# Patient Record
Sex: Male | Born: 1960
Health system: Southern US, Community
[De-identification: ages and names within clinical notes are randomized; demographics above are authoritative.]

## PROBLEM LIST (undated history)

## (undated) ENCOUNTER — Emergency Department (HOSPITAL_COMMUNITY): Discharge status: Absent without leave | Payer: Self-pay

## (undated) DIAGNOSIS — Z972 Presence of dental prosthetic device (complete) (partial): Secondary | ICD-10-CM

## (undated) DIAGNOSIS — Z9889 Other specified postprocedural states: Secondary | ICD-10-CM

## (undated) DIAGNOSIS — M51379 Other intervertebral disc degeneration, lumbosacral region without mention of lumbar back pain or lower extremity pain: Secondary | ICD-10-CM

## (undated) DIAGNOSIS — Z973 Presence of spectacles and contact lenses: Secondary | ICD-10-CM

## (undated) DIAGNOSIS — R918 Other nonspecific abnormal finding of lung field: Secondary | ICD-10-CM

## (undated) DIAGNOSIS — Z8619 Personal history of other infectious and parasitic diseases: Secondary | ICD-10-CM

## (undated) DIAGNOSIS — M5414 Radiculopathy, thoracic region: Secondary | ICD-10-CM

## (undated) DIAGNOSIS — Z8679 Personal history of other diseases of the circulatory system: Secondary | ICD-10-CM

## (undated) DIAGNOSIS — Z8719 Personal history of other diseases of the digestive system: Secondary | ICD-10-CM

## (undated) DIAGNOSIS — M5137 Other intervertebral disc degeneration, lumbosacral region: Secondary | ICD-10-CM

## (undated) DIAGNOSIS — D589 Hereditary hemolytic anemia, unspecified: Secondary | ICD-10-CM

## (undated) DIAGNOSIS — C61 Malignant neoplasm of prostate: Secondary | ICD-10-CM

## (undated) DIAGNOSIS — Z954 Presence of other heart-valve replacement: Secondary | ICD-10-CM

## (undated) HISTORY — DX: Other specified postprocedural states: Z98.890

## (undated) HISTORY — DX: Hereditary hemolytic anemia, unspecified: D58.9

## (undated) HISTORY — PX: COLONOSCOPY: SHX174

## (undated) HISTORY — PX: TRANSTHORACIC ECHOCARDIOGRAM: SHX275

## (undated) HISTORY — PX: CARDIAC CATHETERIZATION: SHX172

---

## 1999-10-23 ENCOUNTER — Encounter: Payer: Self-pay | Admitting: Internal Medicine

## 1999-10-23 ENCOUNTER — Inpatient Hospital Stay (HOSPITAL_COMMUNITY): Admission: EM | Admit: 1999-10-23 | Discharge: 1999-11-02 | Payer: Self-pay | Admitting: Internal Medicine

## 1999-10-29 ENCOUNTER — Encounter: Payer: Self-pay | Admitting: Internal Medicine

## 1999-10-30 ENCOUNTER — Encounter: Payer: Self-pay | Admitting: Urology

## 1999-11-02 ENCOUNTER — Encounter: Payer: Self-pay | Admitting: Internal Medicine

## 2002-09-19 ENCOUNTER — Encounter: Payer: Self-pay | Admitting: Emergency Medicine

## 2002-09-19 ENCOUNTER — Emergency Department (HOSPITAL_COMMUNITY): Admission: EM | Admit: 2002-09-19 | Discharge: 2002-09-19 | Payer: Self-pay | Admitting: Emergency Medicine

## 2004-05-25 ENCOUNTER — Ambulatory Visit: Payer: Self-pay | Admitting: Internal Medicine

## 2004-07-23 ENCOUNTER — Ambulatory Visit: Payer: Self-pay | Admitting: Internal Medicine

## 2004-07-30 ENCOUNTER — Ambulatory Visit: Payer: Self-pay | Admitting: Internal Medicine

## 2004-09-17 ENCOUNTER — Emergency Department (HOSPITAL_COMMUNITY): Admission: EM | Admit: 2004-09-17 | Discharge: 2004-09-17 | Payer: Self-pay | Admitting: Emergency Medicine

## 2005-01-24 ENCOUNTER — Ambulatory Visit: Payer: Self-pay | Admitting: Internal Medicine

## 2005-06-04 ENCOUNTER — Ambulatory Visit: Payer: Self-pay | Admitting: Internal Medicine

## 2005-08-23 ENCOUNTER — Ambulatory Visit: Payer: Self-pay | Admitting: Internal Medicine

## 2005-11-11 ENCOUNTER — Ambulatory Visit: Payer: Self-pay | Admitting: Internal Medicine

## 2005-11-27 ENCOUNTER — Ambulatory Visit: Payer: Self-pay | Admitting: Internal Medicine

## 2006-03-04 ENCOUNTER — Ambulatory Visit: Payer: Self-pay | Admitting: Internal Medicine

## 2007-03-02 ENCOUNTER — Ambulatory Visit: Payer: Self-pay | Admitting: Internal Medicine

## 2007-03-02 DIAGNOSIS — I38 Endocarditis, valve unspecified: Secondary | ICD-10-CM | POA: Insufficient documentation

## 2007-04-01 ENCOUNTER — Telehealth (INDEPENDENT_AMBULATORY_CARE_PROVIDER_SITE_OTHER): Payer: Self-pay | Admitting: *Deleted

## 2007-04-10 ENCOUNTER — Ambulatory Visit: Payer: Self-pay | Admitting: Internal Medicine

## 2007-05-29 ENCOUNTER — Emergency Department (HOSPITAL_COMMUNITY): Admission: EM | Admit: 2007-05-29 | Discharge: 2007-05-29 | Payer: Self-pay | Admitting: Emergency Medicine

## 2007-06-03 ENCOUNTER — Ambulatory Visit: Payer: Self-pay

## 2007-06-03 ENCOUNTER — Encounter: Payer: Self-pay | Admitting: Internal Medicine

## 2007-06-06 ENCOUNTER — Encounter: Payer: Self-pay | Admitting: Internal Medicine

## 2007-06-08 ENCOUNTER — Encounter (INDEPENDENT_AMBULATORY_CARE_PROVIDER_SITE_OTHER): Payer: Self-pay | Admitting: *Deleted

## 2007-06-11 ENCOUNTER — Ambulatory Visit: Payer: Self-pay | Admitting: Cardiology

## 2007-09-24 ENCOUNTER — Ambulatory Visit: Payer: Self-pay | Admitting: Internal Medicine

## 2008-02-08 ENCOUNTER — Ambulatory Visit: Payer: Self-pay | Admitting: Internal Medicine

## 2008-02-09 ENCOUNTER — Ambulatory Visit: Payer: Self-pay | Admitting: Internal Medicine

## 2008-02-10 ENCOUNTER — Telehealth (INDEPENDENT_AMBULATORY_CARE_PROVIDER_SITE_OTHER): Payer: Self-pay | Admitting: *Deleted

## 2008-02-10 LAB — CONVERTED CEMR LAB
BUN: 11 mg/dL (ref 6–23)
Basophils Absolute: 0 10*3/uL (ref 0.0–0.1)
Basophils Relative: 0.5 % (ref 0.0–3.0)
CO2: 27 meq/L (ref 19–32)
Calcium: 8.6 mg/dL (ref 8.4–10.5)
Chloride: 105 meq/L (ref 96–112)
Creatinine, Ser: 1.4 mg/dL (ref 0.4–1.5)
Eosinophils Absolute: 0 10*3/uL (ref 0.0–0.7)
Eosinophils Relative: 0.3 % (ref 0.0–5.0)
GFR calc Af Amer: 70 mL/min
GFR calc non Af Amer: 57 mL/min
Glucose, Bld: 89 mg/dL (ref 70–99)
HCT: 31.5 % — ABNORMAL LOW (ref 39.0–52.0)
Hemoglobin: 10.5 g/dL — ABNORMAL LOW (ref 13.0–17.0)
Lymphocytes Relative: 19.2 % (ref 12.0–46.0)
MCHC: 33.4 g/dL (ref 30.0–36.0)
MCV: 88.7 fL (ref 78.0–100.0)
Monocytes Absolute: 0.7 10*3/uL (ref 0.1–1.0)
Monocytes Relative: 11.2 % (ref 3.0–12.0)
Neutro Abs: 4.4 10*3/uL (ref 1.4–7.7)
Neutrophils Relative %: 68.8 % (ref 43.0–77.0)
Platelets: 333 10*3/uL (ref 150–400)
Potassium: 4 meq/L (ref 3.5–5.1)
RBC: 3.55 M/uL — ABNORMAL LOW (ref 4.22–5.81)
RDW: 13.3 % (ref 11.5–14.6)
Sodium: 137 meq/L (ref 135–145)
WBC: 6.3 10*3/uL (ref 4.5–10.5)

## 2008-02-11 ENCOUNTER — Encounter (INDEPENDENT_AMBULATORY_CARE_PROVIDER_SITE_OTHER): Payer: Self-pay | Admitting: *Deleted

## 2008-02-11 ENCOUNTER — Emergency Department (HOSPITAL_COMMUNITY): Admission: EM | Admit: 2008-02-11 | Discharge: 2008-02-11 | Payer: Self-pay | Admitting: *Deleted

## 2008-02-16 ENCOUNTER — Telehealth (INDEPENDENT_AMBULATORY_CARE_PROVIDER_SITE_OTHER): Payer: Self-pay | Admitting: *Deleted

## 2008-02-16 DIAGNOSIS — IMO0002 Reserved for concepts with insufficient information to code with codable children: Secondary | ICD-10-CM | POA: Insufficient documentation

## 2008-02-17 ENCOUNTER — Emergency Department (HOSPITAL_COMMUNITY): Admission: EM | Admit: 2008-02-17 | Discharge: 2008-02-17 | Payer: Self-pay | Admitting: Emergency Medicine

## 2008-02-18 ENCOUNTER — Telehealth (INDEPENDENT_AMBULATORY_CARE_PROVIDER_SITE_OTHER): Payer: Self-pay | Admitting: *Deleted

## 2008-02-22 ENCOUNTER — Encounter: Payer: Self-pay | Admitting: Internal Medicine

## 2008-03-08 ENCOUNTER — Ambulatory Visit: Payer: Self-pay | Admitting: Internal Medicine

## 2008-03-08 DIAGNOSIS — D649 Anemia, unspecified: Secondary | ICD-10-CM | POA: Insufficient documentation

## 2008-03-09 ENCOUNTER — Telehealth (INDEPENDENT_AMBULATORY_CARE_PROVIDER_SITE_OTHER): Payer: Self-pay | Admitting: *Deleted

## 2008-03-09 ENCOUNTER — Encounter (INDEPENDENT_AMBULATORY_CARE_PROVIDER_SITE_OTHER): Payer: Self-pay | Admitting: *Deleted

## 2008-03-09 ENCOUNTER — Ambulatory Visit: Payer: Self-pay | Admitting: Internal Medicine

## 2008-03-09 LAB — CONVERTED CEMR LAB
ALT: 16 units/L (ref 0–53)
AST: 13 units/L (ref 0–37)
Albumin: 2.7 g/dL — ABNORMAL LOW (ref 3.5–5.2)
Alkaline Phosphatase: 64 units/L (ref 39–117)
Basophils Absolute: 0 10*3/uL (ref 0.0–0.1)
Basophils Relative: 0.5 % (ref 0.0–3.0)
Bilirubin, Direct: 0.1 mg/dL (ref 0.0–0.3)
Eosinophils Absolute: 0 10*3/uL (ref 0.0–0.7)
Eosinophils Relative: 0.5 % (ref 0.0–5.0)
Folate: 6.6 ng/mL
HCT: 32.5 % — ABNORMAL LOW (ref 39.0–52.0)
Hemoglobin: 10.7 g/dL — ABNORMAL LOW (ref 13.0–17.0)
Iron: 32 ug/dL — ABNORMAL LOW (ref 42–165)
Lymphocytes Relative: 17.3 % (ref 12.0–46.0)
MCHC: 32.9 g/dL (ref 30.0–36.0)
MCV: 85.1 fL (ref 78.0–100.0)
Monocytes Absolute: 0.5 10*3/uL (ref 0.1–1.0)
Monocytes Relative: 6.8 % (ref 3.0–12.0)
Neutro Abs: 6 10*3/uL (ref 1.4–7.7)
Neutrophils Relative %: 74.9 % (ref 43.0–77.0)
Platelets: 261 10*3/uL (ref 150–400)
RBC: 3.82 M/uL — ABNORMAL LOW (ref 4.22–5.81)
RDW: 14.4 % (ref 11.5–14.6)
Saturation Ratios: 12.5 % — ABNORMAL LOW (ref 20.0–50.0)
Total Bilirubin: 0.6 mg/dL (ref 0.3–1.2)
Total Protein: 7 g/dL (ref 6.0–8.3)
Transferrin: 183.3 mg/dL — ABNORMAL LOW (ref >=212.0)
Vitamin B-12: 397 pg/mL (ref 211–911)
WBC: 7.8 10*3/uL (ref 4.5–10.5)

## 2008-03-11 ENCOUNTER — Ambulatory Visit: Payer: Self-pay | Admitting: Cardiology

## 2008-03-11 ENCOUNTER — Inpatient Hospital Stay (HOSPITAL_COMMUNITY): Admission: EM | Admit: 2008-03-11 | Discharge: 2008-03-26 | Payer: Self-pay | Admitting: Cardiology

## 2008-03-12 ENCOUNTER — Ambulatory Visit: Payer: Self-pay | Admitting: Infectious Diseases

## 2008-03-12 ENCOUNTER — Encounter: Payer: Self-pay | Admitting: Cardiology

## 2008-03-14 ENCOUNTER — Encounter: Payer: Self-pay | Admitting: Internal Medicine

## 2008-03-14 ENCOUNTER — Encounter: Payer: Self-pay | Admitting: Thoracic Surgery (Cardiothoracic Vascular Surgery)

## 2008-03-15 ENCOUNTER — Telehealth (INDEPENDENT_AMBULATORY_CARE_PROVIDER_SITE_OTHER): Payer: Self-pay | Admitting: *Deleted

## 2008-03-15 ENCOUNTER — Ambulatory Visit: Payer: Self-pay | Admitting: Dentistry

## 2008-03-15 ENCOUNTER — Encounter: Payer: Self-pay | Admitting: Thoracic Surgery (Cardiothoracic Vascular Surgery)

## 2008-03-15 ENCOUNTER — Ambulatory Visit: Payer: Self-pay | Admitting: Thoracic Surgery (Cardiothoracic Vascular Surgery)

## 2008-03-16 ENCOUNTER — Ambulatory Visit: Payer: Self-pay | Admitting: Dentistry

## 2008-03-16 HISTORY — PX: MULTIPLE TOOTH EXTRACTIONS: SHX2053

## 2008-03-22 ENCOUNTER — Encounter: Payer: Self-pay | Admitting: Thoracic Surgery (Cardiothoracic Vascular Surgery)

## 2008-03-22 HISTORY — PX: MITRAL VALVE REPAIR: SHX2039

## 2008-03-30 ENCOUNTER — Ambulatory Visit: Payer: Self-pay | Admitting: Internal Medicine

## 2008-03-31 ENCOUNTER — Inpatient Hospital Stay (HOSPITAL_COMMUNITY): Admission: EM | Admit: 2008-03-31 | Discharge: 2008-04-02 | Payer: Self-pay | Admitting: Emergency Medicine

## 2008-04-01 ENCOUNTER — Encounter (INDEPENDENT_AMBULATORY_CARE_PROVIDER_SITE_OTHER): Payer: Self-pay | Admitting: Emergency Medicine

## 2008-04-04 ENCOUNTER — Ambulatory Visit: Payer: Self-pay | Admitting: Thoracic Surgery (Cardiothoracic Vascular Surgery)

## 2008-04-04 ENCOUNTER — Encounter
Admission: RE | Admit: 2008-04-04 | Discharge: 2008-04-04 | Payer: Self-pay | Admitting: Thoracic Surgery (Cardiothoracic Vascular Surgery)

## 2008-04-07 ENCOUNTER — Ambulatory Visit: Payer: Self-pay | Admitting: Cardiology

## 2008-04-08 ENCOUNTER — Ambulatory Visit: Payer: Self-pay | Admitting: Internal Medicine

## 2008-04-18 ENCOUNTER — Ambulatory Visit: Payer: Self-pay | Admitting: Cardiology

## 2008-04-18 LAB — CONVERTED CEMR LAB
INR: 4.4 — ABNORMAL HIGH (ref 0.8–1.0)
Prothrombin Time: 44 s — ABNORMAL HIGH (ref 10.9–13.3)

## 2008-04-25 ENCOUNTER — Encounter: Payer: Self-pay | Admitting: Internal Medicine

## 2008-04-28 ENCOUNTER — Ambulatory Visit: Payer: Self-pay | Admitting: Internal Medicine

## 2008-05-02 ENCOUNTER — Encounter: Payer: Self-pay | Admitting: Internal Medicine

## 2008-05-09 ENCOUNTER — Ambulatory Visit: Payer: Self-pay | Admitting: Cardiology

## 2008-05-23 ENCOUNTER — Ambulatory Visit: Payer: Self-pay | Admitting: Thoracic Surgery (Cardiothoracic Vascular Surgery)

## 2008-06-29 DIAGNOSIS — I059 Rheumatic mitral valve disease, unspecified: Secondary | ICD-10-CM | POA: Insufficient documentation

## 2008-07-15 ENCOUNTER — Encounter: Payer: Self-pay | Admitting: Cardiology

## 2008-07-15 ENCOUNTER — Ambulatory Visit: Payer: Self-pay | Admitting: Cardiology

## 2008-09-27 ENCOUNTER — Encounter: Payer: Self-pay | Admitting: *Deleted

## 2008-10-17 ENCOUNTER — Telehealth (INDEPENDENT_AMBULATORY_CARE_PROVIDER_SITE_OTHER): Payer: Self-pay | Admitting: *Deleted

## 2008-10-18 ENCOUNTER — Ambulatory Visit: Payer: Self-pay | Admitting: Cardiology

## 2008-11-02 ENCOUNTER — Encounter: Payer: Self-pay | Admitting: *Deleted

## 2008-11-23 ENCOUNTER — Encounter: Payer: Self-pay | Admitting: Cardiology

## 2008-11-23 ENCOUNTER — Ambulatory Visit: Payer: Self-pay | Admitting: Thoracic Surgery (Cardiothoracic Vascular Surgery)

## 2009-02-08 ENCOUNTER — Ambulatory Visit: Payer: Self-pay | Admitting: Internal Medicine

## 2009-03-04 IMAGING — CR DG CHEST 2V
2 series · 2 of 2 positions shown · non-contrast
Comparison: 03/09/2008.

CLINICAL DATA: Fever.  History of endocarditis.

CHEST - 2 VIEW

[w chest pa]
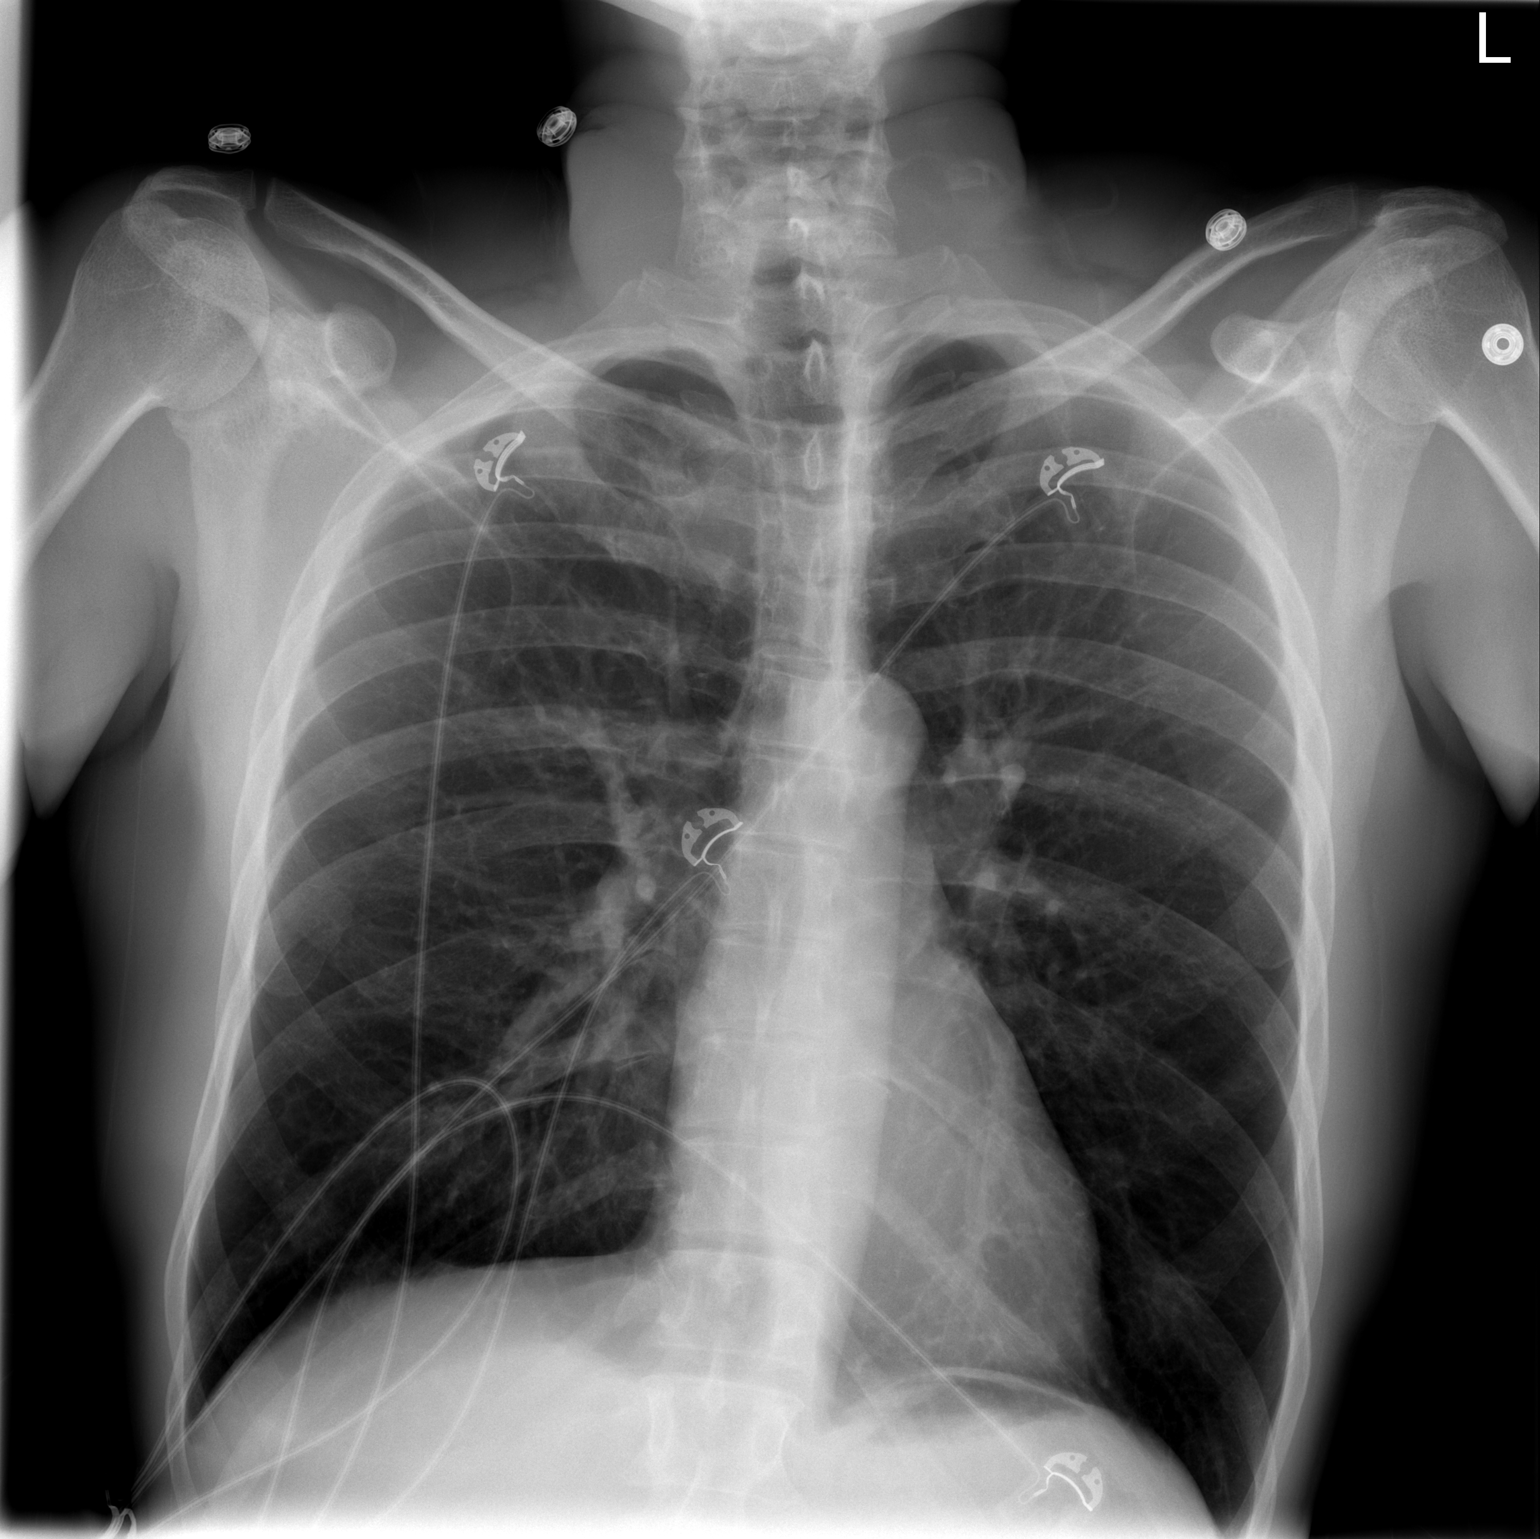

[w chest lat]
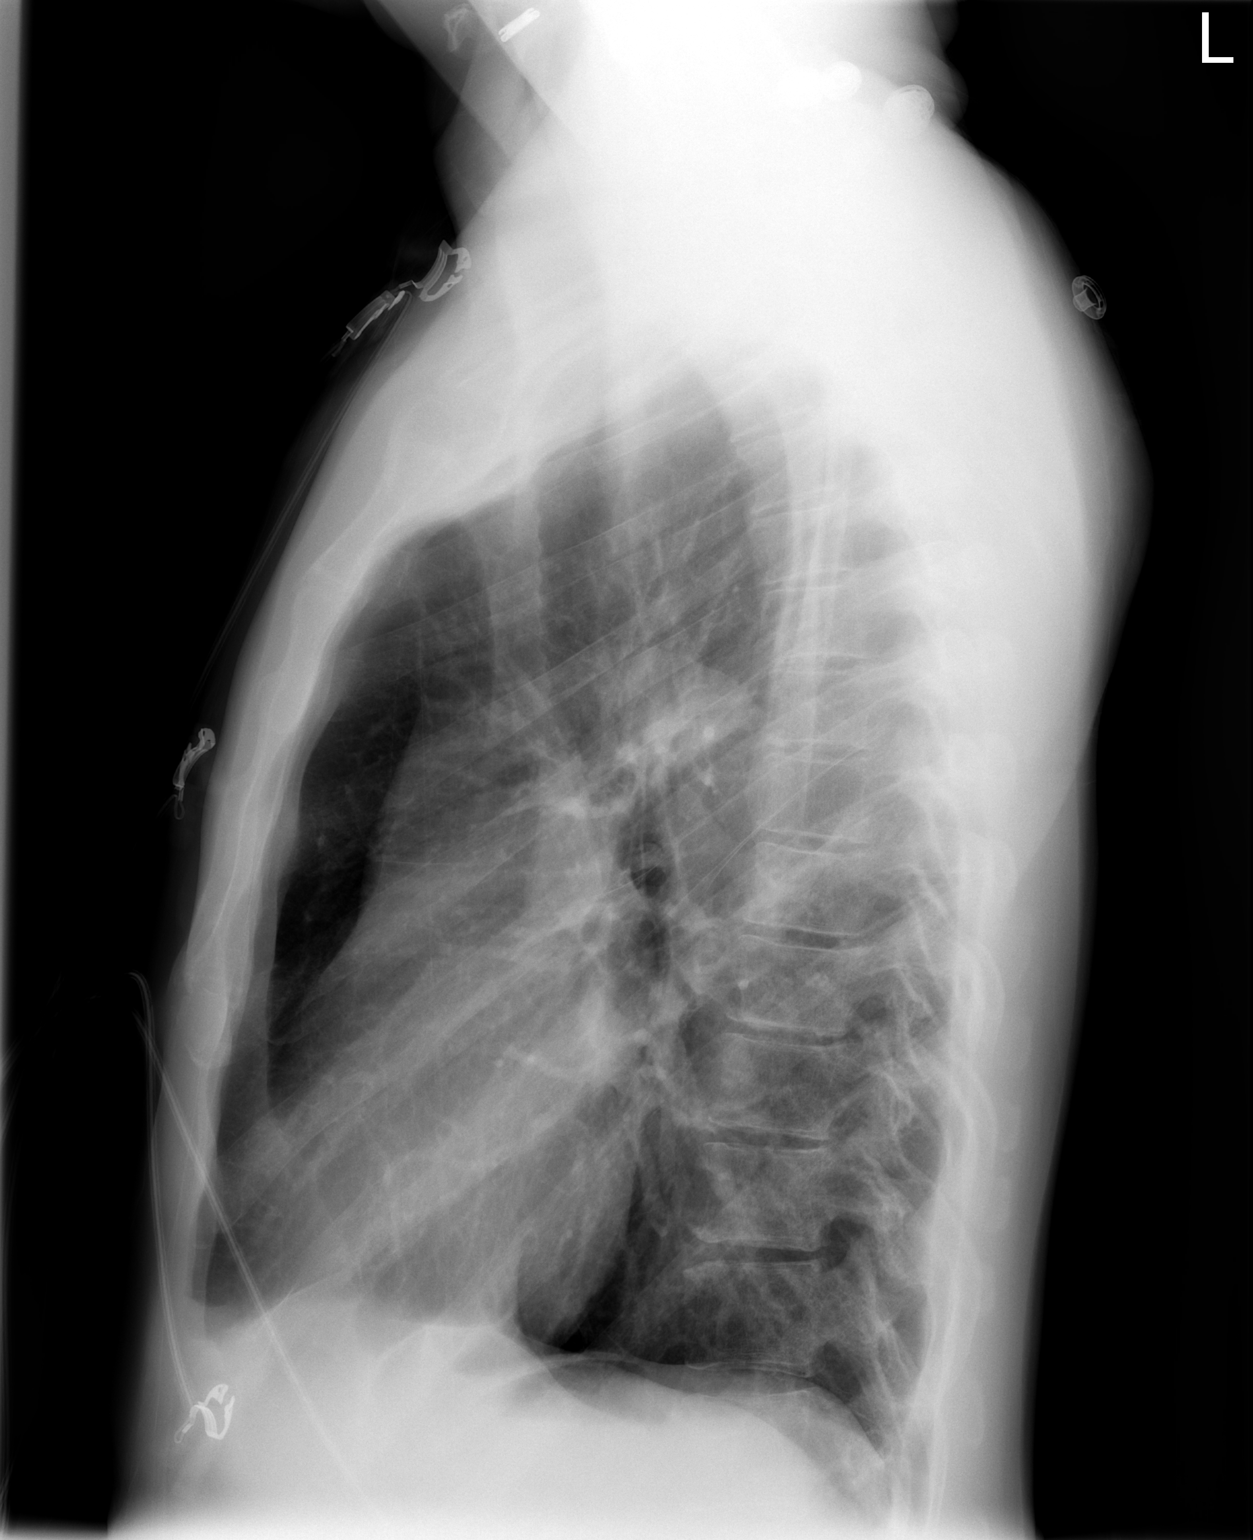

[2 of 2 positions shown; findings below may reference images not displayed]

FINDINGS: Stable normal sized heart and clear lungs.  The lungs
remain hyperexpanded.  Minimal scoliosis.
IMPRESSION: Stable mild changes of COPD.  No acute abnormality.

## 2009-03-16 ENCOUNTER — Ambulatory Visit: Payer: Self-pay | Admitting: Internal Medicine

## 2009-05-04 ENCOUNTER — Ambulatory Visit: Payer: Self-pay | Admitting: Internal Medicine

## 2009-05-12 ENCOUNTER — Ambulatory Visit: Payer: Self-pay | Admitting: Internal Medicine

## 2009-05-12 DIAGNOSIS — K449 Diaphragmatic hernia without obstruction or gangrene: Secondary | ICD-10-CM | POA: Insufficient documentation

## 2009-06-16 ENCOUNTER — Telehealth (INDEPENDENT_AMBULATORY_CARE_PROVIDER_SITE_OTHER): Payer: Self-pay | Admitting: *Deleted

## 2009-10-19 ENCOUNTER — Ambulatory Visit: Payer: Self-pay | Admitting: Cardiology

## 2009-11-03 ENCOUNTER — Ambulatory Visit (HOSPITAL_COMMUNITY): Admission: RE | Admit: 2009-11-03 | Discharge: 2009-11-03 | Payer: Self-pay | Admitting: Cardiology

## 2009-11-03 ENCOUNTER — Ambulatory Visit: Payer: Self-pay | Admitting: Cardiovascular Disease

## 2009-11-03 ENCOUNTER — Ambulatory Visit: Payer: Self-pay

## 2009-12-13 ENCOUNTER — Ambulatory Visit: Payer: Self-pay | Admitting: Internal Medicine

## 2010-01-12 ENCOUNTER — Ambulatory Visit: Payer: Self-pay | Admitting: Internal Medicine

## 2010-01-12 DIAGNOSIS — E785 Hyperlipidemia, unspecified: Secondary | ICD-10-CM | POA: Insufficient documentation

## 2010-01-12 DIAGNOSIS — K219 Gastro-esophageal reflux disease without esophagitis: Secondary | ICD-10-CM | POA: Insufficient documentation

## 2010-01-12 LAB — CONVERTED CEMR LAB
Cholesterol, target level: 200 mg/dL
HDL goal, serum: 40 mg/dL
LDL Goal: 160 mg/dL

## 2010-01-15 LAB — CONVERTED CEMR LAB
ALT: 28 units/L (ref 0–53)
AST: 35 units/L (ref 0–37)
Albumin: 4.7 g/dL (ref 3.5–5.2)
Alkaline Phosphatase: 49 units/L (ref 39–117)
BUN: 11 mg/dL (ref 6–23)
Basophils Absolute: 0 10*3/uL (ref 0.0–0.1)
Basophils Relative: 0.5 % (ref 0.0–3.0)
Bilirubin, Direct: 0.2 mg/dL (ref 0.0–0.3)
CO2: 30 meq/L (ref 19–32)
Calcium: 9.8 mg/dL (ref 8.4–10.5)
Chloride: 105 meq/L (ref 96–112)
Cholesterol: 225 mg/dL — ABNORMAL HIGH (ref 0–200)
Creatinine, Ser: 1 mg/dL (ref 0.4–1.5)
Direct LDL: 148.7 mg/dL
Eosinophils Absolute: 0 10*3/uL (ref 0.0–0.7)
Eosinophils Relative: 1.9 % (ref 0.0–5.0)
GFR calc non Af Amer: 106.57 mL/min (ref >60)
Glucose, Bld: 87 mg/dL (ref 70–99)
HCT: 38.7 % — ABNORMAL LOW (ref 39.0–52.0)
HDL: 68.7 mg/dL (ref >39.00)
Hemoglobin: 12.7 g/dL — ABNORMAL LOW (ref 13.0–17.0)
Lymphocytes Relative: 48.8 % — ABNORMAL HIGH (ref 12.0–46.0)
Lymphs Abs: 1.2 10*3/uL (ref 0.7–4.0)
MCHC: 33 g/dL (ref 30.0–36.0)
MCV: 92.6 fL (ref 78.0–100.0)
Monocytes Absolute: 0.2 10*3/uL (ref 0.1–1.0)
Monocytes Relative: 10.4 % (ref 3.0–12.0)
Neutro Abs: 0.9 10*3/uL — ABNORMAL LOW (ref 1.4–7.7)
Neutrophils Relative %: 38.4 % — ABNORMAL LOW (ref 43.0–77.0)
PSA: 1.4 ng/mL (ref 0.10–4.00)
Platelets: 198 10*3/uL (ref 150.0–400.0)
Potassium: 4.9 meq/L (ref 3.5–5.1)
RBC: 4.18 M/uL — ABNORMAL LOW (ref 4.22–5.81)
RDW: 14.9 % — ABNORMAL HIGH (ref 11.5–14.6)
Sodium: 142 meq/L (ref 135–145)
TSH: 1.5 microintl units/mL (ref 0.35–5.50)
Total Bilirubin: 0.7 mg/dL (ref 0.3–1.2)
Total CHOL/HDL Ratio: 3
Total Protein: 7.6 g/dL (ref 6.0–8.3)
Triglycerides: 47 mg/dL (ref 0.0–149.0)
VLDL: 9.4 mg/dL (ref 0.0–40.0)
WBC: 2.4 10*3/uL — ABNORMAL LOW (ref 4.5–10.5)

## 2010-01-18 ENCOUNTER — Encounter: Payer: Self-pay | Admitting: Internal Medicine

## 2010-02-05 ENCOUNTER — Encounter: Payer: Self-pay | Admitting: Internal Medicine

## 2010-02-27 ENCOUNTER — Encounter: Payer: Self-pay | Admitting: Internal Medicine

## 2010-04-16 ENCOUNTER — Ambulatory Visit: Payer: Self-pay | Admitting: Internal Medicine

## 2010-04-16 DIAGNOSIS — R51 Headache: Secondary | ICD-10-CM | POA: Insufficient documentation

## 2010-04-16 DIAGNOSIS — R519 Headache, unspecified: Secondary | ICD-10-CM | POA: Insufficient documentation

## 2010-05-14 ENCOUNTER — Telehealth (INDEPENDENT_AMBULATORY_CARE_PROVIDER_SITE_OTHER): Payer: Self-pay | Admitting: *Deleted

## 2010-05-20 ENCOUNTER — Encounter: Payer: Self-pay | Admitting: Internal Medicine

## 2010-05-29 NOTE — Assessment & Plan Note (Signed)
Summary: NIGHT SWEATS//VGJ   Vital Signs:  Patient Profile:   50 Years Old Male Weight:      143.8 pounds Temp:     98.1 degrees F oral Pulse rate:   88 / minute Resp:     18 per minute BP sitting:   128 / 74  (left arm) Cuff size:   regular  Vitals Entered By: Shonna Chock (March 08, 2008 12:15 PM)                 Chief Complaint:  1.) Ongoing concern-night sweats  2.) Patient can hear drums when he is in a quite setting .  History of Present Illness: Mainly night sweats since 02/07/08, minimal day symptoms. Occa  "bone chilling cold". Sweats preceded narcotic pain meds. PMH of endocarditis; he saw Dr [name] in 2/09.  DrDalton- Eduard Clos & Dr Ludwig Clarks notes reviewed. Low back radiculitis, esp S1. No abscess on MRI of spine . Moderate MR on 2D ECHO 2/09. He takes ibuprofen q 6 hrs for back .See ROS    Current Allergies (reviewed today): ! BIAXIN     Review of Systems  General      Complains of chills and sweats.      Denies fever.      Weight loss of 15 # post back injury  ENT      Complains of ringing in ears.      Denies earache, nasal congestion, and sinus pressure.      No facial pain,frontal ha, nasal purulence  CV      Denies bluish discoloration of lips or nails, difficulty breathing at night, difficulty breathing while lying down, and palpitations.  Resp      Denies chest pain with inspiration, cough, coughing up blood, pleuritic, and sputum productive.  GI      Denies diarrhea.  GU      Denies discharge, hematuria, incontinence, and urinary frequency.  MS      Complains of low back pain.      Denies joint redness and joint swelling.      L4-S1 "pinched nerve, bulging discs". In PT   Derm      Denies changes in color of skin, lesion(s), and rash.  Neuro      see NS diagnoses  Heme      Denies abnormal bruising and bleeding.   Physical Exam  General:     in no acute distress; alert,appropriate and cooperative throughout  examination. Actual shivering intermittently Eyes:     No corneal or conjunctival inflammation noted.[name] Perrla.Arcus bilat. No conjunctival hemorrhages Ears:     External ear exam shows no significant lesions or deformities.  Otoscopic examination reveals clear canals, tympanic membranes are intact bilaterally without bulging, retraction, inflammation or discharge. Hearing is grossly normal bilaterally. Nose:     External nasal examination shows no deformity or inflammation. Nasal mucosa are pink and moist without lesions or exudates.Scar ant nose Mouth:     Oral mucosa and oropharynx without lesions or exudates.  Teeth in good repair. Neck:     No deformities, masses, or tenderness noted. Lungs:     Normal respiratory effort, chest expands symmetrically. Lungs are clear to auscultation, no crackles or wheezes. Heart:     normal rate, regular rhythm, no gallop, no rub, no JVD, no HJR, and grade 3 /6 systolic murmur @ apex.   Abdomen:     Bowel sounds positive,abdomen soft and non-tender without masses, organomegaly or hernias noted.Scaphoid Msk:  Lumbar curve reversed Pulses:     R and L carotid,radial,dorsalis pedis and posterior tibial pulses are full and equal bilaterally Extremities:     No clubbing, cyanosis, edema, or deformity noted . Moves gingerly due to back pain.No nail bed hemorrhages Neurologic:     alert & oriented X3.   Skin:     Intact without suspicious lesions or rashes Cervical Nodes:     No lymphadenopathy noted Axillary Nodes:     No palpable lymphadenopathy    Impression & Recommendations:  Problem # 1:  NIGHT SWEATS (ICD-780.8)  Orders: Venipuncture (16109) TLB-Hepatic/Liver Function Pnl (80076-HEPATIC) TLB-Udip w/ Micro (81001-URINE) T-Urine Culture (Spectrum Order) ([id number]) T-2 View CXR (91478GN) Cardiology Referral (Cardiology)   Problem # 2:  OTHER GENERAL SYMPTOMS (ICD-780.99) rigor Orders: Venipuncture (56213) TLB-CBC Platelet -  w/Differential (85025-CBCD) TLB-Hepatic/Liver Function Pnl (80076-HEPATIC) TLB-Udip w/ Micro (81001-URINE) T-Urine Culture (Spectrum Order) ([id number]) Cardiology Referral (Cardiology)   Problem # 3:  OTHER ENDOCARDITIS VALVE UNSPECIFIED (ICD-424.99)  Orders: Venipuncture (29528) T-2 View CXR (71020TC) Cardiology Referral (Cardiology)   Problem # 4:  BACK PAIN, LUMBAR, WITH RADICULOPATHY (ICD-724.4) Low back radiculitis The following medications were removed from the medication list:    Endocet 5-325 Mg Tabs (Oxycodone-acetaminophen) .[name]... 1-2 by mouth every 6 as needed  His updated medication list for this problem includes:    Advil 200 Mg Tabs (Ibuprofen) .[name]... 1 by mouth once daily as needed   Problem # 5:  UNSPECIFIED ANEMIA (ICD-285.9)  Orders: TLB-CBC Platelet - w/Differential (85025-CBCD) TLB-B12 + Folate Pnl (41324_40102-V25/DGU) TLB-IBC Pnl (Iron/FE;Transferrin) (83550-IBC)   Complete Medication List: 1)  Advil 200 Mg Tabs (Ibuprofen) .[name].. 1 by mouth once daily as needed   Patient Instructions: 1)  Keep temp diary.   ]  Appended Document: NIGHT SWEATS//VGJ

## 2010-05-29 NOTE — Progress Notes (Signed)
Summary: lab results  Phone Note Outgoing Call   Details for Reason: LAB RESULTS: I review the old chart, his previous hemoglobins had always been normal around 14. Due to rectal bleeding,he  had a colonoscopy in 2005 by Dr. Jarold Motto and it was normal except for hemorrhoids Plan: Check a B12, folic acid, iron/ferritin, peripheral smear,  Dx anemia  consider  GI versus hematology referral Signed by Nolon Rod. Paz MD on 02/10/2008 at 8:29 AM  Summary of Call: DISCUSSED WITH PT ..........Marland KitchenShary Decamp  February 10, 2008 11:49 AM

## 2010-05-29 NOTE — Progress Notes (Signed)
Summary: Triage Call-Temp log  Phone Note Call from Patient Call back at Home Phone [phone]   Caller: Patient Summary of Call: MESSAGE LEFT ON VOICEMAIL: PATIENT CALLED TO LEAVE TEMP LOG-  3:30AM-102.2 4:00AM-101.2 5:00AM-BELOW 100 7:00AM: 97.0  PATIENT STILL WITH NIGHT SWEATS, DR.HOPPER PLEASE ADVISE./Chrae Malloy  March 09, 2008 1:39 PM   Follow-up for Phone Call        Although  the white blood count & chest Xray  are normal & the   fever has resolved, the pattern is worrisome. Continue temp monitor & see me 03/10/08 if fever recurs overnight. Mild anemia present with low iron levels; stool cards are indicated. Follow-up by: Marga Melnick MD,  March 09, 2008 2:27 PM  Additional Follow-up for Phone Call Additional follow up Details #1::        Spoke with pt informed him of Dr Caryl Never recommendations and informed him of CXR results pt wants labs mailed to him .Kandice Hams  March 09, 2008 5:04 PM  Additional Follow-up by: Kandice Hams,  March 09, 2008 5:04 PM

## 2010-05-29 NOTE — Assessment & Plan Note (Signed)
Summary: RASH ON BACK OF LEGS AND ARMS/KDC   Vital Signs:  Patient profile:   50 year old male Weight:      163.2 pounds BMI:     22.84 Temp:     97.7 degrees F oral Pulse rate:   76 / minute Resp:     16 per minute BP sitting:   126 / 78  (left arm) Cuff size:   regular  Vitals Entered By: Shonna Chock (February 08, 2009 1:54 PM) CC: Rash off/on on the back of legs and arms x 3 weeks Comments REVIEWED MED LIST, PATIENT AGREED DOSE AND INSTRUCTION CORRECT    Primary Care Provider:  dr Quintella Reichert  CC:  Rash off/on on the back of legs and arms x 3 weeks.  History of Present Illness: Intermittent pruritic rash as welts  on legs  & arms, especially in evening  responsive to Benadryl for 2 weeks.  ? trigger as trail mix eaten in evening.Allegra Rxed for this in 2007. No allergies except " some weed"; GI Biaxin intolerance. Not on ACE-I  Allergies: 1)  ! Biaxin  Review of Systems General:  Denies chills, fever, and sweats. ENT:  Denies nasal congestion, postnasal drainage, and sinus pressure. Resp:  Denies cough, sputum productive, and wheezing. Derm:  See HPI. Allergy:  Denies itching eyes and sneezing; No angioedema of lips/ tongue.  Physical Exam  General:  well-nourished,in no acute distress; alert,appropriate and cooperative throughout examination Nose:  External nasal examination shows no deformity or inflammation. Nasal mucosa are pink and moist without lesions or exudates. Mouth:  Oral mucosa and oropharynx without lesions or exudates.  Missing anterior teeth  Abdomen:  Bowel sounds positive,abdomen soft and non-tender without masses, organomegaly or hernias noted. Skin:  Intact without suspicious lesions or rashes. Very minimal dermatographia Cervical Nodes:  No lymphadenopathy noted Axillary Nodes:  No palpable lymphadenopathy   Impression & Recommendations:  Problem # 1:  URTICARIA (ICD-708.9)  Complete Medication List: 1)  Metoprolol Succinate 25 Mg Xr24h-tab  (Metoprolol succinate) .[name].. 1 by mouth once daily 2)  Aspirin Ec 325 Mg Tbec (Aspirin) .... Take one tablet by mouth daily 3)  Zantac (ranitidine Hcl)  .... As needed 4)  Fexofenadine Hcl 180 Mg Tabs (Fexofenadine hcl) .[name].. 1 once daily as needed  Patient Instructions: 1)  Keep Diary of possible triggers. report any lip/tongue swelling Prescriptions: FEXOFENADINE HCL 180 MG TABS (FEXOFENADINE HCL) 1 once daily as needed  #30 x 11   Entered and Authorized by:   Marga Melnick MD   Signed by:   Marga Melnick MD on 02/08/2009   Method used:   Print then Give to Patient   RxID:   [id number]

## 2010-05-29 NOTE — Letter (Signed)
Summary: [facility] Note  [facility] Note   Imported By: Roderic Ovens 12/01/2008 13:28:24  _____________________________________________________________________  External Attachment:    Type:   Image     Comment:   External Document

## 2010-05-29 NOTE — Progress Notes (Signed)
Summary: Urine Culture Results  Phone Note Call from Patient Call back at Home Phone [phone]   Caller: Patient Details for Reason: URINE CULTURE RESULTS Summary of Call: Left message on machine informing patient Urine Culture was Neg. Shonna Chock  March 15, 2008 2:42 PM

## 2010-05-29 NOTE — Progress Notes (Signed)
Summary: endocet rx  Phone Note From Pharmacy   Caller: [facility]  [address] [phone]* Summary of Call: ENDOCET IS A CLASS 2 DRUG AND CANNOT BE FAXED TO [facility] PT.  LEFT MSG FOR PT RX AT [facility] TO PICK UP.Kandice Hams  February 18, 2008 3:23 PM   Initial call taken by: Kandice Hams,  February 18, 2008 3:23 PM

## 2010-05-29 NOTE — Miscellaneous (Signed)
Summary: Orders Update  Clinical Lists Changes  Orders: Added new Referral order of Cardiology Referral (Cardiology) - Signed 

## 2010-05-29 NOTE — Assessment & Plan Note (Signed)
Summary: F/U [facility] ED BACK PAIN/CBS   Vital Signs:  Patient Profile:   50 Years Old Male Weight:      143.25 pounds Pulse rate:   84 / minute BP sitting:   100 / 60  Vitals Entered By: Kandice Hams (February 22, 2008 12:23 PM)                 Chief Complaint:  c/o severe back pain and pt took last Endocet this am.  History of Present Illness: He had to leave before being seen. No charge. No copay was made.    Current Allergies: ! BIAXIN        Complete Medication List: 1)  Endocet 5-325 Mg Tabs (Oxycodone-acetaminophen) .[name].. 1-2 by mouth every 6 as needed 2)  Excedrin Back and Pain    Patient Instructions: 1)  Please see Dr Eduard Clos as scheduled.   ]

## 2010-05-29 NOTE — Letter (Signed)
Summary: Cancer Screening/[facility] Personalized Risk Profile  Cancer Screening/[facility] Personalized Risk Profile   Imported By: Lanelle Bal 01/26/2010 13:54:49  _____________________________________________________________________  External Attachment:    Type:   Image     Comment:   External Document

## 2010-05-29 NOTE — Assessment & Plan Note (Signed)
Summary: serve stomach pain and nausea--PH   Vital Signs:  Patient Profile:   50 Years Old Male Weight:      159.38 pounds Temp:     98.4 degrees F oral Pulse rate:   78 / minute Resp:     16 per minute BP sitting:   100 / 72  (right arm)  Pt. in pain?   no  Vitals Entered By: Ardyth Man (Sep 24, 2007 4:14 PM)                  Chief Complaint:  lots of gas, bloating, and stomach tight last night and this morning.  History of Present Illness: Onset as bloating 5/23;  better with a laxative. Recurrent bloating until he took Zantac 150 mg OTC.No PMH or FH of GI disease. Zantac Rxed for dyspepsia 2 years ago.    Current Allergies: ! BIAXIN     Review of Systems  General      Denies chills, fever, sweats, and weight loss.  GI      See HPI      Complains of abdominal pain, gas, loss of appetite, and nausea.      Denies bloody stools, constipation, dark tarry stools, diarrhea, excessive appetite, indigestion, vomiting, and vomiting blood.   Physical Exam  General:     Well-developed,well-nourished,in no acute distress; alert,appropriate and cooperative throughout examination Eyes:     No corneal or conjunctival inflammation noted.  Perrla.No icterus. Arcus bilat Mouth:     Oral mucosa and oropharynx without lesions or exudates.  Teeth in good repair.No erythema Lungs:     Normal respiratory effort, chest expands symmetrically. Lungs are clear to auscultation, no crackles or wheezes. Heart:     normal rate, regular rhythm, no gallop, no rub, no JVD, no HJR, and grade  3/6 systolic murmur with abd radiation.   Abdomen:     Bowel sounds positive,abdomen soft and non-tender without masses, organomegaly or hernias noted. Skin:     Intact without suspicious lesions or rashes Cervical Nodes:     No lymphadenopathy noted Axillary Nodes:     No palpable lymphadenopathy    Impression & Recommendations:  Problem # 1:  ABDOMINAL PAIN, LEFT UPPER QUADRANT  (ICD-789.02)  Complete Medication List: 1)  Allegra 180 Mg Tabs (Fexofenadine hcl) .[name].. 1 by mouth once daily 2)  Zantac 150 Maximum Strength 150 Mg Tabs (Ranitidine hcl) .[name].. 1 two times a day   Patient Instructions: 1)  Avoid foods high in acid (tomatoes, citrus juices, spicy foods). Avoid eating within two hours of lying down or before exercising. Do not over eat; try smaller more frequent meals. Elevate head of bed twelve inches when sleeping.Complete stool cards   Prescriptions: ZANTAC 150 MAXIMUM STRENGTH 150 MG  TABS (RANITIDINE HCL) 1 two times a day  #60 x 2   Entered and Authorized by:   Marga Melnick MD   Signed by:   Marga Melnick MD on 09/24/2007   Method used:   Print then Give to Patient   RxID:   [id number]  ]

## 2010-05-29 NOTE — Assessment & Plan Note (Signed)
Summary: 6 MO F/Uper pt call arregular heart beat/lg   Primary Provider:  dr Quintella Reichert  CC:  pt complians of chest pains.  History of Present Illness: 50 year old male with past medical history of endocarditis. In November of 2009 he had mitral valve repair  via a mini thoracotomy. An echocardiogram was performed in December following his procedure that showed an ejection fraction of 45% with mild mitral stenosis but no significant mitral regurgitation. Also note he had a catheterization prior to his surgery that showed normal coronary arteries. I last saw him in March of this year. Since I last saw him he denies any dyspnea on exertion, orthopnea, PND, pedal edema, palpitations, syncope. He has had occasional chest pain and his primary care physician asked that we evaluate. The pain is in various locations in his chest. It is "sharp". It lasts for several minutes and resolve spontaneously. He typically notices after eating certain foods. It is not exertional or pleuritic. No associated nausea, vomiting, short of breath or diaphoresis. It resolved spontaneously.  Current Medications (verified): 1)  Metoprolol Succinate 25 Mg Xr24h-Tab (Metoprolol Succinate) .[name].. 1 By Mouth Once Daily 2)  Aspirin Ec 325 Mg Tbec (Aspirin) .... Take One Tablet By Mouth Daily 3)  Zantac  (Ranitidine Hcl) .... As Needed  Allergies: 1)  ! Biaxin  Past History:  Past Medical History: Reviewed history from 06/29/2008 and no changes required. 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   Past Surgical History: Reviewed history from 06/29/2008 and no changes required. 03/22/08  Right miniature thoracotomy for mitral valve repair (Core-   Matrix patch reconstruction of the anterior leaflet, artificial Gore-Tex   chord replacement with plication of flail posterior  leaflet, #26-mm   Edwards Physio II ring annuloplasty).   Social History: Reviewed history from 06/29/2008 and no changes required. Tobacco Use - No.  Alcohol Use - no Drug Use - no  Review of Systems       no fevers or chills, productive cough, hemoptysis, dysphasia, odynophagia, melena, hematochezia, dysuria, hematuria, rash, seizure activity, orthopnea, PND, pedal edema, claudication. Remaining systems are negative.   Vital Signs:  Patient profile:   50 year old male Height:      71 inches Weight:      167 pounds BMI:     23.38 Pulse rate:   85 / minute BP sitting:   134 / 80  (left arm)  Vitals Entered By: Kem Parkinson (October 18, 2008 9:52 AM)  Physical Exam  General:  well-developed well-nourished in no acute distress. Skin is warm and dry. Head:  HEENT is normal. Neck:  supple with no bruits and no thyromegaly noted. Lungs:  cleared off the patient Heart:  regular rate and rhythm, 1-2/6 systolic murmur at the apex. Abdomen:  soft and nontender. No masses palpated. Extremities:  no edema. Neurologic:  grossly intact.   EKG  Procedure date:  10/18/2008  Findings:      normal sinus rhythm at a rate of 85. First degree AV block. Cannot rule out prior septal infarct. No ST changes.  Impression & Recommendations:  Problem # 1:  CHEST PAIN (ICD-786.50) His symptoms are atypical and most likely GI in etiology. His EKG shows no ST changes. I do not think this is cardiac. Will not pursue this further. His updated medication list for this problem includes:  Metoprolol Succinate 25 Mg Xr24h-tab (Metoprolol succinate) .[name]... 1 by mouth once daily    Aspirin Ec 325 Mg Tbec (Aspirin) .[name]... Take one tablet by mouth daily  Problem # 2:  MITRAL STENOSIS/ INSUFFICIENCY, NON-RHEUMATIC (ICD-424.0) The patient is status post mitral valve repair due to endocarditis. He will continue with SBE prophylaxis. We'll plan a repeat echocardiogram in one year when I see him back. His  updated medication list for this problem includes:    Metoprolol Succinate 25 Mg Xr24h-tab (Metoprolol succinate) .[name]... 1 by mouth once daily  Patient Instructions: 1)  Followup in one year.

## 2010-05-29 NOTE — Assessment & Plan Note (Signed)
Summary: SUDDEN NASEAU, SWEATY, DIZZY--OK TO ADD PER CHRAE///SPH   Vital Signs:  Patient profile:   50 year old male Weight:      170 pounds BMI:     23.80 Temp:     97.7 degrees F oral Pulse rate:   74 / minute Resp:     16 per minute BP supine:   112 / 78  (left arm) BP sitting:   110 / 86  (left arm) Cuff size:   regular  Vitals Entered By: Shonna Chock (May 04, 2009 4:32 PM) CC: 1.)Nausea,dizzy, and sweats since 10am today  2.)Refill Metoprolol-med was increased to 1 1/2 at last OV Comments REVIEWED MED LIST, PATIENT AGREED DOSE AND INSTRUCTION CORRECT    Primary Care Provider:  dr Quintella Reichert  CC:  1.)Nausea, dizzy, and and sweats since 10am today  2.)Refill Metoprolol-med was increased to 1 1/2 at last OV.  History of Present Illness: Paroxysmal nausea , sweating & dizziness  for 1-3 min this am. Persistant nausea since. Rx: none. Family members have had  "stomach bug".  Headaches previously treated have not reurred. Flu shot taken in 01/2009.  Allergies: 1)  ! Biaxin  Review of Systems General:  Denies chills, fever, and sweats. Eyes:  Denies blurring, double vision, and vision loss-both eyes. CV:  Denies chest pain or discomfort, difficulty breathing at night, difficulty breathing while lying down, and palpitations. Resp:  Denies shortness of breath. GI:  Denies constipation and diarrhea. Derm:  Denies lesion(s) and rash. Neuro:  Denies brief paralysis, headaches, numbness, sensation of room spinning, tingling, visual disturbances, and weakness.  Physical Exam  General:  well-nourished,in no acute distress; alert,appropriate and cooperative throughout examination Head:  Normocephalic and atraumatic without obvious abnormalities. Eyes:  No corneal or conjunctival inflammation noted. EOMI. Perrla. Field of Vision grossly normal. Arcus senilis Ears:  Wax bilaterally . Tuning fork exam WNL Mouth:  Oral mucosa and oropharynx without lesions or exudates.  Remaining teeth  in excellent  repair. Lungs:  Normal respiratory effort, chest expands symmetrically. Lungs are clear to auscultation, no crackles or wheezes. Heart:  Normal rate and regular rhythm.  Increased S1 and S2 . No  gallop, murmur, click, rub or other extra sounds. Abdomen:  Bowel sounds positive,abdomen soft and non-tender without masses, organomegaly or hernias noted. Pulses:  R and L carotid  pulses are full and equal bilaterally Neurologic:  alert & oriented X3, cranial nerves II-XII intact, strength normal in all extremities, sensation intact to light touch, gait normal, DTRs symmetrical and normal, finger-to-nose normal, and Romberg negative.   Skin:  Intact without suspicious lesions or rashes Cervical Nodes:  No lymphadenopathy noted Axillary Nodes:  No palpable lymphadenopathy Psych:  memory intact for recent and remote, normally interactive, good eye contact, and not anxious appearing.     Impression & Recommendations:  Problem # 1:  NAUSEA (ICD-787.02)  Problem # 2:  DIZZINESS (ICD-780.4) resolved  His updated medication list for this problem includes:    Fexofenadine Hcl 180 Mg Tabs (Fexofenadine hcl) .[name]... 1 once daily as needed    Promethazine Hcl 25 Mg Tabs (Promethazine hcl) .[name]... 1 q 6 hrs as needed nausea  Problem # 3:  SWEATING (ICD-780.8) Resolved  Complete Medication List: 1)  Metoprolol Succinate 25 Mg Xr24h-tab (Metoprolol succinate) .[name].. 1 1/2 by mouth once daily 2)  Aspirin Ec 325 Mg Tbec (Aspirin) .... Take one tablet by mouth daily 3)  Zantac (ranitidine Hcl)  .... As needed 4)  Fexofenadine  Hcl 180 Mg Tabs (Fexofenadine hcl) .[name].. 1 once daily as needed 5)  Mvi, Vit E, Omega 3  6)  Promethazine Hcl 25 Mg Tabs (Promethazine hcl) .[name].. 1 q 6 hrs as needed nausea  Patient Instructions: 1)  Report Warning Signs as discussed. 2)  Drink clear liquids only for the next 24 hours, then slowly add other liquids and food as you  tolerate them. Prescriptions: PROMETHAZINE  HCL 25 MG TABS (PROMETHAZINE HCL) 1 q 6 hrs as needed nausea  #5 x 0   Entered and Authorized by:   Marga Melnick MD   Signed by:   Marga Melnick MD on 05/04/2009   Method used:   Print then Give to Patient   RxID:   [id number]

## 2010-05-29 NOTE — Progress Notes (Signed)
Summary: phone  Phone Note Call from Patient   Caller: Patient Summary of Call: patient is having a naging in chest been going on for a week no shortnes of breath transferred the call to chrae. Initial call taken by: Barb Merino,  October 17, 2008 11:06 AM  Follow-up for Phone Call        PATIENT WAS TOLD AT THE TIME OF CALL TO CONTACT HIS CARDIOLOGIST. PATIENT WAS SUPPOSE TO HAVE APPOINTMENT WITH THEM TODAY AND GOT A CALL FRIDAY TO RESCHEDULE. PATIENT OK'D THAT HE WILL CALL THEM AND CALL us BACK IF FUTHER ASSISTANCE NEEDED. THIS MESSAGE WAS DOCUMENTED LATER. PATIENT CALL THIS MORNING ABOUT 11AM Follow-up by: Shonna Chock,  October 17, 2008 2:15 PM

## 2010-05-29 NOTE — Assessment & Plan Note (Signed)
Summary: cpx /cbs   Vital Signs:  Patient profile:   50 year old male Height:      70 inches Weight:      167.6 pounds BMI:     24.14 Pulse rate:   81 / minute Resp:     14 per minute BP sitting:   120 / 85  (left arm) Cuff size:   large  Vitals Entered By: Shonna Chock CMA (January 12, 2010 9:40 AM)  CC: CPX with fasting labs , heart work-up completed through cardiologist, General Medical Evaluation, Lipid Management Comments Patient will get Flu vaccine through his job   Primary Care Provider:  Alwyn Ren  CC:  CPX with fasting labs , heart work-up completed through cardiologist, General Medical Evaluation, and Lipid Management.  History of Present Illness: David [name] is here for a physical; he is asymptomatic.  Lipid Management History:      Positive NCEP/ATP III risk factors include male age 71 years old or older.  Negative NCEP/ATP III risk factors include non-diabetic, no family history for ischemic heart disease, non-tobacco-user status, non-hypertensive, no ASHD (atherosclerotic heart disease), no prior stroke/TIA, no peripheral vascular disease, and no history of aortic aneurysm.     Preventive Screening-Counseling & Management  Caffeine-Diet-Exercise     Does Patient Exercise: yes  Current Medications (verified): 1)  Metoprolol Succinate 25 Mg Xr24h-Tab (Metoprolol Succinate) .... Hold 1 1/2 By Mouth Once Daily 2)  Aspirin Ec 325 Mg Tbec (Aspirin) .... Take One Tablet By Mouth Daily 3)  Zantac  (Ranitidine Hcl) .... As Needed 4)  Fexofenadine Hcl 180 Mg Tabs (Fexofenadine Hcl) .[name].. 1 Once Daily As Needed 5)  Multivitamins   Tabs (Multiple Vitamin) .[name].. 1 Tab By Mouth When Pt Rembers 6)  Levitra 20 Mg Tabs (Vardenafil Hcl) .... 1/2-1 Once Daily As Needed 7)  Amoxicillin 500 Mg Caps (Amoxicillin) .[name].. 1 Three Times A Day 8)  Fluticasone Propionate 50 Mcg/act Susp (Fluticasone Propionate) .[name].. 1 Spray Two Times A Day As Crossover  Technique  Allergies: 1)  !  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  GERD Hyperlipidemia: LDL 137.4 in 2006. Framingham Study LDL goal = < 160.  Past Surgical History: 03/22/2008  Right miniature thoracotomy for mitral valve repair (Core-   Matrix patch reconstruction of the anterior leaflet, artificial Gore-Tex   chord replacement with plication of flail posterior leaflet, #26-mm   Edwards Physio II ring annuloplasty). Oral surgery for dental infection; Colonoscopy 2005: ext hemorrhoids  Family History: Father: negative Mother: negative Siblings:sister : ovarian cancer;   Social History: Tobacco Use - No.  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    Physical Exam  General:  Well-developed,well-nourished; alert,appropriate and cooperative throughout examination Head:  Normocephalic and atraumatic without obvious abnormalities. No apparent alopecia  Eyes:  No corneal or conjunctival inflammation noted. EOMI. Perrla. Funduscopic exam benign, without hemorrhages, exudates or papilledema. Vision  grossly normal. Ears:  External ear exam shows no significant lesions or deformities.  Otoscopic examination reveals  some cerumen bilaterally. Hearing is grossly normal  bilaterally. Nose:  External nasal examination shows no deformity or inflammation. Nasal mucosa are pink and moist without lesions or exudates. Mouth:  Oral mucosa and oropharynx without lesions or exudates.  Teeth in  excellent  repair; upper partial. Neck:  No deformities, masses, or tenderness noted. Chest Wall:  Accessory areolar L chest Lungs:  Normal respiratory effort, chest expands symmetrically. Lungs are clear to auscultation, no crackles or wheezes. Heart:  Normal rate and regular rhythm. Brisk S1 ; S2 normal without gallop, click, rub or other extra sounds.Grade  1/6 systolic murmur @ apex.   Abdomen:  Bowel sounds positive,abdomen soft and non-tender without masses, organomegaly or hernias noted. Rectal:  No external abnormalities noted. Normal sphincter tone. No rectal masses or tenderness. Genitalia:  Testes bilaterally descended without nodularity, tenderness or masses. No scrotal masses or lesions. No penis lesions or urethral discharge. L varicocele.  Qeakness L inguinal area Prostate:  no nodules, no induration, and R asymmetrical minimal enlargement.   Msk:  No deformity or scoliosis noted of thoracic or lumbar spine.   Pulses:  R and L carotid,radial,dorsalis pedis and posterior tibial pulses are full and equal bilaterally Extremities:  No clubbing, cyanosis, edema, or deformity noted with normal full range of motion of all joints.   Neurologic:  alert & oriented X3 and DTRs symmetrical and normal.   Skin:  Intact without suspicious lesions or rashes Cervical Nodes:  No lymphadenopathy noted Axillary Nodes:  No palpable lymphadenopathy Inguinal Nodes:  No significant adenopathy Psych:  memory intact for recent and remote, normally interactive, and good eye contact.     Impression & Recommendations:  Problem # 1:  ROUTINE GENERAL MEDICAL EXAM@HEALTH  CARE FACL (ICD-V70.0)  Orders: Venipuncture (29562) TLB-Lipid Panel (80061-LIPID) TLB-BMP (Basic Metabolic Panel-BMET)  (80048-METABOL) TLB-CBC Platelet - w/Differential (85025-CBCD) TLB-Hepatic/Liver Function Pnl (80076-HEPATIC) TLB-TSH (Thyroid Stimulating Hormone) (84443-TSH) TLB-PSA (Prostate Specific Antigen) (84153-PSA)  Problem # 2:  OTHER ENDOCARDITIS VALVE UNSPECIFIED (ICD-424.99) S/P valve repair His updated medication list for this problem includes:    Metoprolol Succinate 25 Mg Xr24h-tab (Metoprolol succinate) ..... Hold 1 1/2 by mouth once daily    Aspirin Ec 325 Mg Tbec (Aspirin) .[name]... Take one tablet by mouth daily  Complete Medication List: 1)  Metoprolol Succinate 25 Mg Xr24h-tab (Metoprolol succinate) .... Hold 1 1/2 by mouth once daily 2)  Aspirin Ec 325 Mg Tbec (Aspirin) .... Take one tablet by mouth daily 3)  Zantac (ranitidine Hcl)  .... As needed 4)  Fexofenadine Hcl 180 Mg Tabs (Fexofenadine hcl) .[name].. 1 once daily as needed 5)  Multivitamins Tabs (Multiple vitamin) .[name].. 1 tab by mouth when pt rembers 6)  Levitra 20 Mg Tabs (Vardenafil hcl) .... 1/2-1 once daily as needed 7)  Amoxicillin 500 Mg Caps (Amoxicillin) .[name].. 1 three times a day 8)  Fluticasone Propionate 50 Mcg/act Susp (Fluticasone propionate) .[name].. 1 spray two times a day as crossover  technique  Other Orders: Tdap => 47yrs IM (13086) Admin 1st Vaccine (57846)  Lipid Assessment/Plan:      Based on NCEP/ATP III, the patient's risk factor category is "0-1 risk factors".  The patient's lipid goals are as follows: Total cholesterol goal is 200; LDL cholesterol goal is 160; HDL cholesterol goal is 40; Triglyceride goal is 150.    Patient Instructions: 1)  It is important that you exercise regularly at  least 20 minutes 5 times a week. If you develop chest pain, have severe difficulty breathing, or feel very tired , stop exercising immediately and seek medical attention. 2)  Take an  81 mg coated Aspirin every day. Prescriptions: LEVITRA 20 MG TABS (VARDENAFIL HCL) 1/2-1 once daily as needed  #6 x 5   Entered and Authorized  by:   Marga Melnick MD   Signed by:   Marga Melnick MD on 01/12/2010   Method used:   Print then Give to Patient   RxID:   [id number]    Immunization History:  Pneumovax Immunization History:    Pneumovax:  historical (07/30/2004)  Immunizations Administered:  Tetanus Vaccine:    Vaccine Type: Tdap    Site: right deltoid    Mfr: GlaxoSmithKline    Dose: 0.5 ml    Route: IM    Given by: Shonna Chock CMA    Exp. Date: 02/16/2012    Lot #: [id number]    VIS given: 03/16/08 version given January 12, 2010.    Appended Document: cpx /cbs

## 2010-05-29 NOTE — Progress Notes (Signed)
Summary: NEURO REFERRAL   Phone Note Call from Patient Call back at Home Phone [phone]   Caller: Patient Summary of Call: PT CALLED BACK REGARDING HIS MESSAGE EARLIER TODAY.  PER HOPPER COMMENTS PT IS BEING REFERRED TO A NEUROSURGEON...CHRAE TO GIVE THIS INFORMATION TO THE PT.Marland KitchenMarland KitchenDaine Gip  February 16, 2008 4:10 PM Initial call taken by: Daine Gip,  February 16, 2008 4:10 PM  Follow-up for Phone Call        SPOKE WITH PATIENT INFORMED HIM OF REFERRAL TO BE SET UP . PATIENT IS CURRENTLY OUT OF OXCYCODONE 5-325MG  1-2 TABS by mouth 6 HOURS as needed PAIN. THIS WAS PRESCRIBED IN THE HOSPITAL, PATIENT NEEDS REFILL UNTIL REFERRAL APPOINTMENT DR.HOPPER PLEASE ADVIES [facility] Follow-up by: Shonna Chock,  February 16, 2008 4:18 PM  Additional Follow-up for Phone Call Additional follow up Details #1::        OK # 30; 1-2 q 6 hrs prn Additional Follow-up by: Marga Melnick MD,  February 16, 2008 6:14 PM    Additional Follow-up for Phone Call Additional follow up Details #2::    PATIENT CALLED BACK-? IF REFILL SENT TO PHARMACY, I DO SEE WHERE DR.HOPPER APPROVED AND DIDNT SEND TO ANYONE. WILL SEND RX @ THIS TIME.  PATIENT SAID HE IS GOING TO THE EMERGENCY ROOM  BECAUSE HE IS WORRIED THAT HE IS UNABLE TO MOVE X ALMOST 7 DAYS. PATIENT THOUGHT HE WOULD BE @ 100% BY NOW AND AND HE IS UNABLE TO MOVE DUE TO PAIN. DR.HOPPER WAS INFORMED OF THIS AND OK'D Follow-up by: Shonna Chock,  February 17, 2008 9:12 AM  New/Updated Medications: ENDOCET 5-325 MG TABS (OXYCODONE-ACETAMINOPHEN) 1-2 by mouth EVERY 6 as needed   Prescriptions: ENDOCET 5-325 MG TABS (OXYCODONE-ACETAMINOPHEN) 1-2 by mouth EVERY 6 as needed  #30 x 0   Entered by:   Shonna Chock   Authorized by:   Marga Melnick MD   Signed by:   Shonna Chock on 02/17/2008   Method used:   Printed then faxed to ...       [id number]       [address]       [place], [place]   [address]       Ph: [phone] or [phone]       Fax: [phone]   RxID:   [id number]

## 2010-05-29 NOTE — Assessment & Plan Note (Signed)
Summary: night sweats--acute only--tl   Vital Signs:  Patient Profile:   50 Years Old Male Weight:      157.4 pounds Temp:     97.8 degrees F BP sitting:   110 / 60  Vitals Entered By: Shary Decamp (February 08, 2008 4:25 PM)                 Chief Complaint:  pt had flu sxs 2 weeks ago -- better now except still having night sweats.  History of Present Illness: pt had flu sxs 2 weeks ago --  better now except still having night sweats had fever at the time of the flu, now low grade T up to 99 on-off     Updated Prior Medication List: No Medications Current Allergies (reviewed today): ! BIAXIN  Past Medical History:    Reviewed history from 06/18/2006 and no changes required:       History of enterococcal endocarditis aprox 2000, with residual moderate mitral  regurgitation        ECHO  performed on June 03, 2007.Unremarkable       DOES NEED SBE PROPHYLAXIS     Review of Systems  General      Denies weight loss.  Resp      Denies cough and sputum productive.      no SOB but tired  GU      Denies dysuria and hematuria.   Physical Exam  General:     alert, well-developed, and well-nourished.   Eyes:     no conjuntival lesions Ears:     L ear normal.  R ear wax Mouth:     no erythema and no exudates.   Lungs:     normal respiratory effort, no intercostal retractions, no accessory muscle use, and normal breath sounds.   Heart:     normal rate, regular rhythm, no gallop, no rub,  and grade  3/6 systolic murmur  Abdomen:     soft and non-tender.   Extremities:     no pretibial edema bilaterally , no ungeal lesions    Impression & Recommendations:  Problem # 1:  NIGHT SWEATS (ICD-780.8) symptoms are persistent Rec:  CXR , labs to call if symptoms persist > 2 weeks  Orders: T-2 View CXR (71020TC)    Patient Instructions: 1)  came back tomorrow for labs 2)  CBC BMP Dx 780.8 3)  have a chest XR   ]

## 2010-05-29 NOTE — Progress Notes (Signed)
Summary: Refill Request  Phone Note Call from Patient Call back at Home Phone [phone]   Caller: Patient Summary of Call: Patient was given samples of Levitra-need RX:  [facility]    Patient aware rx filled./Chrae Malloy  June 16, 2009 11:13 AM     New/Updated Medications: LEVITRA 20 MG TABS (VARDENAFIL HCL) 1/2-1 once daily as needed Prescriptions: LEVITRA 20 MG TABS (VARDENAFIL HCL) 1/2-1 once daily as needed  #6 x 0   Entered by:   Shonna Chock   Authorized by:   Marga Melnick MD   Signed by:   Shonna Chock on 06/16/2009   Method used:   Electronically [facility]        [phone][facility] (retail)       [address]       [place], [place]  [id number]       Ph: [phone] or [phone]       Fax: [phone]   RxID:   [id number]

## 2010-05-29 NOTE — Assessment & Plan Note (Signed)
Summary: ACUTE FOR HEADACHE//PH   Vital Signs:  Patient profile:   50 year old male Weight:      169.8 pounds Temp:     98.2 degrees F Pulse rate:   84 / minute Resp:     14 per minute BP sitting:   120 / 74  Vitals Entered By: Shary Decamp (March 16, 2009 1:39 PM) CC: HEADACHE, Headaches Is Patient Diabetic? No Comments  - HA everyday - in am  - doesn't last very long  - behind left eye  - typically goes away on its own .Shary Decamp  March 16, 2009 1:40 PM    Primary Care Provider:  dr Quintella Reichert  CC:  HEADACHE and Headaches.  History of Present Illness:  Headaches      This is a 50 year old man who presents with Headaches over 3 weeks, usually in early am ,3:30-4.  The patient denies visual aura,nausea, vomiting, sweats, tearing of eyes, nasal congestion, sinus pain, sinus pressure, photophobia, and phonophobia.  The headache is described as intermittent, 5 on 10 scale  and sharp behind OS.  The location of the pain is unilateral , never on R.  High-risk features (red flags) include new type of headache.  The patient denies the following high-risk features: fever, neck pain/stiffness, vision loss or change, focal weakness, altered mental status, rash, trauma, pain worse with exertion, age >50 years, immunosuppression, concomitant infection, and anticoagulation use.  Prior treatment has included acetaminophen with total relief < 30 min. No triggers( except he had  changed Beta blocker from qhs to am until last night) ; no PMH of migraines.  He took metoprolol last night @ 10:30  pm ; no headache this am. No PMH of HTN; Beta blocker is for tachycardia post cardiac surgery for valvular disease.  Current Medications (verified): 1)  Metoprolol Succinate 25 Mg Xr24h-Tab (Metoprolol Succinate) .[name].. 1 By Mouth Once Daily 2)  Aspirin Ec 325 Mg Tbec (Aspirin) .... Take One Tablet By Mouth Daily 3)  Zantac  (Ranitidine Hcl) .... As Needed 4)  Fexofenadine Hcl 180 Mg Tabs (Fexofenadine  Hcl) .[name].. 1 Once Daily As Needed 5)  Mvi, Vit E, Omega 3  Allergies (verified): 1)  ! Biaxin  Comments:  Nurse/Medical Assistant: The patient's medications were reviewed with the patient and were updated in the Medication List. Shary Decamp (March 16, 2009 1:40 PM)  Review of Systems General:  Denies chills, fever, and sweats. ENT:  Denies decreased hearing, ear discharge, earache, and ringing in ears; No purulence.  Physical Exam  General:  well-nourished,in no acute distress; alert,appropriate and cooperative throughout examination Eyes:  No corneal or conjunctival inflammation noted. EOMI. Perrla. FOV &  Vision grossly normal. Arcus Ears:  Wax R >L.Hearing intact Nose:  External nasal examination shows external crease;no inflammation. Nasal mucosa are pink and moist without lesions or exudates. Mouth:  Oral mucosa and oropharynx without lesions or exudates.  Teeth in good repair; anterior missing teeth. Heart:  Normal rate and regular rhythm. S1 and S2 increased without gallop, murmur, click, rub or other extra sounds. Pulses:  R and L carotid  pulses are full and equal bilaterally Neurologic:  alert & oriented X3, cranial nerves II-XII intact, strength normal in all extremities, sensation intact to light touch, gait normal, DTRs symmetrical and normal, finger-to-nose normal, and Romberg negative.   Skin:  Intact without suspicious lesions or rashes Cervical Nodes:  No lymphadenopathy noted   Impression & Recommendations:  Problem # 1:  HEADACHE (ICD-784.0) ? Ocular Migraine  His updated medication list for this problem includes:    Metoprolol Succinate 25 Mg Xr24h-tab (Metoprolol succinate) .[name]... 1 by mouth once daily    Aspirin Ec 325 Mg Tbec (Aspirin) .[name]... Take one tablet by mouth daily  Complete Medication List: 1)  Metoprolol Succinate 25 Mg Xr24h-tab (Metoprolol succinate) .[name].. 1 by mouth once daily 2)  Aspirin Ec 325 Mg Tbec (Aspirin) .... Take one tablet by  mouth daily 3)  Zantac (ranitidine Hcl)  .... As needed 4)  Fexofenadine Hcl 180 Mg Tabs (Fexofenadine hcl) .[name].. 1 once daily as needed 5)  Mvi, Vit E, Omega 3   Patient Instructions: 1)  Take Metoprolol at bedtime ; increase to 1&1/2 if headaches persist. See your Ophthalmologist.

## 2010-05-29 NOTE — Letter (Signed)
Summary: Results Follow-up Letter  [facility]  [address] [place], [place] [address]   Phone: [phone]  Fax: [phone]    03/09/2008        Govani Radloff [address] [place], [place]  [address]  Dear Mr. SOFIA,   The following are the results of your recent test(s):  Test     Result     Pap Smear    Normal_______  Not Normal_____       Comments: _________________________________________________________ Cholesterol LDL(Bad cholesterol):          Your goal is less than:         HDL (Good cholesterol):        Your goal is more than: _________________________________________________________ Other Tests:   _________________________________________________________  Please call for an appointment Or Please see attahed labs._________________________________________________________ _________________________________________________________ _________________________________________________________  Sincerely,  Felecia Deloach CMA [place] [facility]

## 2010-05-29 NOTE — Progress Notes (Signed)
Summary: Pinched Nerve  Phone Note Call from Patient Call back at Home Phone [phone]   Caller: Patient Summary of Call: Message left on voicemail-Patient with pinched nerve since last Thursday, patient said he is unable to walk. Patient said he is not able to make an appointment . Patient was seen in the emergency room last Thursday, prescribed pain med-only helps a little. Patient said he was told that in a couple of days he should be ok and he is not.   Record fron E-chart printed along with this message and place on ledge for review. Dr.Hopper please advise./[facility]  February 16, 2008 10:27 AM   New Problems: BACK PAIN, LUMBAR, WITH RADICULOPATHY (ICD-724.4)   New Problems: BACK PAIN, LUMBAR, WITH RADICULOPATHY (ICD-724.4)

## 2010-05-29 NOTE — Assessment & Plan Note (Signed)
Summary: NIGHT SWEATS/RH.....   Vital Signs:  Patient profile:   50 year old male Weight:      174.4 pounds Temp:     98.0 degrees F oral Pulse rate:   76 / minute Resp:     16 per minute BP sitting:   122 / 84  (left arm) Cuff size:   large  Vitals Entered By: Shonna Chock (May 12, 2009 5:06 PM) CC: Night Sweats Comments REVIEWED MED LIST, PATIENT AGREED DOSE AND INSTRUCTION CORRECT    Primary Care Provider:  dr Quintella Reichert  CC:  Night Sweats.  History of Present Illness: Onset as nasal congestion several days followed by night sweats 05/10/2009. Last night slept poorly due to "gnagging ache " L chest in RLDP, resolved in LLDP or when supine.  Allergies: 1)  ! Biaxin  Review of Systems General:  Denies chills; ? fever. ENT:  No frontal headache, facial pain or purulence. Resp:  Denies cough, shortness of breath, sputum productive, and wheezing. GI:  Denies abdominal pain; ? dyspepsia; went to  bed 1 hour post eatingtomato based soup.  Physical Exam  General:  well-nourished,in no acute distress; alert,appropriate and cooperative throughout examination Nose:  External nasal examination shows no deformity or inflammation. Nasal mucosa are pink and moist without lesions or exudates. Mouth:  Oral mucosa and oropharynx without lesions or exudates.  Teeth in good repair. No pharyngeal erythema.   Lungs:  Normal respiratory effort, chest expands symmetrically. Lungs are clear to auscultation, no crackles or wheezes. Heart:  Normal rate and regular rhythm. S1 and S2 normal without gallop, murmur, click, rub or other extra sounds. Abdomen:  Bowel sounds positive,abdomen soft and non-tender without masses, organomegaly or hernias noted. Skin:  Intact without suspicious lesions or rashes Cervical Nodes:  No lymphadenopathy noted Axillary Nodes:  No palpable lymphadenopathy   Impression & Recommendations:  Problem # 1:  HIATAL HERNIA (ICD-553.3)  Complete Medication List: 1)   Metoprolol Succinate 25 Mg Xr24h-tab (Metoprolol succinate) .[name].. 1 1/2 by mouth once daily 2)  Aspirin Ec 325 Mg Tbec (Aspirin) .... Take one tablet by mouth daily 3)  Zantac (ranitidine Hcl)  .... As needed 4)  Fexofenadine Hcl 180 Mg Tabs (Fexofenadine hcl) .[name].. 1 once daily as needed 5)  Mvi, Vit E, Omega 3   Patient Instructions: 1)  Ranitidine 150 mg every 12 hrs as needed pre meals for similar symptoms. 2)  Avoid foods high in acid (tomatoes, citrus juices, spicy foods). Avoid eating within two hours of lying down or before exercising. Do not over eat; try smaller more frequent meals. Elevate head of bed twelve inches when sleeping.Report Warning Signs. Prescriptions: ZANTAC  (RANITIDINE HCL) as needed  #60 x 5   Entered and Authorized by:   Marga Melnick MD   Signed by:   Marga Melnick MD on 05/12/2009   Method used:   Print then Give to Patient   RxID:   [id number]

## 2010-05-29 NOTE — Consult Note (Signed)
Summary: [facility]  [facility] [facility]   Imported By: David [name] 02/17/2010 10:27:04  _____________________________________________________________________  External Attachment:    Type:   Image     Comment:   External Document

## 2010-05-29 NOTE — Progress Notes (Signed)
Summary: UPDATE ON CONDITION  Phone Note Call from Patient Call back at [phone]   Caller: Patient Summary of Call: MESSAGE LEFT ON VOICEMAIL-PATIENT CALLED TO SAY HE WENT TO EMERGENCY ROOM, HE IS NOW WALKING-STILL WITH PAIN BUT ABLE TO WALK. PATIENT SAID HE WAS TOLD TO STILL FOLLOW-UP WITH DR.HOPPER AND TO KEEP APPOINTMENT WITH NEURO(PENDING).   CALLED PATIENT AND LEFT MESSAGE ON VOICEMAIL FOR HIM TO CALL-[facility] TO SPEAK WITH SCHEDULER TO GET APPOINTMENT FOR MONDAY-30MIN (OK TO PUT 2 TOGETHER), DR.HOPPER IS OUT OF THE OFFICE TOMORROW. Chrae Malloy  February 18, 2008 10:06 AM

## 2010-05-29 NOTE — Letter (Signed)
Summary: [place] No Show Letter  [facility]  [address] [place], [place] [address]   Phone: [phone]  Fax: [phone]    02/11/2008 MRN: [id number]  [facility] [address] [place], [place]  [address]   Dear David [name],   Our records indicate that you missed your scheduled appointment with _____________________ on ____________.  Please contact this office to reschedule your appointment as soon as possible.  It is important that you keep your scheduled appointments with your physician, so we can provide you the best care possible.  Please be advised that there may be a charge for "no show" appointments.    Sincerely,   [place] [facility]

## 2010-05-29 NOTE — Assessment & Plan Note (Signed)
Visit Type:  Follow-up Primary Provider:  dr Quintella Reichert  CC:  pt pulse has been fast at times... need refill on toprol.  History of Present Illness: 50 year old male with past medical history of endocarditis. In November of 2009 he had mitral valve repair  via a mini thoracotomy. An echocardiogram was performed in December following his procedure that showed an ejection fraction of 45% with mild mitral stenosis but no significant mitral regurgitation. Also note he had a catheterization prior to his surgery that showed normal coronary arteries. Since I last saw him he is doing well symptomatically. There is no dyspnea, chest pain, palpitations or syncope and no pedal edema. He did have followup blood cultures after completion of his antibiotics. He showed no growth. He has not had fevers or chills.  Current Medications (verified): 1)  Metoprolol Succinate 25 Mg Xr24h-Tab (Metoprolol Succinate) .[name].. 1 By Mouth Once Daily 2)  Asa 325mg  .... 1 Tab Daily  Allergies: 1)  ! 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   Past Surgical History:    Reviewed history from 06/29/2008 and no changes required:    03/22/08  Right miniature thoracotomy for mitral valve repair (Core-      Matrix patch reconstruction of the anterior leaflet, artificial Gore-Tex      chord replacement with plication of flail posterior leaflet, #26-mm      Edwards Physio II ring annuloplasty).   Vital Signs:  Patient profile:   50 year old male Height:      71 inches Weight:      159 pounds Pulse rate:   95 / minute Pulse rhythm:   regular BP sitting:   125 / 82  (left arm)  Problems:  Medical Problems Added: 1)  Dx of Other Primary  Cardiomyopathies  (ICD-425.4)  Physical Exam  General:  HEENT normal Neck:  supple Lungs:  clear to auscultation Heart:  regular rate and rhythm with 2/6 systolic murmur at the apex. Abdomen:  soft Extremities:  no edema   EKG  Procedure date:  07/15/2008  Findings:      normal sinus rhythm. Prior septal infarct cannot be excluded. Nonspecific ST changes.  Impression & Recommendations:  Problem # 1:  MITRAL STENOSIS/ INSUFFICIENCY, NON-RHEUMATIC (ICD-424.0) The patient is now status post mitral valve repair for endocarditis. He is doing well from a symptomatic standpoint. He will continue with SBE prophylaxis. We will repeat his echocardiogram in one year. His updated medication list for this problem includes:    Metoprolol Succinate 25 Mg Xr24h-tab (Metoprolol succinate) .[name]... 1 by mouth once daily  Problem # 2:  BACK PAIN, LUMBAR, WITH RADICULOPATHY (ICD-724.4) His back pain has improved.  Problem # 3:  OTHER PRIMARY CARDIOMYOPATHIES (ICD-425.4) The patient's last echocardiogram showed mildly reduced LV function. He will continue on his beta blocker. The following medications were removed from the medication list:    Jantoven 5 Mg Tabs (Warfarin sodium) ..... Monitored by dr.crenshaw His updated medication list for this problem includes:    Metoprolol Succinate 25 Mg Xr24h-tab (Metoprolol succinate) .[name]... 1 by mouth once daily  Patient Instructions: 1)  The patient will followup in 6 months. If  he is doing well at that time we will see him back on a yearly basis.

## 2010-05-29 NOTE — Progress Notes (Signed)
Summary: hopper-cardiology appt.  Phone Note Call from Patient   Caller: Patient Summary of Call: pt calling about his appt for his cardiology and to call him at [phone] Initial call taken by: Freddy Jaksch,  April 01, 2007 10:49 AM  Follow-up for Phone Call        PT WAS CONTACTED ON 11.07.08 AND HE DIDN'T CALL BACK. AS RESULT HE NO SHOWED FOR HIS APPT WITH CARDIOLOGY Follow-up by: Gwen Pounds,  April 21, 2007 3:27 PM

## 2010-05-29 NOTE — Assessment & Plan Note (Signed)
Summary: flu shot//ca  Nurse Visit    Prior Medications: ALLEGRA 180 MG  TABS (FEXOFENADINE HCL) 1 by mouth once daily ADULT ASPIRIN LOW STRENGTH 81 MG  TBDP (ASPIRIN) 1 by mouth once daily MVI () 1 by mouth once daily Current Allergies: ! BIAXIN   Influenza Vaccine    Vaccine Type: Fluvax Non-MCR    Site: right deltoid    Dose: 0.5 ml    Route: IM    Given by: Chrae Malloy    Exp. Date: 10/27/2007    Lot #: [id number]    VIS given: 11/20/06 version given April 10, 2007.   Orders Added: 1)  Influenza Vaccine NON MCR [00028]    ]

## 2010-05-29 NOTE — Assessment & Plan Note (Signed)
Summary: congested/drainage/cbs   Vital Signs:  Patient profile:   50 year old male Height:      71 inches (180.34 cm) Weight:      165 pounds (75 kg) BMI:     23.10 Temp:     98.2 degrees F (36.78 degrees C) oral Resp:     15 per minute BP sitting:   130 / 90  (right arm) Cuff size:   regular  Vitals Entered By: Lucious Groves CMA (December 13, 2009 11:57 AM) CC: C/O congestion and green sinus drainage./kb, URI symptoms Is Patient Diabetic? No Pain Assessment Patient in pain? no      Comments Patient notes that he has had a mild HA, but denies fever and cough. Patient also notes that he would like refill of Levitra./kb   Primary Care Provider:  Alwyn Ren  CC:  C/O congestion and green sinus drainage./kb and URI symptoms.  History of Present Illness:  URI Symptoms      This is a 51 year old man who presents with URI symptoms X 5 days .  The patient reports nasal congestion, green purulent nasal discharge, and sore throat, but denies productive cough and earache.  The patient denies fever, dyspnea, and wheezing.  The patient also reports frontal  headache.  The patient denies the following risk factors for Strep sinusitis:  facial pain and tender adenopathy.   Rx: none  Current Medications (verified): 1)  Metoprolol Succinate 25 Mg Xr24h-Tab (Metoprolol Succinate) .... Hold 1 1/2 By Mouth Once Daily 2)  Aspirin Ec 325 Mg Tbec (Aspirin) .... Take One Tablet By Mouth Daily 3)  Zantac  (Ranitidine Hcl) .... As Needed 4)  Fexofenadine Hcl 180 Mg Tabs (Fexofenadine Hcl) .[name].. 1 Once Daily As Needed 5)  Multivitamins   Tabs (Multiple Vitamin) .[name].. 1 Tab By Mouth When Pt Rembers 6)  Levitra 20 Mg Tabs (Vardenafil Hcl) .... 1/2-1 Once Daily As Needed  Allergies (verified): 1)  ! Biaxin  Physical Exam  General:  in no acute distress; alert,appropriate and cooperative throughout examination Ears:  External ear exam shows no significant lesions or deformities.  Otoscopic examination  reveals clear canals, tympanic membranes are intact bilaterally without bulging, retraction, inflammation or discharge. Hearing is grossly normal bilaterally. Nose:  External nasal examination shows no deformity or inflammation. Nasal mucosa are  dry without lesions or exudates. Mouth:  Oral mucosa and oropharynx without lesions or exudates.  Teeth in good repair. Lungs:  Normal respiratory effort, chest expands symmetrically. Lungs are clear to auscultation, no crackles or wheezes. Heart:  Normal rate and regular rhythm. S1 increased ; S2 normal without gallop, murmur, click, rub or other extra sounds. Cervical Nodes:  Minor LA   Axillary Nodes:  No palpable lymphadenopathy   Impression & Recommendations:  Problem # 1:  SINUSITIS- ACUTE-NOS (ICD-461.9)  His updated medication list for this problem includes:    Amoxicillin 500 Mg Caps (Amoxicillin) .[name]... 1 three times a day    Fluticasone Propionate 50 Mcg/act Susp (Fluticasone propionate) .[name]... 1 spray two times a day as crossover  technique  Complete Medication List: 1)  Metoprolol Succinate 25 Mg Xr24h-tab (Metoprolol succinate) .... Hold 1 1/2 by mouth once daily 2)  Aspirin Ec 325 Mg Tbec (Aspirin) .... Take one tablet by mouth daily 3)  Zantac (ranitidine Hcl)  .... As needed 4)  Fexofenadine Hcl 180 Mg Tabs (Fexofenadine hcl) .[name].. 1 once daily as needed 5)  Multivitamins Tabs (Multiple vitamin) .[name].. 1 tab by mouth  when pt rembers 6)  Levitra 20 Mg Tabs (Vardenafil hcl) .... 1/2-1 once daily as needed 7)  Amoxicillin 500 Mg Caps (Amoxicillin) .[name].. 1 three times a day 8)  Fluticasone Propionate 50 Mcg/act Susp (Fluticasone propionate) .[name].. 1 spray two times a day as crossover  technique  Patient Instructions: 1)  Neti pot once daily until nasal congestion is gone. 2)  Drink as much fluid as you can tolerate for the next few days. Prescriptions: FLUTICASONE PROPIONATE 50 MCG/ACT SUSP (FLUTICASONE PROPIONATE) 1 spray two times a day  as crossover  technique  #1 x 5   Entered and Authorized by:   Marga Melnick MD   Signed by:   Marga Melnick MD on 12/13/2009   Method used:   Faxed to ...       [facility] [phone]* (retail)       [address]       [place], [place]  [id number]       Ph: [phone] or [phone]       Fax: [phone]   RxID:   [id number] AMOXICILLIN 500 MG CAPS (AMOXICILLIN) 1 three times a day  #30 x 0   Entered and Authorized by:   Marga Melnick MD   Signed by:   Marga Melnick MD on 12/13/2009   Method used:   Faxed to ...       [facility]  [address] [phone]* (retail)       [address]       [place], [place]  [id number]       Ph: [phone] or [phone]       Fax: [phone]   RxID:   [id number]

## 2010-05-29 NOTE — Consult Note (Signed)
Summary: [facility]  [facility]   Imported By: Lanelle Bal 03/04/2008 13:18:30  _____________________________________________________________________  External Attachment:    Type:   Image     Comment:   External Document

## 2010-05-29 NOTE — Assessment & Plan Note (Signed)
Summary: night sweats,tired//tl acute only   Vital Signs:  Patient Profile:   50 Years Old Male Weight:      150.2 pounds Temp:     98.4 degrees F oral Pulse rate:   84 / minute Resp:     20 per minute BP sitting:   110 / 78  (left arm) Cuff size:   regular  Pt. in pain?   no  Vitals Entered By: Shonna Chock (March 02, 2007 4:16 PM)                  Chief Complaint:  NIGHT SWEATS AND VERY FATIGUE X 1 WEEK.  Current Allergies: ! BIAXIN     Review of Systems  General      Complains of sweats.      Denies chills, fatigue, fever, malaise, weakness, and weight loss.      Sweats intermittently X 2 weeks; this am he was sweating frankly  Eyes      Denies discharge, eye pain, and red eye.  ENT      Denies earache, nasal congestion, and sinus pressure.      No facial pain or purulence  CV      Denies chest pain or discomfort, difficulty breathing at night, difficulty breathing while lying down, swelling of feet, and swelling of hands.  Resp      Denies cough, shortness of breath, and sputum productive.  GI      Denies diarrhea.  GU      Denies dysuria and nocturia.  Derm      Denies rash.   Physical Exam  General:     Well-developed,well-nourished,in no acute distress; alert,appropriate and cooperative throughout examination Head:     Normocephalic and atraumatic without obvious abnormalities. Alopecia totalis. Eyes:     No icterus Mouth:     Oral mucosa and oropharynx without lesions or exudates.  Teeth in good repair. Lungs:     Normal respiratory effort, chest expands symmetrically. Lungs are clear to auscultation, no crackles or wheezes. Heart:     normal rate, regular rhythm, no gallop, no rub, no JVD, no HJR, and grade 2& 1/2 -3   /6 systolic murmur @ apex.   Abdomen:     Bowel sounds positive,abdomen soft and non-tender without masses, organomegaly or hernias noted. Scaphoid abd , well muscled Skin:     Intact without suspicious lesions  or rashes. Scar over bridge of nose Cervical Nodes:     Very tiny LA on L Axillary Nodes:     No palpable lymphadenopathy    Impression & Recommendations:  Problem # 1:  NIGHT SWEATS (ICD-780.8)  Problem # 2:  OTHER ENDOCARDITIS VALVE UNSPECIFIED (ICD-424.99)  His updated medication list for this problem includes:    Adult Aspirin Low Strength 81 Mg Tbdp (Aspirin) .[name]... 1 by mouth once daily  Orders: Cardiology Referral (Cardiology)   Complete Medication List: 1)  Allegra 180 Mg Tabs (Fexofenadine hcl) .[name].. 1 by mouth once daily 2)  Adult Aspirin Low Strength 81 Mg Tbdp (Aspirin) .[name].. 1 by mouth once daily 3)  Mvi  .[name].. 1 by mouth once daily   Patient Instructions: 1)  Keep a diary of events  & monitor temperature. Report  of any signs of infections.    ]

## 2010-05-29 NOTE — Assessment & Plan Note (Signed)
Summary: F1Y/DM   Primary Provider:  dr Quintella Reichert  CC:  yealry check up and pt has not been taken him Toprol.  History of Present Illness: Pleasant  male with past medical history of endocarditis. In November of 2009 he had mitral valve repair  via a mini thoracotomy. An echocardiogram was performed in December following his procedure that showed an ejection fraction of 45% with mild mitral stenosis but no significant mitral regurgitation. Also note he had a catheterization prior to his surgery that showed normal coronary arteries. I last saw him in June of 2010.  Since I last saw him the patient denies any dyspnea on exertion, orthopnea, PND, pedal edema, palpitations, syncope or chest pain.    Current Medications (verified): 1)  Metoprolol Succinate 25 Mg Xr24h-Tab (Metoprolol Succinate) .... Hold 1 1/2 By Mouth Once Daily 2)  Aspirin Ec 325 Mg Tbec (Aspirin) .... Take One Tablet By Mouth Daily 3)  Zantac  (Ranitidine Hcl) .... As Needed 4)  Fexofenadine Hcl 180 Mg Tabs (Fexofenadine Hcl) .[name].. 1 Once Daily As Needed 5)  Multivitamins   Tabs (Multiple Vitamin) .[name].. 1 Tab By Mouth When Pt Rembers 6)  Levitra 20 Mg Tabs (Vardenafil Hcl) .... 1/2-1 Once Daily As Needed  Allergies: 1)  ! Biaxin  Past History:  Past Medical History: Reviewed history from 06/29/2008 and no changes required. 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   Past Surgical History: Reviewed history from 06/29/2008 and no changes required. 03/22/08  Right miniature thoracotomy for mitral valve repair (Core-   Matrix patch reconstruction of the anterior leaflet, artificial Gore-Tex   chord replacement with plication of flail posterior leaflet, #26-mm   Edwards Physio II ring annuloplasty).   Social History: Reviewed history from 06/29/2008 and no changes  required. Tobacco Use - No.  Alcohol Use - no Drug Use - no  Review of Systems       no fevers or chills, productive cough, hemoptysis, dysphasia, odynophagia, melena, hematochezia, dysuria, hematuria, rash, seizure activity, orthopnea, PND, pedal edema, claudication. Remaining systems are negative.   Vital Signs:  Patient profile:   50 year old male Height:      71 inches Weight:      164 pounds BMI:     22.96 Pulse rate:   80 / minute Resp:     14 per minute BP sitting:   124 / 70  (left arm)  Vitals Entered By: Kem Parkinson (October 19, 2009 4:14 PM)  Physical Exam  General:  Well-developed well-nourished in no acute distress.  Skin is warm and dry.  HEENT is normal.  Neck is supple. No thyromegaly.  Chest is clear to auscultation with normal expansion.  Cardiovascular exam is regular rate and rhythm. 2/6 systolic murmur at the apex. Abdominal exam nontender or distended. No masses palpated. Extremities show no edema. neuro grossly intact    EKG  Procedure date:  10/19/2009  Findings:      Normal sinus rhythm at a rate of 80. RV conduction delay. Cannot rule out prior septal infarct.  Impression & Recommendations:  Problem # 1:  OTHER PRIMARY CARDIOMYOPATHIES (ICD-425.4) LV function mildly reduced on previous echo. Plan repeat study. He discontinued his Toprol on his own. We will await followup echocardiogram prior to making decision about restarting. If LV function normal he will stay off of  this medication. His updated medication list for this problem includes:    Metoprolol Succinate 25 Mg Xr24h-tab (Metoprolol succinate) ..... Hold 1 1/2 by mouth once daily    Aspirin Ec 325 Mg Tbec (Aspirin) .[name]... Take one tablet by mouth daily  His updated medication list for this problem includes:    Metoprolol Succinate 25 Mg Xr24h-tab (Metoprolol succinate) ..... Hold 1 1/2 by mouth once daily    Aspirin Ec 325 Mg Tbec (Aspirin) .[name]... Take one tablet by mouth  daily  Problem # 2:  MITRAL STENOSIS/ INSUFFICIENCY, NON-RHEUMATIC (ICD-424.0)  Status post repair. Continued SBE prophylaxis. Repeat echocardiogram. His updated medication list for this problem includes:    Metoprolol Succinate 25 Mg Xr24h-tab (Metoprolol succinate) ..... Hold 1 1/2 by mouth once daily  Orders: Echocardiogram (Echo)  His updated medication list for this problem includes:    Metoprolol Succinate 25 Mg Xr24h-tab (Metoprolol succinate) ..... Hold 1 1/2 by mouth once daily  Problem # 3:  OTHER ENDOCARDITIS VALVE UNSPECIFIED (ICD-424.99) Patient has history of endocarditis. Will need lifelong SBE prophylaxis.  Other Orders: EKG w/ Interpretation (93000)  Patient Instructions: 1)  Your physician recommends that you schedule a follow-up appointment in: YEAR DR CRENSHAW 2)  Your physician has recommended you make the following change in your medication: STOP METOPROLOL 3)  Your physician has requested that you have an echocardiogram.  Echocardiography is a painless test that uses sound waves to create images of your heart. It provides your doctor with information about the size and shape of your heart and how well your heart's chambers and valves are working.  This procedure takes approximately one hour. There are no restrictions for this procedure.

## 2010-05-29 NOTE — Letter (Signed)
Summary: Results Follow up Letter  [facility]  [address] [place], [place] [address]   Phone: [phone]  Fax: [phone]    06/08/2007 MRN: [id number]  David [name] [address] [place], [place]  [address]  Dear [name],  The following are the results of your recent test(s):  Test         Result    Pap Smear:        Normal _____  Not Normal _____ Comments: ______________________________________________________ Cholesterol: LDL(Bad cholesterol):         Your goal is less than:         HDL (Good cholesterol):       Your goal is more than: Comments:  ______________________________________________________ Mammogram:        Normal _____  Not Normal _____ Comments:  ___________________________________________________________________ Hemoccult:        Normal _____  Not normal _______ Comments:    _____________________________________________________________________ Other Tests:  Please see attached results and comments   We routinely do not discuss normal results over the telephone.  If you desire a copy of the results, or you have any questions about this information we can discuss them at your next office visit.   Sincerely,

## 2010-05-31 NOTE — Assessment & Plan Note (Signed)
Summary: FOR A NOSE BLEED//PH   Vital Signs:  Patient profile:   50 year old male Weight:      163.4 pounds BMI:     23.53 Temp:     98.2 degrees F oral Pulse rate:   76 / minute Resp:     14 per minute BP sitting:   122 / 80  (left arm) Cuff size:   large  Vitals Entered By: Shonna Chock CMA (April 16, 2010 4:20 PM) CC: Nose bleed this am and severe Headache. Headache eased off a little, Headaches   Primary Care Provider:  Alwyn Ren  CC:  Nose bleed this am and severe Headache. Headache eased off a little and Headaches.  History of Present Illness:      This is a 50 year old man who presents with Headaches since this am upon awakening.  The patient denies nausea, vomiting, sweats, tearing of eyes, nasal congestion, sinus pain, sinus pressure, photophobia, and phonophobia.  The headache is described as constant and dull- gnagging .  The location of the pain is unilateral on the left.  The patient denies the following high-risk features: fever, vision loss or change, focal weakness, rash, concomitant infection, and anticoagulation use.  The headaches  had no trigger.Treatment has included an asirin Geologist, engineering with improvement.Epistaxis from  nare with headache. His home has been hotter than usual.No added humidification to gas heat. No PMH of HTN.  Current Medications (verified): 1)  Aspirin Ec 325 Mg Tbec (Aspirin) .... Take One Tablet By Mouth Daily 2)  Zantac  (Ranitidine Hcl) .... As Needed 3)  Fexofenadine Hcl 180 Mg Tabs (Fexofenadine Hcl) .[name].. 1 Once Daily As Needed 4)  Multivitamins   Tabs (Multiple Vitamin) .[name].. 1 Tab By Mouth When Pt Rembers 5)  Levitra 20 Mg Tabs (Vardenafil Hcl) .... 1/2-1 Once Daily As Needed  Allergies: 1)  ! Biaxin  Review of Systems General:  Denies chills and sweats. Eyes:  Denies blurring, double vision, and vision loss-both eyes. ENT:  Complains of nosebleeds; denies nasal congestion; No facial pain , frontal hradache or purulence. Resp:   Denies coughing up blood. GI:  Denies bloody stools and dark tarry stools. GU:  Denies hematuria. Neuro:  Complains of numbness and tingling; N&T R hand this am , ? slept on it. Heme:  Denies abnormal bruising.  Physical Exam  General:  well-nourished,in no acute distress; alert,appropriate and cooperative throughout examination Eyes:  No corneal or conjunctival inflammation noted. EOMI. Perrla. Field of  Vision grossly normal. Ears:  External ear exam shows no significant lesions or deformities.  Otoscopic examination reveals clear canals, tympanic membranes are intact bilaterally without bulging, retraction, inflammation or discharge. Hearing is grossly normal bilaterally. Nose:  External nasal examination shows no deformity or inflammation. Nasal mucosa are  erythematous  without lesions or exudates. Septum to R  Mouth:  Oral mucosa and oropharynx without lesions or exudates.  Teeth in good repair. Pulses:  R and L carotid   pulses are full and equal bilaterally Neurologic:  alert & oriented X3, cranial nerves II-XII intact, strength normal in all extremities, gait normal, DTRs symmetrical and normal, finger-to-nose normal, and Romberg negative.   Skin:  Intact without suspicious lesions or rashes Cervical Nodes:  No lymphadenopathy noted Axillary Nodes:  No palpable lymphadenopathy Psych:  memory intact for recent and remote, normally interactive, and good eye contact.     Impression & Recommendations:  Problem # 1:  HEADACHE (ICD-784.0) no worrisome features  The  following medications were removed from the medication list:    Metoprolol Succinate 25 Mg Xr24h-tab (Metoprolol succinate) ..... Hold 1 1/2 by mouth once daily His updated medication list for this problem includes:    Aspirin Ec 325 Mg Tbec (Aspirin) .[name]... Take one tablet by mouth daily    Tramadol Hcl 50 Mg Tabs (Tramadol hcl) .[name]... 1 every 6 hrs as needed for pain  or headache  Problem # 2:  EPISTAXIS  (ICD-784.7) probably from drying  Complete Medication List: 1)  Aspirin Ec 325 Mg Tbec (Aspirin) .... Take one tablet by mouth daily 2)  Zantac (ranitidine Hcl)  .... As needed 3)  Fexofenadine Hcl 180 Mg Tabs (Fexofenadine hcl) .[name].. 1 once daily as needed 4)  Multivitamins Tabs (Multiple vitamin) .[name].. 1 tab by mouth when pt rembers 5)  Levitra 20 Mg Tabs (Vardenafil hcl) .... 1/2-1 once daily as needed 6)  Tramadol Hcl 50 Mg Tabs (Tramadol hcl) .[name].. 1 every 6 hrs as needed for pain  or headache  Patient Instructions: 1)  Consider checking home humidification system if symptoms recur.  Nasal saline  & Eucerin applied two times a day to nares as needed  , not  at bedtime. Keep a Headache Diary as discussed. Prescriptions: TRAMADOL HCL 50 MG TABS (TRAMADOL HCL) 1 every 6 hrs as needed for pain  or headache  #30 x 0   Entered and Authorized by:   Marga Melnick MD   Signed by:   Marga Melnick MD on 04/16/2010   Method used:   Print then Give to Patient   RxID:   [id number]    Orders Added: 1)  Est. Patient Level III [[address]]

## 2010-05-31 NOTE — Progress Notes (Signed)
Summary: Refill Request  Phone Note Refill Request Call back at [phone] Message from:  Pharmacy on May 14, 2010 8:38 AM  Refills Requested: Medication #1:  FEXOFENADINE HCL 180 MG TABS 1 once daily as needed   Dosage confirmed as above?Dosage Confirmed   Supply Requested: 1 month   Last Ref[facility]: [facility] [address]   Next Appointment Scheduled: none Initial call taken by: Harold Barban,  May 14, 2010 8:38 AM    Prescriptions: FEXOFENADINE HCL 180 MG TABS (FEXOFENADINE HCL) 1 once daily as needed  #30 x 5   Entered by:   Shonna Chock CMA   Authorized by:   Marga Melnick MD   Signed by:   Shonna Chock CMA on 05/14/2010   Method used:   [facility]ically to        [facility] [phone]* (retail)       [address]       [place], [place]  [id number]       Ph: [phone] or [phone]       Fax: [phone]   RxID:   [id number]

## 2010-06-01 NOTE — Letter (Signed)
Summary: [facility]   Imported By: Lanelle Bal 03/13/2010 04:54:09  _____________________________________________________________________  External Attachment:    Type:   Image     Comment:   External Document

## 2010-06-01 NOTE — Letter (Signed)
Summary: [facility]  [facility]   Imported By: Lanelle Bal 05/17/2008 10:19:36  _____________________________________________________________________  External Attachment:    Type:   Image     Comment:   External Document

## 2010-06-14 ENCOUNTER — Ambulatory Visit (INDEPENDENT_AMBULATORY_CARE_PROVIDER_SITE_OTHER): Payer: BC Managed Care – PPO | Admitting: Internal Medicine

## 2010-06-14 ENCOUNTER — Encounter: Payer: Self-pay | Admitting: Internal Medicine

## 2010-06-14 DIAGNOSIS — R040 Epistaxis: Secondary | ICD-10-CM

## 2010-06-20 NOTE — Assessment & Plan Note (Signed)
Summary: STUFFY HEAD, LEFT EAR STUFFY, NOSE BLEED///SPH   Vital Signs:  Patient profile:   50 year old male Weight:      167.6 pounds BMI:     24.14 Temp:     98.7 degrees F oral Pulse rate:   76 / minute Resp:     12 per minute BP sitting:   116 / 80  (left arm) Cuff size:   large  Vitals Entered By: Shonna Chock CMA (June 14, 2010 10:47 AM) CC: Stuffy head , URI symptoms   Primary Care Provider:  Alwyn Ren  CC:  Stuffy head  and URI symptoms.  History of Present Illness:    Onset as head congestion & rhinitis this am; he had  intermittent epistaxis since  02/12.He now  reports nasal congestion, but denies purulent nasal discharge, sore throat, productive cough, and earache.  The patient denies fever, dyspnea, and wheezing.  The patient denies frontal  headache, bilateral facial pain, tooth pain, and tender adenopathy.    Allergies: 1)  ! Biaxin  Review of Systems Resp:  Denies coughing up blood. GI:  Denies bloody stools and dark tarry stools. GU:  Denies hematuria. Heme:  Denies abnormal bruising and bleeding.  Physical Exam  General:  well-nourished,in no acute distress; alert,appropriate and cooperative throughout examination Ears:  External ear exam shows no significant lesions or deformities.  Otoscopic examination reveals  some wax bilaterally Nose:  External nasal examination shows no deformity or inflammation. Nasal mucosa are  dry & erythematous , especially R without lesions or exudates. Mouth:  Oral mucosa and oropharynx without lesions or exudates.  Teeth in good repair. Lungs:  Normal respiratory effort, chest expands symmetrically. Lungs are clear to auscultation, no crackles or wheezes. Cervical Nodes:  No lymphadenopathy noted Axillary Nodes:  No palpable lymphadenopathy   Impression & Recommendations:  Problem # 1:  EPISTAXIS (ICD-784.7) Assessment Unchanged BP normal; no bleeding dyscrasias   Complete Medication List: 1)  Aspirin Ec 325 Mg Tbec  (Aspirin) .... Take one tablet by mouth daily 2)  Ranitidine Hcl 150 Mg  .[name].. 1 pre meals as needed 3)  Multivitamins Tabs (Multiple vitamin) .[name].. 1 tab by mouth when pt rembers 4)  Levitra 20 Mg Tabs (Vardenafil hcl) .... 1/2-1 once daily as needed 5)  Tramadol Hcl 50 Mg Tabs (Tramadol hcl) .[name].. 1 every 6 hrs as needed for pain  or headache 6)  Loratadine 10 Mg Tabs (Loratadine) .[name].. 1 once daily as needed for allergies  Patient Instructions: 1)  Use "CROSSOVER " technique two times a day as needed for nasal congestion.  Use Eucerin two times a day as needed for septal drying. Prescriptions: LORATADINE 10 MG TABS (LORATADINE) 1 once daily as needed for allergies  #90 x 3   Entered and Authorized by:   Marga Melnick MD   Signed by:   Marga Melnick MD on 06/14/2010   Method used:   Print then Give to Patient   RxID:   [id number] RANITIDINE HCL 150 MG 1 pre meals as needed  #180 x 3   Entered and Authorized by:   Marga Melnick MD   Signed by:   Marga Melnick MD on 06/14/2010   Method used:   Print then Give to Patient   RxID:   [id number]    Orders Added: 1)  Est. Patient Level III [03474]

## 2010-09-11 NOTE — Assessment & Plan Note (Signed)
OFFICE VISIT   David [name], David [name]  DOB:  08-Apr-1961                                        May 23, 2008  CHART #:  [id number]   HISTORY OF PRESENT ILLNESS:  The patient returns for routine followup  status post right miniature thoracotomy for mitral valve repair on  March 22, 2008, due to severe mitral regurgitation secondary to  bacterial endocarditis.  The patient was last seen here in our office on  April 04, 2008.  Since then, he has done well.  He completed his dose  of antibiotics.  He has been seen in followup by Dr. [name] and  continues to do well.  He now is no longer taking any medications  including Coumadin.  He has not had any further problems or complaints.  He reports that he is essentially back to normal physical activity.  He  has not had any shortness of breath.  He has very mild residual soreness  in the area of his surgical incision, and this does not seem to limit  him much if at all.  He has been under some stress and that his mother  passed away last week.  The remainder of his review of systems is  unrevealing.   CURRENT MEDICATIONS:  None.   PHYSICAL EXAMINATION:  GENERAL:  The patient is well-appearing male.  VITAL SIGNS:  Blood pressure 121/79, pulse is 110 and regular.  A 2-  channel telemetry rhythm strip demonstrates sinus tachycardia.  Oxygen  saturation is 100%.  HEENT:  Unrevealing.  CHEST:  A miniature thoracotomy incision that has healed nicely.  Breath  sounds are clear to auscultation and symmetrical bilaterally.  No  wheezes or rhonchi are noted.  CARDIOVASCULAR:  Somewhat accelerated heart rate with regular rhythm.  No murmurs, rubs, or gallops are noted.  ABDOMEN:  Soft, nontender.  EXTREMITIES:  Warm and well perfused.  There is no lower extremity  edema.   IMPRESSION:  The patient appears to be doing quite well.  His heart rate  is somewhat up today, although he has been somewhat stressed due to  the  passing of his mother last week.  He is not taking any medications at  this time, and I think it is certainly reasonable for him to be off of  Coumadin at this point.   PLAN:  We will ask the patient to return to see [place] in 6 months just to  make sure he continues to do well.  I have suggested that he keep an eye  on his pulse rate to make sure that he might benefit from going back on  a beta-blocker if sinus tachycardia persists.  All of his questions have  been addressed.   Salvatore Decent. Cornelius Moras, M.D.  Electronically Signed   CHO/MEDQ  D:  05/23/2008  T:  05/23/2008  Job:  [id number]   cc:   Madolyn Frieze. [name], MD, [facility]

## 2010-09-11 NOTE — Consult Note (Signed)
David [name], David [name]               ACCOUNT NO.:  [id number]   MEDICAL RECORD NO.:  [id number]          PATIENT TYPE:  INP   LOCATION:  4731                         FACILITY:  [facility]   PHYSICIAN:  Charlynne Pander, D.D.S.DATE OF BIRTH:  10/11/1960   DATE OF CONSULTATION:  03/15/2008  DATE OF DISCHARGE:                                 CONSULTATION   David Conners. [name] is a 50 year old male referred by Dr. Tressie Stalker for  dental consultation.  The patient with recent identification of  infective endocarditis with vegetation involving the mitral valve.  The  patient with anticipated mitral valve replacement/repair with Dr.  Tressie Stalker in future.  The patient now seen as part of pre heart  valve surgery dental protocol evaluation.   MEDICAL HISTORY:  1. Infective endocarditis.      a.     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  This was       treated with IV antibiotic therapy.      b.     Recently diagnosed Infective Endocarditis this admission and       microbiology studies pending.  The patient is on current IV       antibiotic therapy with vancomycin and gentamicin.  2. Mitral regurgitation with vegetation involving the mitral valve.      The patient with anticipated mitral valve replacement/repair with      Dr. Cornelius Moras in the future.  3. Anemia.  4. Status post cardiac catheterization, which revealed normal coronary      arteries.  The patient with an ejection fraction of approximately      70%.  Mitral valve regurgitation and vegetation noted.   ALLERGIES:  BIAXIN.   MEDICATIONS:  1. Vancomycin 750 mg IV every 12 hours.  2. Gentamicin 80 mg IV every 12 hours.  3. Lovenox 40 mg subcutaneously every 24 hours, currently being held      for anticiapted dental medicine extractions.   SOCIAL HISTORY:  The patient is married.  The patient with previous use  of tobacco products but quit in 1989.  The patient with rare use of  alcohol.   FAMILY HISTORY:  Negative  for coronary artery disease.   FUNCTIONAL ASSESSMENT:  The patient was independent for ADLs prior to  this admission.   REVIEW OF SYSTEMS:  This is reviewed from the chart and health history  assessment form on this admission.   CHIEF COMPLAINT:  The patient is seen as part of pre-heart valve surgery  dental protocol evaluation.   HISTORY OF PRESENT ILLNESS:  The patient recently identified to have  infective endocarditis.  The patient also with mitral valve  regurgitation with vegetation involving the mitral valve.  The patient  with anticipated mitral valve replacement/repair with Dr. Cornelius Moras.  The  patient is now seen as part of a pre heart valve surgery dental protocol  evaluation.   The patient currently denies acute toothache, swellings, or abscesses.  The patient is not seen a dentist for a long time.  The patient knows  that he has several bad teeth.   PHYSICAL  EXAMINATION:  General:  The patient is a well-developed and  well-nourished male in no acute distress.  VITAL SIGNS:  Blood pressure is 97/59, pulse rate 93, and temperature  98.1.  NECK:  There is no palpable lymphadenopathy.  The patient denies acute  TMJ symptoms.   INTRAORAL EXAM:  The patient with normal saliva.  There is no evidence  of abscess formation within the mouth.   DENTITION:  The patient with missing tooth #1, #2, #9, # 16, #18, #31,  and #32.  Tooth #19 is present as retained root segments.   DENTAL CARIES:  Dental caries noted to be involving tooth #17 and #19.  There may be other incipient dental caries, but would need a full series  of dental radiographs to identify the incipient dental caries.   ENDODONTIC:  The patient currently denies acute pulpitis symptoms.  The  patient appears to have periapical pathology involving tooth #17 and #19  at this time.   PERIODONTAL:  The patient with chronic advanced periodontal disease with  plaque and calculus accumulations, generalized gingival  recession, and  tooth mobility.  Tooth #7 and #8 has significant tooth mobility and  should be extracted at this time.  The patient is aware of the need for  followup evaluation by general dentist or with some periodontal referral  as indicated.   CROWN OR BRIDGE:  There are no crown or bridge restorations.   PROSTHODONTIC:  The patient denies presence of dentures.   OCCLUSION:  The patient with a poor occlusal scheme secondary to  multiple missing teeth, supereruption and drifting of the unopposed  teeth into the edentulous areas and lack of replacement of the missing  teeth with dental prostheses.   RADIOGRAPHIC INTERPRETATION:  A panoramic x-ray was taken on March 15, 2008.   There are multiple missing teeth.  There is supereruption and drifting  of the unopposed teeth into the edentulous areas.  There are multiple  areas of periapical pathology.  Dental caries are noted.  Multiple  diastemas are noted.  There is a poor occlusal scheme noted.   ASSESSMENT:  1. History of previous infective endocarditis with another episode      this admission with microbiology pending.  The patient currently on      IV antibiotic therapy.  2. Mitral regurgitation with a vegetation involving the mitral valve.      The patient with anticipated mitral valve replacement in the      future.  3. Multiple missing teeth.  4. Retained root segment in the area of #19.  5. Several areas of periapical pathology.  6. No history of acute pulpitis symptoms.  7. Chronic periodontitis with bone loss.  8. Generalized gingival recession.  9. Tooth mobility.  10.Poor occlusal scheme.  11.No history of partial dentures.  12.History of oral neglect.  13.Lovenox therapy with risk for bleeding with invasive dental      procedures.  14.Current IV antibiotic therapy.   PLAN/RECOMMENDATIONS:  1. I discussed the risks, benefits, complications, and various      treatment options with the patient in relation  to his medical and      dental conditions, current infective endocarditis and risk for      future endocarditis.  We discussed various treatment options to      include no treatment, multiple extractions with alveoloplasty,      periodontal therapy, dental restorations, root canal therapy, crown      or bridge therapy, implant  therapy, and replacing missing teeth as      indicated.  The patient currently wishes to proceed with extraction      of tooth #7, #8, #17, and #19 with alveoloplasty and pre prosthetic      surgery as indicated.  The patient will also entertain additional      extractions if they are deemed necessary at the time of the      operating room procedure.  The patient also agrees to gross      debridement of the remaining teeth at this time to assist in oral      hygiene maintenance.  The patient will be maintained on      chlorhexidine rinses twice daily for disinfection of the oral      cavity while waiting for anticipated mitral valve      replacement/repair.  2. Discussion of findings with Cardiology and Thoracic Surgery team as      indicated and coordination of future mitral valve      replacement/repair as indicated.      Charlynne Pander, D.D.S.  Electronically Signed     RFK/MEDQ  D:  03/15/2008  T:  03/16/2008  Job:  [id number]   cc:   Salvatore Decent. Cornelius Moras, M.D.  Madolyn Frieze [name], MD, [facility]  Arturo Morton. Riley Kill, MD, [facility]

## 2010-09-11 NOTE — Consult Note (Signed)
NAMECASTEN, FLOREN               ACCOUNT NO.:  [id number]   MEDICAL RECORD NO.:  [id number]          PATIENT TYPE:  INP   LOCATION:  4731                         FACILITY:  [facility]   PHYSICIAN:  Salvatore Decent. Cornelius Moras, M.D. DATE OF BIRTH:  Aug 23, 1960   DATE OF CONSULTATION:  03/15/2008  DATE OF DISCHARGE:                                 CONSULTATION   REQUESTING PHYSICIAN:  Bevelyn Buckles. Bensimhon, MD   PRIMARY CARDIOLOGIST:  Madolyn Frieze. [name], MD, [facility]   PRIMARY CARE PHYSICIAN:  Titus Dubin. Hopper, MD,FACP,FCCP   REASON FOR CONSULTATION:  Bacterial endocarditis with severe mitral  regurgitation.   HISTORY OF PRESENT ILLNESS:  Mr. Resnick is a 50 year old African American  male with known history of subacute bacterial endocarditis in July 2001.  At that time, he was diagnosed with endocarditis related to  enterococcus.  He was treated with 6 weeks of intravenous antibiotics  and did well clinically.  All follow-up cultures were negative.  The  patient was noted at that time to have moderate-to-severe mitral  regurgitation that remained asymptomatic.  He has been followed  clinically since then.  His last echocardiogram was performed in  February 2009, and at that time, the patient was noted to have normal  left ventricular function with mild left ventricular chamber enlargement  and stable moderate-to-severe mitral regurgitation.  Approximately 4  weeks ago, the patient was lifting a box at work and developed lower  back pain.  The pain worsened ultimately prompting medical evaluation  and an MRI was performed.  This demonstrated mild degenerative disk  disease of the lumbosacral spine without significant neural impingement  or stenosis.  The patient has been treated with physical therapy and his  back pain has slowly improved.  However, since that time, the patient  has noted persistent intermittent night sweats and fevers.  He was seen  by Dr. Drue Novel as an outpatient and diagnosed with  anemia.  He was again  seen by Dr. Alwyn Ren and began taking his temperature.  He had a fever of  102 degrees and subsequently returned to see Dr. [name] for further  followup.  He was admitted to the hospital on March 11, 2008.  The  patient was anemic with baseline hemoglobin of 10.7.  White blood count  was normal at 7,800.  Sedimentation rate was mildly elevated at 27.  Blood cultures were obtained and are growing Gram-positive cocci in  chains.  The patient was admitted to the hospital and started on broad-  spectrum intravenous antibiotics including vancomycin and gentamicin.  Transesophageal echocardiogram was performed on March 14, 2008, by  Dr. Gala Romney.  Findings were notable for the presence of what appears  to be vegetation on the anterior leaf of the mitral valve and severe  wide-open 4+ mitral regurgitation.  There was normal left ventricular  function.  No other significant abnormalities were noted.  Cardiothoracic surgical consultation was requested.  Mr. Wich underwent  left and right heart catheterization this morning by Dr. Riley Kill.  He  was found to have normal coronary artery anatomy with no significant  coronary artery  disease.  Pulmonary artery pressures were elevated and  measured 40/18 with pulmonary capillary wedge pressure of 21 and a large  V-wave of 30 mmHg consistent with severe mitral regurgitation.  Abdominal aortogram revealed no significant aortoiliac disease.   REVIEW OF SYSTEMS:  GENERAL:  The patient reports that he had lost his  appetite a little bit prior to admission, but since getting started on  IV antibiotics, his appetite is back to normal.  He has not been gaining  nor losing weight.  He otherwise feels well.  CARDIAC:  The patient  denies any shortness of breath either with activity or rest.  Prior to  injuring his back in October, he was fairly active physically and had  not been having problems with exertional shortness of breath.   He  specifically denies PND, orthopnea, lower extremity edema.  He has not  had chest pain nor tachy palpitations.  RESPIRATORY:  Negative.  The  patient denies productive cough, hemoptysis, wheezing.  GASTROINTESTINAL:  Negative.  The patient has no difficulty swallowing.  He denies hematochezia, hematemesis, melena.  MUSCULOSKELETAL:  Notable  for mild back pain that is considerably improved over the last month  with physical therapy.  He otherwise has no aches or pains or  constitutional complaints.  NEUROLOGIC:  Negative.  The patient denies  transient monocular blindness, transient numbness or weakness involving  in either upper or lower extremity.  GENITOURINARY:  Negative.  HEENT:  Negative.  INFECTIOUS:  Notable for fevers prior to admission.  The  patient has not had any fever since admission and starting antibiotics.  PSYCHIATRIC:  Negative.   PAST MEDICAL HISTORY:  Notable for the absence of any history of  hypertension, hyperlipidemia, diabetes mellitus.  The patient had  enterococcal bacterial endocarditis in 2001 with known moderate-to-  severe mitral regurgitation.   PAST SURGICAL HISTORY:  Notable for chest tube placement for right  spontaneous pneumothorax within 25 years ago.  The patient did not have  surgery at that time.   SOCIAL HISTORY:  The patient is recently separated from his wife.  He  has three children.  He is a nonsmoker.  He denies any history of  alcohol or drug use.   MEDICATIONS PRIOR TO ADMISSION:  None.   DRUG ALLERGIES:  BIAXIN.   PHYSICAL EXAMINATION:  GENERAL:  The patient is a thin, well-appearing  African American male who appears his stated age in no acute distress.  He is afebrile.  HEENT:  Unrevealing.  NECK:  Supple.  There is no  palpable cervical or supraclavicular lymphadenopathy.  There is no  jugular venous distention.  No carotid bruits are noted.  Auscultation  of the chest reveals clear breath sounds anteriorly.  No wheezes  or  rhonchi are noted.  CARDIOVASCULAR:  Regular rate and rhythm.  There is  a prominent grade 4/6 holosystolic murmur heard at the apex with  radiation all across the precordium to the axilla and back.  No  diastolic murmurs are noted.  ABDOMEN:  Soft, nondistended and  nontender.  The spleen is not palpable.  Bowel sounds are present.  EXTREMITIES:  Warm and well perfused.  There is no lower extremity  edema.  The left femoral pulses are easily palpable and normal.  The  patient has a sheath in the right femoral artery.  Distal pulses are  palpable in the posterior tibial and dorsalis pedis position.  There is  no lower extremity edema.  SKIN:  Clean, dry and  healthy-appearing  throughout.  There is no peripheral stigmata of endocarditis such as  Janeway lesions or Roth spots.  RECTAL and GU:  Both deferred.   DIAGNOSTIC TEST:  Transesophageal echocardiogram performed by Dr.  Gala Romney is reviewed.  This demonstrates what may represent vegetation  on the atrial surface of the anterior leaflet of the mitral valve.  There was severe wide-open mitral regurgitation.  There appears to be a  defect in the posterior leaflet of the mitral valve with a broad jet  that is central and directed somewhat eccentrically anteriorly  throughout the entire left atrium.  Appears to be a segment of the  posterior leaflet which is flail.  Left atrium is mildly enlarged.  The  left ventricle is mildly enlarged.  There is normal left ventricular  systolic function.  The aortic valve appears normal with trivial aortic  insufficiency.  There is trivial tricuspid regurgitation.  No other  abnormalities are noted.   Left and right heart catheterization performed by Dr. Riley Kill is  reviewed.  This demonstrates normal coronary artery anatomy with no  significant coronary artery disease.  Pulmonary artery pressures are as  mentioned previously.   IMPRESSION:  Likely recurrent bacterial endocarditis with severe  mitral  regurgitation.  Mr. Muegge does not have any clinical signs or symptoms  of congestive heart failure.  However, he has wide-open mitral  regurgitation with a flail segment of the posterior leaflet and some  signs of left ventricular chamber enlargement.  He appears to be  responding rapidly to intravenous antibiotics with complete resolution  of his febrile illness.  I suspect he would best be treated with  elective mitral valve repair or replacement sooner rather than later.  He needs a Dental Service consult.  We will await input from the  Infectious Disease consultants regarding the specific bacteria  identified from blood cultures.  We need to make sure there is no other  untreated source of infection.  If he otherwise continues to do well, we  could plan for surgery this hospitalization or alternatively allow him  to complete his course of intravenous antibiotics and return for surgery  later.  Given the severity of mitral regurgitation, it might make more  sense to proceed with surgery soon.  I have discussed matters at length  with Mr. Laughter and his sister and brother-in-law.  All of their  questions have been addressed.  They understand that we would attempt to  repair his mitral valve if possible, although it may be that valve  repair will not be feasible due to the amount of destruction of the  valve leaflets as suggested by the transesophageal echocardiogram.  If  valve repair is not feasible, we would replace his valve using a  mechanical prosthesis.  Mr. Tubby understands that this would be  associated with the need for long-term anticoagulation.  Alternative  treatment strategies have been reviewed.  All of their questions have  been addressed.      Salvatore Decent. Cornelius Moras, M.D.  Electronically Signed     CHO/MEDQ  D:  03/15/2008  T:  03/15/2008  Job:  [id number]   cc:   Madolyn Frieze. [name], MD, [facility]  Bevelyn Buckles. Bensimhon, MD  Titus Dubin. Alwyn Ren, MD,FACP,FCCP

## 2010-09-11 NOTE — Op Note (Signed)
David [name], David [name]               ACCOUNT NO.:  [id number]   MEDICAL RECORD NO.:  [id number]          PATIENT TYPE:  INP   LOCATION:  2305                         FACILITY:  [facility]   PHYSICIAN:  Salvatore Decent. Cornelius Moras, M.D. DATE OF BIRTH:  Jul 24, 1960   DATE OF PROCEDURE:  03/22/2008  DATE OF DISCHARGE:                               OPERATIVE REPORT   PREOPERATIVE DIAGNOSIS:  Bacterial endocarditis with severe mitral  regurgitation.   POSTOPERATIVE DIAGNOSIS:  Bacterial endocarditis with severe mitral  regurgitation.   PROCEDURE:  Right miniature thoracotomy for mitral valve repair (Core-  Matrix patch reconstruction of the anterior leaflet, artificial Gore-Tex  chord replacement with plication of flail posterior leaflet, #26-mm  Edwards Physio II ring annuloplasty).   SURGEON:  Salvatore Decent. Cornelius Moras, MD   ASSISTANT:  Coral Ceo, PA   ANESTHESIA:  General.   BRIEF CLINICAL NOTE:  The patient is a 50 year old African American male  who has history of subacute bacterial endocarditis in July 2001, that  was treated medically.  The patient did well, although he has been  followed since then with moderate mitral regurgitation.  The patient now  presents with febrile illness and blood cultures growing mitral air-  filled Streptococcus species.  Transesophageal echocardiogram  demonstrates vegetations on the anterior leaflet of the mitral valve  with severe (4+) mitral regurgitation.  Cardiac catheterization reveals  normal coronary artery anatomy with no significant coronary artery  disease.  There is pulmonary hypertension.  The patient has been treated  with intravenous antibiotics and his febrile illness resolved entirely.  The patient now is brought to the operating room for elective surgical  intervention for definitive management of his severe mitral  regurgitation.  The patient and his family have been counseled at length  regarding the indications, risks, and potential benefits of  surgery.  Alternative treatment strategies have been discussed.  They understand  and accept all potential associated risks and desire to proceed with  surgery as described.   OPERATIVE FINDINGS:  1. Large perforation in the anterior leaflet of the mitral valve with      active vegetations.  2. Flail segment of the posterior leaflet with ruptured chordae      tendineae (P3).  3. Severe mitral regurgitation  4. No residual mitral regurgitation following successful mitral valve      repair.   OPERATIVE NOTE IN DETAIL:  The patient was brought to the operating room  on the above-mentioned date and central monitoring was established by  the Anesthesia team under the care and direction of Dr. Kipp Brood.  Specifically, a Swan-Ganz catheter was placed through the left  subclavian approach.  A radial arterial line was placed.  Intravenous  antibiotics were administered.  General endotracheal anesthesia was  induced uneventfully.  A Foley catheter was placed.  The patient was  positioned in the supine position on the operating table with a long  roll underneath the right scapula to elevate the right side slightly.  The patient's neck was turned towards the left and gently extended.  The  patient's right neck, chest, abdomen, both groins,  and both lower  extremities were prepared and draped in a sterile manner.   Baseline transesophageal echocardiogram was performed by Dr. Noreene Larsson.  This confirmed the presence of severe (4+) mitral regurgitation.  There  appeared to be a perforation in the middle of the anterior leaf of the  mitral valve.  There was also a flail segment of the posterior leaflet  of the mitral valve.  It was close to the posterior commissure.  The  aortic valve appeared normal.  There was trace tricuspid regurgitation.  Left ventricular function was normal, although the left ventricle was  somewhat dilated.  The left atrium was dilated.  No other abnormalities  were  noted.   A small incision was made in the left inguinal crease and the left  common femoral artery and left common femoral vein were dissected away  from associated structures and inspected anteriorly.  The femoral artery  was normal and soft on palpation.   A right miniature thoracotomy incision was performed just lateral to the  right nipple.  Single lung ventilation was begun.  The right pleural  space was entered through the fourth intercostal space.  There were some  adhesions around the lung, presumably related to the patient's remote  history of spontaneous pneumothorax.  These adhesions were divided with  electrocautery.  A 10-mm port was placed inferiorly in the anterior  axillary line through the sixth intercostal space.  Through this port,  the chest was insufflated with carbon dioxide gas continuously  throughout the remainder of the duration of the operation.  A  longitudinal incision was made in the pericardium, 2 cm anterior to the  phrenic nerve.  Self retraction sutures were placed to retract the  pericardium anteriorly and posteriorly.   The patient was heparinized systemically.  Concentric purse-string Gore-  Tex sutures were placed both, on the anterior surface of the left common  femoral artery and on the anterior surface of the left common femoral  vein.  The common femoral vein was now cannulated using a Seldinger  technique through the purse-string suture and a long flexible guidewire  was advanced up through the inferior vena cava, through the right atrium  into the superior vena cava using transesophageal echocardiogram for  guidance.  The femoral vein was then dilated with serial dilators and a  22-French long femoral venous cannula was then advanced up through the  femoral vein, through the inferior vena cava, through the right atrium  until the tip of the cannula extends up into the superior vena cava,  again using transesophageal echocardiogram for  guidance.  The right  internal jugular vein was now cannulated using a Seldinger technique and  subsequently dilated with serial dilators.  A 16-French femoral cannula  was advanced after serial dilatation through the internal jugular vein  until the tip extends into the superior vena cava for dual venous  drainage.  The left common femoral artery was now cannulated using a  Seldinger technique through the concentric purse-string sutures on the  anterior surface.  The femoral artery was dilated and a 18-French  femoral arterial cannula was placed.   Cardiopulmonary bypass was begun and vacuum-assisted venous drainage was  utilized.  A purse-string suture was placed in the right atrium, and a  retrograde cardioplegic cannula was placed through the right atrium into  the coronary sinus using transesophageal echocardiogram for guidance.  An antegrade cardioplegic cannula was placed on the anterolateral  surface of the ascending aorta just above the  right atrial appendage.   The patient was cooled to 28 degrees systemic temperature.  The aortic  crossclamp was applied and cold blood cardioplegia was delivered  initially in an antegrade fashion through the aortic root.  Supplemental  cardioplegia was administered retrograde through the coronary sinus  catheter.  Repeat doses of cardioplegia were administered intermittently  every 20 minutes throughout the crossclamp portion of the operation.  The retrograde through the coronary sinus catheter were antegrade  through the aortic root to maintain completely isoelectric  electrocardiogram throughout the duration of the operation.  Myocardial  protection was felt to be excellent.   A left atriotomy incision was performed posteriorly through the  interatrial groove.  The left atrial retractor was placed and the mitral  valve was exposed.  Exposure was felt to be excellent.  The mitral valve  was carefully examined.  There were active vegetations  on the atrial  surface of the anterior leaflet of the mitral valve with a large  perforation in the middle portion of this leaflet.  This was the only  area of the valve that appeared to have active vegetations.  There was a  flail segment of the posterior leaflet (P3) adjacent to the posterior  commissure.  This appeared to be related to the patient's remote history  of endocarditis in 2001, in this leaflet tissue, although flail has no  associated vegetations.  The remainder of the posterior leaflet was  normal.  The subvalvular apparatus was normal.  No other abnormalities  were noted.   The active vegetations in the anterior leaflet were excised sharply  creating a large football-shaped defect in the middle of the anterior  leaflet of the mitral valve.  Once all of the infected tissue has been  debrided sharply, the mitral valve, left atrium, and left ventricle were  irrigated with copious saline solution and subsequently painted with  Betadine solution.  The valve was then irrigated again and all  instruments were changed.  Swab cultures were obtained prior to  debridement and sent for routine culture and sensitivity.   The large defect in the anterior leaflet of the mitral valve was  prepared using a football-shaped patch of Core-Matrix material.  The  patch was soaked in saline solution for 10 minutes prior to  implantation.  The patch was then trimmed to an appropriate shape and  size, and the patch was reimplanted using a two-layer closure of running  CV-6 Gore-Tex suture.   Artificial Gore-Tex chord replacement was utilized to correct the flail  segment of the posterior leaflet without any leaflet resection.  The CV-  2 Gore-Tex pledgeted sutures placed through the head of the posterior  papillary muscle and tied.  A second CV-2 Gore-Tex suture was then  placed through the pledgeted portion of the head and left untied and the  sutures were gathered and placed into the left  ventricular chamber for  later retrieval.  A single K-commissuroplasty suture was placed at the  posterior commissure to reapproximate the flail segment of P3 to  commissure at the appropriate length.   Interrupted 2-0 Ethibond sutures were placed circumferentially around  the entire mitral annulus for subsequent ring annuloplasty.  The valve  was sized to accept a 26-mm annuloplasty ring which corresponded to the  dimensions of the anterior leaflet.  The Edwards Physio II annuloplasty  ring (model number 5300, serial number [id number]) was secured in place  uneventfully.  The Gore-Tex artificial chord sutures were now retrieved  from the left ventricular chamber and woven into the flail segment of  the posterior leaflet (P3).  The initial bite was placed on the  ventricular surface of the free margin of the flail leaflet and then 2  additional bites were placed.  The left ventricle was now filled with  saline solution and distended, and the Gore-Tex artificial chords were  tied to an appropriate length with the ventricle fills.  The valve  appeared to be perfectly competent on saline testing.  Rewarming was  begun.   A left ventricular vent was placed across the mitral valve.  The left  atriotomy incision was now closed using a 2-layer closure of running 3-0  Prolene suture.  Epicardial pacing wire was fixed to the right  ventricular free wall and a 28-French Bard drain was placed through a  separate stab incision anteriorly and placed into the pericardial sac.  One final dose of warm retrograde hot shot cardioplegia was  administered.  The aortic crossclamp was removed after a total  crossclamp time of 171 minutes.   The heart began to beat spontaneously without need for cardioversion.  Epicardial pacing wires were fixed to the right atrium.  The patient was  rewarmed to 37 degrees centigrade temperature.  The lungs were  ventilated and the heart allowed to fill briefly, after which  time the  left ventricular vent was removed and the left atriotomy closure was  completed.  An additional epicardial pacing wire was fixed in the right  ventricular free wall surface due to some inconsistent capture with the  originally placed ventricular pacing lead.   The patient was weaned from cardiopulmonary bypass without difficulty.  The patient's rhythm at separation from bypass was slow junctional  escape rhythm.  A-V sequential pacing was employed.  No inotropic  support was required.  Total cardiopulmonary bypass time of the  operation was 213 minutes.   Followup transesophageal echocardiogram was performed by Dr. Noreene Larsson  after separation from bypass.  This demonstrated a well-seated  annuloplasty ring in the mitral position with a normal functioning  mitral valve.  There was no residual mitral regurgitation.  There was  insignificant residual air.  No other abnormalities were noted.   Protamine was administered to reverse the anticoagulation.  The right IJ  cannula was now removed and the cannulation site was closed with a  single #1 silk suture over red rubber pledgets through and through the  skin and direct pressure was applied.  The femoral venous cannula was  now removed and its cannulation purse-string suture was secured.  The  femoral arterial cannula was removed and the purse-string suture was  secured.  There was a palpable pulse in the distal femoral artery.   Single lung ventilation was begun.  The atriotomy incision was inspected  for hemostasis.  The pericardium was now closed with interrupted silk  sutures.  The right pleural space was drained with a 28-French Bard  drain placed through the original port incision inferiorly.  The On-Q  continuous pain management system was utilized to facilitate  postoperative pain control.  One 5-inch catheter was supplied with the  On-Q kit.  It was tunneled through the deep subcutaneous tissues from  anteriorly to the  posterior chest tube incision and then tunneled into  the subpleural space to cover the second through the sixth intercostal  space.  The catheter was flushed with 5 mL of 0.5% bupivacaine solution  and ultimately connected to a continuous infusion pump.  The miniature  thoracotomy incision was closed in multiple layers and the skin incision was closed with a subcuticular.  The groin incision was inspected for  hemostasis and subsequently closed in multiple layers.  The skin  incision was closed with subcuticular skin closure.   The patient tolerated the procedure well and was transported to the  Surgical Intensive Care Unit in stable condition after reintubation with  a single-lumen endotracheal tube in the operating room prior to  transport.  There were no intraoperative complications.  All sponge,  instrument, and needle counts were verified as correct at the completion  of the operation.  The patient was transfused 2 units packed red blood  cells during cardiopulmonary bypass due to anemia, which was present  prior to surgery and exacerbated with hemodilution.  The patient also  received 2 units of packed red blood cells after separation from bypass  due to anemia with ongoing volume requirement.  There were no  intraoperative complications.      Salvatore Decent. Cornelius Moras, M.D.  Electronically Signed     CHO/MEDQ  D:  03/22/2008  T:  03/23/2008  Job:  [id number]   cc:   Madolyn Frieze. [name], MD, [facility]  Bevelyn Buckles. Bensimhon, MD  Titus Dubin. Alwyn Ren, MD,FACP,FCCP

## 2010-09-11 NOTE — Cardiovascular Report (Signed)
David [name], David [name]               ACCOUNT NO.:  [id number]   MEDICAL RECORD NO.:  [id number]           PATIENT TYPE:   LOCATION:                                 FACILITY:   PHYSICIAN:  Arturo Morton. Riley Kill, MD, FACCDATE OF BIRTH:  05/15/1960   DATE OF PROCEDURE:  03/15/2008  DATE OF DISCHARGE:                            CARDIAC CATHETERIZATION   INDICATIONS:  Mr. Mcelhiney is a 50 year old gentleman who presents with  recurrent endocarditis and worsening mitral regurgitation.  Current  study was done to assess coronary anatomy and right heart pressures.  Dr. Barry Dienes came to the laboratory and also requested a distal aortogram  to help avoid need for CT scanning for surgical planning.   PROCEDURES:  1. Right heart catheterization.  2. Placement of left heart catheters without left heart      catheterization.  3. Selective coronary arteriography.  4. Distal aortography.   DESCRIPTION OF THE PROCEDURE:  The patient was brought to the  catheterization laboratory and prepped and draped in the usual fashion.  Because the patient is thin and the artery was palpable but felt to be  quite narrow, we elected to use a Smart needle.  Using a Smart needle,  we accessed the femoral vein.  A 7-French sheath was placed.  We then  put a standard Swan-Ganz catheter up into the superior vena cava where  saturation was obtained.  Right atrial and right ventricular pressures  were recorded.  Because of the angulation, we had difficulty getting the  standard Swan-Ganz catheter up into the pulmonary artery, so this was  exchanged for an S curve which gradually allowed [place] to gain access to  the pulmonary artery in wedge position.  Serial pressures were recorded.  Pulmonary artery saturation was obtained.  Thermodilution cardiac  outputs were performed without difficulty.  Following this, the right  heart catheter was removed, the sheath flushed.  Following this, the  femoral artery was then entered, and a  5-French sheath was placed.  Standard Judkins catheters were used to engage the left coronary artery.  The right coronary artery was obtained.  It was engaged with a Noto  catheter. Following this, as previously noted, Dr. Barry Dienes was in the  laboratory and he requested that we perform a distal aortogram.  The  pigtail was placed in just above the renal arteries and a distal  aortography performed without difficulty.  All catheters were  subsequently removed and then I subsequently spoke with the patient's  sister and her husband with approval of the patient.  He was taken to  the holding area for review of the findings with Dr. Barry Dienes.   HEMODYNAMIC DATA:  1. Right atrium 4.  2. RV 39/7.  3. Pulmonary artery 40/18 with a mean of 30.  4. Pulmonary capillary wedge mean 21 with a VET of 30.  5. Aortic 91/66, mean 79.  6. Superior vena cava saturation 71%.  7. Pulmonary artery saturation 77%.  8. Aortic saturation 98%.  9. Fick cardiac output 9.0 liters per minute.  10.Fick cardiac index 4.96 liters per minute per meter squared.  11.Thermodilution cardiac output 6.14 liters per minute.  12.Thermodilution cardiac index 3.37 liters per minute per meter      squared.   ANGIOGRAPHIC DATA:  1. The left main coronary artery is large and free of critical      disease.  2. The left anterior descending artery courses to the apex and      provides a major bifurcating diagonal.  The LAD and its branches      appear free of critical disease.  3. There is a very large intermediate vessel and a proximal marginal      that courses out over the anterolateral wall and appears to be free      of critical disease.  4. The AV circumflex is free of critical disease.  5. The right coronary artery is a vessel that provides predominantly a      single PDA and is free of critical disease.  6. Distal aortography reveals no evidence of renal artery stenosis and      no evidence of significant obstruction.    CONCLUSIONS:  1. Moderate pulmonary hypertension with elevated V wave, compatible      with mitral regurgitation.  2. No significant coronary obstruction.  3. No extensive distal aortic vascular disease.   DISPOSITION:  The patient will be seen by Dr. Barry Dienes and final surgical  plans made.      Arturo Morton. Riley Kill, MD, [facility]  Electronically Signed     TDS/MEDQ  D:  03/15/2008  T:  03/15/2008  Job:  [id number]   cc:   Madolyn Frieze. [name], MD, [facility]  Dr. Alfonse Spruce, M.D.

## 2010-09-11 NOTE — Assessment & Plan Note (Signed)
[facility]                            CARDIOLOGY OFFICE NOTE   NAME:David [name], David [name]                      MRN:          [id number]  DATE:06/11/2007                            DOB:          12/02/1960    David [name] is a 50 year old male who I have not seen since 2003.  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  His last echocardiogram was in June 2003 and showed normal LV function,  an oscillating density on the mitral valve consistent with prior SBE,  and moderate mitral regurgitation.  He has not followed up since then.  He was recently seen in the emergency room for chest pressure.  This was  on June 03, 2007.  The pain was in the lateral chest area.  It lasted  for 5-6 hours and he could not rest.  He states it improved with when he  moved in certain ways.  Note in the emergency room he had one set of  cardiac markers that were normal.  He also had a chest x-ray on May 29, 2007 that showed no acute disease but there was COPD.  Because of  his history of endocarditis/mitral regurgitation and chest pain, we were  asked to further evaluate.  Note he does not have dyspnea on exertion,  orthopnea, PND, pedal edema, palpitations, presyncope, syncope or  exertional chest pain.  He has on multivitamin daily as needed.  He has  no known drug allergies.   SOCIAL HISTORY:  Does not smoke and only occasionally consumes alcohol.   FAMILY HISTORY:  Negative for coronary artery disease.   PAST MEDICAL HISTORY:  He denies any hypertension, diabetes mellitus or  hyperlipidemia.  He does have a history of enterococcal endocarditis  with residual mitral regurgitation.  He has had no previous surgeries.  He does have a history of a pneumothorax requiring a chest tube in 1984.   REVIEW OF SYSTEMS:  He denies any headaches or fevers, chills.  There is  no  productive cough or hemoptysis.  There is no dysphagia, odynophagia,  melena or hematochezia.  There is no dysuria inch of his or seizure  activity.  There is no father PND or pedal edema.  The remaining systems  are negative.   EXAMINATION:  Today shows a blood pressure 142/81.  His pulse 87.  Weighs 157 pounds.  He is well-developed, well-nourished distress.  Skin is warm, dry.  Not  depressed.  There is no peripheral clubbing and there is no peripheral  stigmata of SBE.  His back is normal.  HEENT:  Is normal with normal eyelids.  His skin is normal.  His neck is supple with normal upstroke bilaterally.  No bruits heard.  There is no jugular venous distention and no thyromegaly is noted.  CHEST:  His chest is clear to auscultation.  Normal expansion.  CARDIOVASCULAR:  Regular rhythm.  Normal S1-S2.  There is a 3/6 systolic  murmur that the apex that radiates to the left axilla.  Abdominal exam nontender.  Positive bowel sounds.  No hepatosplenomegaly  no mass appreciated.  There is no abdominal bruit.  Note on his cardiac exam his PMI was not displaced.  He is 2+ femoral  pulses bilaterally.  No bruits.  EXTREMITIES:  Show no edema palpate no cords.  Is 2+ posterior tibial  pulses bilaterally.  NEUROLOGIC:  Grossly intact.   His electrocardiogram shows a sinus rhythm at a rate of 83.  The axis is  normal.  There is minimal criteria for LVH.   DIAGNOSES:  1. Atypical chest pain - his symptoms do not sound cardiac and may      have been musculoskeletal.  Electrocardiogram is normal.  Will not      pursue further ischemia evaluation at this time.  2. History of enterococcal endocarditis with residual moderate mitral      regurgitation - the patient did have a repeat echocardiogram      performed on June 03, 2007.  The left ventricle was felt to      mildly dilated with an end-systolic dimension of 33 mm and end-      diastolic dimension of 57 mm.  There was moderate thickening  of      mitral valve with a mild prolapse of the posterior leaflet.  There      was a small mobile density consistent with his prior endocarditis.      There is moderate mitral regurgitation.  Note David [name] has no      symptoms.  I therefore think we can follow this expectantly.  We      will plan to repeat his echocardiogram in 1 year.  I have explained      to him that he needs to be seen at least once per year to have      follow up echocardiograms as given his young age she will most      likely had need to have this mitral valve repair in the future.  I      have also instructed him to watch for any signs of increased      dyspnea.  If he does have increasing symptoms, if his left      ventricle end-systolic mention is greater than 40 mm or his LV      function deteriorates, we will plan to proceed with mitral valve      repair versus replacement that time.  He will continue with SBE      prophylaxis.  3. Mildly elevated blood pressure - he will track this at home and we      will treat as indicated.  Note he states this in the normal range      usually.   I will see him back in 1 year.     Madolyn Frieze [name], MD, [facility]  Electronically Signed    BSC/MedQ  DD: 06/11/2007  DT: 06/12/2007  Job #: [id number]

## 2010-09-11 NOTE — Discharge Summary (Signed)
NAMECORNEL, David [name]               ACCOUNT NO.:  [id number]   MEDICAL RECORD NO.:  [id number]          PATIENT TYPE:  INP   LOCATION:  3701                         FACILITY:  [facility]   PHYSICIAN:  Gordy Savers, MDDATE OF BIRTH:  [birth year]   DATE OF ADMISSION:  03/30/2008  DATE OF DISCHARGE:                               DISCHARGE SUMMARY   FINAL DIAGNOSES:  1. Intractable nausea, vomiting, and diarrhea.  2. Possible Clostridium difficile colitis (tox screen negative).  3. Dehydration.  4. History of endocarditis, status post mitral valve repair.  5. Chronic Coumadin.   DISCHARGE MEDICATIONS:  1. Rocephin 2 g daily until April 09, 2008.  2. Flagyl 500 mg t.i.d. for 5 additional days.  3. Coumadin 5 mg daily.  4. Toprol-XL 25 mg daily.   HISTORY OF PRESENT ILLNESS:  The patient is a 49 year old gentleman with  history of prior endocarditis who was status post thoracotomy with  mitral valve repair secondary to severe mitral valve regurgitation  related to prior endocarditis from Streptococcus viridans.  The patient  was discharged from the hospital on March 25, 2008, and has been on  parenteral Rocephin 2 g daily, scheduled to complete his regimen on  April 09, 2008.  Over the past few days, he became nauseated and  later developed worsening nausea with intractable vomiting and frequent  diarrhea.  He noted low-grade fever and presented to the emergency  department where he was noted to have tachycardia and mild hypotension.  The patient was subsequently admitted for further evaluation and  treatment of his volume contraction related to intractable nausea,  vomiting, and diarrhea.   LABORATORY DATA AND HOSPITAL COURSE:  The patient was admitted to the  hospital where he was maintained on his Rocephin 2 g daily  intravenously.  He was also maintained on Coumadin and Toprol.  The  patient was carefully rehydrated and treated symptomatically.  Stool  specimen for C.  diff toxin was obtained and was negative.  The patient  was empirically placed on metronidazole 500 mg t.i.d. and had a nice  clinical response.  It was felt prudent to continue metronidazole for  possible C. diff colitis.  Toxin negative.  He had some initial mild  elevation of liver function studies that resolved during the hospital.  White count was normal.  At the time of discharge after rehydration,  hemoglobin/hematocrit dropped to 9.4 and 28.1% respectively.  Chemistries normalized.  At time of discharge, he was tolerating a  normal diet without difficulties.  During the hospital period, the  patient had a transesophageal echocardiogram revealed ejection fraction  of 45% and no vegetations.   DISPOSITION:  The patient was discharged today to complete 5 additional  days of metronidazole.  He will complete his Rocephin through April 09, 2008.  He will continue his Coumadin and Toprol and follow up with  his primary care Ryhanna Dunsmore within the next several days.   CONDITION ON DISCHARGE:  Improved.      Gordy Savers, MD  Electronically Signed     PFK/MEDQ  D:  04/02/2008  T:  04/02/2008  Job:  [id number]

## 2010-09-11 NOTE — H&P (Signed)
NAMELUMAN, HOLWAY               ACCOUNT NO.:  [id number]   MEDICAL RECORD NO.:  [id number]          PATIENT TYPE:  INP   LOCATION:  1830                         FACILITY:  [facility]   PHYSICIAN:  Michiel Cowboy, MDDATE OF BIRTH:  17-Apr-1961   DATE OF ADMISSION:  03/31/2008  DATE OF DISCHARGE:                              HISTORY & PHYSICAL   PRIMARY CARE PHYSICIAN:  Dr. Alwyn Ren.   CHIEF COMPLAINT:  Nausea, vomiting, diarrhea, low-grade fever.   HISTORY OF PRESENT ILLNESS:  The patient is a 50 year old gentleman with  history of endocarditis who had just undergone miniature thoracotomy  with mitral valve repair secondary to severe mitral valve regurgitation  secondary to vegetation secondary to endocarditis with strep viridans.  His surgeon was Dr. Cornelius Moras.  His cardiologist is Dr. [name].  The  patient was discharged on March 25, 2008.  Of note, a few days prior  to discharge, he had a low-grade fever while in the hospital.  UA was  obtained and was unremarkable, and he was discharged to home.  After  discharge, yesterday he started to notice nausea, vomiting and diarrhea  with at least five very liquid stools which were very watery.  No blood  in stools noted.  The patient continued to take his antibiotics.  He  also noted a low-grade fever again, which brought him to the emergency  department where he was found to be tachycardic in the 120s and blood  pressures in the 90s.  In review of his records, actually while he was  here his blood pressure was in 90s and even 80s while an inpatient, and  also his heart rate was always somewhat elevated, although not as  dramatically, but also in the high 90s or 110s.   REVIEW OF SYSTEMS:  Significant for weight loss and also some back pain  for which he had been going to physical therapy, and had an MRI of the  spine done in October which showed moderate disk degeneration.  No focal  neurological impingement noted.  Otherwise, the  review of systems was  unremarkable.  No chest pain, no palpitations, just as per HPI.   PAST MEDICAL HISTORY:  Endocarditis status post mitral valve replacement  as per above.  Otherwise, history of allergies, and also had another  history of endocarditis in 2001.  The patient has very poor dentition.   SOCIAL HISTORY:  No drug abuse.  The patient does not drink or smoke.   FAMILY HISTORY:  Noncontributory.   ALLERGIES:  BIAXIN.   MEDICATIONS:  1. Rocephin 2 grams IV q.24h. which end date is April 09, 2008.  2. Coumadin.  3. Toprol XL 25 mg daily.  4. Oxycodone p.r.n.   PHYSICAL EXAMINATION:  VITALS:  Temperature 98.2 and then up to 100.5,  blood pressure 102/72, pulse initially 130 now down to 108 after IV  fluids, respirations 28 now down to 18, satting 98% on room air.  GENERAL:  The patient appears to be in no acute distress, lying down in  bed.  Appears to be thin.  HEAD:  Nontraumatic.  Somewhat  dry mucous membranes.  NECK:  No JVD noted.  LUNGS:  Clear to auscultation bilaterally.  HEART:  Rapid but regular.  No murmurs could be appreciated.  ABDOMEN:  Soft, nontender, nondistended.  EXTREMITIES:  Lower Extremities without edema, clubbing or cyanosis.  Strength 5/5 in all four extremities.  NEUROLOGIC:  Cranial nerves intact.   LABORATORY DATA:  White blood cell count 7.5, hemoglobin 13.3, sodium  139, potassium 3.5.  Creatinine 1.2.  LFTs within normal limits except  for AST of 49, ALT of 97, slightly up, albumin 3.4.   CHEST X-RAY:  Improving pneumothorax; otherwise unremarkable.   UA showed hyaline granular casts; otherwise unremarkable.  INR 2.3.   ASSESSMENT/PLAN:  This is a 50 year old gentleman with apparently a  second case of endocarditis, status post mitral valve repair with recent  discharge from the hospital on Rocephin.  Now with his nausea, vomiting  and diarrhea.  1. Nausea and vomiting and diarrhea.  Differential could include      either  gastroenteritis secondary to a viral source, but given the      low-grade fever and diarrhea, as well as antibiotic exposure,      Clostridium difficile could not be excluded.  Will admit for IV      fluid rehydration.  Will do stool studies.  Empirically start on      Flagyl.  This could be stopped if Clostridium difficile negative.  2. Clinical dehydration.  Will check orthostatics, give IV fluids and      monitor.  3. History of endocarditis.  Given persistent low-grade fever, we will      obtain repeat blood culture.  If fevers persist, may consider ID      consult.  4. Elevated liver function tests.  Given nausea and vomiting, we will      obtain a right upper quadrant ultrasound for further evaluation.  5. Prophylaxis - Protonix plus the patient is on Coumadin.  Would need      to find out from surgery/cardiology for how long they wanted to      continue Coumadin, since this was more of a valve repair rather      than replacement.  6. Weight loss and recurrent infections.  To be complete, will obtain      HIV panel since the patient is fairly young.   Dr. Rene Paci will assume care in a.m.      Michiel Cowboy, MD  Electronically Signed     AVD/MEDQ  D:  03/31/2008  T:  03/31/2008  Job:  [id number]   cc:   Dr. Alwyn Ren  Dr. Lana Fish S. [name], MD, [facility]

## 2010-09-11 NOTE — Op Note (Signed)
David [name]               ACCOUNT NO.:  [id number]   MEDICAL RECORD NO.:  [id number]          PATIENT TYPE:  INP   LOCATION:  2305                         FACILITY:  [facility]   PHYSICIAN:  Guadalupe Maple, M.D.  DATE OF BIRTH:  [birth year]   DATE OF PROCEDURE:  03/22/2008  DATE OF DISCHARGE:                               OPERATIVE REPORT   PROCEDURE:  Intraoperative transesophageal echocardiography.   [name] is a 50 year old male with a history of endocarditis  in 2001, and then subsequently was admitted 10 days ago with recurrent  endocarditis and severe mitral regurgitation.  He is now scheduled to  undergo mitral valve repair or replacement by Dr. Cornelius [name].  The  intraoperative transesophageal echocardiography was requested to  evaluate the mitral valve to assist with the repair and to assess for  other valvular pathology and to serve as a monitor for intraoperative  volume status.   The patient was brought to the operating room of [facility].  General anesthesia was induced without difficulty.  The trachea was  intubated without difficulty.  The transesophageal echocardiography  probe was inserted into the esophagus without difficulty.   IMPRESSION:  Prebypass Findings:  1. Aortic valve.  Aortic valve was trileaflet and opened normally.      There was no leaflet calcification.  There was trace aortic      insufficiency.  2. Mitral valve.  The mitral leaflets were thickened, and there was      severe mitral regurgitation present.  There appeared to be 2 major      foci of mitral regurgitation.  There appeared to be a perforation      of the anterior leaflet in the A2 region.  This was causing severe      mitral regurgitation.  There also appeared to be a flail segments      in the area of the posterior medial commissure, which involved the      posterior leaflet.  This would be the P3 region.  There were flail      segments seen but no frank vegetations  appreciated.  There was a      small jet of mitral regurgitation at the coaptation point, but this      appeared to be significantly less severe than the 2 foci at the      anterior leaflet mid anterior leaflet and posterior medial      commissure.  The mitral regurgitation was graded severe, and there      was systolic flow reversal in the left upper pulmonary vein.  3. Left ventricle.  The left ventricular cavity was dilated but was      vigorously contracting.  Ejection fraction was estimated at 65-70%.      There was good contractility in all segments interrogated.  Left      ventricular wall thickness measured 121 cm with end-diastole at the      mid papillary level of the posterior wall.  There was no thrombus      noted in the left ventricular apex.  4. Right  ventricle.  The right ventricular cavity appeared to be      within normal limits and size.  There was appeared to be normal      contractility of the right ventricular free wall.  5. Tricuspid valve.  The tricuspid valve showed a trace to 1+      tricuspid insufficiency and the tricuspid valve otherwise appeared      totally intact with no evidence of vegetation.  6. Interatrial septum.  Interatrial septum was intact without evidence      of atrial septal defect of patent foramen ovale.  There was a      prominent Chiari network noted in the right atrium.  7. Left atrium.  The left atrial cavity was enlarged.  There is no      thrombus in the left atrium or left atrial appendage.  8. Ascending aorta.  The ascending aorta appeared normal with no      significant atheromatous disease.  Well-defined aortic root inside      the tubular ridge.  9. Descending aorta.  The descending aorta appeared normal.  Free of      atheromatous disease and measured 1.82 cm in diameter.   Postbypass Findings:  1. Aortic valve.  Aortic valve appeared unchanged from the prebypass      study.  There were no vegetations noted.  There was normal  opening      of the valve.  There was trace aortic insufficiency.  2. Mitral valve.  There was an annuloplasty ring in the mitral      position.  There was a patch noted over the anterior leaflet.  The      posterior leaflet was immobile and the anterior leaflet was opening      freely.  There was no mitral regurgitation appreciated.  Mean      transmitral gradient measured 4.2 mmHg.  3. Left ventricle.  Left ventricular cavity appeared smaller than the      prebypass study.  There was good contractility in all segments      interrogated.  Ejection fraction estimated 60-65% wound.  There      were no wall motion abnormalities appreciated.  4. Right ventricle.  The right ventricular cavity appeared to be      within normal limits in size and was good contractility of the      right ventricular free wall.  5. Tricuspid valve.  The tricuspid valve again appeared structurally      normal with trace 1+ tricuspid insufficiency.           ______________________________  Guadalupe Maple, M.D.     DCJ/MEDQ  D:  03/22/2008  T:  03/23/2008  Job:  [id number]

## 2010-09-11 NOTE — Assessment & Plan Note (Signed)
OFFICE VISIT   ASHELY, JOSHUA R  DOB:  1960-10-05                                        April 04, 2008  CHART #:  [id number]   HISTORY OF PRESENT ILLNESS:  The patient returns for followup status  post right miniature thoracotomy for mitral valve repair on March 22, 2008, for bacterial endocarditis with severe mitral regurgitation.  His  initial postoperative recovery was entirely uneventful and he was  discharged home approximately 10 days ago.  He was readmitted to the  hospital last week with nausea, vomiting, dehydration, and severe  diarrhea.  He was treated with oral Flagyl for presumed C. difficile  colitis due to long-term antibiotic exposure.  C. diff toxin assays were  negative, but the patient improved dramatically on medical therapy and  he was subsequently discharged home again.  He returns to the office  today feeling quite well.  He states that the diarrhea resolved  completely.  His appetite is normal.  He has no abdominal pain.  He has  no shortness of breath.  He has minimal soreness and in fact, has not  taken any pain relievers for quite some time.  He denies any fevers or  chills.  He denies any tachy palpitations or dizzy spells.  He does have  some mild numbness in the right anterior chest wall consistent with his  recent miniature thoracotomy.  He has no other complaints.  The  remainder of his review of systems is unrevealing.   CURRENT MEDICATIONS:  Coumadin, metoprolol, IV Rocephin, and oral  Flagyl.   PHYSICAL EXAMINATION:  GENERAL:  Notable for well-appearing male.  VITAL SIGNS:  Blood pressure 107/72, pulse 100 and regular, and oxygen  saturation 100% on room air.  CHEST:  Clear breath sounds, which are symmetrical.  No wheezes or  rhonchi are noted.  CARDIOVASCULAR:  Demonstrates regular rate and rhythm.  No murmurs,  rubs, or gallops are noted.  The right anterolateral miniature  thoracotomy incision has healed  nicely.  The left groin incision has  healed nicely as well.  EXTREMITIES:  Distal pulses are palpable.  There is no lower extremity  edema.  ABDOMEN:  Soft, nondistended, nontender.  The remainder of his physical exam is unrevealing.   DIAGNOSTIC TEST:  Chest x-ray obtained today is reviewed.  This  demonstrates trivial bilateral pleural effusions with otherwise clear  lung fields bilaterally.  No other significant abnormalities are noted.   IMPRESSION:  Excellent progress following recent right miniature  thoracotomy for mitral valve repair.  The patient did develop what  presumably was Clostridium difficile colitis that responded rapidly to  oral Flagyl therapy.  He is now feeling quite well and looks very good.  He still has five more days of his planned course of IV Rocephin for his  streptococcus bacterial endocarditis.   PLAN:  I have reminded the patient that he will need to have his PICC  line removed as soon as his IV antibiotics have been completed.  I think  at this point, he can continue to increase his physical activity as  tolerated.  I think, he can return to driving an automobile.  He has no  specific physical limitations at this point with respect to his recent  surgery.  All of his questions have been addressed.  We would  plan to  keep him on Coumadin for another 6 or 8 weeks, although he could come  off of Coumadin if other medical conditions develop during the interim  period of time.  He has never had any atrial arrhythmias.  He is  otherwise doing quite well.   We will plan to have the patient return for followup in 6 weeks.  All of  his questions have been addressed.   Salvatore Decent. Cornelius Moras, M.D.  Electronically Signed   CHO/MEDQ  D:  04/04/2008  T:  04/04/2008  Job:  [id number]   cc:   Madolyn Frieze. [name], MD, [facility]  Bevelyn Buckles. Bensimhon, MD

## 2010-09-11 NOTE — Discharge Summary (Signed)
David [name], David [name]               ACCOUNT NO.:  [id number]   MEDICAL RECORD NO.:  [id number]           PATIENT TYPE:   LOCATION:                                 FACILITY:   PHYSICIAN:  David [name], M.D. DATE OF BIRTH:  1961/01/21   DATE OF ADMISSION:  DATE OF DISCHARGE:                               DISCHARGE SUMMARY   FINAL DIAGNOSIS:  Bacterial endocarditis with severe mitral  regurgitation.   IN-HOSPITAL DIAGNOSES:  1. Acute blood loss anemia, postoperatively.  2. Apical periodontitis.  3. Chronic periodontitis.  4. Multiple mobile teeth.  5. Accretion.   IN-HOSPITAL OPERATIONS AND PROCEDURES:  1. Right miniature thoracotomy for mitral valve repair, core matrix      patch reconstruction of the anterior leaflet, artificial Gore-Tex      cord replacement with plication of flail posterior leaflet, #26-mm      Edwards Physio II ring annuloplasty.  2. Extraction of tooth #7, #8, #17, #19 with alveoloplasty.  3. Gross debridement of remaining dentition.  4. Cardiac catheterization, right heart catheterization, placement of      left heart catheter without heart catheterization, selective      coronary arteriography, distal aortography.  5. Intraoperative transesophageal echocardiogram.   THE PATIENT'S HISTORY AND PHYSICAL AND HOSPITAL COURSE:  The patient is  a 50 year old African American male with known history of subacute  bacterial endocarditis in July 2001.  At that time, he was diagnosed  with endocarditis related to Enterococcus.  He was treated with 6 weeks  of intravenous antibiotics and did clinically well.  All followup  cultures were negative.  The patient has been followed since then with  moderate mitral regurgitation.  The patient now presents with febrile  illness and blood cultures growing mature air-filled Streptococcus  species.  Transesophageal echocardiogram done demonstrated vegetations  on the anterior leaflet and the mitral valve with severe 4+  mitral  regurgitation.  Cardiac catheterization revealed normal coronary artery  anatomy with no significant coronary artery disease.  The patient is  noted to have pulmonary hypertension.  Dr. Cornelius [name] was consulted following  TEE and cardiac catheterization.  Dr. Cornelius [name] saw and evaluated the  patient.  He discussed with the patient undergoing a mini-thoracotomy  for mitral valve repair.  He discussed risks and benefits with the  patient.  The patient acknowledged understanding and agreed to proceed.  Surgery was scheduled for March 22, 2008.  Prior to surgery,  Infectious Disease did see and evaluated the patient.  They started him  on IV antibiotics.  The patient remained on IV antibiotics post surgery.  Also preoperatively, the patient was seen and evaluated by Dr. Kristin Bruins.  Dr. Kristin Bruins inspected the patient and felt that he would require  several dental extractions.  He had diagnosed the patient with an apical  periodontitis, chronic periodontitis, multiple mobile teeth, and  accretion.  He took the patient to the operating room on March 16, 2008, where he performed extraction of tooth #7, #8, #17, and  #19 with alveoloplasty.  He also performed gross debridement of the  remaining dentition.  The  patient did tolerate this procedure well and  returned to his room.  The patient did undergo preoperative bilateral  carotid duplex ultrasound showing no significant evidence of ICA  stenosis.  The patient remained stable prior to surgery.  For further  details of the patient's past medical history and physical exam, please  see dictated H&P.   The patient was taken to the operating room on March 22, 2008, by Dr.  Cornelius [name] where he underwent right miniature thoracotomy for mitral valve  repair, core matrix patch reconstruction of anterior leaflet, artificial  Gore-Tex cord replacement with plication of flail posterior leaflet, #26-  mm Edwards Physio II ring annuloplasty.  The patient  tolerated this  procedure well and transferred to the intensive care unit in stable  condition.  Postoperatively, the patient was noted to be hemodynamically  stable.  He was extubated on the evening of surgery.  Post extubation,  the patient noted to be alert and oriented x4 and neuro intact.  Postoperatively, the patient was continued on IV Rocephin per Infectious  Disease.  Infectious Disease recommended the patient to continue IV  Rocephin until April 09, 2008.  A PICC line was placed.  Home hospice  nurse was consulted to assist with arrangements.  Postoperatively, the  patient in normal sinus rhythm.  Blood pressure was stable.  All drips  were weaned and discontinued.  He was started on low-dose beta-blocker.  The patient tolerated this well.  He remained in normal sinus rhythm  with blood pressure stable.  Daily chest x-rays obtained  postoperatively.  The patient had no pneumothorax noted.  Drainage was  monitored from his right chest tube.  The patient was encouraged to  continue using his incentive spirometer.  He was able to be weaned off  oxygen sating greater than 90% on room air.  On postop day 3, chest x-  ray remained stable.  Chest tube was discontinued.  We will repeat chest  x-ray in the a.m.  The patient did have mild acute blood loss anemia  postoperatively.  This was followed closely.  His hemoglobin/hematocrit  remained stable.  He did not require any transfusions.  The patient was  felt to be stable and ready for transfer to 2000 on postop day 1.  He  was started on Coumadin p.o. for his mitral valve repair.  Daily PT/INRs  obtained.  Coumadin was adjusted appropriately.  INR today is 1.6 and  external pacing wires ordered to be discontinued.  Postoperatively, the  patient was out of bed ambulating well with rehab.  He was tolerating  diet well, no nausea or vomiting noted.  All incisions noted to be  clean, dry, and intact and healing well.   Postop day 3,  the patient does have a low-grade temperature of 100.9.  Urinalysis was checked and noted to be negative.  We will continue to  monitor the patient's fever.  Currently, no superficial signs of  infection.  On IV Rocephin for endocarditis.  He is in normal sinus  rhythm.  Blood pressure is stable.  He is sating greater than 90% on  room air.  CBC showed a white count of 9.1, hemoglobin of 9.4,  hematocrit 27.5, platelet count 183.  BMP showed sodium of 136,  potassium of 3.4, chloride of 102, bicarb of 28, BUN of 6, creatinine of  0.98, glucose of 97.  We will repeat BMP in a.m. to check potassium.  Received potassium replacement today.  INR was 1.6.  All incisions were  noted to be clean, dry, and intact and healing well.  The patient is  tentatively ready for discharge home in the a.m. on postop day 4,  March 26, 2008, pending he remains stable.   FOLLOWUP APPOINTMENTS:  Followup appointment will be arranged with Dr.  Cornelius [name] for 1 week with chest x-ray.  Our office will contact the patient  with this information.  The patient will need to follow up with Dr.  [name] in 2 weeks.  He will need to contact Dr. Ludwig Clarks office to  make these arrangements.  The patient will also need to have his PT/INR  blood work done on March 29, 2008.  Home health nurse can do this and  fax the results to Dr. Ludwig Clarks office.   ACTIVITY:  The patient was instructed no driving until released to do  so, no heavy lifting over 10 pounds.  He is told to ambulate 3-4 times  per day, progress as tolerated, and continue his breathing exercises.   INCISIONAL CARE:  The patient is told to shower, washing his incisions  using soap and water.  He is to contact the office if he develops any  drainage or opening from any of his incision sites.   DIET:  The patient was educated on diet to be low fat, low salt.   DISCHARGE MEDICATIONS:  1. Toprol-XL 25 mg daily.  2. Coumadin will be dosed based on the  patient's discharge PT/INR      level.  3. Rocephin 2 g IV daily until April 09, 2008.  Home health nurse      arranged.  PICC line placed.  4. Oxycodone 5 mg 1-2 tablets q.4-6 h. p.r.n. pain.      Theda Belfast, PA      David [name], M.D.  Electronically Signed    KMD/MEDQ  D:  03/25/2008  T:  03/25/2008  Job:  [id number]   cc:   Madolyn Frieze. [name], MD, [facility]  Charlynne Pander, D.D.S.

## 2010-09-11 NOTE — Assessment & Plan Note (Signed)
OFFICE VISIT   David [name], David [name]  DOB:  [birth year]                                        November 23, 2008  CHART #:  [id number]   HISTORY OF PRESENT ILLNESS:  The patient returns to the office today for  late followup status post right miniature thoracotomy for mitral valve  repair on March 22, 2008.  He was last seen here in the office on  May 23, 2008.  Since then, he has continued to do quite well.  David [name] reports that he is back to normal physical activity.  He has no  physical restrictions whatsoever, and his exercise tolerance is good.  He has no shortness of breath with activity at all.  He has no chest  pain.  He feels well.  He has no complaints at all.   PAST MEDICAL HISTORY:  Unchanged and he has had no further medical  problems, since he was last seen.  He was seen in followup by Dr.  Jens [name] and will return to see him on an annual basis.   REVIEW OF SYSTEMS:  The remainder of his review of systems is entirely  unremarkable.   CURRENT MEDICATIONS:  1. Aspirin 325 mg daily.  2. Metoprolol 25 mg daily.   PHYSICAL EXAMINATION:  General:  Well-appearing African American male.  Vital Signs:  Blood pressure 134/82, pulse 80 and regular.  Chest:  Clear breath sounds, which are symmetrical bilaterally.  Cardiovascular:  Regular rate and rhythm.  Heart sounds are crisp.  There are no murmurs  appreciated at all.  Abdomen:  Soft, nontender.  Extremities:  Warm and  well perfused.  All of his surgical scars have healed nicely.   IMPRESSION:  The patient is doing quite well.   PLAN:  The patient will return to see [place] in the future as needed.  All  of his questions have been addressed.   David [name]. David [name], M.D.  Electronically Signed   CHO/MEDQ  D:  11/23/2008  T:  11/24/2008  Job:  [id number]   cc:   David [name]. David Som, MD, [facility]  David [name]. David Ren, MD,FACP,FCCP

## 2010-09-11 NOTE — Op Note (Signed)
David [name], David [name]               ACCOUNT NO.:  [id number]   MEDICAL RECORD NO.:  [id number]          PATIENT TYPE:  INP   LOCATION:  4731                         FACILITY:  [facility]   PHYSICIAN:  Charlynne Pander, D.D.S.DATE OF BIRTH:  [birth year]   DATE OF PROCEDURE:  03/16/2008  DATE OF DISCHARGE:                               OPERATIVE REPORT   PREOPERATIVE DIAGNOSES:  1. Infected endocarditis.  2. Pre-mitral valve replacement/repair dental protocol.  3. Apical periodontitis.  4. Chronic periodontitis.  5. Multiple mobile teeth.  6. Accretions.   POSTOPERATIVE DIAGNOSES:  1. Infected endocarditis.  2. Pre-mitral valve replacement/repaired dental protocol.  3. Apical periodontitis.  4. Chronic periodontitis.  5. Multiple mobile teeth.  6. Accretions.   OPERATIONS:  1. Extraction of tooth #7, #8, #17, and #19 with alveoloplasty.  2. Gross debridement of the remaining dentition.   SURGEON:  Charlynne Pander, DDS   ASSISTANT:  Zettie Pho (dental assistant).   ANESTHESIA:  Monitored anesthesia carethrough the anesthesia team--Dr.  Krista Blue, attending.   MEDICATIONS:  1. IV antibiotic therapy as per previous orders.  2. Local anesthesia with a total utilization of 2 carpules each      containing 34 mg of lidocaine with 0.018 mg of epinephrine as well      as 1 carpule containing 9 mg of bupivacaine with 0.009 mg of      epinephrine.   SPECIMENS:  There were 4 teeth that were discarded.   DRAINS:  None.   CULTURES:  None.   FLUIDS:  Lactated Ringer solution 750 mL.   COMPLICATIONS:  None.   INDICATIONS:  The patient was recently admitted and diagnosed with  infected endocarditis.  The patient was known to have mitral valve  regurgitation requiring mitral valve replacement/repair with Dr.  Tressie Stalker.  The patient was examined and treatment planned for  multiple extractions with alveoloplasty and also to remaining dentitions  indicated.  This  treatment plan was formulated to decrease the risks and  complications associated with dental infection from further affecting  the patient's systemic health as well as to prevent infection of the  anticipated mitral valve replacement/repair surgery.   OPERATIVE FINDINGS:  The patient was examined in the operating room #2.  The teeth were identified for extraction.  The patient noted to be  affected by chronic periodontitis, apical periodontitis, multiple loose  teeth, and accretions.  The aforementioned necessitated the removal of  multiple teeth with alveoloplasty and gross debridement of the remaining  dentition.   DESCRIPTION OF PROCEDURE:  The patient was brought to the main operating  room #2.  The patient was then placed in the supine position on the  operating room table.  Monitored anesthesia care was then induced via  the anesthesia team.  The patient was then prepped and draped in the  usual manner for a dental medicine procedure.  A time-out was performed  and the patient was identified and procedures verified.  The patient was  then readied for the dental medicine procedure as follows:  The oral  cavity was thoroughly examined with findings noted  above.   Local anesthesia was administered sequentially with a total utilization  of 2 carpules, each containing 34 mg of lidocaine with 0.017 mg of  epinephrine as well as 1 carpule containing 9 mg of bupivacaine with  0.009 mg of epinephrine.   The maxillary anterior quadrant was first approached.  Infiltration was  achieved utilizing lidocaine with epinephrine in the area of tooth #7  and #8.  At this point in time, an inferior alveolar nerve block was  given involving the lower left quadrant.  Further infiltration was then  achieved with lidocaine and with epinephrine.   At this point in time, tooth #7 and #8 were similarly treated with a  series of straight elevators.  Tooth #7 and #8 were then removed with  the 150  forceps without complications.  A #15 blade incision was then  made from the distal #9 extended to the mesial #6.  Surgical flap was  then carefully reflected.  Alveoloplasty was then performed utilizing  rongeurs and bone file.  The tissues were approximated and trimmed  appropriately.  The surgical site was then irrigated with copious  amounts of sterile saline.  A piece of Surgicel was then placed in the  extraction site appropriately.  The surgical site was then closed on the  mesial of #9 extend to the mesial #6 utilizing 3-0 chromic gut suture  and a continuous interrupted suture technique x1.  One individual  interrupted suture was then placed to further close the surgical site.   At this point in time, the mandibualr left quadrant was approached.  A  15-blade incision was then made from the distal #17 extended to the  distal #20.  Surgical flap was then carefully reflected.  Appropriate  amounts of buccal and interseptal bone were removed with the surgical  handpiece and bur and copious amounts of sterile saline.  The roots  associated with tooth #19 were then subluxated and removed with the 151  forceps without complications.  At this point in time, the coronal  aspect of tooth #17 was removed with a 17 forceps.  This left the  remaining roots.  The roots were then sectioned and further buccal and  interseptal bone was removed with surgical handpiece and bur and copious  amounts of sterile saline.  The roots were then elevated and eventually  removed with 151 forceps without complications.  The alveoloplasty was  then performed utilizing rongeurs and bone file.  The surgical site was  then irrigated with copious amounts of sterile saline.  Attention was  taken to remove the significant granulation tissue in the area of the  tooth #19 at this time.  The surgical site was then irrigated with  copious amounts of sterile saline.  The tissues were approximated and  returned  appropriately.  A piece of Surgicel was then placed in each  extraction site appropriately.  The surgical site was then closed from  the distal with #17 and extended the mesial of #20 utilizing 3-0 chromic  gut suture in a continuous interrupted suture technique x1.  Two  individual interrupted sutures were then placed to further close the  surgical site.   At this point in time, a time-out was approached with a KaVo sonic  scaler.  Significant accretions were removed with the scaler.  A series  of hand curettes were then utilized to remove further accretions.  The  KaVo sonic scaler was again used to refine removal of  accretions.  At  this point in time, the entire mouth was irrigated with copious amounts  of sterile saline.  The patient was examined for complications, seen  none.  Dental medicine procedure was deemed to be complete.  A series of  4 x 4 gauze were placed in the mouth with hemostasis in the extraction  site areas.  The patient then handed to the anesthesia team for final  disposition.  After appropriate amount of time, the patient was then  taken to the postanesthesia care unit with stable vital signs with good  oxygenation level.  All counts are correct at the end of the dental  medicine procedure.  The patient will be followed and should be  acceptable for mitral valve surgery early next week as per further  review by Dr. Tressie Stalker.  Lovenox therapy could be reinstituted no  earlier than 2230 this evening and ideally would benefit from  discontinuation until early morning of March 17, 2008.  This will be  at the disposition of the Cardiology Team, however.  The patient will be  continued on IV antibiotic therapy as indicated.      Charlynne Pander, D.D.S.  Electronically Signed     RFK/MEDQ  D:  03/16/2008  T:  03/17/2008  Job:  [id number]   cc:   Madolyn Frieze. [name], MD, [facility]  Salvatore Decent. Cornelius Moras, M.D.

## 2010-09-11 NOTE — H&P (Signed)
NAMEGOKU, [name]               ACCOUNT NO.:  [id number]   MEDICAL RECORD NO.:  [id number]          PATIENT TYPE:  INP   LOCATION:  4731                         FACILITY:  [facility]   PHYSICIAN:  Madolyn Frieze. [name], MD, FACCDATE OF BIRTH:  05/12/1960   DATE OF ADMISSION:  03/11/2008  DATE OF DISCHARGE:                              HISTORY & PHYSICAL   [name] is a 50 year old male with a past medical history of subacute  bacterial endocarditis who we are admitting for the same reason.  The  patient's cardiac history dates back to July 2001.  At that time, he was  diagnosed with enterococcal endocarditis.  He was treated with  antibiotics and improved.  Followup cultures were negative.  He did have  a transesophageal echocardiogram at the time that showed moderate to  severe mitral regurgitation, and we have been following this.  His last  echocardiogram was performed on June 03, 2007.  At that time, his LV  function was normal.  There was mild left ventricular enlargement, but  his end-systolic dimension was 33 mm.  His aortic valve was normal.  His  mitral valve was moderately thickened, and there was mild prolapse of  the posterior leaflet.  There was mitral valve old vegetation that had  been documented previously and was unchanged.  There was mild mitral  regurgitation.  There was mild left atrial enlargement.  He has done  well symptomatically.  He does not have dyspnea on exertion, orthopnea,  PND, pedal edema, palpitations, presyncope, syncope, or exertional chest  pain.  However, approximately 4 weeks ago, he was lifting a box at work  and developed lower back pain.  The pain worsened, and he ultimately  required an MRI.  This was performed on February 11, 2008.  There was a  shallow broad-based disk protrusion at L4-L5 without neural impingement  or stenosis.  There was moderate disk degeneration and endplate spurring  at L5-S1.  He has been in physical therapy of this and  this has slowly  improved.  However over the past 2 weeks, he complains of fevers that  were subjective initially.  He was also having shaking chills.  He was  seen by Dr. Alwyn Ren and began taking his temperature, and 2 nights ago,  he had a temperature of 102.  He has not had a productive cough or  rhinorrhea.  He has also not had hemoptysis, nausea, vomiting, or  diarrhea and there is no dysuria or hematuria.  He does not have any  risk factors for HIV.  Because of the above, we were asked to further  evaluate.  Note, he had a chest x-ray that showed no infiltrates.  He  also had laboratories on March 08, 2008.  His white blood cell count  was 7.8, but he was anemic with a hemoglobin of 10.7 and hematocrit of  32.5.  His platelet count was 261.  He had a sed rate on February 11, 2008, of 27.  Note, he has also not had any recent dental work.  He is  on no medications at  present other than Tylenol p.r.n.   He has an allergy to [facility].   SOCIAL HISTORY:  He does not smoke nor does he consume alcohol.  There  is no history of drug use.   His family history is negative for coronary artery disease.   PAST MEDICAL HISTORY:  There is no diabetes mellitus, hypertension, or  hyperlipidemia.  He has a history of enterococcal endocarditis as  described in HPI with moderate MR.  The patient had a chest tube of  placed on the right for a collapsed lung in the 80s.   REVIEW OF SYSTEMS:  There is no headaches, but there is fevers and  chills as described in HPI.  There is no productive cough or hemoptysis.  There is no dysphagia, odynophagia, melena, or hematochezia.  There is  no dysuria or hematuria.  There are no rashes or seizure activity.  There is no orthopnea, PND, or pedal edema.  There is no peripheral  stigmata of SBE.  Remaining systems are negative other than low back  pain that she is improving as described in the HPI.   PHYSICAL EXAMINATION:  VITAL SIGNS:  His temperature was  98.7.  His  pulse is 99.  His blood pressure is 117/75.  He weighs 146 pounds.  GENERAL:  He is well developed, well nourished, in no acute distress.  SKIN:  Warm and dry.  EXTREMITIES:  There is no peripheral clubbing.  There is a 2+ femoral  pulse bilaterally.  No bruits.  Extremities showed no edema that I can  palpate.  No cords.  There are 2+ dorsalis pedis pulses bilaterally.  BACK:  Normal.  HEENT:  Normal with normal eyelids.  NECK:  Supple with normal upstroke bilaterally.  No bruits noted.  There  is no jugular distention.  I cannot appreciate thyromegaly.  CHEST:  Clear to auscultation.  No expansion.  CARDIOVASCULAR:  Regular rhythm.  There is a 3/6 systolic murmur at the  apex, radiates to left axilla.  ABDOMEN:  Nontender, nondistended.  Positive bowel sounds.  No  hepatosplenomegaly.  No mass appreciated.  There is no abdominal bruit.  NEUROLOGIC:  Grossly intact.   Note, I did not see any evidence of peripheral stigmata of SBE.   His electrocardiogram shows a sinus rhythm at a rate of 98.  The axis is  normal.  A prior septal infarct cannot be excluded.  There are no ST  changes noted.   DIAGNOSES:  1. Possible subacute bacterial endocarditis - [name]'s presentation      is concerning for the possibility of recurrent endocarditis.  He is      having fevers and chills with no localizing signs or symptoms of      infection.  He has also been recently diagnosed to be anemic which      would be consistent.  We will plan to admit to telemetry.  I will      have blood cultures checked x4 as well as a sed rate, rheumatoid      factor, urinalysis, and CBC.  After his blood cultures have been      drawn, we will begin treating him empirically with vancomycin and      gentamicin to cover for subacute bacterial endocarditis.  We will      plan to repeat his echocardiogram, and he will be arranged to have      a transesophageal echocardiogram on Monday.  Note, these may not  be  revealing as he has had a history of a mobile density in his mitral      valve previously, which is most likely still present.  We will plan      to repeat his electrocardiogram tomorrow morning to check is PR      interval.  I will also ask Infectious Disease to see the patient.  2. Anemia - we will recheck his CBC, and this may be related to      infection, although not documented at present.      Madolyn Frieze [name], MD, [facility]  Electronically Signed     BSC/MEDQ  D:  03/11/2008  T:  03/12/2008  Job:  [id number]

## 2010-09-11 NOTE — Assessment & Plan Note (Signed)
[facility]                            CARDIOLOGY OFFICE NOTE   NAME:Bastien, JANE BROUGHTON                      MRN:          [id number]  DATE:04/07/2008                            DOB:          12-Sep-1960    Gaynor returns for followup today.  He is a 50 year old gentleman who  has a history of endocarditis.  He recently presents to the office with  complaints of fevers, and we admitted him to the hospital, and he was  diagnosed with recurrent endocarditis.  Note:  His blood cultures grew  Streptococcus viridans.  His workup revealed that he had some oral  infections and teeth were pulled.  He continued on antibiotics.  The  patient underwent surgery on March 22, 2008.  He was treated with a  mitral valve repair via a miniature thoracotomy.  There was core matrix  patch reconstruction of the anterior leaflet with artificial Gore-Tex  cord replacement with plication of the flail posterior mitral valve  leaflet.  Also of note, the patient did have a catheterization prior to  his procedure on March 15, 2008.  He was found to have no significant  coronary disease.  His postoperative course was complicated by recurrent  admission for diarrhea and intractable nausea and vomiting.  He was  treated for C. diff, but the toxin was negative.  However, he did  improve.  He now denies any dyspnea on exertion, orthopnea, PND, pedal  edema, palpitations, presyncope, syncope, or exertional chest pain.  He  is slowly recovering from his surgery.  Note:  He has had no fevers.   His medications include:  1. Coumadin as directed.  2. Metoprolol ER 25 mg p.o. daily.  3. Rocephin.   PHYSICAL EXAMINATION:  VITAL SIGNS:  Blood pressure 106/72 and his pulse  is 107.  He weighs 145 pounds.  HEENT:  Normal.  NECK:  Supple.  CHEST:  Clear.  CARDIOVASCULAR:  Regular rate and rhythm.  There is a 1-2/6 diastolic  murmur at the apex, but there is no systolic murmur noted.   His previous  incision site is without evidence of infection.  ABDOMEN:  No tenderness.  EXTREMITIES:  No edema.   DIAGNOSIS:  Status post mitral valve repair secondary to severe mitral  regurgitation and Streptococcus viridans endocarditis - Mr. Ottley is  making a good recovery.  He will complete his antibiotics on April 09, 2008.  Two weeks after he has completed his antibiotics, we will  plan to repeat his blood cultures to make sure that his blood remains  sterilized.  Note:  He did have a followup echocardiogram performed on  April 01, 2008, following his mitral valve repair.  His ejection  fraction was 45%.  There was thickening of the mitral valve and mild  mitral stenosis, but there was no significant mitral regurgitation.  He  will continue on his Coumadin for now.  Dr. Cornelius Moras plans to discontinue  this in approximately 6 weeks when he sees him back in  the [facility] office.  Note:  He will continue with subacute bacterial  endocarditis  prophylaxis.  We will see him back in approximately 3  months.     Madolyn Frieze [name], MD, [facility]  Electronically Signed    BSC/MedQ  DD: 04/07/2008  DT: 04/08/2008  Job #: [id number]

## 2010-09-14 NOTE — H&P (Signed)
[facility]  Patient:    David [name], David [name]                        MRN: [id number] Adm. Date:  10/23/99 Attending:  Titus Dubin. Alwyn Ren, M.D. [facility]                         History and Physical  DATE OF BIRTH:  [birth year].  SOCIAL SECURITY NUMBER:  [id number].  HISTORY OF PRESENT ILLNESS:  The patient is a 50 year old Afro-American who came to the office to establish as a new patient with acute complaints of fever and sweats.  He has had intermittent fever for approximately a month. He was treated at an urgent care with an unknown antibiotic for 10 days.  Since that time, he has had drenching night sweats; upon retiring, he has noted chilling and rigor.  He feels hot but has not been actually measuring his temperature.  He has had no rash or tick exposure.  His weight has been stable and he denies any GI symptomatology.  He has had an intermittent nagging discomfort in the left flank.  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  FAMILY HISTORY:  Hypertension and stroke in a paternal grandmother.  SOCIAL HISTORY:  He does not drink; he quit smoking in 1989.  He has a strong spiritual faith, professing to be a [place].  His exercise has been erratic recently.  He works at [employer].  REVIEW OF SYSTEMS:  The review of systems is totally negative with the exception of those items outlined above.  PHYSICAL EXAMINATION:  GENERAL:  He is thin and well-muscled.  He has alopecia totalis.  He appears in no acute distress and appears at least a decade younger than his 38 years. Weight is 136 pounds.  VITAL SIGNS:  Pulse 88 and regular, respirations are 20 and blood pressure 106/66.  Temperature is 100.6.  HEENT:  Fundal examination is unremarkable.  No conjunctival hemorrhages are noted.  Otolaryngologic exam was negative.  Dental hygiene is excellent.  NECK:  Thyroid is normal to palpation.  No  bruits were noted in the neck.  CHEST:  Clear with no increased work of breathing.  CARDIAC:  He had a grade 2 to 2-1/2 systolic murmur, loudest at the apex, with extension to the midaxillary line.  ABDOMEN:  Unremarkable with no organomegaly or masses; particularly, there is no splenomegaly.  LYMPHATICS:  He has no lymphadenopathy about the head, neck or axillae.  RECTAL:  The prostate is slightly tender to palpation but no nodules are present.  GU:  He has minor intrascrotal granulomatous changes.  No active disease is suggested.  SKIN:  There is an operative scar over the right axillary line of the thorax. He has a scar over his nose.  The nail beds are clear with no splinter hemorrhages.  ASSESSMENT:  He is admitted for full cultures with empiric antibiotics with nafcillin, ampicillin and gentamicin, pending 2-D echocardiogram and cardiac and infectious disease consultations. DD:  10/23/99 TD:  10/24/99 Job: [id number]

## 2010-09-14 NOTE — Consult Note (Signed)
[facility]  Patient:    David [name], David [name]                      MRN: [id number] Proc. Date: 10/29/99 Adm. Date:  [id number] Attending:  Dolores Patty CC:         Pricilla Riffle. Trisha Mangle, M.D. [facility]                          Consultation Report  REASON FOR CONSULTATION:  History of urinary tract infection.  HISTORY:  The patient is a 50 year old male who has a history of enterococcal endocarditis.  He was seen at Urgent Care about two months ago, with right flank pain.  He was treated with a 10-day course of Levaquin.  For about a month he had been having night sweats and fever.  He was worked up and found to have enterococcal endocarditis.  The patient also gave a history of urinary tract infection x 2 several years ago.  He currently denies frequency, hematuria, urgency, hesitancy, or dysuria.  SOCIAL HISTORY:  He does not drink.  He quit smoking about 12 years ago.  PHYSICAL EXAMINATION:  ABDOMEN:  He has no CVA tenderness.  His kidneys are not palpable.  His bladder is not distended.  GENITALIA:  His penis is uncircumcised.  His meatus is normal.  Scrotum is unremarkable.  Cord, testicle, and epididymis are all normal.  RECTAL:  Sphincter tone is normal.  His prostate is small, smooth, flat, and nontender.  LABORATORY:  Urine culture is negative.  BUN 6, creatinine 1.0.  IMPRESSION:  Endocarditis, question pyelonephritis.  Because of his history of urinary tract infection, we will obtain an IVP to evaluate his upper tracts.  The patient may need a cystoscopy to complete the evaluation if the IVP is normal.  IVP can be done as an outpatient if the patient is discharged soon. DD:  10/29/99 TD:  10/30/99 Job: [id number]

## 2010-09-14 NOTE — Discharge Summary (Signed)
[facility]  Patient:    David [name], David [name]                      MRN: [id number] Adm. Date:  [id number] Disc. Date: [id number] Attending:  Dolores [name] CC:         David [name]. David [name], M.D. [facility]             David [name]. David [name], M.D. [facility]                           Discharge Summary  HISTORY OF PRESENT ILLNESS:  [name] is a 50 -year-old, black male who presented to the office with complaints of fever and sweats.  He had intermittent fevers for about a month.  He had been seen in the urgent care center, was treated for pyelonephritis with Levaquin for seven days.  Since the treatment he continued to have drenching night sweats, chills and rigors. He was seen by Dr. Marga [name] in the office and placed in the hospital for further evaluation.  Please see the dictated history and physical for further details.  HOSPITAL COURSE:  Cardiovascular.  The patient was found to have a new systolic murmur by examination.  A 2D echo revealed a vegetation of the mitral valve.  There was moderate to severe mitral regurgitation.  Also felt to probably have a flail posterior leaflet.  The patient also underwent TEE which also revealed moderate to severe mitral regurgitation with vegetation.  The patients blood cultures were positive for enterococcus.  These were positive in 3 out of 4 cultures.  The patient was initially started on nafcillin, ampicillin and gentamicin.  Nafcillin was discontinued when the identification was final.  Repeat blood cultures on October 26, 1999, were negative.  The patient has been on ampicillin and gentamicin without appreciable side effects.  His creatinine has remained within this normal range.  He did undergo audiogram on July 3 and found to have normal hearing.  The patient was advised of possible hearing problems as well as kidney problems due to the gentamicin therapy.  The patient will be discharged home to complete a 28-day course of  ampicillin and gentamicin.  His last day of therapy should be November 22, 1999.  Further evaluation while in the hospital revealed a urologic evaluation with IVP which was negative.  He was seen by Dr. Brunilda [name].  He also underwent an abdominopelvic CT scan on November 01, 1999 which was also negative for source of bacteremia.  The patient will be discharged home with close follow-up.  He will continue on his ampicillin and gentamicin through [facility].  FINAL DIAGNOSIS:  Enterococcal endocarditis of the mitral valve.  DISCHARGE MEDICATIONS:  Ampicillin 2 g IV q.4h., gentamicin 60 mg q.8h. Needs to continue through November 22, 1999.  The patient should also have PICC line site care through [facility].  FOLLOW-UP:  With me in the office in November 13, 1999 at 8:15 a.m.  The patient will also have weekly creatinines drawn through [facility].  Follow-up will also be with Dr. Olga [name] in four weeks.  Further follow-up with Dr. Marga [name]. DD:  11/02/99 TD:  11/02/99 Job: [id number]

## 2010-11-14 ENCOUNTER — Encounter: Payer: Self-pay | Admitting: Internal Medicine

## 2011-01-17 LAB — CK TOTAL AND CKMB (NOT AT ARMC)
CK, MB: 2.9
Relative Index: 1.4
Total CK: 211

## 2011-01-17 LAB — TROPONIN I: Troponin I: 0.01

## 2011-01-28 LAB — URINALYSIS, ROUTINE W REFLEX MICROSCOPIC
Bilirubin Urine: NEGATIVE
Glucose, UA: NEGATIVE
Hgb urine dipstick: NEGATIVE
Ketones, ur: 15 — AB
Leukocytes, UA: NEGATIVE
Nitrite: NEGATIVE
Protein, ur: 30 — AB
Specific Gravity, Urine: 1.03
Urobilinogen, UA: 1
pH: 6

## 2011-01-28 LAB — COMPREHENSIVE METABOLIC PANEL
ALT: 14
AST: 15
Albumin: 2.8 — ABNORMAL LOW
Alkaline Phosphatase: 53
BUN: 13
CO2: 26
Calcium: 8.7
Chloride: 106
Creatinine, Ser: 1.2
GFR calc Af Amer: >60
GFR calc non Af Amer: >60
Glucose, Bld: 119 — ABNORMAL HIGH
Potassium: 3.5
Sodium: 137
Total Bilirubin: 0.6
Total Protein: 6

## 2011-01-28 LAB — DIFFERENTIAL
Basophils Absolute: 0
Basophils Relative: 0
Eosinophils Absolute: 0
Eosinophils Relative: 0
Lymphocytes Relative: 9 — ABNORMAL LOW
Lymphs Abs: 0.7
Monocytes Absolute: 0.2
Monocytes Relative: 3
Neutro Abs: 6.6
Neutrophils Relative %: 88 — ABNORMAL HIGH

## 2011-01-28 LAB — CBC
HCT: 31.9 — ABNORMAL LOW
Hemoglobin: 10.4 — ABNORMAL LOW
MCHC: 32.6
MCV: 88.1
Platelets: 363
RBC: 3.62 — ABNORMAL LOW
RDW: 14
WBC: 7.6

## 2011-01-28 LAB — URINE CULTURE: Colony Count: 25000

## 2011-01-28 LAB — SEDIMENTATION RATE: Sed Rate: 27 — ABNORMAL HIGH

## 2011-01-28 LAB — URINE MICROSCOPIC-ADD ON

## 2011-01-29 LAB — BASIC METABOLIC PANEL
BUN: 10
BUN: 4 — ABNORMAL LOW
BUN: 6
BUN: 6
BUN: 8
BUN: 8
BUN: 9
CO2: 22
CO2: 26
CO2: 26
CO2: 26
CO2: 27
CO2: 28
CO2: 28
Calcium: 7.9 — ABNORMAL LOW
Calcium: 8.3 — ABNORMAL LOW
Calcium: 8.4
Calcium: 8.4
Calcium: 8.5
Calcium: 8.7
Calcium: 9.1
Chloride: 102
Chloride: 102
Chloride: 103
Chloride: 103
Chloride: 104
Chloride: 107
Chloride: 110
Creatinine, Ser: 0.92
Creatinine, Ser: 0.98
Creatinine, Ser: 0.99
Creatinine, Ser: 1.01
Creatinine, Ser: 1.06
Creatinine, Ser: 1.08
Creatinine, Ser: 1.21
GFR calc Af Amer: >60
GFR calc Af Amer: >60
GFR calc Af Amer: >60
GFR calc Af Amer: >60
GFR calc Af Amer: >60
GFR calc Af Amer: >60
GFR calc Af Amer: >60
GFR calc non Af Amer: >60
GFR calc non Af Amer: >60
GFR calc non Af Amer: >60
GFR calc non Af Amer: >60
GFR calc non Af Amer: >60
GFR calc non Af Amer: >60
GFR calc non Af Amer: >60
Glucose, Bld: 107 — ABNORMAL HIGH
Glucose, Bld: 111 — ABNORMAL HIGH
Glucose, Bld: 113 — ABNORMAL HIGH
Glucose, Bld: 91
Glucose, Bld: 97
Glucose, Bld: 97
Glucose, Bld: 98
Potassium: 3.4 — ABNORMAL LOW
Potassium: 3.6
Potassium: 3.7
Potassium: 3.8
Potassium: 3.8
Potassium: 3.8
Potassium: 4.3
Sodium: 133 — ABNORMAL LOW
Sodium: 135
Sodium: 136
Sodium: 136
Sodium: 137
Sodium: 137
Sodium: 139

## 2011-01-29 LAB — TYPE AND SCREEN
ABO/RH(D): O POS
ABO/RH(D): O POS
Antibody Screen: NEGATIVE
Antibody Screen: NEGATIVE

## 2011-01-29 LAB — CBC
HCT: 26.9 — ABNORMAL LOW
HCT: 27 — ABNORMAL LOW
HCT: 27.4 — ABNORMAL LOW
HCT: 27.5 — ABNORMAL LOW
HCT: 27.9 — ABNORMAL LOW
HCT: 28.2 — ABNORMAL LOW
HCT: 28.7 — ABNORMAL LOW
HCT: 28.7 — ABNORMAL LOW
HCT: 29.6 — ABNORMAL LOW
HCT: 30.7 — ABNORMAL LOW
HCT: 31.6 — ABNORMAL LOW
Hemoglobin: 10 — ABNORMAL LOW
Hemoglobin: 10.6 — ABNORMAL LOW
Hemoglobin: 8.8 — ABNORMAL LOW
Hemoglobin: 9.1 — ABNORMAL LOW
Hemoglobin: 9.2 — ABNORMAL LOW
Hemoglobin: 9.4 — ABNORMAL LOW
Hemoglobin: 9.4 — ABNORMAL LOW
Hemoglobin: 9.4 — ABNORMAL LOW
Hemoglobin: 9.4 — ABNORMAL LOW
Hemoglobin: 9.5 — ABNORMAL LOW
Hemoglobin: 9.7 — ABNORMAL LOW
MCHC: 32.6
MCHC: 32.6
MCHC: 32.7
MCHC: 32.9
MCHC: 32.9
MCHC: 33.4
MCHC: 33.4
MCHC: 33.5
MCHC: 33.7
MCHC: 34
MCHC: 34.3
MCV: 83.1
MCV: 84.5
MCV: 84.6
MCV: 84.7
MCV: 84.9
MCV: 85.5
MCV: 85.6
MCV: 85.7
MCV: 86
MCV: 86.1
MCV: 86.3
Platelets: 144 — ABNORMAL LOW
Platelets: 163
Platelets: 166
Platelets: 172
Platelets: 183
Platelets: 224
Platelets: 241
Platelets: 257
Platelets: 280
Platelets: 298
Platelets: 388
RBC: 3.13 — ABNORMAL LOW
RBC: 3.17 — ABNORMAL LOW
RBC: 3.19 — ABNORMAL LOW
RBC: 3.2 — ABNORMAL LOW
RBC: 3.24 — ABNORMAL LOW
RBC: 3.38 — ABNORMAL LOW
RBC: 3.39 — ABNORMAL LOW
RBC: 3.4 — ABNORMAL LOW
RBC: 3.46 — ABNORMAL LOW
RBC: 3.64 — ABNORMAL LOW
RBC: 3.69 — ABNORMAL LOW
RDW: 15.6 — ABNORMAL HIGH
RDW: 15.8 — ABNORMAL HIGH
RDW: 15.8 — ABNORMAL HIGH
RDW: 15.8 — ABNORMAL HIGH
RDW: 15.9 — ABNORMAL HIGH
RDW: 16 — ABNORMAL HIGH
RDW: 16.4 — ABNORMAL HIGH
RDW: 16.4 — ABNORMAL HIGH
RDW: 16.5 — ABNORMAL HIGH
RDW: 17.3 — ABNORMAL HIGH
RDW: 18.1 — ABNORMAL HIGH
WBC: 10.9 — ABNORMAL HIGH
WBC: 11.2 — ABNORMAL HIGH
WBC: 12.6 — ABNORMAL HIGH
WBC: 13.2 — ABNORMAL HIGH
WBC: 4.9
WBC: 5
WBC: 5.4
WBC: 5.5
WBC: 7
WBC: 8.8
WBC: 9.1

## 2011-01-29 LAB — URINE MICROSCOPIC-ADD ON

## 2011-01-29 LAB — POCT I-STAT 4, (NA,K, GLUC, HGB,HCT)
Glucose, Bld: 111 — ABNORMAL HIGH
Glucose, Bld: 122 — ABNORMAL HIGH
Glucose, Bld: 132 — ABNORMAL HIGH
Glucose, Bld: 136 — ABNORMAL HIGH
Glucose, Bld: 73
Glucose, Bld: 95
Glucose, Bld: 98
HCT: 20 — ABNORMAL LOW
HCT: 22 — ABNORMAL LOW
HCT: 25 — ABNORMAL LOW
HCT: 26 — ABNORMAL LOW
HCT: 29 — ABNORMAL LOW
HCT: 31 — ABNORMAL LOW
HCT: 35 — ABNORMAL LOW
Hemoglobin: 10.5 — ABNORMAL LOW
Hemoglobin: 11.9 — ABNORMAL LOW
Hemoglobin: 6.8 — CL
Hemoglobin: 7.5 — CL
Hemoglobin: 8.5 — ABNORMAL LOW
Hemoglobin: 8.8 — ABNORMAL LOW
Hemoglobin: 9.9 — ABNORMAL LOW
Potassium: 3.8
Potassium: 3.9
Potassium: 4
Potassium: 4
Potassium: 4
Potassium: 4.7
Potassium: 5.5 — ABNORMAL HIGH
Sodium: 132 — ABNORMAL LOW
Sodium: 135
Sodium: 136
Sodium: 137
Sodium: 138
Sodium: 141
Sodium: 142

## 2011-01-29 LAB — POCT I-STAT 3, ART BLOOD GAS (G3+)
Acid-Base Excess: 1
Acid-base deficit: 1
Acid-base deficit: 3 — ABNORMAL HIGH
Acid-base deficit: 4 — ABNORMAL HIGH
Acid-base deficit: 4 — ABNORMAL HIGH
Bicarbonate: 21.8
Bicarbonate: 22.4
Bicarbonate: 22.6
Bicarbonate: 23
Bicarbonate: 23.6
Bicarbonate: 27.3 — ABNORMAL HIGH
O2 Saturation: 100
O2 Saturation: 100
O2 Saturation: 100
O2 Saturation: 94
O2 Saturation: 98
O2 Saturation: 98
Patient temperature: 34.3
Patient temperature: 37.1
TCO2: 23
TCO2: 24
TCO2: 24
TCO2: 24
TCO2: 25
TCO2: 29
pCO2 arterial: 33.7 — ABNORMAL LOW
pCO2 arterial: 35.5
pCO2 arterial: 39.4
pCO2 arterial: 42
pCO2 arterial: 44
pCO2 arterial: 49.7 — ABNORMAL HIGH
pH, Arterial: 7.319 — ABNORMAL LOW
pH, Arterial: 7.324 — ABNORMAL LOW
pH, Arterial: 7.348 — ABNORMAL LOW
pH, Arterial: 7.349 — ABNORMAL LOW
pH, Arterial: 7.42
pH, Arterial: 7.453 — ABNORMAL HIGH
pO2, Arterial: 104 — ABNORMAL HIGH
pO2, Arterial: 279 — ABNORMAL HIGH
pO2, Arterial: 401 — ABNORMAL HIGH
pO2, Arterial: 493 — ABNORMAL HIGH
pO2, Arterial: 63 — ABNORMAL LOW
pO2, Arterial: 93

## 2011-01-29 LAB — CULTURE, BLOOD (ROUTINE X 2)
Culture: NO GROWTH
Culture: NO GROWTH

## 2011-01-29 LAB — ABO/RH: ABO/RH(D): O POS

## 2011-01-29 LAB — SEDIMENTATION RATE: Sed Rate: 44 — ABNORMAL HIGH

## 2011-01-29 LAB — URINALYSIS, ROUTINE W REFLEX MICROSCOPIC
Bilirubin Urine: NEGATIVE
Bilirubin Urine: NEGATIVE
Glucose, UA: NEGATIVE
Glucose, UA: NEGATIVE
Hgb urine dipstick: NEGATIVE
Hgb urine dipstick: NEGATIVE
Ketones, ur: NEGATIVE
Ketones, ur: NEGATIVE
Leukocytes, UA: NEGATIVE
Nitrite: NEGATIVE
Nitrite: NEGATIVE
Protein, ur: 30 — AB
Protein, ur: NEGATIVE
Specific Gravity, Urine: 1.007
Specific Gravity, Urine: 1.034 — ABNORMAL HIGH
Urobilinogen, UA: 0.2
Urobilinogen, UA: 1
pH: 6
pH: 7

## 2011-01-29 LAB — POCT I-STAT GLUCOSE
Glucose, Bld: 138 — ABNORMAL HIGH
Operator id: 3342

## 2011-01-29 LAB — BLOOD GAS, ARTERIAL
Acid-base deficit: 0.9
Bicarbonate: 22.7
Drawn by: 270211
FIO2: 0.21
O2 Saturation: 97.9
Patient temperature: 98.1
TCO2: 23.7
pCO2 arterial: 33.5 — ABNORMAL LOW
pH, Arterial: 7.444
pO2, Arterial: 97

## 2011-01-29 LAB — POCT I-STAT, CHEM 8
BUN: 8
Calcium, Ion: 1.18
Chloride: 107
Creatinine, Ser: 1
Glucose, Bld: 120 — ABNORMAL HIGH
HCT: 26 — ABNORMAL LOW
Hemoglobin: 8.8 — ABNORMAL LOW
Potassium: 4.6
Sodium: 141
TCO2: 21

## 2011-01-29 LAB — MAGNESIUM
Magnesium: 2.4
Magnesium: 2.9 — ABNORMAL HIGH

## 2011-01-29 LAB — POCT I-STAT 3, VENOUS BLOOD GAS (G3P V)
Acid-Base Excess: 1
Acid-base deficit: 1
Acid-base deficit: 1
Bicarbonate: 23.8
Bicarbonate: 24.6 — ABNORMAL HIGH
Bicarbonate: 25.6 — ABNORMAL HIGH
O2 Saturation: 71
O2 Saturation: 77
O2 Saturation: 86
TCO2: 25
TCO2: 26
TCO2: 27
pCO2, Ven: 35.9 — ABNORMAL LOW
pCO2, Ven: 39.2 — ABNORMAL LOW
pCO2, Ven: 50.4 — ABNORMAL HIGH
pH, Ven: 7.313 — ABNORMAL HIGH
pH, Ven: 7.393 — ABNORMAL HIGH
pH, Ven: 7.443 — ABNORMAL HIGH
pO2, Ven: 38
pO2, Ven: 40
pO2, Ven: 57 — ABNORMAL HIGH

## 2011-01-29 LAB — PROTIME-INR
INR: 1.4
INR: 1.5
INR: 1.6 — ABNORMAL HIGH
INR: 1.7 — ABNORMAL HIGH
INR: 1.7 — ABNORMAL HIGH
INR: 2 — ABNORMAL HIGH
Prothrombin Time: 17.4 — ABNORMAL HIGH
Prothrombin Time: 18.8 — ABNORMAL HIGH
Prothrombin Time: 19.4 — ABNORMAL HIGH
Prothrombin Time: 20.8 — ABNORMAL HIGH
Prothrombin Time: 21 — ABNORMAL HIGH
Prothrombin Time: 23.9 — ABNORMAL HIGH

## 2011-01-29 LAB — URINE CULTURE
Colony Count: NO GROWTH
Culture: NO GROWTH
Special Requests: NEGATIVE

## 2011-01-29 LAB — COMPREHENSIVE METABOLIC PANEL
ALT: 18
ALT: 42
AST: 16
AST: 31
Albumin: 2.8 — ABNORMAL LOW
Albumin: 3.5
Alkaline Phosphatase: 61
Alkaline Phosphatase: 73
BUN: 10
BUN: 12
CO2: 24
CO2: 28
Calcium: 9
Calcium: 9.7
Chloride: 102
Chloride: 103
Creatinine, Ser: 1.18
Creatinine, Ser: 1.23
GFR calc Af Amer: >60
GFR calc Af Amer: >60
GFR calc non Af Amer: >60
GFR calc non Af Amer: >60
Glucose, Bld: 116 — ABNORMAL HIGH
Glucose, Bld: 98
Potassium: 3.4 — ABNORMAL LOW
Potassium: 3.9
Sodium: 136
Sodium: 138
Total Bilirubin: 0.4
Total Bilirubin: <0.1 — ABNORMAL LOW
Total Protein: 6.7
Total Protein: 7.8

## 2011-01-29 LAB — DIFFERENTIAL
Basophils Absolute: 0
Basophils Relative: 0
Eosinophils Absolute: 0
Eosinophils Relative: 0
Lymphocytes Relative: 24
Lymphs Abs: 1.6
Monocytes Absolute: 0.6
Monocytes Relative: 8
Neutro Abs: 4.8
Neutrophils Relative %: 68

## 2011-01-29 LAB — HEPATIC FUNCTION PANEL
ALT: 14
AST: 10
Albumin: 2.4 — ABNORMAL LOW
Alkaline Phosphatase: 49
Bilirubin, Direct: <0.1
Total Bilirubin: 0.5
Total Protein: 5.7 — ABNORMAL LOW

## 2011-01-29 LAB — HIV ANTIBODY (ROUTINE TESTING W REFLEX): HIV: NONREACTIVE

## 2011-01-29 LAB — ANAEROBIC CULTURE

## 2011-01-29 LAB — APTT
aPTT: 31
aPTT: 32
aPTT: 38 — ABNORMAL HIGH

## 2011-01-29 LAB — PREPARE FRESH FROZEN PLASMA

## 2011-01-29 LAB — TISSUE CULTURE: Culture: NO GROWTH

## 2011-01-29 LAB — IRON AND TIBC
Iron: 20 — ABNORMAL LOW
Saturation Ratios: 10 — ABNORMAL LOW
TIBC: 202 — ABNORMAL LOW
UIBC: 182

## 2011-01-29 LAB — GLUCOSE, CAPILLARY
Glucose-Capillary: 100 — ABNORMAL HIGH
Glucose-Capillary: 106 — ABNORMAL HIGH
Glucose-Capillary: 111 — ABNORMAL HIGH
Glucose-Capillary: 117 — ABNORMAL HIGH
Glucose-Capillary: 129 — ABNORMAL HIGH
Glucose-Capillary: 51 — ABNORMAL LOW
Glucose-Capillary: 89

## 2011-01-29 LAB — VANCOMYCIN, TROUGH: Vancomycin Tr: <5 — ABNORMAL LOW

## 2011-01-29 LAB — HEMOGLOBIN AND HEMATOCRIT, BLOOD
HCT: 21.3 — ABNORMAL LOW
Hemoglobin: 7 — CL

## 2011-01-29 LAB — PLATELET COUNT: Platelets: 199

## 2011-01-29 LAB — GRAM STAIN

## 2011-01-29 LAB — CREATININE, SERUM
Creatinine, Ser: 0.95
GFR calc Af Amer: >60
GFR calc non Af Amer: >60

## 2011-01-29 LAB — GENTAMICIN LEVEL, TROUGH: Gentamicin Trough: 0.6

## 2011-01-29 LAB — TSH: TSH: 0.968

## 2011-01-29 LAB — OCCULT BLOOD X 1 CARD TO LAB, STOOL: Fecal Occult Bld: NEGATIVE

## 2011-01-31 LAB — URINALYSIS, ROUTINE W REFLEX MICROSCOPIC
Bilirubin Urine: NEGATIVE
Glucose, UA: NEGATIVE mg/dL
Hgb urine dipstick: NEGATIVE
Ketones, ur: NEGATIVE mg/dL
Leukocytes, UA: NEGATIVE
Nitrite: NEGATIVE
Protein, ur: 100 mg/dL — AB
Specific Gravity, Urine: 1.036 — ABNORMAL HIGH (ref 1.005–1.030)
Urobilinogen, UA: 0.2 mg/dL (ref 0.0–1.0)
pH: 5.5 (ref 5.0–8.0)

## 2011-01-31 LAB — CARDIAC PANEL(CRET KIN+CKTOT+MB+TROPI)
CK, MB: 1.6 ng/mL (ref 0.3–4.0)
Relative Index: INVALID (ref 0.0–2.5)
Total CK: 63 U/L (ref 7–232)
Troponin I: 0.07 ng/mL — ABNORMAL HIGH (ref 0.00–0.06)

## 2011-01-31 LAB — POCT I-STAT, CHEM 8
BUN: 14 mg/dL (ref 6–23)
Calcium, Ion: 1.22 mmol/L (ref 1.12–1.32)
Chloride: 104 mEq/L (ref 96–112)
Creatinine, Ser: 1.2 mg/dL (ref 0.4–1.5)
Glucose, Bld: 121 mg/dL — ABNORMAL HIGH (ref 70–99)
HCT: 39 % (ref 39.0–52.0)
Hemoglobin: 13.3 g/dL (ref 13.0–17.0)
Potassium: 3.5 mEq/L (ref 3.5–5.1)
Sodium: 139 mEq/L (ref 135–145)
TCO2: 23 mmol/L (ref 0–100)

## 2011-01-31 LAB — COMPREHENSIVE METABOLIC PANEL
ALT: 52 U/L (ref 0–53)
AST: 21 U/L (ref 0–37)
Albumin: 2.6 g/dL — ABNORMAL LOW (ref 3.5–5.2)
Alkaline Phosphatase: 53 U/L (ref 39–117)
BUN: 6 mg/dL (ref 6–23)
CO2: 24 mEq/L (ref 19–32)
Calcium: 8.6 mg/dL (ref 8.4–10.5)
Chloride: 108 mEq/L (ref 96–112)
Creatinine, Ser: 0.87 mg/dL (ref 0.4–1.5)
GFR calc Af Amer: >60 mL/min (ref >60)
GFR calc non Af Amer: >60 mL/min (ref >60)
Glucose, Bld: 100 mg/dL — ABNORMAL HIGH (ref 70–99)
Potassium: 3.5 mEq/L (ref 3.5–5.1)
Sodium: 138 mEq/L (ref 135–145)
Total Bilirubin: 0.4 mg/dL (ref 0.3–1.2)
Total Protein: 5.6 g/dL — ABNORMAL LOW (ref 6.0–8.3)

## 2011-01-31 LAB — DIFFERENTIAL
Basophils Absolute: 0 10*3/uL (ref 0.0–0.1)
Basophils Relative: 0 % (ref 0–1)
Eosinophils Absolute: 0 10*3/uL (ref 0.0–0.7)
Eosinophils Relative: 0 % (ref 0–5)
Lymphocytes Relative: 6 % — ABNORMAL LOW (ref 12–46)
Lymphs Abs: 0.5 10*3/uL — ABNORMAL LOW (ref 0.7–4.0)
Monocytes Absolute: 0.2 10*3/uL (ref 0.1–1.0)
Monocytes Relative: 2 % — ABNORMAL LOW (ref 3–12)
Neutro Abs: 6.8 10*3/uL (ref 1.7–7.7)
Neutrophils Relative %: 91 % — ABNORMAL HIGH (ref 43–77)

## 2011-01-31 LAB — LIPID PANEL
Cholesterol: 108 mg/dL (ref 0–200)
Cholesterol: 112 mg/dL (ref 0–200)
HDL: 21 mg/dL — ABNORMAL LOW (ref >39)
HDL: 25 mg/dL — ABNORMAL LOW (ref >39)
LDL Cholesterol: 73 mg/dL (ref 0–99)
LDL Cholesterol: 74 mg/dL (ref 0–99)
Total CHOL/HDL Ratio: 4.5 RATIO
Total CHOL/HDL Ratio: 5.1 RATIO
Triglycerides: 66 mg/dL (ref <150)
Triglycerides: 70 mg/dL (ref <150)
VLDL: 13 mg/dL (ref 0–40)
VLDL: 14 mg/dL (ref 0–40)

## 2011-01-31 LAB — CBC
HCT: 28.1 % — ABNORMAL LOW (ref 39.0–52.0)
HCT: 29.4 % — ABNORMAL LOW (ref 39.0–52.0)
HCT: 36.6 % — ABNORMAL LOW (ref 39.0–52.0)
Hemoglobin: 12 g/dL — ABNORMAL LOW (ref 13.0–17.0)
Hemoglobin: 9.4 g/dL — ABNORMAL LOW (ref 13.0–17.0)
Hemoglobin: 9.9 g/dL — ABNORMAL LOW (ref 13.0–17.0)
MCHC: 32.8 g/dL (ref 30.0–36.0)
MCHC: 33.3 g/dL (ref 30.0–36.0)
MCHC: 33.7 g/dL (ref 30.0–36.0)
MCV: 85.3 fL (ref 78.0–100.0)
MCV: 86 fL (ref 78.0–100.0)
MCV: 86.1 fL (ref 78.0–100.0)
Platelets: 330 10*3/uL (ref 150–400)
Platelets: 336 10*3/uL (ref 150–400)
Platelets: 364 10*3/uL (ref 150–400)
RBC: 3.27 MIL/uL — ABNORMAL LOW (ref 4.22–5.81)
RBC: 3.45 MIL/uL — ABNORMAL LOW (ref 4.22–5.81)
RBC: 4.26 MIL/uL (ref 4.22–5.81)
RDW: 15.9 % — ABNORMAL HIGH (ref 11.5–15.5)
RDW: 16.1 % — ABNORMAL HIGH (ref 11.5–15.5)
RDW: 16.7 % — ABNORMAL HIGH (ref 11.5–15.5)
WBC: 4.9 10*3/uL (ref 4.0–10.5)
WBC: 5.2 10*3/uL (ref 4.0–10.5)
WBC: 7.5 10*3/uL (ref 4.0–10.5)

## 2011-01-31 LAB — POCT CARDIAC MARKERS
CKMB, poc: 1.2 ng/mL (ref 1.0–8.0)
Myoglobin, poc: 97.1 ng/mL (ref 12–200)
Troponin i, poc: <0.05 ng/mL (ref 0.00–0.09)

## 2011-01-31 LAB — PROTIME-INR
INR: 2.3 — ABNORMAL HIGH (ref 0.00–1.49)
INR: 2.3 — ABNORMAL HIGH (ref 0.00–1.49)
INR: 2.4 — ABNORMAL HIGH (ref 0.00–1.49)
Prothrombin Time: 27.2 seconds — ABNORMAL HIGH (ref 11.6–15.2)
Prothrombin Time: 27.2 seconds — ABNORMAL HIGH (ref 11.6–15.2)
Prothrombin Time: 27.7 seconds — ABNORMAL HIGH (ref 11.6–15.2)

## 2011-01-31 LAB — AMYLASE: Amylase: 89 U/L (ref 27–131)

## 2011-01-31 LAB — CLOSTRIDIUM DIFFICILE EIA
C difficile Toxins A+B, EIA: NEGATIVE
C difficile Toxins A+B, EIA: NEGATIVE
C difficile Toxins A+B, EIA: NEGATIVE

## 2011-01-31 LAB — CULTURE, BLOOD (ROUTINE X 2)
Culture: NO GROWTH
Culture: NO GROWTH

## 2011-01-31 LAB — BASIC METABOLIC PANEL
BUN: 5 mg/dL — ABNORMAL LOW (ref 6–23)
CO2: 24 mEq/L (ref 19–32)
Calcium: 8.4 mg/dL (ref 8.4–10.5)
Chloride: 107 mEq/L (ref 96–112)
Creatinine, Ser: 0.91 mg/dL (ref 0.4–1.5)
GFR calc Af Amer: >60 mL/min (ref >60)
GFR calc non Af Amer: >60 mL/min (ref >60)
Glucose, Bld: 91 mg/dL (ref 70–99)
Potassium: 3.5 mEq/L (ref 3.5–5.1)
Sodium: 136 mEq/L (ref 135–145)

## 2011-01-31 LAB — HEPATIC FUNCTION PANEL
ALT: 97 U/L — ABNORMAL HIGH (ref 0–53)
AST: 49 U/L — ABNORMAL HIGH (ref 0–37)
Albumin: 3.4 g/dL — ABNORMAL LOW (ref 3.5–5.2)
Alkaline Phosphatase: 79 U/L (ref 39–117)
Bilirubin, Direct: 0.2 mg/dL (ref 0.0–0.3)
Indirect Bilirubin: 0.6 mg/dL (ref 0.3–0.9)
Total Bilirubin: 0.8 mg/dL (ref 0.3–1.2)
Total Protein: 8.1 g/dL (ref 6.0–8.3)

## 2011-01-31 LAB — URINE MICROSCOPIC-ADD ON

## 2011-01-31 LAB — TSH
TSH: 1.474 u[IU]/mL (ref 0.350–4.500)
TSH: 2.102 u[IU]/mL (ref 0.350–4.500)

## 2011-01-31 LAB — LIPASE, BLOOD: Lipase: 29 U/L (ref 11–59)

## 2011-01-31 LAB — FECAL LACTOFERRIN, QUANT: Fecal Lactoferrin: POSITIVE

## 2011-01-31 LAB — HIV ANTIBODY (ROUTINE TESTING W REFLEX): HIV: NONREACTIVE

## 2011-01-31 LAB — APTT: aPTT: 35 seconds (ref 24–37)

## 2011-02-03 ENCOUNTER — Other Ambulatory Visit: Payer: Self-pay | Admitting: Internal Medicine

## 2011-05-14 ENCOUNTER — Ambulatory Visit (INDEPENDENT_AMBULATORY_CARE_PROVIDER_SITE_OTHER): Payer: BC Managed Care – PPO | Admitting: Family Medicine

## 2011-05-14 ENCOUNTER — Ambulatory Visit
Admission: RE | Admit: 2011-05-14 | Discharge: 2011-05-14 | Disposition: A | Discharge status: Home / Self Care | Payer: BC Managed Care – PPO | Source: Ambulatory Visit | Attending: Family Medicine | Admitting: Family Medicine

## 2011-05-14 ENCOUNTER — Encounter: Payer: Self-pay | Admitting: Family Medicine

## 2011-05-14 VITALS — BP 120/70 | HR 79 | Temp 97.6°F | Ht 69.5 in | Wt 164.6 lb

## 2011-05-14 DIAGNOSIS — R05 Cough: Secondary | ICD-10-CM

## 2011-05-14 DIAGNOSIS — J40 Bronchitis, not specified as acute or chronic: Secondary | ICD-10-CM

## 2011-05-14 DIAGNOSIS — R062 Wheezing: Secondary | ICD-10-CM

## 2011-05-14 DIAGNOSIS — J209 Acute bronchitis, unspecified: Secondary | ICD-10-CM

## 2011-05-14 DIAGNOSIS — R059 Cough, unspecified: Secondary | ICD-10-CM

## 2011-05-14 MED ORDER — AMOXICILLIN-POT CLAVULANATE 875-125 MG PO TABS: 1.0000 | ORAL_TABLET | Freq: Two times a day (BID) | ORAL | Status: AC

## 2011-05-14 MED ORDER — GUAIFENESIN-CODEINE 100-10 MG/5ML PO SYRP: 5.0000 mL | ORAL_SOLUTION | Freq: Three times a day (TID) | ORAL | Status: AC | PRN

## 2011-05-14 NOTE — Progress Notes (Signed)
  Subjective:     David [name] is a 51 y.o. male here for evaluation of a cough. Onset of symptoms was 2 weeks ago. Symptoms have been gradually worsening since that time. The cough is productive and is aggravated by exercise and reclining position. Associated symptoms include: chills, fever, shortness of breath, sputum production and wheezing. Patient does not have a history of asthma. Patient does have a history of environmental allergens. Patient has not traveled recently. Patient does have a history of smoking. Patient has not had a previous chest x-ray. Patient has not had a PPD done.  The following portions of the patient's history were reviewed and updated as appropriate: allergies, current medications, past family history, past medical history, past social history, past surgical history and problem list.  Review of Systems Pertinent items are noted in HPI.    Objective:    Oxygen saturation 99% on room air BP 120/70  Pulse 79  Temp(Src) 97.6 F (36.4 C) (Oral)  Ht 5' 9.5" (1.765 m)  Wt 164 lb 9.6 oz (74.662 kg)  BMI 23.96 kg/m2  SpO2 99% General appearance: alert, cooperative, appears stated age and no distress Ears: normal TM's and external ear canals both ears Nose: clear discharge, mild congestion, no sinus tenderness Throat: lips, mucosa, and tongue normal; teeth and gums normal Neck: moderate anterior cervical adenopathy, supple, symmetrical, trachea midline and thyroid not enlarged, symmetric, no tenderness/mass/nodules Lungs: diminished breath sounds bilaterally, rales bibasilar and wheezes RLL Heart: regular rate and rhythm, S1, S2 normal, no murmur, click, rub or gallop    Assessment:    Acute Bronchitis    Plan:    Antibiotics per medication orders. Antitussives per medication orders. Avoid exposure to tobacco smoke and fumes. Call if shortness of breath worsens, blood in sputum, change in character of cough, development of fever or chills, inability to  maintain nutrition and hydration. Avoid exposure to tobacco smoke and fumes.

## 2011-05-14 NOTE — Patient Instructions (Signed)

## 2011-07-05 ENCOUNTER — Telehealth: Payer: Self-pay | Admitting: *Deleted

## 2011-07-05 NOTE — Telephone Encounter (Signed)
[facility] Triage Call Report Triage Record Num: [id number] Operator: Baldomero Lamy Patient Name: David [name] Call Date & Time: 07/05/2011 10:14:30AM Patient Phone: [phone] PCP: Marga Melnick Patient Gender: Male PCP Fax : [phone] Patient DOB: 06-18-1960 Practice Name: [facility] Reason for Call: Caller: [name]/Patient; PCP: Marga Melnick; CB#: [phone]; ; ; Call regarding Nose Bleed; Pt calling regarding nose bleed this am that was resolved with 5-10 min of pressure but now pt c/o slight nagging h/a. Reports sinus pain and pressure lately Routinely takes Claritin. Denies any bleeding/clotting d/o or HTN. States not able to give blood last time because HGB was 12.1, 12.5 is the cut off. Afebrile. Emergent sxs of Nosebleed r/o. Disp: Home care advice given with strict call back parameters. Protocol(s) Used: Nosebleed Recommended Outcome per Protocol: Provide Home/Self Care Reason for Outcome: No symptoms presently but has concerns or request for further information Care Advice: ~ Call provider if symptoms continue, worsen, or new symptoms develop. ~ Keep the head higher than the level of the heart. Sit up or lie back a little with the head elevated. ~ SYMPTOM / CONDITION MANAGEMENT ~ CAUTIONS Post Nosebleed Care: * Avoid blowing nose for 12 hours after bleeding episode. * Do not pick nose. * Do not strain or bend down to lift anything heavy for 3 days. * Use a cool mist humidifier to moisten room air. * Do not use aspirin or nonsteroidal anti-inflammatory drugs for at least one week. * Elevate head on 3 pillows when lying down for the next 3 days. * Consider use of nonprescription nasal mucus membrane ointment or spray per label or pharmacist's instructions. ~ Nosebleed Care: - Keep person calm; agitation may increase bleeding. - Sit in an upright position and tilt head forward. - Pinch all the soft parts of the nose just below the bony  portion of the nose with the thumb and side of bent index finger. - Maintain firm pressure for a full 10 minutes by a clock. - Breathe through the mouth. - If bleeding is not stopped, apply pressure for another full 10 minutes by a clock. - Apply a cloth-covered ice pack to nose and cheeks. ~ 07/05/2011 10:31:40AM

## 2011-07-16 ENCOUNTER — Encounter: Payer: Self-pay | Admitting: Internal Medicine

## 2011-07-16 ENCOUNTER — Ambulatory Visit (INDEPENDENT_AMBULATORY_CARE_PROVIDER_SITE_OTHER): Payer: BC Managed Care – PPO | Admitting: Internal Medicine

## 2011-07-16 VITALS — BP 128/90 | HR 89 | Temp 98.5°F | Wt 160.4 lb

## 2011-07-16 DIAGNOSIS — R030 Elevated blood-pressure reading, without diagnosis of hypertension: Secondary | ICD-10-CM

## 2011-07-16 DIAGNOSIS — R040 Epistaxis: Secondary | ICD-10-CM

## 2011-07-16 MED ORDER — LEVITRA 20 MG PO TABS: ORAL_TABLET | ORAL | Status: DC

## 2011-07-16 MED ORDER — FLUTICASONE PROPIONATE 50 MCG/ACT NA SUSP: 1.0000 | Freq: Two times a day (BID) | NASAL | Status: AC | PRN

## 2011-07-16 NOTE — Progress Notes (Signed)
  Subjective:    Patient ID: David [name], male    DOB: 29-Dec-1960, 51 y.o.   MRN: [id number]  HPI He's had 2 self-limited episodes of epistaxis; initially 3/13 and again 07/16/11. He denies any symptoms of a respiratory tract infection such as purulent nasal secretions or sputum. He also denies hemoptysis, melena, rectal bleeding, hematuria, or abnormal bruising or bleeding.  As noted his diastolic blood pressure is 90; he has not been monitoring blood pressure at home.    Review of Systems  He has no past history of hypertension. He states he has not been exercising on a regular basis     Objective:   Physical Exam General appearance:thin but in good health ;well nourished; no acute distress or increased work of breathing is present.  No  lymphadenopathy about the head, neck, or axilla noted.   Eyes: No conjunctival inflammation or lid edema is present.   Ears:  External ear exam shows no significant lesions or deformities.  Otoscopic examination reveals some wax  bilaterally  Nose:  External nasal examination shows no deformity or inflammation. Nasal mucosa are dry & erythematous  without lesions or exudates. No septal dislocation or deviation.No obstruction to airflow.   Oral exam: Dental hygiene is good;upper partial. The  lips and gums are healthy appearing.There is no oropharyngeal erythema or exudate noted.   Neck:  No  tenderness noted.    Heart:  Normal rate and regular rhythm. S1 and S2 normal without gallop, murmur, click, rub or other extra sounds.   Lungs:Chest clear to auscultation; no wheezes, rhonchi,rales ,or rubs present.No increased work of breathing.    Extremities:  No cyanosis, edema, or clubbing  noted    Skin: Warm & dry           Assessment & Plan:  #1 epistaxis self-limited. The nasal mucosa is changes suggest excess drying and friability.  #2 mildly elevated diastolic blood pressure; monitor is indicated  Plan: See orders and recommendations

## 2011-07-16 NOTE — Patient Instructions (Signed)
Flonase 1 spray in each nostril twice a day as needed. Use the "crossover" technique as discussed.Use Eucerin   twice a day  for the drying.   Blood Pressure Goal  Ideally is an AVERAGE < 135/85. This AVERAGE should be calculated from @ least 5-7 BP readings taken @ different times of day on different days of week. You should not respond to isolated BP readings , but rather the AVERAGE for that week  Take Allegra 160 mg a day; this is a nonsedating antihistamine or take Zyrtec at bedtime,this tends to be sedating. Avoid decongestants as these may have adverse effects including elevation of blood pressure, palpitations, and prostatic dysfunction.

## 2011-10-22 ENCOUNTER — Ambulatory Visit (INDEPENDENT_AMBULATORY_CARE_PROVIDER_SITE_OTHER): Payer: BC Managed Care – PPO | Admitting: Internal Medicine

## 2011-10-22 ENCOUNTER — Encounter: Payer: Self-pay | Admitting: Internal Medicine

## 2011-10-22 VITALS — BP 144/82 | HR 81 | Temp 98.2°F | Wt 158.0 lb

## 2011-10-22 DIAGNOSIS — I10 Essential (primary) hypertension: Secondary | ICD-10-CM

## 2011-10-22 DIAGNOSIS — K219 Gastro-esophageal reflux disease without esophagitis: Secondary | ICD-10-CM

## 2011-10-22 MED ORDER — TADALAFIL 20 MG PO TABS: ORAL_TABLET | ORAL | Status: DC

## 2011-10-22 MED ORDER — RANITIDINE HCL 150 MG PO TABS
150.0000 mg | ORAL_TABLET | Freq: Two times a day (BID) | ORAL | Status: DC
Start: 2011-10-22 — End: 2012-09-30

## 2011-10-22 MED ORDER — RANITIDINE HCL 150 MG PO TABS: 150.0000 mg | ORAL_TABLET | Freq: Every day | ORAL | Status: DC

## 2011-10-22 NOTE — Patient Instructions (Addendum)
The triggers for reflux  include stress; the "aspirin family" ; alcohol; peppermint; and caffeine (coffee, tea, cola, and chocolate). The aspirin family would include aspirin and the nonsteroidal agents such as ibuprofen &  Naproxen. Tylenol would not cause reflux. If having symptoms ; food & drink should be avoided for @ least 2 hours before going to bed.  Blood Pressure Goal  Ideally is an AVERAGE < 135/85. This AVERAGE should be calculated from @ least 5-7 BP readings taken @ different times of day on different days of week. You should not respond to isolated BP readings , but rather the AVERAGE for that week

## 2011-10-22 NOTE — Progress Notes (Signed)
  Subjective:    Patient ID: David [name], male    DOB: Jun 25, 1960, 51 y.o.   MRN: [id number]  HPI His reflux symptoms are well controlled with as needed ranitidine. He averages taking this 2-3 times a week.  His blood pressure typically averages 120/78. He believes that coffee causes spikes up  to the 140 systolic range    Review of Systems He denies significant dyspepsia, dysphagia,abdominal pain, unexplained weight loss, melena, or rectal bleeding.  He denies significant headaches, chest pain, palpitations, edema, or claudication.  As a preference, he wishes to change from Levitra to Cialis.     Objective:   Physical Exam General appearance:thin but in good health ;well nourished; no acute distress or increased work of breathing is present.  No  lymphadenopathy about the head, neck, or axilla noted.   Eyes: No conjunctival inflammation or lid edema is present. There is no scleral icterus.  Oral exam: Dental hygiene is good; lips and gums are healthy appearing.There is no oropharyngeal erythema or exudate noted.   Neck:  No deformities, thyromegaly, masses, or tenderness noted.   Heart:  Normal rate and regular rhythm. S1 and S2 normal without gallop, murmur, click, rub or other extra sounds.   Lungs:Chest clear to auscultation; no wheezes, rhonchi,rales ,or rubs present.No increased work of breathing.    Extremities:  No cyanosis, edema, or clubbing  noted    Skin: Warm & dry .         Assessment & Plan:

## 2011-11-19 ENCOUNTER — Encounter: Payer: Self-pay | Admitting: Internal Medicine

## 2011-11-19 ENCOUNTER — Ambulatory Visit (INDEPENDENT_AMBULATORY_CARE_PROVIDER_SITE_OTHER): Payer: BC Managed Care – PPO | Admitting: Internal Medicine

## 2011-11-19 VITALS — BP 118/80 | HR 81 | Temp 98.7°F | Resp 12 | Ht 69.08 in | Wt 157.8 lb

## 2011-11-19 DIAGNOSIS — E785 Hyperlipidemia, unspecified: Secondary | ICD-10-CM

## 2011-11-19 DIAGNOSIS — Z0000 Encounter for general adult medical examination without abnormal findings: Secondary | ICD-10-CM

## 2011-11-19 DIAGNOSIS — R9431 Abnormal electrocardiogram [ECG] [EKG]: Secondary | ICD-10-CM

## 2011-11-19 LAB — CBC WITH DIFFERENTIAL/PLATELET
HCT: 37.4 % — ABNORMAL LOW (ref 39.0–52.0)
Hemoglobin: 12 g/dL — ABNORMAL LOW (ref 13.0–17.0)
MCHC: 32.1 g/dL (ref 30.0–36.0)
MCV: 86.8 fl (ref 78.0–100.0)
Platelets: 225 10*3/uL (ref 150.0–400.0)
RBC: 4.31 Mil/uL (ref 4.22–5.81)
RDW: 16.4 % — ABNORMAL HIGH (ref 11.5–14.6)
WBC: 2.4 10*3/uL — ABNORMAL LOW (ref 4.5–10.5)

## 2011-11-19 LAB — LIPID PANEL
Cholesterol: 231 mg/dL — ABNORMAL HIGH (ref 0–200)
HDL: 79.6 mg/dL (ref >39.00)
Total CHOL/HDL Ratio: 3
Triglycerides: 63 mg/dL (ref 0.0–149.0)
VLDL: 12.6 mg/dL (ref 0.0–40.0)

## 2011-11-19 LAB — BASIC METABOLIC PANEL
BUN: 9 mg/dL (ref 6–23)
CO2: 27 mEq/L (ref 19–32)
Calcium: 9.4 mg/dL (ref 8.4–10.5)
Chloride: 101 mEq/L (ref 96–112)
Creatinine, Ser: 0.9 mg/dL (ref 0.4–1.5)
GFR: 113.97 mL/min (ref >60.00)
Glucose, Bld: 74 mg/dL (ref 70–99)
Potassium: 3.6 mEq/L (ref 3.5–5.1)
Sodium: 137 mEq/L (ref 135–145)

## 2011-11-19 LAB — HEPATIC FUNCTION PANEL
ALT: 20 U/L (ref 0–53)
AST: 29 U/L (ref 0–37)
Albumin: 4.5 g/dL (ref 3.5–5.2)
Alkaline Phosphatase: 57 U/L (ref 39–117)
Bilirubin, Direct: 0 mg/dL (ref 0.0–0.3)
Total Bilirubin: 0.7 mg/dL (ref 0.3–1.2)
Total Protein: 8 g/dL (ref 6.0–8.3)

## 2011-11-19 LAB — TSH: TSH: 0.67 u[IU]/mL (ref 0.35–5.50)

## 2011-11-19 LAB — LDL CHOLESTEROL, DIRECT: Direct LDL: 132.4 mg/dL

## 2011-11-19 LAB — PSA: PSA: 2.51 ng/mL (ref 0.10–4.00)

## 2011-11-19 MED ORDER — LORATADINE 10 MG PO TABS: 10.0000 mg | ORAL_TABLET | Freq: Every day | ORAL | Status: DC

## 2011-11-19 NOTE — Patient Instructions (Addendum)
Preventive Health Care: Exercise at least 30-45 minutes a day,  3-4 days a week.  Eat a low-fat diet with lots of fruits and vegetables, up to 7-9 servings per day. Health Care Power of Attorney & Living Will. Complete if not in place ; these place you in charge of your health care decisions. Take the EKG to any emergency room or preop visits. There are nonspecific changes; as long as there is no new change these are not clinically significa Please try to go on My Chart within the next 24 hours to allow me to release the results directly to you.

## 2011-11-19 NOTE — Progress Notes (Signed)
  Subjective:    Patient ID: David [name], male    DOB: April 21, 1961, 51 y.o.   MRN: [id number]  HPI  David [name]  is here for a physical; he denies acute issues.      Review of Systems  Past medical history/family history/social history were all reviewed and updated.  Patient reports no  vision/ hearing changes,anorexia, weight change, fever ,adenopathy, persistant / recurrent hoarseness, swallowing issues, chest pain,palpitations, edema,persistant / recurrent cough, hemoptysis, dyspnea(rest, exertional, paroxysmal nocturnal), gastrointestinal  bleeding (melena, rectal bleeding), abdominal pain, excessive heart burn, GU symptoms( dysuria, hematuria, pyuria, voiding/incontinence  issues) syncope, focal weakness, memory loss,numbness & tingling, skin/hair/nail changes,depression, anxiety, abnormal bruising/bleeding, or musculoskeletal symptoms/signs.     Objective:   Physical Exam Gen.: Thin but healthy and well-nourished in appearance. Alert, appropriate and cooperative throughout exam. Head: Normocephalic without obvious abnormalities  Eyes: No corneal or conjunctival inflammation noted. Pupils equal round reactive to light and accommodation. Fundal exam is benign without hemorrhages, exudate, papilledema. Extraocular motion intact. Vision grossly normal with lenses.Arcus Ears: External  ear exam reveals no significant lesions or deformities. Some wax bilaterally but hearing is grossly normal bilaterally. Nose: External nasal exam reveals no deformity or inflammation. Nasal mucosa are pink and moist. No lesions or exudates noted.  Mouth: Oral mucosa and oropharynx reveal no lesions or exudates. Lower teeth in good repair.Upper partial Neck: No deformities, masses, or tenderness noted. Range of motion & Thyroid normal. Prominent hyoid bone. Lungs: Normal respiratory effort; chest expands symmetrically. Lungs are clear to auscultation without rales, wheezes, or increased work of breathing. Heart:  Normal rate and rhythm. Normal S1 and S2. No gallop, click, or rub. No murmur. Abdomen: Bowel sounds normal; abdomen soft and nontender. No masses, organomegaly or hernias noted. Genitalia/ GMW:NUUV granulomas bilaterally in the epididymal area. Small varices on the left. Prostate asymmetric; right lobe larger than left. No definite nodules or induration presentMusculoskeletal/extremities: No deformity or scoliosis noted of  the thoracic or lumbar spine. No clubbing, cyanosis, edema, or deformity noted. Range of motion  normal .Tone & strength  normal.Joints normal. Nail health  good. Vascular: Carotid, radial artery, dorsalis pedis and  posterior tibial pulses are full and equal. No bruits present. Neurologic: Alert and oriented x3. Deep tendon reflexes symmetrical and normal.          Skin: Intact without suspicious lesions or rashes. Lymph: No cervical, axillary, or inguinal lymphadenopathy present. Psych: Mood and affect are normal. Normally interactive                                                                                         Assessment & Plan:  #1 comprehensive physical exam; no acute findings #2 see Problem List with Assessments & Recommendations Plan: see Orders

## 2012-03-24 ENCOUNTER — Ambulatory Visit (INDEPENDENT_AMBULATORY_CARE_PROVIDER_SITE_OTHER): Payer: BC Managed Care – PPO | Admitting: Internal Medicine

## 2012-03-24 ENCOUNTER — Encounter: Payer: Self-pay | Admitting: Internal Medicine

## 2012-03-24 ENCOUNTER — Encounter: Payer: Self-pay | Admitting: Gastroenterology

## 2012-03-24 VITALS — BP 126/84 | HR 80 | Temp 98.1°F | Wt 165.4 lb

## 2012-03-24 DIAGNOSIS — D649 Anemia, unspecified: Secondary | ICD-10-CM

## 2012-03-24 LAB — CBC WITH DIFFERENTIAL/PLATELET
Basophils Absolute: 0 10*3/uL (ref 0.0–0.1)
Basophils Relative: 0.9 % (ref 0.0–3.0)
Eosinophils Absolute: 0 10*3/uL (ref 0.0–0.7)
Eosinophils Relative: 1.8 % (ref 0.0–5.0)
HCT: 39.6 % (ref 39.0–52.0)
Hemoglobin: 12.5 g/dL — ABNORMAL LOW (ref 13.0–17.0)
Lymphocytes Relative: 56.9 % — ABNORMAL HIGH (ref 12.0–46.0)
Lymphs Abs: 1.3 10*3/uL (ref 0.7–4.0)
MCHC: 31.6 g/dL (ref 30.0–36.0)
MCV: 90.6 fl (ref 78.0–100.0)
Monocytes Absolute: 0.3 10*3/uL (ref 0.1–1.0)
Monocytes Relative: 13.7 % — ABNORMAL HIGH (ref 3.0–12.0)
Neutro Abs: 0.6 10*3/uL — ABNORMAL LOW (ref 1.4–7.7)
Neutrophils Relative %: 26.7 % — ABNORMAL LOW (ref 43.0–77.0)
Platelets: 214 10*3/uL (ref 150.0–400.0)
RBC: 4.37 Mil/uL (ref 4.22–5.81)
RDW: 16.5 % — ABNORMAL HIGH (ref 11.5–14.6)
WBC: 2.3 10*3/uL — ABNORMAL LOW (ref 4.5–10.5)

## 2012-03-24 LAB — IBC PANEL
Iron: 88 ug/dL (ref 42–165)
Saturation Ratios: 22 % (ref 20.0–50.0)
Transferrin: 285.1 mg/dL (ref 212.0–360.0)

## 2012-03-24 LAB — VITAMIN B12: Vitamin B-12: 455 pg/mL (ref 211–911)

## 2012-03-24 NOTE — Assessment & Plan Note (Signed)
CBC and differential will be rechecked with iron panel and B12 levels. His insurance company has recommended surveillance colonoscopy; his last study was negative in 2005. This would be  reasonable.

## 2012-03-24 NOTE — Progress Notes (Signed)
  Subjective:    Patient ID: RODERT KENNELL, male    DOB: 05/23/1960, 51 y.o.   MRN: [id number]  HPI His hemoglobin was found to be 12.2 on 03/20/12 when he went to donate blood at the [facility]. His past history includes rectal bleeding in 2005; colonoscopy was negative. He's never had an upper endoscopy  He has a history of reflux and hiatal hernia. There's no other GI history; the family history is negative for ulcers, colitis, colon polyps, or colon cancer.    Review of Systems He denies epistaxis, hemoptysis, hematuria, melena, or rectal bleeding.He has no unexplained weight loss, dysphagia, or abdominal pain. He has no abnormal bruising or bleeding. He has no difficulty stopping bleeding with injury.       Objective:   Physical ExamGeneral appearance:in good health; well nourished; w/o distress.  Eyes: No conjunctival inflammation or scleral icterus is present.  Oral exam: Dental hygiene is good; lips and gums are healthy appearing.There is no oropharyngeal erythema or exudate noted.   Heart:  Normal rate and regular rhythm. Accentuated S1 ; normal S2 normal without gallop, murmur, click, rub or other extra sounds     Lungs:Chest clear to auscultation; no wheezes, rhonchi,rales ,or rubs present.No increased work of breathing.   Abdomen: bowel sounds normal, soft and non-tender without masses, organomegaly or hernias noted.  No guarding or rebound   Skin:Warm & dry.  Intact without suspicious lesions or rashes ; no jaundice or tenting  Lymphatic: No lymphadenopathy is noted about the head, neck, axilla              Assessment & Plan:

## 2012-03-24 NOTE — Patient Instructions (Addendum)
Please do not donate blood until the etiology of the anemia can be verified. Please complete & return stool cards

## 2012-04-02 ENCOUNTER — Encounter: Payer: Self-pay | Admitting: Internal Medicine

## 2012-04-03 ENCOUNTER — Other Ambulatory Visit (INDEPENDENT_AMBULATORY_CARE_PROVIDER_SITE_OTHER): Payer: BC Managed Care – PPO

## 2012-04-03 DIAGNOSIS — Z1289 Encounter for screening for malignant neoplasm of other sites: Secondary | ICD-10-CM

## 2012-04-03 LAB — POC HEMOCCULT BLD/STL (OFFICE/1-CARD/DIAGNOSTIC): Fecal Occult Blood, POC: NEGATIVE

## 2012-04-09 ENCOUNTER — Encounter: Payer: Self-pay | Admitting: Gastroenterology

## 2012-04-09 ENCOUNTER — Ambulatory Visit (INDEPENDENT_AMBULATORY_CARE_PROVIDER_SITE_OTHER): Payer: BC Managed Care – PPO | Admitting: Gastroenterology

## 2012-04-09 ENCOUNTER — Telehealth: Payer: Self-pay | Admitting: Cardiology

## 2012-04-09 ENCOUNTER — Telehealth: Payer: Self-pay | Admitting: *Deleted

## 2012-04-09 VITALS — BP 122/82 | HR 85 | Ht 69.0 in | Wt 163.4 lb

## 2012-04-09 DIAGNOSIS — Z1211 Encounter for screening for malignant neoplasm of colon: Secondary | ICD-10-CM

## 2012-04-09 MED ORDER — MOVIPREP 100 G PO SOLR: 1.0000 | Freq: Once | ORAL | Status: DC

## 2012-04-09 NOTE — Progress Notes (Signed)
History of Present Illness:  This is a very pleasant 51 year old African American male status post mitral valve repair after endocarditis in 2009.  He currently is asymptomatic in terms of any general medical, cardiac, or gastrointestinal columns.  His bowels move every day and he denies melena or hematochezia.  Review of his labs shows a mild anemia with a hemoglobin of 12 but normal iron saturation, and negative occult blood testing in his stool.  Last colonoscopy exam was unremarkable in October 2005.  Does have occasional postprandial heartburn relieved by over-the-counter Zantac.   I have reviewed this patient's present history, medical and surgical past history, allergies and medications.     ROS: The remainder of the 10 point ROS is negative     Physical Exam: Blood pressure 122/82, pulse 85 and regular, and weight 163 pounds with a BMI of 24.13.  98% oxygen saturation with room-air. General well developed well nourished patient in no acute distress, appearing their stated age Eyes PERRLA, no icterus, fundoscopic exam per opthamologist Skin no lesions noted Neck supple, no adenopathy, no thyroid enlargement, no tenderness Chest clear to percussion and auscultation Heart no significant murmurs, gallops or rubs noted... grade 2/6 holosystolic murmur at the left cardiac border noted. Abdomen no hepatosplenomegaly masses or tenderness, BS normal.  Extremities no acute joint lesions, edema, phlebitis or evidence of cellulitis. Neurologic patient oriented x 3, cranial nerves intact, no focal neurologic deficits noted. Psychological mental status normal and normal affect.  Assessment and plan: Patient notified by [facility] of need for colonoscopy exam per his age.  We have scheduled this at his convenience.  He does take oral antibiotics before procedures per his mitral valve disease.  I do not think this patient needs endoscopic exam at this point.  Risk and benefits of  colonoscopy, colonoscopy prep, and possible complications were reviewed with patient.  No diagnosis found.

## 2012-04-09 NOTE — Telephone Encounter (Signed)
Pt states is on antibiotics prior to a procedure due to his MVP and they want to discuss this with you and make sure he is taking it right and we are aware

## 2012-04-09 NOTE — Telephone Encounter (Signed)
I called [facility] because patient see's Dr. [name] and asked to speak to his nurse or CMA regarding antibiotics that patient takes before procedures for his mitral valve disease.  Patient states he knows what to do but I would like to get advise on this to make sure we are doing correctly.  Waiting on nurse to call back

## 2012-04-09 NOTE — Telephone Encounter (Signed)
Spoke with kelly, questions regarding SBE and up coming colonoscopy answered.

## 2012-04-09 NOTE — Patient Instructions (Addendum)
You have been scheduled for a colonoscopy with propofol. Please follow written instructions given to you at your visit today.  Please pick up your prep kit at the pharmacy within the next 1-3 days. If you use inhalers (even only as needed) or a CPAP machine, please bring them with you on the day of your procedure.   Per patient he does take oral antibiotics before procedures per his mitral valve disease. Patient has antibiotics at home and patient states he knows how to take.

## 2012-04-09 NOTE — Telephone Encounter (Signed)
Debra from Dr. Ludwig Clarks office called back and stated that patient knows to take antibiotics before procedures and no further actions need to be taken by [place] or them.

## 2012-04-13 ENCOUNTER — Encounter: Payer: Self-pay | Admitting: Gastroenterology

## 2012-04-13 ENCOUNTER — Ambulatory Visit (AMBULATORY_SURGERY_CENTER): Payer: BC Managed Care – PPO | Admitting: Gastroenterology

## 2012-04-13 VITALS — BP 103/79 | HR 72 | Temp 98.3°F | Resp 15 | Ht 69.0 in | Wt 163.0 lb

## 2012-04-13 DIAGNOSIS — Z1211 Encounter for screening for malignant neoplasm of colon: Secondary | ICD-10-CM

## 2012-04-13 MED ORDER — SODIUM CHLORIDE 0.9 % IV SOLN: 500.0000 mL | INTRAVENOUS | Status: DC

## 2012-04-13 NOTE — Progress Notes (Signed)
Pt. States that he took Amoxicillin at 12:30 per advice of his cardiologist-Dr. [name].  He has hx of heart valve repair and endocarditis. Took with sips of H20 , estimates less that .

## 2012-04-13 NOTE — Op Note (Signed)
[facility] [address] [place], [address]   COLONOSCOPY PROCEDURE REPORT  PATIENT: David [name], David [name]  MR#: [id number] BIRTHDATE: Aug 22, 1960 , 51  yrs. old GENDER: Male ENDOSCOPIST: Mardella Layman, MD, [facility] REFERRED BY: PROCEDURE DATE:  04/13/2012 PROCEDURE:   Colonoscopy, screening ASA CLASS:   Class II INDICATIONS:Average risk patient for colon cancer. MEDICATIONS: propofol (Diprivan) 350mg  IV  DESCRIPTION OF PROCEDURE:   After the risks and benefits and of the procedure were explained, informed consent was obtained.  A digital rectal exam revealed no abnormalities of the rectum.    The LB [id number]  endoscope was introduced through the anus and advanced to the cecum, which was identified by both the appendix and ileocecal valve .  The quality of the prep was excellent, using MoviPrep .  The instrument was then slowly withdrawn as the colon was fully examined.     COLON FINDINGS: A normal appearing cecum, ileocecal valve, and appendiceal orifice were identified.  The ascending, hepatic flexure, transverse, splenic flexure, descending, sigmoid colon and rectum appeared unremarkable.  No polyps or cancers were seen. Retroflexed views revealed no abnormalities.     The scope was then withdrawn from the patient and the procedure completed.  COMPLICATIONS: There were no complications. ENDOSCOPIC IMPRESSION: Normal colon,no polyps or cancer noted...  RECOMMENDATIONS: Continue current colorectal screening recommendations for "routine risk" patients with a repeat colonoscopy in 10 years.   REPEAT EXAM:  cc:Pecola Lawless, MD  _______________________________ eSigned:  Mardella Layman, MD, [facility] 04/13/2012 2:33 PM

## 2012-04-13 NOTE — Progress Notes (Addendum)
Patient did not have preoperative order for IV antibiotic SSI prophylaxis. (G8918)  Patient did not experience any of the following events: a burn prior to discharge; a fall within the facility; wrong site/side/patient/procedure/implant event; or a hospital transfer or hospital admission upon discharge from the facility. (G8907)  

## 2012-04-13 NOTE — Patient Instructions (Addendum)
YOU HAD AN ENDOSCOPIC PROCEDURE TODAY AT THE [facility]: Refer to the procedure report that was given to you for any specific questions about what was found during the examination.  If the procedure report does not answer your questions, please call your gastroenterologist to clarify.  If you requested that your care partner not be given the details of your procedure findings, then the procedure report has been included in a sealed envelope for you to review at your convenience later.  YOU SHOULD EXPECT: Some feelings of bloating in the abdomen. Passage of more gas than usual.  Walking can help get rid of the air that was put into your GI tract during the procedure and reduce the bloating. If you had a lower endoscopy (such as a colonoscopy or flexible sigmoidoscopy) you may notice spotting of blood in your stool or on the toilet paper. If you underwent a bowel prep for your procedure, then you may not have a normal bowel movement for a few days.  DIET: Your first meal following the procedure should be a light meal and then it is ok to progress to your normal diet.  A half-sandwich or bowl of soup is an example of a good first meal.  Heavy or fried foods are harder to digest and may make you feel nauseous or bloated.  Likewise meals heavy in dairy and vegetables can cause extra gas to form and this can also increase the bloating.  Drink plenty of fluids but you should avoid alcoholic beverages for 24 hours.  ACTIVITY: Your care partner should take you home directly after the procedure.  You should plan to take it easy, moving slowly for the rest of the day.  You can resume normal activity the day after the procedure however you should NOT DRIVE or use heavy machinery for 24 hours (because of the sedation medicines used during the test).    SYMPTOMS TO REPORT IMMEDIATELY: A gastroenterologist can be reached at any hour.  During normal business hours, 8:30 AM to 5:00 PM Monday through Friday,  call [phone].  After hours and on weekends, please call the GI answering service at [phone] who will take a message and have the physician on call contact you.   Following lower endoscopy (colonoscopy or flexible sigmoidoscopy):  Excessive amounts of blood in the stool  Significant tenderness or worsening of abdominal pains  Swelling of the abdomen that is new, acute  Fever of 100F or higher  FOLLOW UP: If any biopsies were taken you will be contacted by phone or by letter within the next 1-3 weeks.  Call your gastroenterologist if you have not heard about the biopsies in 3 weeks.  Our staff will call the home number listed on your records the next business day following your procedure to check on you and address any questions or concerns that you may have at that time regarding the information given to you following your procedure. This is a courtesy call and so if there is no answer at the home number and we have not heard from you through the emergency physician on call, we will assume that you have returned to your regular daily activities without incident.  SIGNATURES/CONFIDENTIALITY: You and/or your care partner have signed paperwork which will be entered into your electronic medical record.  These signatures attest to the fact that that the information above on your After Visit Summary has been reviewed and is understood.  Full responsibility of the confidentiality of this   discharge information lies with you and/or your care-partner.   Thank-you for choosing us for your healthcare needs. 

## 2012-04-14 ENCOUNTER — Telehealth: Payer: Self-pay | Admitting: *Deleted

## 2012-04-14 NOTE — Telephone Encounter (Signed)
No answer. Phone number identifier. Message left to call if questions or concerns.  

## 2012-09-05 ENCOUNTER — Other Ambulatory Visit: Payer: Self-pay | Admitting: Internal Medicine

## 2012-09-30 ENCOUNTER — Encounter: Payer: Self-pay | Admitting: Internal Medicine

## 2012-09-30 ENCOUNTER — Ambulatory Visit (INDEPENDENT_AMBULATORY_CARE_PROVIDER_SITE_OTHER): Payer: BC Managed Care – PPO | Admitting: Internal Medicine

## 2012-09-30 VITALS — BP 130/82 | HR 85 | Temp 98.3°F | Wt 167.0 lb

## 2012-09-30 DIAGNOSIS — K219 Gastro-esophageal reflux disease without esophagitis: Secondary | ICD-10-CM

## 2012-09-30 DIAGNOSIS — H1010 Acute atopic conjunctivitis, unspecified eye: Secondary | ICD-10-CM

## 2012-09-30 DIAGNOSIS — H1013 Acute atopic conjunctivitis, bilateral: Secondary | ICD-10-CM

## 2012-09-30 MED ORDER — RANITIDINE HCL 150 MG PO TABS: 150.0000 mg | ORAL_TABLET | Freq: Two times a day (BID) | ORAL | Status: DC

## 2012-09-30 MED ORDER — LORATADINE 10 MG PO TABS: 10.0000 mg | ORAL_TABLET | Freq: Every day | ORAL | Status: DC

## 2012-09-30 MED ORDER — TADALAFIL 20 MG PO TABS: ORAL_TABLET | ORAL | Status: DC

## 2012-09-30 NOTE — Progress Notes (Signed)
  Subjective:    Patient ID: David [name], male    DOB: 1960/07/12, 52 y.o.   MRN: [id number]  HPI  Symptoms began 09/27/12 as head congestion which progressed over the next several days. This was not preceded by extrinsic symptoms of itchy, watery eyes , or sneezing. He also denied wheezing or shortness of breath.  He took Claritin with improvement in the head congestion. He did note increased wax from his ears with Q-tips.   Review of Systems He denies frontal headache, facial pain, nasal purulence, dental pain, sore throat, otic pain, or otic discharge. He also has had no cough or sputum production. There's been no associated fever, chills, or sweats.  Ranitidine controls reflux. He denies significant dyspepsia, dysphagia, abdominal pain, unexplained weight loss, melena, or rectal bleeding.     Objective:   Physical Exam General appearance:good health ;well nourished; no acute distress or increased work of breathing is present.  No  lymphadenopathy about the head, neck, or axilla noted.   Eyes: No conjunctival inflammation or lid edema is present.   Ears:  External ear exam shows no significant lesions or deformities.  Otoscopic examination reveals impactions bilaterally Nose:  External nasal examination shows no deformity or inflammation. Nasal mucosa are pink and moist without lesions or exudates. No septal dislocation or deviation.No obstruction to airflow.   Oral exam: Dental hygiene is good;upper  Partial. Lips and gums are healthy appearing.There is no oropharyngeal erythema or exudate noted.   Neck:  No deformities,  masses, or tenderness noted.      Heart:  Normal rate and regular rhythm.Accentuated S1 ; S2 normal without gallop, murmur, click, rub or other extra  sounds.   Lungs:Chest clear to auscultation; no wheezes, rhonchi,rales ,or rubs present.No increased work of breathing.    Extremities:  No cyanosis, edema, or clubbing  noted    Skin: Warm & dry          Assessment & Plan:  #1 rhinoconjunctivitis with no signs or symptoms of rhinosinusitis  #2 bilateral cerumen impactions  #3 GERD  Plan: See orders & recommendations

## 2012-09-30 NOTE — Patient Instructions (Addendum)
Plain Mucinex (NOT D) for thick secretions ;force NON dairy fluids .   Nasal cleansing in the shower as discussed with lather of mild shampoo.After 10 seconds wash off lather while  exhaling through nostrils. Make sure that all residual soap is removed to prevent irritation. Fluticasone 1 spray in each nostril twice a day as needed. Use the "crossover" technique into opposite nostril spraying toward opposite ear @ 45 degree angle, not straight up into nostril. Use a Neti pot daily only  as needed for significant sinus congestion; going from open side to congested side .Plain Allegra (NOT D )  160 daily , Loratidine 10 mg , OR Zyrtec 10 mg @ bedtime  as needed for itchy eyes & sneezing.   Please do not use Q-tips as we discussed. Should wax build up occur, please put 2-3 drops of mineral oil in the affected  ear at night to soften the wax .Cover the canal with a  cotton ball to prevent the oil from staining bed linens. In the morning fill the ear canal with hydrogen peroxide & lie in the opposite lateral decubitus position(on the side opposite the affected ear)  for 10-15 minutes. After allowing this period of time for the peroxide to dissolve the wax ;shower and use the thinnest washrag available to wick out the wax. If both ears are involved ; alternate this treatment from ear to ear each night until no wax is found on the washrag. Reflux of gastric acid may be asymptomatic as this may occur mainly during sleep.The triggers for reflux  include stress; the "aspirin family" ; alcohol; peppermint; and caffeine (coffee, tea, cola, and chocolate). The aspirin family would include aspirin and the nonsteroidal agents such as ibuprofen &  Naproxen. Tylenol would not cause reflux. If having symptoms ; food & drink should be avoided for @ least 2 hours before going to bed.

## 2012-12-03 ENCOUNTER — Ambulatory Visit: Payer: BC Managed Care – PPO | Admitting: Internal Medicine

## 2013-03-04 ENCOUNTER — Other Ambulatory Visit: Payer: Self-pay

## 2013-09-09 ENCOUNTER — Ambulatory Visit (INDEPENDENT_AMBULATORY_CARE_PROVIDER_SITE_OTHER): Payer: BC Managed Care – PPO | Admitting: Internal Medicine

## 2013-09-09 ENCOUNTER — Encounter: Payer: Self-pay | Admitting: Internal Medicine

## 2013-09-09 VITALS — BP 132/78 | HR 85 | Temp 98.3°F | Wt 164.0 lb

## 2013-09-09 DIAGNOSIS — H6123 Impacted cerumen, bilateral: Secondary | ICD-10-CM

## 2013-09-09 DIAGNOSIS — H9201 Otalgia, right ear: Secondary | ICD-10-CM

## 2013-09-09 DIAGNOSIS — H9209 Otalgia, unspecified ear: Secondary | ICD-10-CM

## 2013-09-09 DIAGNOSIS — H6120 Impacted cerumen, unspecified ear: Secondary | ICD-10-CM

## 2013-09-09 NOTE — Patient Instructions (Signed)
Please do not use Q-tips as we discussed. Should wax build up occur, please put 2-3 drops of mineral oil in the affected  ear at night to soften the wax .Cover the canal with a  cotton ball to prevent the oil from staining bed linens. In the morning fill the ear canal with hydrogen peroxide & lie in the opposite lateral decubitus position(on the side opposite the affected ear)  for 10-15 minutes. After allowing this period of time for the peroxide to dissolve the wax ;shower and use the thinnest washrag available to wick out the wax. If both ears are involved ; alternate this treatment from ear to ear each night until no wax is found on the washrag.

## 2013-09-09 NOTE — Progress Notes (Signed)
Pre visit review using our clinic review tool, if applicable. No additional management support is needed unless otherwise documented below in the visit note. 

## 2013-09-09 NOTE — Progress Notes (Signed)
   Subjective:    Patient ID: David [name], male    DOB: 1960/12/03, 53 y.o.   MRN: [id number]  HPI   Symptoms began 09/05/13 as sharp pain/pressure in the right ear after he sneezed. He documented wax buildup in the ear & attempted to clean it out with a Q-tip.  There was some improvement but he's had intermittent pain when he sneezes or blows his nose.  He denies associated tinnitus or hearing loss.   Review of Systems  He specifically denies frontal headache, facial pain, nasal purulence, dental pain, sore throat, otic discharge, cough, sputum production  He has no fever, chills, sweats.       Objective:   Physical Exam General appearance:good health ;well nourished; no acute distress or increased work of breathing is present.  No  lymphadenopathy about the head, neck, or axilla noted.   Eyes: No conjunctival inflammation or lid edema is present. Ears:  External ear exam shows no significant lesions or deformities.  Otoscopic examination reveals bilateral wax impactions Nose:  External nasal examination shows no deformity or inflammation. Nasal mucosa are pink and moist without lesions or exudates. No septal dislocation or deviation.No obstruction to airflow.   Oral exam: Dental hygiene is good; lips and gums are healthy appearing.There is no oropharyngeal erythema or exudate noted.   Neck:  No deformities, thyromegaly, masses, or tenderness noted.   Supple with full range of motion without pain.   Heart:  Normal rate and regular rhythm. S1 and S2 normal without gallop, murmur, click, rub or other extra sounds.   Lungs:Chest clear to auscultation; no wheezes, rhonchi,rales ,or rubs present.No increased work of breathing.    Extremities:  No cyanosis, edema, or clubbing  noted    Skin: Warm & dry.         Assessment & Plan:  #1 otalgia related to #2  #2 wax impactions, right greater than left  Plan: Soak & gavage  for removal

## 2013-11-03 ENCOUNTER — Other Ambulatory Visit: Payer: Self-pay

## 2013-11-03 MED ORDER — TADALAFIL 20 MG PO TABS: ORAL_TABLET | ORAL | Status: DC

## 2013-11-03 NOTE — Telephone Encounter (Signed)
OK 

## 2014-02-08 ENCOUNTER — Ambulatory Visit (INDEPENDENT_AMBULATORY_CARE_PROVIDER_SITE_OTHER): Payer: BC Managed Care – PPO | Admitting: Internal Medicine

## 2014-02-08 ENCOUNTER — Other Ambulatory Visit: Payer: BC Managed Care – PPO

## 2014-02-08 ENCOUNTER — Encounter: Payer: Self-pay | Admitting: Internal Medicine

## 2014-02-08 VITALS — BP 124/88 | HR 76 | Temp 98.4°F | Resp 14 | Wt 164.5 lb

## 2014-02-08 DIAGNOSIS — R8290 Unspecified abnormal findings in urine: Secondary | ICD-10-CM

## 2014-02-08 DIAGNOSIS — R8299 Other abnormal findings in urine: Secondary | ICD-10-CM

## 2014-02-08 DIAGNOSIS — R82998 Other abnormal findings in urine: Secondary | ICD-10-CM

## 2014-02-08 DIAGNOSIS — R319 Hematuria, unspecified: Secondary | ICD-10-CM

## 2014-02-08 LAB — POCT URINALYSIS DIPSTICK
Bilirubin, UA: NEGATIVE
Glucose, UA: NEGATIVE
Ketones, UA: NEGATIVE
Leukocytes, UA: NEGATIVE
Nitrite, UA: NEGATIVE
Spec Grav, UA: 1.015
Urobilinogen, UA: 0.2
pH, UA: 6

## 2014-02-08 MED ORDER — CIPROFLOXACIN HCL 500 MG PO TABS: 500.0000 mg | ORAL_TABLET | Freq: Two times a day (BID) | ORAL | Status: DC

## 2014-02-08 NOTE — Patient Instructions (Signed)
Drink as much nondairy fluids as possible. Avoid spicy foods or alcohol as  these may aggravate the bladder. Do not take decongestants. Avoid narcotics if possible. 

## 2014-02-08 NOTE — Progress Notes (Signed)
   Subjective:    Patient ID: David [name], male    DOB: [birth year], 53 y.o.   MRN: [id number]  HPI   Symptoms began 02/06/14 as cloudy & dark urine . He describes a somewhat greenish hue and " bubbling" character. He saw no hematuria. He had some associated hesitancy without frequency or dysuria.  He has had some right flank discomfort  In the late 1990s he had a significant urinary tract infection.   He has no bleeding dyscrasias.  Review of Systems   He denies pyuria, fever, chills, or sweats.  Epistaxis, hemoptysis,  melena, or rectal bleeding denied. No unexplained weight loss, significant dyspepsia,dysphagia, or abdominal pain.  There is no abnormal bruising , bleeding, or difficulty stopping bleeding with injury.       Objective:   Physical Exam General appearance is one of good health and nourishment w/o distress.  Eyes: No conjunctival inflammation or scleral icterus is present.  Oral exam: Dental hygiene is good; lips and gums are healthy appearing.There is no oropharyngeal erythema or exudate noted.   Heart:  Normal rate and regular rhythm. S1 and S2 normal without gallop, murmur, click, rub or other extra sounds     Lungs:Chest clear to auscultation; no wheezes, rhonchi,rales ,or rubs present.No increased work of breathing.   Abdomen: bowel sounds normal, soft and non-tender without masses, organomegaly or hernias noted.  No guarding or rebound . No tenderness over the flanks to percussion  Musculoskeletal: Able to lie flat and sit up without help. Negative straight leg raising bilaterally. Gait normal  Skin:Warm & dry.  Intact without suspicious lesions or rashes ; no jaundice or tenting  Lymphatic: No lymphadenopathy is noted about the head, neck, axilla              Assessment & Plan:  #1 painless hematuria  Plan:urine culture and sensitivity. Ciprofloxacin.  If culture is negative & the hematuria persists; Urology referral.

## 2014-02-08 NOTE — Progress Notes (Signed)
Pre visit review using our clinic review tool, if applicable. No additional management support is needed unless otherwise documented below in the visit note. 

## 2014-02-09 LAB — URINE CULTURE
Colony Count: NO GROWTH
Organism ID, Bacteria: NO GROWTH

## 2014-02-10 ENCOUNTER — Other Ambulatory Visit: Payer: Self-pay | Admitting: Internal Medicine

## 2014-02-10 DIAGNOSIS — R3129 Other microscopic hematuria: Secondary | ICD-10-CM

## 2014-02-14 ENCOUNTER — Telehealth: Payer: Self-pay | Admitting: Internal Medicine

## 2014-02-14 NOTE — Telephone Encounter (Signed)
[facility], [place] [address] [phone] Dr Janice Norrie  03/03/14  @ 1245pm, pt aware

## 2014-02-14 NOTE — Telephone Encounter (Signed)
Stanton Kidney will you help regarding this referral, thanks

## 2014-02-14 NOTE — Telephone Encounter (Signed)
Patient stated that no one has called him to set up an appt concerning his Urology referral, he stated that he would like to see someone else if he doesn't hear from anyone this week.  Please advise

## 2014-03-18 ENCOUNTER — Other Ambulatory Visit (INDEPENDENT_AMBULATORY_CARE_PROVIDER_SITE_OTHER): Payer: BC Managed Care – PPO

## 2014-03-18 ENCOUNTER — Encounter: Payer: Self-pay | Admitting: Internal Medicine

## 2014-03-18 ENCOUNTER — Ambulatory Visit (INDEPENDENT_AMBULATORY_CARE_PROVIDER_SITE_OTHER): Payer: BC Managed Care – PPO | Admitting: Internal Medicine

## 2014-03-18 VITALS — BP 120/60 | HR 95 | Temp 98.6°F | Wt 164.0 lb

## 2014-03-18 DIAGNOSIS — E162 Hypoglycemia, unspecified: Secondary | ICD-10-CM

## 2014-03-18 LAB — HEMOGLOBIN A1C: Hgb A1c MFr Bld: 4.3 % — ABNORMAL LOW (ref 4.6–6.5)

## 2014-03-18 NOTE — Progress Notes (Signed)
Pre visit review using our clinic review tool, if applicable. No additional management support is needed unless otherwise documented below in the visit note. 

## 2014-03-18 NOTE — Patient Instructions (Addendum)
Low glucose could indicate "reactive hypoglycemia" due to prolonged fasting or low sugars due to the pancreas excreting excess insulin in response to glucose elevations from " hyperglycemic carbs"  in diet. Eat a low-fat diet with lots of fruits and vegetables, up to 7-9 servings per day. Consume less than 40 grams of sugar per day from foods & drinks with High Fructose Corn Sugar as #1,2,3 or # 4 on label. Follow a low carb nutrition program such as [employer] [place] or [place] to prevent wide sugar swings. White carbohydrates (potatoes, rice, bread, and pasta) have a high spike of sugar and a high load of sugar. For example a  baked potato has a cup of sugar and a  french fry  2 teaspoons of sugar. Yams, wild  rice, whole grained bread &  wheat pasta have been much lower spike and load of  sugar. Portions should be the size of a deck of cards or your palm.

## 2014-03-18 NOTE — Progress Notes (Signed)
   Subjective:    Patient ID: David [name], male    DOB: April 17, 1961, 53 y.o.   MRN: [id number]  HPI He underwent a Life Screen study 03/17/14 at 1:11 PM.  Total cholesterol was 182, HDL 74, triglycerides 64, and glucose 42. He had fasted for 15 hours.  Other than some coolness to his extremities he was asymptomatic  There is no family history diabetes    Review of Systems   At the time of the blood draw he denied mental confusion, slurred speech, tachycardia, or diaphoresis.     Objective:   Physical Exam Appears healthy and well-nourished & in no acute distress  No carotid bruits are present.No neck vein distention present at 10 - 15 degrees. Thyroid normal to palpation  Heart rhythm and rate are normal with no gallop or murmur  Chest is clear with no increased work of breathing  No clubbing, cyanosis or edema present.  Pedal pulses are intact   No ischemic skin changes are present . Fingernails healthy   Alert and oriented. Strength, tone, DTRs reflexes normal          Assessment & Plan:  #1 hypoglycemia after 15 hour fast  #2 excellent lipids  Plan: See orders and recommendations

## 2014-03-22 ENCOUNTER — Encounter (HOSPITAL_COMMUNITY): Payer: Self-pay | Admitting: Emergency Medicine

## 2014-03-22 ENCOUNTER — Emergency Department (HOSPITAL_COMMUNITY)
Admission: EM | Admit: 2014-03-22 | Discharge: 2014-03-22 | Disposition: A | Discharge status: Home / Self Care | Payer: BC Managed Care – PPO | Attending: Emergency Medicine | Admitting: Emergency Medicine

## 2014-03-22 DIAGNOSIS — Z8639 Personal history of other endocrine, nutritional and metabolic disease: Secondary | ICD-10-CM | POA: Insufficient documentation

## 2014-03-22 DIAGNOSIS — Z87891 Personal history of nicotine dependence: Secondary | ICD-10-CM | POA: Diagnosis not present

## 2014-03-22 DIAGNOSIS — Z79899 Other long term (current) drug therapy: Secondary | ICD-10-CM | POA: Insufficient documentation

## 2014-03-22 DIAGNOSIS — R040 Epistaxis: Secondary | ICD-10-CM | POA: Diagnosis present

## 2014-03-22 DIAGNOSIS — Z86718 Personal history of other venous thrombosis and embolism: Secondary | ICD-10-CM | POA: Diagnosis not present

## 2014-03-22 DIAGNOSIS — K219 Gastro-esophageal reflux disease without esophagitis: Secondary | ICD-10-CM | POA: Insufficient documentation

## 2014-03-22 DIAGNOSIS — Z7982 Long term (current) use of aspirin: Secondary | ICD-10-CM | POA: Diagnosis not present

## 2014-03-22 DIAGNOSIS — Z8679 Personal history of other diseases of the circulatory system: Secondary | ICD-10-CM | POA: Diagnosis not present

## 2014-03-22 DIAGNOSIS — Z8619 Personal history of other infectious and parasitic diseases: Secondary | ICD-10-CM | POA: Diagnosis not present

## 2014-03-22 MED ORDER — LORATADINE 10 MG PO TABS: 10.0000 mg | ORAL_TABLET | Freq: Every day | ORAL | Status: DC

## 2014-03-22 NOTE — ED Notes (Signed)
Patient states had a nose bleed that started around 0400 and ended 0745.   Patient states "I would bleed, it would clot, then I would get the clots out, then it would start bleeding again.

## 2014-03-22 NOTE — ED Provider Notes (Signed)
CSN: [id number]     Arrival date & time 03/22/14  0818 History   First MD Initiated Contact with Patient 03/22/14 [phone]     Chief Complaint  Patient presents with  . Epistaxis     (Consider location/radiation/quality/duration/timing/severity/associated sxs/prior Treatment) Patient is a 53 y.o. male presenting with nosebleeds. The history is provided by the patient. No language interpreter was used.  Epistaxis Location:  L nare Severity:  Moderate Duration:  3 hours Timing:  Intermittent Progression:  Resolved Chronicity:  New Context: aspirin use and weather change   Context: not anticoagulants, not elevation change, not home oxygen, not hypertension, not nose picking, not recent infection and not thrombocytopenia   Relieved by:  Applying pressure Worsened by:  Heat Ineffective treatments:  None tried Associated symptoms: no blood in oropharynx, no congestion, no cough, no facial pain, no headaches, no sinus pain, no sneezing, no sore throat and no syncope   Risk factors: allergies   Risk factors: no alcohol use, no change in medication, no frequent nosebleeds, no head and neck surgery, no head and neck tumor, no intranasal steroids, no liver disease, no radiation treatment, no recent chemotherapy, no recent nasal surgery and no sinus problems     Past Medical History  Diagnosis Date  . GERD (gastroesophageal reflux disease)   . Hyperlipidemia   . Endocarditis 10-1999     RECURRENCE 02-2008  . History of chest tube placement 1980    R SIDE FOR SPONTANEOUS  PNEUMOTHORAX  . Periodontitis     PMH  . C. difficile colitis 2009  . DVT prophylaxis     Dr Ricard Dillon , post MV repair  . External hemorrhoids without mention of complication 37/79/3968    Colonoscopy   Past Surgical History  Procedure Laterality Date  . Colonoscopy  2005    rectal bleeding; negative exam; [facility]  . Dental extractions      DENTAL INFECTION  . Thoracotomy  2009    MITRAL VAVLE REPAIR   Family  History  Problem Relation Age of Onset  . Ovarian cancer Sister   . Diabetes Neg Hx   . Heart disease Neg Hx   . Stroke Neg Hx    History  Substance Use Topics  . Smoking status: Former Smoker    Quit date: 08/08/1989  . Smokeless tobacco: Former Systems developer    Quit date: 08/08/1989  . Alcohol Use: No    Review of Systems  HENT: Positive for nosebleeds. Negative for congestion, sneezing and sore throat.   Respiratory: Negative for cough.   Cardiovascular: Negative for syncope.  Neurological: Negative for headaches.  All other systems reviewed and are negative.     Allergies  Clarithromycin  Home Medications   Prior to Admission medications   Medication Sig Start Date End Date Taking? Authorizing Provider  aspirin EC 325 MG tablet Take 325 mg by mouth daily.   Yes Historical Provider, MD  KRILL OIL PO Take 1 tablet by mouth daily.    Yes Historical Provider, MD  ranitidine (ZANTAC) 150 MG tablet Take 1 tablet (150 mg total) by mouth 2 (two) times daily. 09/30/12  Yes Hendricks Limes, MD  tadalafil (CIALIS) 20 MG tablet 1/2- 1 every 3 days as needed 11/03/13  Yes Hendricks Limes, MD  loratadine (CLARITIN) 10 MG tablet Take 1 tablet (10 mg total) by mouth daily. Patient not taking: Reported on 03/22/2014 09/30/12   Hendricks Limes, MD   BP 133/78 mmHg  Pulse 80  Temp(Src) 98.4 F (36.9 C) (Oral)  Resp 18  SpO2 100% Physical Exam  Constitutional: He is oriented to person, place, and time. He appears well-developed and well-nourished. No distress.  HENT:  Head: Normocephalic and atraumatic.  Nose: Nose normal.  Mouth/Throat: Oropharynx is clear and moist. No oropharyngeal exudate.  Some dried blood noted in the left nare without active bleeding. Right nare unremarkable.   Eyes: Conjunctivae and EOM are normal.  Neck: Normal range of motion.  Cardiovascular: Normal rate and regular rhythm.  Exam reveals no gallop and no friction rub.   No murmur heard. Pulmonary/Chest: Effort  normal and breath sounds normal. He has no wheezes. He has no rales. He exhibits no tenderness.  Abdominal: Soft. There is no tenderness.  Musculoskeletal: Normal range of motion.  Neurological: He is alert and oriented to person, place, and time. Coordination normal.  Speech is goal-oriented. Moves limbs without ataxia.   Skin: Skin is warm and dry.  Psychiatric: He has a normal mood and affect. His behavior is normal.  Nursing note and vitals reviewed.   ED Course  Procedures (including critical care time) Labs Review Labs Reviewed - No data to display  Imaging Review No results found.   EKG Interpretation None      MDM   Final diagnoses:  Left-sided nosebleed    9:05 AM Patient's nose no longer bleeding. Vitals stable and patient afebrile. No hypertension noted. Patient's nosebleed likely due to dry air outside and having the heat on in his house. Patient will have refill of claritin.    Alvina Chou, PA-C 03/22/14 0910  Dorie Rank, MD 03/23/14 [phone]

## 2014-03-22 NOTE — ED Notes (Signed)
Pt takes aspirin and not thinners.

## 2014-03-22 NOTE — ED Notes (Signed)
Pt reports nose bleed with quarter size clots that woke him up around 0430. Stopped around 0800. Got in shower and continued to pinch. No bleeding noted at this time.

## 2014-03-22 NOTE — Discharge Instructions (Signed)
Take Claritin daily as directed. Refer to attached documents for more information.

## 2014-03-29 HISTORY — PX: PROSTATE BIOPSY: SHX241

## 2014-05-31 ENCOUNTER — Ambulatory Visit (INDEPENDENT_AMBULATORY_CARE_PROVIDER_SITE_OTHER)
Admission: RE | Admit: 2014-05-31 | Discharge: 2014-05-31 | Disposition: A | Discharge status: Home / Self Care | Payer: 59 | Source: Ambulatory Visit | Attending: Internal Medicine | Admitting: Internal Medicine

## 2014-05-31 ENCOUNTER — Encounter (HOSPITAL_COMMUNITY): Payer: Self-pay | Admitting: Emergency Medicine

## 2014-05-31 ENCOUNTER — Encounter: Payer: Self-pay | Admitting: Internal Medicine

## 2014-05-31 ENCOUNTER — Ambulatory Visit (INDEPENDENT_AMBULATORY_CARE_PROVIDER_SITE_OTHER): Payer: 59 | Admitting: Internal Medicine

## 2014-05-31 ENCOUNTER — Inpatient Hospital Stay (HOSPITAL_COMMUNITY)
Admission: EM | Admit: 2014-05-31 | Discharge: 2014-06-02 | DRG: 812 | Disposition: A | Discharge status: Home / Self Care | Payer: 59 | Attending: Internal Medicine | Admitting: Internal Medicine

## 2014-05-31 ENCOUNTER — Other Ambulatory Visit (INDEPENDENT_AMBULATORY_CARE_PROVIDER_SITE_OTHER): Payer: 59

## 2014-05-31 VITALS — BP 140/70 | HR 100 | Temp 98.6°F | Ht 71.0 in | Wt 162.8 lb

## 2014-05-31 DIAGNOSIS — K0530 Chronic periodontitis, unspecified: Secondary | ICD-10-CM | POA: Diagnosis present

## 2014-05-31 DIAGNOSIS — K319 Disease of stomach and duodenum, unspecified: Secondary | ICD-10-CM | POA: Diagnosis present

## 2014-05-31 DIAGNOSIS — Z8546 Personal history of malignant neoplasm of prostate: Secondary | ICD-10-CM | POA: Diagnosis not present

## 2014-05-31 DIAGNOSIS — I059 Rheumatic mitral valve disease, unspecified: Secondary | ICD-10-CM

## 2014-05-31 DIAGNOSIS — D649 Anemia, unspecified: Secondary | ICD-10-CM | POA: Diagnosis not present

## 2014-05-31 DIAGNOSIS — E785 Hyperlipidemia, unspecified: Secondary | ICD-10-CM

## 2014-05-31 DIAGNOSIS — Z87891 Personal history of nicotine dependence: Secondary | ICD-10-CM

## 2014-05-31 DIAGNOSIS — R000 Tachycardia, unspecified: Secondary | ICD-10-CM

## 2014-05-31 DIAGNOSIS — Z952 Presence of prosthetic heart valve: Secondary | ICD-10-CM

## 2014-05-31 DIAGNOSIS — T733XXA Exhaustion due to excessive exertion, initial encounter: Secondary | ICD-10-CM | POA: Diagnosis not present

## 2014-05-31 DIAGNOSIS — K449 Diaphragmatic hernia without obstruction or gangrene: Secondary | ICD-10-CM | POA: Diagnosis present

## 2014-05-31 DIAGNOSIS — M5136 Other intervertebral disc degeneration, lumbar region: Secondary | ICD-10-CM | POA: Diagnosis present

## 2014-05-31 DIAGNOSIS — R5383 Other fatigue: Secondary | ICD-10-CM | POA: Diagnosis present

## 2014-05-31 DIAGNOSIS — K219 Gastro-esophageal reflux disease without esophagitis: Secondary | ICD-10-CM | POA: Diagnosis present

## 2014-05-31 DIAGNOSIS — D509 Iron deficiency anemia, unspecified: Secondary | ICD-10-CM | POA: Diagnosis present

## 2014-05-31 HISTORY — DX: Malignant neoplasm of prostate: C61

## 2014-05-31 LAB — URINE MICROSCOPIC-ADD ON

## 2014-05-31 LAB — CBC WITH DIFFERENTIAL/PLATELET
Basophils Absolute: 0 10*3/uL (ref 0.0–0.1)
Basophils Absolute: 0 10*3/uL (ref 0.0–0.1)
Basophils Relative: 0 % (ref 0.0–3.0)
Basophils Relative: 1 % (ref 0–1)
Eosinophils Absolute: 0 10*3/uL (ref 0.0–0.7)
Eosinophils Absolute: 0 10*3/uL (ref 0.0–0.7)
Eosinophils Relative: 0 % (ref 0.0–5.0)
Eosinophils Relative: 1 % (ref 0–5)
HCT: 18.7 % — CL (ref 39.0–52.0)
HCT: 20.7 % — ABNORMAL LOW (ref 39.0–52.0)
Hemoglobin: 5.9 g/dL — CL (ref 13.0–17.0)
Hemoglobin: 5.9 g/dL — CL (ref 13.0–17.0)
Lymphocytes Relative: 39 % (ref 12.0–46.0)
Lymphocytes Relative: 42 % (ref 12–46)
Lymphs Abs: 1.7 10*3/uL (ref 0.7–4.0)
Lymphs Abs: 1.6 10*3/uL (ref 0.7–4.0)
MCH: 20.3 pg — ABNORMAL LOW (ref 26.0–34.0)
MCHC: 31.7 g/dL (ref 30.0–36.0)
MCHC: 28.5 g/dL — ABNORMAL LOW (ref 30.0–36.0)
MCV: 70.6 fl — ABNORMAL LOW (ref 78.0–100.0)
MCV: 71.4 fL — ABNORMAL LOW (ref 78.0–100.0)
Monocytes Absolute: 0.3 10*3/uL (ref 0.1–1.0)
Monocytes Absolute: 0.3 10*3/uL (ref 0.1–1.0)
Monocytes Relative: 8 % (ref 3.0–12.0)
Monocytes Relative: 8 % (ref 3–12)
Neutro Abs: 2.3 10*3/uL (ref 1.4–7.7)
Neutro Abs: 1.9 10*3/uL (ref 1.7–7.7)
Neutrophils Relative %: 53 % (ref 43.0–77.0)
Neutrophils Relative %: 48 % (ref 43–77)
Platelets: 174 10*3/uL (ref 150.0–400.0)
Platelets: 226 10*3/uL (ref 150–400)
RBC: 2.66 Mil/uL — ABNORMAL LOW (ref 4.22–5.81)
RBC: 2.9 MIL/uL — ABNORMAL LOW (ref 4.22–5.81)
RDW: 41.2 % — ABNORMAL HIGH (ref 11.5–15.5)
WBC: 4.3 10*3/uL (ref 4.0–10.5)
WBC: 3.8 10*3/uL — ABNORMAL LOW (ref 4.0–10.5)

## 2014-05-31 LAB — URINALYSIS, ROUTINE W REFLEX MICROSCOPIC
Bilirubin Urine: NEGATIVE
Glucose, UA: NEGATIVE mg/dL
Ketones, ur: NEGATIVE mg/dL
Leukocytes, UA: NEGATIVE
Nitrite: NEGATIVE
Protein, ur: NEGATIVE mg/dL
Specific Gravity, Urine: 1.013 (ref 1.005–1.030)
Urobilinogen, UA: 0.2 mg/dL (ref 0.0–1.0)
pH: 6 (ref 5.0–8.0)

## 2014-05-31 LAB — HEPATIC FUNCTION PANEL
ALT: 29 U/L (ref 0–53)
AST: 79 U/L — ABNORMAL HIGH (ref 0–37)
Albumin: 4.4 g/dL (ref 3.5–5.2)
Alkaline Phosphatase: 44 U/L (ref 39–117)
Bilirubin, Direct: 0.3 mg/dL (ref 0.0–0.3)
Total Bilirubin: 1.6 mg/dL — ABNORMAL HIGH (ref 0.2–1.2)
Total Protein: 6.9 g/dL (ref 6.0–8.3)

## 2014-05-31 LAB — COMPREHENSIVE METABOLIC PANEL
ALT: 31 U/L (ref 0–53)
AST: 89 U/L — ABNORMAL HIGH (ref 0–37)
Albumin: 4.4 g/dL (ref 3.5–5.2)
Alkaline Phosphatase: 47 U/L (ref 39–117)
Anion gap: 6 (ref 5–15)
BUN: 9 mg/dL (ref 6–23)
CO2: 25 mmol/L (ref 19–32)
Calcium: 9 mg/dL (ref 8.4–10.5)
Chloride: 105 mmol/L (ref 96–112)
Creatinine, Ser: 1.02 mg/dL (ref 0.50–1.35)
GFR calc Af Amer: >90 mL/min (ref >90)
GFR calc non Af Amer: 81 mL/min — ABNORMAL LOW (ref >90)
Glucose, Bld: 106 mg/dL — ABNORMAL HIGH (ref 70–99)
Potassium: 3.5 mmol/L (ref 3.5–5.1)
Sodium: 136 mmol/L (ref 135–145)
Total Bilirubin: 1.6 mg/dL — ABNORMAL HIGH (ref 0.3–1.2)
Total Protein: 6.7 g/dL (ref 6.0–8.3)

## 2014-05-31 LAB — BASIC METABOLIC PANEL
BUN: 11 mg/dL (ref 6–23)
CO2: 24 mEq/L (ref 19–32)
Calcium: 9.2 mg/dL (ref 8.4–10.5)
Chloride: 107 mEq/L (ref 96–112)
Creatinine, Ser: 0.97 mg/dL (ref 0.40–1.50)
GFR: 103.53 mL/min (ref >60.00)
Glucose, Bld: 95 mg/dL (ref 70–99)
Potassium: 4 mEq/L (ref 3.5–5.1)
Sodium: 138 mEq/L (ref 135–145)

## 2014-05-31 LAB — POC OCCULT BLOOD, ED: Fecal Occult Bld: NEGATIVE

## 2014-05-31 LAB — TSH: TSH: 1.95 u[IU]/mL (ref 0.35–4.50)

## 2014-05-31 LAB — PREPARE RBC (CROSSMATCH)

## 2014-05-31 LAB — TROPONIN I: Troponin I: <0.03 ng/mL (ref <0.031)

## 2014-05-31 LAB — T4, FREE: Free T4: 0.75 ng/dL (ref 0.60–1.60)

## 2014-05-31 MED ORDER — ACETAMINOPHEN 650 MG RE SUPP: 650.0000 mg | Freq: Four times a day (QID) | RECTAL | Status: DC | PRN

## 2014-05-31 MED ORDER — ONDANSETRON HCL 4 MG/2ML IJ SOLN: 4.0000 mg | Freq: Four times a day (QID) | INTRAMUSCULAR | Status: DC | PRN

## 2014-05-31 MED ORDER — OXYCODONE HCL 5 MG PO TABS: 5.0000 mg | ORAL_TABLET | ORAL | Status: DC | PRN

## 2014-05-31 MED ORDER — VITAMIN B-1 100 MG PO TABS
100.0000 mg | ORAL_TABLET | Freq: Every day | ORAL | Status: DC
  Administered 2014-06-01 – 2014-06-02 (×2): 100 mg via ORAL
  Filled 2014-05-31 (×2): qty 1

## 2014-05-31 MED ORDER — FOLIC ACID 1 MG PO TABS
1.0000 mg | ORAL_TABLET | Freq: Every day | ORAL | Status: DC
  Administered 2014-06-01 – 2014-06-02 (×2): 1 mg via ORAL
  Filled 2014-05-31 (×2): qty 1

## 2014-05-31 MED ORDER — LORATADINE 10 MG PO TABS
10.0000 mg | ORAL_TABLET | Freq: Every day | ORAL | Status: DC
  Administered 2014-06-01 – 2014-06-02 (×2): 10 mg via ORAL
  Filled 2014-05-31 (×2): qty 1

## 2014-05-31 MED ORDER — SODIUM CHLORIDE 0.9 % IV SOLN
10.0000 mL/h | Freq: Once | INTRAVENOUS | Status: AC
  Administered 2014-05-31: 10 mL/h via INTRAVENOUS

## 2014-05-31 MED ORDER — ACETAMINOPHEN 325 MG PO TABS: 650.0000 mg | ORAL_TABLET | Freq: Four times a day (QID) | ORAL | Status: DC | PRN

## 2014-05-31 MED ORDER — SODIUM CHLORIDE 0.9 % IJ SOLN
3.0000 mL | Freq: Two times a day (BID) | INTRAMUSCULAR | Status: DC
  Administered 2014-06-01 – 2014-06-02 (×2): 3 mL via INTRAVENOUS

## 2014-05-31 MED ORDER — ADULT MULTIVITAMIN W/MINERALS CH
1.0000 | ORAL_TABLET | Freq: Every day | ORAL | Status: DC
  Administered 2014-06-01 – 2014-06-02 (×2): 1 via ORAL
  Filled 2014-05-31 (×2): qty 1

## 2014-05-31 MED ORDER — SODIUM CHLORIDE 0.9 % IV SOLN
80.0000 mg | Freq: Two times a day (BID) | INTRAVENOUS | Status: DC
  Administered 2014-06-01 (×2): 80 mg via INTRAVENOUS
  Filled 2014-05-31 (×3): qty 80

## 2014-05-31 MED ORDER — FUROSEMIDE 10 MG/ML IJ SOLN
20.0000 mg | Freq: Once | INTRAMUSCULAR | Status: AC
  Administered 2014-06-01: 20 mg via INTRAVENOUS
  Filled 2014-05-31: qty 2

## 2014-05-31 MED ORDER — SODIUM CHLORIDE 0.9 % IV SOLN: Freq: Once | INTRAVENOUS | Status: DC

## 2014-05-31 MED ORDER — SODIUM CHLORIDE 0.9 % IV SOLN
INTRAVENOUS | Status: DC
  Administered 2014-06-01: 03:00:00 via INTRAVENOUS

## 2014-05-31 MED ORDER — ONDANSETRON HCL 4 MG PO TABS: 4.0000 mg | ORAL_TABLET | Freq: Four times a day (QID) | ORAL | Status: DC | PRN

## 2014-05-31 NOTE — H&P (Signed)
[facility] History and Physical  AVIR DERUITER ION:[id number] DOB: December 19, 1960 DOA: 05/31/2014  Referring physician: Unice Cobble, PA PCP: Unice Cobble, MD   Chief Complaint: fatigue SOB  HPI: David [name] is a 54 y.o. male presents with fatigue and SOB. He has noted that he has been tired for a couple of weeks. He gets SOB with exertion. He states he has been feeling light headed and also dizzy. He did not actually pass out. He has noted cramping in his legs also. He has no chest pain noted. He has had a high heart rate when he exerts himself. He was seen at his PCP today and was noted to be severely anemic. Patient is being admitted for further workup. He states his stools have been regular looking. He does related nose bleed last week for about an hour. In the ED his Hgb is noted to 5.9 at this time.   Review of Systems:  Constitutional:  No weight loss, night sweats, Fevers, chills, ++fatigue.  HEENT:  ++headaches, Difficulty swallowing,Tooth/dental problems,Sore throat Cardio-vascular:  No chest pain, Orthopnea, PND, swelling in lower extremities, anasarca, ++dizziness, palpitations  GI:  No heartburn, indigestion, abdominal pain, nausea, vomiting, diarrhea  Resp:  ++shortness of breath with exertion. No excess mucus, no productive cough, No non-productive cough, No coughing up of blood.No change in color of mucus Skin:  no rash or lesions.  GU:  no dysuria, change in color of urine, no urgency or frequency Musculoskeletal:  No joint pain or swelling. No decreased range of motion Psych:  No change in mood or affect. No depression or anxiety. No memory loss.   Past Medical History  Diagnosis Date  . GERD (gastroesophageal reflux disease)   . Hyperlipidemia   . Endocarditis 10-1999     RECURRENCE 02-2008  . History of chest tube placement 1980    R SIDE FOR SPONTANEOUS  PNEUMOTHORAX  . Periodontitis     PMH  . C. difficile colitis 2009  . DVT prophylaxis       Dr Ricard Dillon , post MV repair  . External hemorrhoids without mention of complication 24/40/1027    Colonoscopy   Past Surgical History  Procedure Laterality Date  . Colonoscopy  2005    rectal bleeding; negative exam; [facility]  . Dental extractions      DENTAL INFECTION  . Thoracotomy  2009    MITRAL VAVLE REPAIR  . Prostate surgery     Social History:  reports that he quit smoking about 24 years ago. He quit smokeless tobacco use about 24 years ago. He reports that he does not drink alcohol or use illicit drugs.  Allergies  Allergen Reactions  . Clarithromycin     REACTION: NAUSEA    Family History  Problem Relation Age of Onset  . Ovarian cancer Sister   . Diabetes Neg Hx   . Heart disease Neg Hx   . Stroke Neg Hx      Prior to Admission medications   Medication Sig Start Date End Date Taking? Authorizing Provider  aspirin EC 325 MG tablet Take 325 mg by mouth daily.    Historical Provider, MD  KRILL OIL PO Take 1 tablet by mouth daily.     Historical Provider, MD  loratadine (CLARITIN) 10 MG tablet Take 1 tablet (10 mg total) by mouth daily. 03/22/14   Kaitlyn Szekalski, PA-C  ranitidine (ZANTAC) 150 MG tablet Take 1 tablet (150 mg total) by mouth 2 (two) times daily. 09/30/12  Hendricks Limes, MD  tadalafil (CIALIS) 20 MG tablet 1/2- 1 every 3 days as needed 11/03/13   Hendricks Limes, MD   Physical Exam: Filed Vitals:   05/31/14 1742  BP: 137/82  Pulse: 96  Temp: 98.4 F (36.9 C)  TempSrc: Oral  Resp: 16  SpO2: 100%    Wt Readings from Last 3 Encounters:  05/31/14 73.823 kg (162 lb 12 oz)  03/18/14 74.39 kg (164 lb)  02/08/14 74.617 kg (164 lb 8 oz)    General:  Appears calm and comfortable Eyes: PERRL, normal lids, irises & conjunctiva ENT: grossly normal hearing, lips & tongue Neck: no LAD, masses or thyromegaly Cardiovascular: RRR, no m/r/g. No LE edema. Respiratory: CTA bilaterally, no w/r/r. Normal respiratory effort. Abdomen: soft,  ntnd Skin: no rash or induration seen on limited exam Musculoskeletal: grossly normal tone BUE/BLE Psychiatric: grossly normal mood and affect, speech fluent and appropriate Neurologic: grossly non-focal.          Labs on Admission:  Basic Metabolic Panel:  Recent Labs Lab 05/31/14 1621 05/31/14 1751  NA 138 136  K 4.0 3.5  CL 107 105  CO2 24 25  GLUCOSE 95 106*  BUN 11 9  CREATININE 0.97 1.02  CALCIUM 9.2 9.0   Liver Function Tests:  Recent Labs Lab 05/31/14 1621 05/31/14 1751  AST 79* 89*  ALT 29 31  ALKPHOS 44 47  BILITOT 1.6* 1.6*  PROT 6.9 6.7  ALBUMIN 4.4 4.4   No results for input(s): LIPASE, AMYLASE in the last 168 hours. No results for input(s): AMMONIA in the last 168 hours. CBC:  Recent Labs Lab 05/31/14 1621 05/31/14 1751  WBC 4.3 3.8*  NEUTROABS 2.3 1.9  HGB 5.9 cL* 5.9*  HCT 18.7* 20.7*  MCV 70.6* 71.4*  PLT 174.0 226   Cardiac Enzymes: No results for input(s): CKTOTAL, CKMB, CKMBINDEX, TROPONINI in the last 168 hours.  BNP (last 3 results) No results for input(s): BNP in the last 8760 hours.  ProBNP (last 3 results) No results for input(s): PROBNP in the last 8760 hours.  CBG: No results for input(s): GLUCAP in the last 168 hours.  Radiological Exams on Admission: No results found.    Assessment/Plan Active Problems:   GERD   Anemia   Hyperlipidemia   Symptomatic anemia   1. Sympomatic anemia -will admit for blood transfusion -ordered 2Units PRBC now -monitor labs -check stool occult blood -will need GI consult -started on IV protonix  2. GERD -will continue with IV protonix  3. hyperlipidemia -monitor labs   Code Status: Full Code (must indicate code status--if unknown or must be presumed, indicate so) DVT Prophylaxis:SCD Family Communication: Wife (indicate person spoken with, if applicable, with phone number if by telephone) Disposition Plan: Home (indicate anticipated LOS)  Time spent:  65min  Lakysha Kossman A [facility] Pager [phone]

## 2014-05-31 NOTE — Progress Notes (Signed)
Report received. Awaiting patient's arrival.  

## 2014-05-31 NOTE — Patient Instructions (Addendum)
Your next office appointment will be determined based upon review of your pending labs . Those instructions will be transmitted to you through My Chart  Critical values will be called to  Mobile [phone]  Followup as needed for your acute issue. Please report any significant change in your symptoms.

## 2014-05-31 NOTE — ED Notes (Signed)
Pt sent here for further eval of low hemoglobin; pt unsure of cause and only complaint is fatigue

## 2014-05-31 NOTE — Progress Notes (Signed)
Pre visit review using our clinic review tool, if applicable. No additional management support is needed unless otherwise documented below in the visit note. 

## 2014-05-31 NOTE — ED Notes (Signed)
Admitting at bedside 

## 2014-05-31 NOTE — ED Notes (Signed)
Pt from home for eval of ongoing weakness and fatigue x3 weeks, pt denies any blood in stool or hemoptysis. Also denies any abdominal pain or sob. NAD noted. Pt reports hx of anemia in the past but does not recall the reason.

## 2014-05-31 NOTE — Progress Notes (Signed)
   Subjective:    Patient ID: David [name], male    DOB: 12/01/1960, 54 y.o.   MRN: [id number]  HPI  He's had exertional fatigue and tachycardia since November 2015. He does not describe exertional dyspnea. The symptoms mainly occur with stairs. He does not have the symptoms at rest. He does have occasional postural dizziness.  He questioned whether this might be related to diabetes; otherwise there is no history to suggest reactive hypoglycemia.  He has occasional rhinitis and some snoring; extensive review of systems is negative otherwise.  He did have endocarditis in 2001. He had a valve repair in November 2009.  Review of Systems He denies fever, chills, sweats, or weight change. He has no blurred vision, double vision, vision loss. He also denies itchy watery eyes, sneezing, or angioedema. He has no hoarseness or difficulty swallowing. He has no abdominal pain, melena, or rectal bleeding. There is no associated shortness of breath, cough, sputum production. He has no associated pedal edema or paroxysmal nocturnal dyspnea. He also denies joint pain, swelling, or redness. He has no associated myalgias or muscle weakness. There is no change in hair, skin, nails. There is no document apnea. There is no abnormal bruising or bleeding. He denies anxiety or depression.    Objective:   Physical Exam  Pertinent or positive findings include: Upper dental plate is present. There is a well-healed scar over the nose  First and second heart sounds are accentuated markedly. He has a slight tachycardia with  flow murmur at the apex.Occasional premature.  General appearance :Thin but adequately nourished; in no distress. Eyes: No conjunctival inflammation or scleral icterus is present. Oral exam: Lips and gums are healthy appearing.There is no oropharyngeal erythema or exudate noted.  Heart:  regular rhythm. S1 and S2 normal without gallop,  click, rub or other extra sounds   Lungs:Chest clear to  auscultation; no wheezes, rhonchi,rales ,or rubs present.No increased work of breathing.  Abdomen: bowel sounds normal, soft and non-tender without masses, organomegaly or hernias noted.  No guarding or rebound. No flank tenderness to percussion. Vascular : all pulses equal ; no bruits present. Skin:Warm & dry.  Intact without suspicious lesions or rashes ; no  tenting Lymphatic: No lymphadenopathy is noted about the head, neck, axilla Neuro: Strength, tone  normal.       Assessment & Plan:  #1 exertional fatigue and tachycardia  Plan: See orders & recommendations. Stress test will be scheduled.

## 2014-06-01 ENCOUNTER — Encounter (HOSPITAL_COMMUNITY): Payer: Self-pay | Admitting: General Practice

## 2014-06-01 DIAGNOSIS — D509 Iron deficiency anemia, unspecified: Principal | ICD-10-CM

## 2014-06-01 LAB — COMPREHENSIVE METABOLIC PANEL
ALT: 29 U/L (ref 0–53)
AST: 70 U/L — ABNORMAL HIGH (ref 0–37)
Albumin: 3.7 g/dL (ref 3.5–5.2)
Alkaline Phosphatase: 42 U/L (ref 39–117)
Anion gap: 5 (ref 5–15)
BUN: 6 mg/dL (ref 6–23)
CO2: 29 mmol/L (ref 19–32)
Calcium: 9.1 mg/dL (ref 8.4–10.5)
Chloride: 106 mmol/L (ref 96–112)
Creatinine, Ser: 1.08 mg/dL (ref 0.50–1.35)
GFR calc Af Amer: 88 mL/min — ABNORMAL LOW (ref >90)
GFR calc non Af Amer: 76 mL/min — ABNORMAL LOW (ref >90)
Glucose, Bld: 116 mg/dL — ABNORMAL HIGH (ref 70–99)
Potassium: 4.1 mmol/L (ref 3.5–5.1)
Sodium: 140 mmol/L (ref 135–145)
Total Bilirubin: 1.9 mg/dL — ABNORMAL HIGH (ref 0.3–1.2)
Total Protein: 6.6 g/dL (ref 6.0–8.3)

## 2014-06-01 LAB — CBC
HCT: 28.7 % — ABNORMAL LOW (ref 39.0–52.0)
Hemoglobin: 9 g/dL — ABNORMAL LOW (ref 13.0–17.0)
MCH: 23.8 pg — ABNORMAL LOW (ref 26.0–34.0)
MCHC: 31.4 g/dL (ref 30.0–36.0)
MCV: 75.9 fL — ABNORMAL LOW (ref 78.0–100.0)
Platelets: 185 10*3/uL (ref 150–400)
RBC: 3.78 MIL/uL — ABNORMAL LOW (ref 4.22–5.81)
RDW: 32.3 % — ABNORMAL HIGH (ref 11.5–15.5)
WBC: 4.2 10*3/uL (ref 4.0–10.5)

## 2014-06-01 LAB — TROPONIN I
Troponin I: <0.03 ng/mL (ref <0.031)
Troponin I: <0.03 ng/mL (ref <0.031)

## 2014-06-01 LAB — TSH: TSH: 0.941 u[IU]/mL (ref 0.350–4.500)

## 2014-06-01 LAB — PATHOLOGIST SMEAR REVIEW

## 2014-06-01 LAB — GLUCOSE, CAPILLARY: Glucose-Capillary: 151 mg/dL — ABNORMAL HIGH (ref 70–99)

## 2014-06-01 MED ORDER — ENSURE COMPLETE PO LIQD
237.0000 mL | Freq: Two times a day (BID) | ORAL | Status: DC
  Administered 2014-06-01 – 2014-06-02 (×4): 237 mL via ORAL

## 2014-06-01 NOTE — Progress Notes (Signed)
[facility] PROGRESS NOTE   David [name] GNF:[id number] DOB: 25-Sep-1960 DOA: 05/31/2014 PCP: Unice Cobble, MD  HPI/Subjective: Denies any specific complaints, no abdominal pain.  Assessment/Plan: Active Problems:   GERD   Anemia   Hyperlipidemia   Symptomatic anemia   Sympomatic anemia -Patient admitted with hemoglobin of 5.9, baseline hemoglobin normal. -Status post transfusion of 2 units of packed RBCs, hemoglobin is 9.0. -FOBT is pending, gastroenterology consulted. -Patient is on IV Protonix twice a day, has history of GERD but denies any hematemesis. -.Has history of iron deficiency anemia  GERD -will continue with IV protonix, No symptoms.  Hyperlipidemia -monitor labs  Code Status: Full Code Family Communication: Plan discussed with the patient. Disposition Plan: Remains inpatient Diet: Diet Heart  Consultants: GI  Procedures: None  Antibiotics: None   Objective: Filed Vitals:   06/01/14 1313  BP: 112/65  Pulse: 78  Temp: 98.6 F (37 C)  Resp: 18    Intake/Output Summary (Last 24 hours) at 06/01/14 1554 Last data filed at 06/01/14 1447  Gross per 24 hour  Intake 1276.17 ml  Output   2525 ml  Net -1248.83 ml   Filed Weights   05/31/14 2338  Weight: 68.266 kg (150 lb 8 oz)    Exam: General: Alert and awake, oriented x3, not in any acute distress. HEENT: anicteric sclera, pupils reactive to light and accommodation, EOMI CVS: S1-S2 clear, no murmur rubs or gallops Chest: clear to auscultation bilaterally, no wheezing, rales or rhonchi Abdomen: soft nontender, nondistended, normal bowel sounds, no organomegaly Extremities: no cyanosis, clubbing or edema noted bilaterally Neuro: Cranial nerves II-XII intact, no focal neurological deficits  Data Reviewed: Basic Metabolic Panel:  Recent Labs Lab 05/31/14 1621 05/31/14 1751 06/01/14 0800  NA 138 136 140  K 4.0 3.5 4.1  CL 107 105 106  CO2 24 25 29   GLUCOSE 95 106* 116*    BUN 11 9 6   CREATININE 0.97 1.02 1.08  CALCIUM 9.2 9.0 9.1   Liver Function Tests:  Recent Labs Lab 05/31/14 1621 05/31/14 1751 06/01/14 0800  AST 79* 89* 70*  ALT 29 31 29   ALKPHOS 44 47 42  BILITOT 1.6* 1.6* 1.9*  PROT 6.9 6.7 6.6  ALBUMIN 4.4 4.4 3.7   No results for input(s): LIPASE, AMYLASE in the last 168 hours. No results for input(s): AMMONIA in the last 168 hours. CBC:  Recent Labs Lab 05/31/14 1621 05/31/14 1751 06/01/14 0800  WBC 4.3 3.8* 4.2  NEUTROABS 2.3 1.9  --   HGB 5.9 cL* 5.9* 9.0*  HCT 18.7* 20.7* 28.7*  MCV 70.6* 71.4* 75.9*  PLT 174.0 226 185   Cardiac Enzymes:  Recent Labs Lab 05/31/14 1950 06/01/14 0800 06/01/14 1130  TROPONINI <0.03 <0.03 <0.03   BNP (last 3 results) No results for input(s): BNP in the last 8760 hours.  ProBNP (last 3 results) No results for input(s): PROBNP in the last 8760 hours.  CBG:  Recent Labs Lab 06/01/14 0812  GLUCAP 151*    Micro No results found for this or any previous visit (from the past 240 hour(s)).   Studies: Dg Chest 2 View  06/01/2014   CLINICAL DATA:  Shortness of breath with exertion for 1 month.  EXAM: CHEST  2 VIEW  COMPARISON:  05/14/2011  FINDINGS: Prior valve replacement. Heart and mediastinal contours are within normal limits. No focal opacities or effusions. No acute bony abnormality.  IMPRESSION: Heart and mediastinal contours are within normal limits. No focal opacities or effusions.  No acute bony abnormality. No active cardiopulmonary disease.   Electronically Signed   By: Rolm Baptise M.D.   On: 06/01/2014 09:14    Scheduled Meds: . sodium chloride   Intravenous Once  . feeding supplement (ENSURE COMPLETE)  237 mL Oral BID BM  . folic acid  1 mg Oral Daily  . loratadine  10 mg Oral Daily  . multivitamin with minerals  1 tablet Oral Daily  . pantoprazole (PROTONIX) IV  80 mg Intravenous Q12H  . sodium chloride  3 mL Intravenous Q12H  . thiamine  100 mg Oral Daily    Continuous Infusions: . sodium chloride 50 mL/hr at 06/01/14 0605       Time spent: 35 minutes    [facility] A  [facility] Pager [phone] If 7PM-7AM, please contact night-coverage at [email or web address], password [facility] 06/01/2014, 3:54 PM  LOS: 1 day

## 2014-06-01 NOTE — Progress Notes (Signed)
Nutrition Brief Note  Patient identified on the Malnutrition Screening Tool (MST) Report  Wt Readings from Last 15 Encounters:  05/31/14 150 lb 8 oz (68.266 kg)  05/31/14 162 lb 12 oz (73.823 kg)  03/18/14 164 lb (74.39 kg)  02/08/14 164 lb 8 oz (74.617 kg)  09/09/13 164 lb (74.39 kg)  09/30/12 167 lb (75.751 kg)  04/13/12 163 lb (73.936 kg)  04/09/12 163 lb 6.4 oz (74.118 kg)  03/24/12 165 lb 6.4 oz (75.025 kg)  11/19/11 157 lb 12.8 oz (71.578 kg)  10/22/11 158 lb (71.668 kg)  07/16/11 160 lb 6.4 oz (72.757 kg)  05/14/11 164 lb 9.6 oz (74.662 kg)  06/14/10 167 lb 9.6 oz (76.023 kg)  04/16/10 163 lb 6.4 oz (74.118 kg)    Body mass index is 21 kg/(m^2). Patient meets criteria for Normal based on current BMI.   Current diet order is Heart Healthy, patient is consuming approximately 100% of meals at this time. Pt reports weight being 162 lbs at PCP yesterday PTA and 162 lbs when he was first admitted. Bed scale during interview read 164 lbs. The 150 measurement seems to be inaccurate. Pt reports no known weight loss and no changes in appetite.  Labs and medications reviewed.   No nutrition interventions warranted at this time. If nutrition issues arise, please consult RD.   Elmer Picker MS Dietetic Intern Pager Number [phone]

## 2014-06-01 NOTE — Progress Notes (Signed)
David [name] [id number] Admission Data: 06/01/2014 12:09 AM Attending Provider: Allyne Gee, MD BBC:[id number] [name], MD Code Status: Full  David [name] is a 54 y.o. male patient admitted from ED:  -No acute distress noted.  -No complaints of shortness of breath.  -No complaints of chest pain.   Cardiac Monitoring: Box # 9 in place. Cardiac monitor yields:normal sinus rhythm.  Blood pressure 114/71, pulse 89, temperature 99 F (37.2 C), temperature source Oral, resp. rate 20, height 5\' 11"  (1.803 m), weight 68.266 kg (150 lb 8 oz), SpO2 100 %.   IV Fluids:  IV in place, occlusive dsg intact without redness, IV cath antecubital right, condition patent and no redness none.   Allergies:  Clarithromycin  Past Medical History:   has a past medical history of GERD (gastroesophageal reflux disease); Hyperlipidemia; Endocarditis (10-1999); History of chest tube placement (1980); Periodontitis; C. difficile colitis (2009); DVT prophylaxis; and External hemorrhoids without mention of complication (94/50/3888).  Past Surgical History:   has past surgical history that includes Colonoscopy (2005); dental extractions; Thoracotomy (2009); and Prostate surgery.  Social History:   reports that he quit smoking about 24 years ago. He quit smokeless tobacco use about 24 years ago. He reports that he does not drink alcohol or use illicit drugs.  Skin: NSI  Patient/Family orientated to room. Information packet given to patient/family. Admission inpatient armband information verified with patient/family to include name and date of birth and placed on patient arm. Side rails up x 2, fall assessment and education completed with patient/family. Patient/family able to verbalize understanding of risk associated with falls and verbalized understanding to call for assistance before getting out of bed. Call light within reach. Patient/family able to voice and demonstrate understanding of unit orientation  instructions.

## 2014-06-01 NOTE — Consult Note (Signed)
[facility] Consult: 12:07 PM 06/01/2014  LOS: 1 day    Referring Provider: Dr Hartford Poli  Primary Care Physician:  Unice Cobble, MD Primary Gastroenterologist:  Dr. Sharlett Iles    Reason for Consultation:  Anemia.  Hgb 5.9.     HPI: David [name] is a 54 y.o. male.  Hx endocarditis 2001 and 2009.  S/p mitral valve repair 08/2007.  EF 55% 10/2009.  C diff colitis 2009. Hx of non-specific anemia dating to at least 02/2008: Transfused then for Hgb of 6.8. Hgb in 2013 was 12 to 12.5.  Up to date on colonoscopy. Transaminases in 40s/90s in 03/2008. GERD sxs controlled with prn Zantac 150 (uses 1 to 2 x per week).     Seen by PMD 2/2 for exertional fatigue (especially stair climbing) and tachycardia dating to 02/2014.  Some tightness in chest when SOB, all resolve with rest.  Occasional postural dizziness. 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   Hgb clocked in at 5.9, MCV low at 70.  Admitted for transfusion of 3 PRBCs, Hgb now 9.0.  FOBT negative.  Troponins negative.  Incidental note made of AST in 70s/80s, T bili 1.9 but otherwise normal LFTs. 2009 ultrasound showed normal liver, GB, ducts but did show small amount of ascites next to liver and GB.   Pt has not seen blood PR, melena.  No nausea, early satiety, no NSAID use other than 81 ASA daily.  No ETOH.  No FHx of SCD, anemia, GI cancers.    Did have large volume epistaxis in 02/2014, by the time he was seen in ED the bleeding had ceased and he required no ENT intervention.  2nd large volume epistaxis occurred last Thursday 1/28 and lasted at least one hour before stopping.     ENDOSCOPIC STUDIES: 03/2012  Colonoscopy average risk screening. Normal study.  01/2004  Colonoscopy for eval of hematochezia and hx  anal fissure: external rrhoids.  Has not had EGD.     Past Medical History  Diagnosis Date  . GERD (gastroesophageal reflux disease)   . Endocarditis 10-1999     RECURRENCE 02-2008  . History of chest tube placement 1980    R SIDE FOR SPONTANEOUS  PNEUMOTHORAX  . Periodontitis     PMH  . C. difficile colitis 2009  . External hemorrhoids without mention of complication 62/83/1517    Colonoscopy  . History of blood transfusion 02/2008, 04/2014.     for anemia.   . Prostate cancer     "5 of the samples from the biopsy was positive; Greeson # is 6"  . DDD (degenerative disc disease), lumbar   . Anemia 2009, 04/2014.     Past Surgical History  Procedure Laterality Date  . Colonoscopy  2005, 03/2012    external rrhoids 2005, normal exam 2013; [facility]  . Multiple tooth extractions  2009    "to prep for mitral valve repair"  . Mitral valve repair  2009    via a mini thoracotomy  .  Prostate biopsy  03/2014  . Cardiac catheterization  2009    Prior to Admission medications   Medication Sig Start Date End Date Taking? Authorizing Provider  aspirin EC 325 MG tablet Take 325 mg by mouth daily.   Yes Historical Provider, MD  KRILL OIL PO Take 1 tablet by mouth daily.    Yes Historical Provider, MD  loratadine (CLARITIN) 10 MG tablet Take 1 tablet (10 mg total) by mouth daily. 03/22/14  Yes Alvina Chou, PA-C  Multiple Vitamin (MULTIVITAMIN WITH MINERALS) TABS tablet Take 1 tablet by mouth daily.   Yes Historical Provider, MD  ranitidine (ZANTAC) 150 MG tablet Take 1 tablet (150 mg total) by mouth 2 (two) times daily. 09/30/12  Yes Hendricks Limes, MD  tadalafil (CIALIS) 20 MG tablet 1/2- 1 every 3 days as needed Patient taking differently: Take 10 mg by mouth daily as needed for erectile dysfunction.  11/03/13  Yes Hendricks Limes, MD    Scheduled Meds: . sodium chloride   Intravenous Once  . feeding supplement (ENSURE COMPLETE)  237 mL Oral BID BM  . folic acid  1 mg Oral Daily   . loratadine  10 mg Oral Daily  . multivitamin with minerals  1 tablet Oral Daily  . pantoprazole (PROTONIX) IV  80 mg Intravenous Q12H  . sodium chloride  3 mL Intravenous Q12H  . thiamine  100 mg Oral Daily   Infusions: . sodium chloride 50 mL/hr at 06/01/14 0605   PRN Meds: acetaminophen **OR** acetaminophen, ondansetron **OR** ondansetron (ZOFRAN) IV, oxyCODONE   Allergies as of 05/31/2014 - Review Complete 05/31/2014  Allergen Reaction Noted  . Clarithromycin      Family History  Problem Relation Age of Onset  . Ovarian cancer Sister   . Diabetes Neg Hx   . Heart disease Neg Hx   . Stroke Neg Hx     History   Social History  . Marital Status: Married    Spouse Name: N/A    Number of Children: N/A  . Years of Education: N/A   Occupational History  . 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 newspaper delivery     delivers papers to distributors, part time on weekends.    Social History Main Topics  . Smoking status: Former Smoker -- 2.00 packs/day for 15 years    Types: Cigarettes    Quit date: 08/08/1989  . Smokeless tobacco: Never Used  . Alcohol Use: Yes     Comment: "stopped drinking in 1991; never treated for abuse"  . Drug Use: Yes    Special: Cocaine, Heroin, Marijuana     Comment: "stopped using drugs in ~ 1984"  . Sexual Activity: No   Other Topics Concern  . Not on file   Social History Narrative   REGULAR EXERCISE 2X WK TREADMILL,WEIGHTS    REVIEW OF SYSTEMS: Constitutional:  Per HPI ENT:  No nose bleeds Pulm:  Exertional SOB but not that bad.  CV:  No palpitations, no LE edema.  GU:  No hematuria, no frequency GI:  Per HPI Heme:  Per HPI   Transfusions:  Per HPI Neuro:  No headaches, no peripheral tingling or numbness Derm:  No itching, no rash or sores.  Endocrine:  No sweats or chills.  No polyuria or dysuria Immunization:  Reviewed.  No entry for flu shot 2015.   Travel:  None beyond local counties in last  few months.    PHYSICAL EXAM: Vital  signs in last 24 hours: Filed Vitals:   06/01/14 0602  BP: 93/61  Pulse: 72  Temp: 99.1 F (37.3 C)  Resp: 18   Wt Readings from Last 3 Encounters:  05/31/14 150 lb 8 oz (68.266 kg)  05/31/14 162 lb 12 oz (73.823 kg)  03/18/14 164 lb (74.39 kg)    General: pleasant, comfortable, looks well Head:  No asymmetry or facial swelling.  Symmetric faces  Eyes:  +conjunctival pallor Ears:  Not HOH  Nose:  No blood or discharge Mouth:  Clear bil.  Dentition in good repair with upper partial in place Neck:  No mass, no TMG, no JVD Lungs:  Clear bil.  Unlabored breathing Heart: RRR with some xtra beats.  NSR with PVCs on tele.  Abdomen:  Soft, non-obese.  No mass, not tender.  Active BS.  No HSM.   Rectal: not repeated.  FOBT negative per exam in ED   Musc/Skeltl: no joint contracture, deformity or swelling Extremities:  No CCE  Neurologic: no tremor, no limb weakness.  Alert and oriented x 3.  Good historian Skin:  No rash, no sores, no telangectasia Tattoos:  none Nodes:  No cervical adenopathy.    Psych:  Pleasant, relaxed.  Cooperative.   Intake/Output from previous day: 02/02 0701 - 02/03 0700 In: 413 [I.V.:10; Blood:303; IV Piggyback:100] Out: 1825 [Urine:1825] Intake/Output this shift: Total I/O In: 237 [Other:237] Out: 400 [Urine:400]  LAB RESULTS:  Recent Labs  05/31/14 1621 05/31/14 1751 06/01/14 0800  WBC 4.3 3.8* 4.2  HGB 5.9 cL* 5.9* 9.0*  HCT 18.7* 20.7* 28.7*  PLT 174.0 226 185   BMET Lab Results  Component Value Date   NA 140 06/01/2014   NA 136 05/31/2014   NA 138 05/31/2014   K 4.1 06/01/2014   K 3.5 05/31/2014   K 4.0 05/31/2014   CL 106 06/01/2014   CL 105 05/31/2014   CL 107 05/31/2014   CO2 29 06/01/2014   CO2 25 05/31/2014   CO2 24 05/31/2014   GLUCOSE 116* 06/01/2014   GLUCOSE 106* 05/31/2014   GLUCOSE 95 05/31/2014   BUN 6 06/01/2014   BUN 9 05/31/2014   BUN 11 05/31/2014   CREATININE 1.08  06/01/2014   CREATININE 1.02 05/31/2014   CREATININE 0.97 05/31/2014   CALCIUM 9.1 06/01/2014   CALCIUM 9.0 05/31/2014   CALCIUM 9.2 05/31/2014   LFT  Recent Labs  05/31/14 1621 05/31/14 1751 06/01/14 0800  PROT 6.9 6.7 6.6  ALBUMIN 4.4 4.4 3.7  AST 79* 89* 70*  ALT 29 31 29   ALKPHOS 44 47 42  BILITOT 1.6* 1.6* 1.9*  BILIDIR 0.3  --   --    PT/INR Lab Results  Component Value Date   INR 4.4 RATIO* 04/18/2008   INR 2.3* 04/01/2008   INR 2.4 SLIGHT HEMOLYSIS* 03/31/2008   Hepatitis Panel No results for input(s): HEPBSAG, HCVAB, HEPAIGM, HEPBIGM in the last 72 hours. C-Diff No components found for: CDIFF Lipase     Component Value Date/Time   LIPASE 29 04/01/2008 0415    Drugs of Abuse  No results found for: LABOPIA, COCAINSCRNUR, LABBENZ, AMPHETMU, THCU, LABBARB   RADIOLOGY STUDIES: Dg Chest 2 View  06/01/2014   CLINICAL DATA:  Shortness of breath with exertion for 1 month.  EXAM: CHEST  2 VIEW  COMPARISON:  05/14/2011  FINDINGS: Prior valve replacement. Heart and mediastinal contours are within normal limits. No focal opacities or effusions. No acute bony abnormality.  IMPRESSION: Heart and  mediastinal contours are within normal limits. No focal opacities or effusions. No acute bony abnormality. No active cardiopulmonary disease.   Electronically Signed   By: Rolm Baptise M.D.   On: 06/01/2014 09:14     IMPRESSION:   *  Microcytic anemia.  S/p 3 PRBCs.  FOBT negative.  The only ROS detail of importance is the epistaxis x 2, otherwise recent GI hx is benign.  However with the low MCV it suggests more chronic anemia. TSH is normal.   *  Slightly elevated AST and T bili.  Hx of mild transaminase elevation in 2009.  On older CT and ultrsound, no liver/GB/ductal disease.  Pt is a tea-totaler but remote hx of substance use.    *  S/p 2009 mitral valve repair. Hx endocarditis.     PLAN:     *  Per Dr Hilarie Fredrickson.  ? EGD/enteroscopy.   *  As he has remote 1980s hx of  substance abuse, will screen for Hep C and B (HIV negative in 2009).   Azucena Freed  06/01/2014, 12:07 PM Pager: [phone]

## 2014-06-01 NOTE — Plan of Care (Signed)
Problem: Phase I Progression Outcomes Goal: Initial discharge plan identified Outcome: Completed/Met Date Met:  06/01/14 To return home with wife

## 2014-06-01 NOTE — ED Provider Notes (Signed)
CSN: [id number]     Arrival date & time 05/31/14  1735 History   First MD Initiated Contact with Patient 05/31/14 1838     Chief Complaint  Patient presents with  . Abnormal Lab     (Consider location/radiation/quality/duration/timing/severity/associated sxs/prior Treatment) HPI Comments: Patient sent to the ED from PCPs office for symptomatically anemia.  He's had several weeks of fatigue.  He denies chest pain, shortness of breath, palpitations.  He has had several episodes of near syncope. NO fevers, chills, weight changes.   The history is provided by the patient. No language interpreter was used.    Past Medical History  Diagnosis Date  . GERD (gastroesophageal reflux disease)   . Hyperlipidemia   . Endocarditis 10-1999     RECURRENCE 02-2008  . History of chest tube placement 1980    R SIDE FOR SPONTANEOUS  PNEUMOTHORAX  . Periodontitis     PMH  . C. difficile colitis 2009  . DVT prophylaxis     Dr Ricard Dillon , post MV repair  . External hemorrhoids without mention of complication 63/04/6008    Colonoscopy   Past Surgical History  Procedure Laterality Date  . Colonoscopy  2005    rectal bleeding; negative exam;  GI  . Dental extractions      DENTAL INFECTION  . Thoracotomy  2009    MITRAL VAVLE REPAIR  . Prostate surgery     Family History  Problem Relation Age of Onset  . Ovarian cancer Sister   . Diabetes Neg Hx   . Heart disease Neg Hx   . Stroke Neg Hx    History  Substance Use Topics  . Smoking status: Former Smoker    Quit date: 08/08/1989  . Smokeless tobacco: Former Systems developer    Quit date: 08/08/1989  . Alcohol Use: No    Review of Systems  Constitutional: Positive for fatigue. Negative for fever, activity change and appetite change.  HENT: Negative for congestion, facial swelling, rhinorrhea and trouble swallowing.   Eyes: Negative for photophobia and pain.  Respiratory: Negative for cough, chest tightness and shortness of breath.    Cardiovascular: Negative for chest pain and leg swelling.  Gastrointestinal: Negative for nausea, vomiting, abdominal pain, diarrhea and constipation.  Endocrine: Negative for polydipsia and polyuria.  Genitourinary: Negative for dysuria, urgency, decreased urine volume and difficulty urinating.  Musculoskeletal: Negative for back pain and gait problem.  Skin: Negative for color change, rash and wound.  Allergic/Immunologic: Negative for immunocompromised state.  Neurological: Positive for light-headedness. Negative for dizziness, facial asymmetry, speech difficulty, weakness, numbness and headaches.  Psychiatric/Behavioral: Negative for confusion, decreased concentration and agitation.      Allergies  Clarithromycin  Home Medications   Prior to Admission medications   Medication Sig Start Date End Date Taking? Authorizing Provider  aspirin EC 325 MG tablet Take 325 mg by mouth daily.   Yes Historical Provider, MD  KRILL OIL PO Take 1 tablet by mouth daily.    Yes Historical Provider, MD  loratadine (CLARITIN) 10 MG tablet Take 1 tablet (10 mg total) by mouth daily. 03/22/14  Yes Alvina Chou, PA-C  Multiple Vitamin (MULTIVITAMIN WITH MINERALS) TABS tablet Take 1 tablet by mouth daily.   Yes Historical Provider, MD  ranitidine (ZANTAC) 150 MG tablet Take 1 tablet (150 mg total) by mouth 2 (two) times daily. 09/30/12  Yes Hendricks Limes, MD  tadalafil (CIALIS) 20 MG tablet 1/2- 1 every 3 days as needed Patient taking differently: Take 10 mg  by mouth daily as needed for erectile dysfunction.  11/03/13  Yes Hendricks Limes, MD   BP 114/71 mmHg  Pulse 89  Temp(Src) 99 F (37.2 C) (Oral)  Resp 20  Ht 5\' 11"  (1.803 m)  Wt 150 lb 8 oz (68.266 kg)  BMI 21.00 kg/m2  SpO2 100% Physical Exam  Constitutional: He is oriented to person, place, and time. He appears well-developed and well-nourished. No distress.  HENT:  Head: Normocephalic and atraumatic.  Mouth/Throat: No  oropharyngeal exudate.  Eyes: Pupils are equal, round, and reactive to light.  Neck: Normal range of motion. Neck supple.  Cardiovascular: Normal rate, regular rhythm and normal heart sounds.  Exam reveals no gallop and no friction rub.   No murmur heard. Pulmonary/Chest: Effort normal and breath sounds normal. No respiratory distress. He has no wheezes. He has no rales.  Abdominal: Soft. Bowel sounds are normal. He exhibits no distension and no mass. There is no tenderness. There is no rebound and no guarding.  Genitourinary: Guaiac negative stool.  Musculoskeletal: Normal range of motion. He exhibits no edema or tenderness.  Neurological: He is alert and oriented to person, place, and time.  Skin: Skin is warm and dry.  Psychiatric: He has a normal mood and affect.    ED Course  Procedures (including critical care time) Labs Review Labs Reviewed  COMPREHENSIVE METABOLIC PANEL - Abnormal; Notable for the following:    Glucose, Bld 106 (*)    AST 89 (*)    Total Bilirubin 1.6 (*)    GFR calc non Af Amer 81 (*)    All other components within normal limits  CBC WITH DIFFERENTIAL/PLATELET - Abnormal; Notable for the following:    WBC 3.8 (*)    RBC 2.90 (*)    Hemoglobin 5.9 (*)    HCT 20.7 (*)    MCV 71.4 (*)    MCH 20.3 (*)    MCHC 28.5 (*)    All other components within normal limits  URINALYSIS, ROUTINE W REFLEX MICROSCOPIC - Abnormal; Notable for the following:    APPearance CLOUDY (*)    Hgb urine dipstick SMALL (*)    All other components within normal limits  TROPONIN I  URINE MICROSCOPIC-ADD ON  TROPONIN I  TROPONIN I  OCCULT BLOOD X 1 CARD TO LAB, STOOL  CBC  COMPREHENSIVE METABOLIC PANEL  TSH  HEMOGLOBIN A1C  POC OCCULT BLOOD, ED  TYPE AND SCREEN  PREPARE RBC (CROSSMATCH)  PREPARE RBC (CROSSMATCH)    Imaging Review No results found.   EKG Interpretation None      MDM   Final diagnoses:  Symptomatic anemia    Pt is a 54 y.o. male with Pmhx as  above who presents with  From PCPs office with symptomatic anemia.  He has had generalized weakness and fatigue for 3 weeks with rare episodes of near syncope.  Denies chest pain, abdominal pain, palpitations, shortness of breath, leg pain or swelling.  He seen no blood in his stool.  Urine gums.  No significant nosebleeds.  Hemoglobin 5.9.  We'll plan on transfusion and admission.  I'm unsure of the cause of the anemia at this point   Ernestina Patches, MD 06/01/14 [phone]

## 2014-06-01 NOTE — Progress Notes (Signed)
Pt received 2UPRBC, no adverse reaction noted. We will continue to monitor.

## 2014-06-02 ENCOUNTER — Encounter (HOSPITAL_COMMUNITY)
Admission: EM | Disposition: A | Discharge status: Home / Self Care | Payer: Self-pay | Source: Home / Self Care | Attending: Internal Medicine

## 2014-06-02 ENCOUNTER — Encounter (HOSPITAL_COMMUNITY): Payer: Self-pay | Admitting: *Deleted

## 2014-06-02 ENCOUNTER — Other Ambulatory Visit: Payer: Self-pay

## 2014-06-02 DIAGNOSIS — D6489 Other specified anemias: Secondary | ICD-10-CM

## 2014-06-02 DIAGNOSIS — D509 Iron deficiency anemia, unspecified: Secondary | ICD-10-CM | POA: Insufficient documentation

## 2014-06-02 HISTORY — PX: ESOPHAGOGASTRODUODENOSCOPY: SHX5428

## 2014-06-02 LAB — TYPE AND SCREEN
ABO/RH(D): O POS
Antibody Screen: NEGATIVE
Unit division: 0
Unit division: 0
Unit division: 0

## 2014-06-02 LAB — CBC
HCT: 30.5 % — ABNORMAL LOW (ref 39.0–52.0)
Hemoglobin: 9.4 g/dL — ABNORMAL LOW (ref 13.0–17.0)
MCH: 23.2 pg — ABNORMAL LOW (ref 26.0–34.0)
MCHC: 30.8 g/dL (ref 30.0–36.0)
MCV: 75.1 fL — ABNORMAL LOW (ref 78.0–100.0)
Platelets: 194 10*3/uL (ref 150–400)
RBC: 4.06 MIL/uL — ABNORMAL LOW (ref 4.22–5.81)
RDW: 32.1 % — ABNORMAL HIGH (ref 11.5–15.5)
WBC: 3.8 10*3/uL — ABNORMAL LOW (ref 4.0–10.5)

## 2014-06-02 LAB — BASIC METABOLIC PANEL
Anion gap: 6 (ref 5–15)
BUN: 8 mg/dL (ref 6–23)
CO2: 25 mmol/L (ref 19–32)
Calcium: 9.2 mg/dL (ref 8.4–10.5)
Chloride: 108 mmol/L (ref 96–112)
Creatinine, Ser: 0.9 mg/dL (ref 0.50–1.35)
GFR calc Af Amer: >90 mL/min (ref >90)
GFR calc non Af Amer: >90 mL/min (ref >90)
Glucose, Bld: 86 mg/dL (ref 70–99)
Potassium: 4.1 mmol/L (ref 3.5–5.1)
Sodium: 139 mmol/L (ref 135–145)

## 2014-06-02 LAB — HEMOGLOBIN A1C
Hgb A1c MFr Bld: 5.1 % (ref 4.8–5.6)
Mean Plasma Glucose: 100 mg/dL

## 2014-06-02 SURGERY — EGD (ESOPHAGOGASTRODUODENOSCOPY)
Anesthesia: Moderate Sedation

## 2014-06-02 MED ORDER — FENTANYL CITRATE 0.05 MG/ML IJ SOLN
INTRAMUSCULAR | Status: DC | PRN
  Administered 2014-06-02 (×2): 25 ug via INTRAVENOUS

## 2014-06-02 MED ORDER — FENTANYL CITRATE 0.05 MG/ML IJ SOLN
INTRAMUSCULAR | Status: AC
  Filled 2014-06-02: qty 2

## 2014-06-02 MED ORDER — MIDAZOLAM HCL 5 MG/ML IJ SOLN
INTRAMUSCULAR | Status: AC
  Filled 2014-06-02: qty 2

## 2014-06-02 MED ORDER — BUTAMBEN-TETRACAINE-BENZOCAINE 2-2-14 % EX AERO
INHALATION_SPRAY | CUTANEOUS | Status: DC | PRN
  Administered 2014-06-02: 2 via TOPICAL

## 2014-06-02 MED ORDER — MIDAZOLAM HCL 10 MG/2ML IJ SOLN
INTRAMUSCULAR | Status: DC | PRN
  Administered 2014-06-02 (×4): 2 mg via INTRAVENOUS

## 2014-06-02 MED ORDER — PANTOPRAZOLE SODIUM 40 MG PO TBEC: 40.0000 mg | DELAYED_RELEASE_TABLET | Freq: Every day | ORAL | Status: DC

## 2014-06-02 MED ORDER — SODIUM CHLORIDE 0.9 % IV SOLN: INTRAVENOUS | Status: DC

## 2014-06-02 NOTE — Progress Notes (Signed)
06/02/14 Patient to be discharged this afternoon home. IV site removed and discharge instructions reviewed with patient.

## 2014-06-02 NOTE — Discharge Summary (Signed)
Physician Discharge Summary  David [name] CWU:[id number] DOB: 01-Dec-1960 DOA: 05/31/2014  PCP: Unice Cobble, MD  Admit date: 05/31/2014 Discharge date: 06/02/2014  Time spent: 40 minutes  Recommendations for Outpatient Follow-up:  1. Follow-up with primary care physician and gastroenterology within 1 week. 2. Capsule endoscopy to be done as outpatient, if negative patient needs Hem/Onc referral for anemia evaluation.  Discharge Diagnoses:  Active Problems:   GERD   Anemia   Hyperlipidemia   Symptomatic anemia   Microcytic anemia   Discharge Condition: Stable  Diet recommendation: Heart healthy  Filed Weights   05/31/14 2338  Weight: 68.266 kg (150 lb 8 oz)    History of present illness:  David [name] is a 54 y.o. male presents with fatigue and SOB. He has noted that he has been tired for a couple of weeks. He gets SOB with exertion. He states he has been feeling light headed and also dizzy. He did not actually pass out. He has noted cramping in his legs also. He has no chest pain noted. He has had a high heart rate when he exerts himself. He was seen at his PCP today and was noted to be severely anemic. Patient is being admitted for further workup. He states his stools have been regular looking. He does related nose bleed last week for about an hour. In the ED his Hgb is noted to 5.9 at this time.  Hospital Course:    Sympomatic anemia -Patient admitted with hemoglobin of 5.9, baseline hemoglobin normal. -Status post transfusion of 2 units of packed RBCs, hemoglobin is 9.0. -Patient is on IV Protonix twice a day, has history of GERD but denies any hematemesis. -Gastroenterology consulted, EGD was done earlier today and showed no evidence of bleeding. Recommended outpatient capsule endoscopy, if negative needs referral to Hem/Onc for evaluation of his anemia.  GERD -Was on Zantac, EGD did not show gastritis. -Discontinued aspirin on discharge, started on Protonix  daily.  Hyperlipidemia -Monitor labs   Procedures:  EGD done earlier today showed normal esophageal and duodenal mucosa, evidence of mild gastropathy in the gastric antrum multiple biopsies obtained. No source of bleeding seen endoscopically.  Consultations:  [facility], Dr. Hilarie Fredrickson  Discharge Exam: Filed Vitals:   06/02/14 0830  BP: 98/61  Pulse: 70  Temp:   Resp: 15   General: Alert and awake, oriented x3, not in any acute distress. HEENT: anicteric sclera, pupils reactive to light and accommodation, EOMI CVS: S1-S2 clear, no murmur rubs or gallops Chest: clear to auscultation bilaterally, no wheezing, rales or rhonchi Abdomen: soft nontender, nondistended, normal bowel sounds, no organomegaly Extremities: no cyanosis, clubbing or edema noted bilaterally Neuro: Cranial nerves II-XII intact, no focal neurological deficits   Discharge Instructions   Discharge Instructions    Diet - low sodium heart healthy    Complete by:  As directed      Increase activity slowly    Complete by:  As directed           Current Discharge Medication List    START taking these medications   Details  pantoprazole (PROTONIX) 40 MG tablet Take 1 tablet (40 mg total) by mouth daily. Qty: 30 tablet, Refills: 0      CONTINUE these medications which have NOT CHANGED   Details  KRILL OIL PO Take 1 tablet by mouth daily.     loratadine (CLARITIN) 10 MG tablet Take 1 tablet (10 mg total) by mouth daily. Qty: 30 tablet, Refills: 3  Multiple Vitamin (MULTIVITAMIN WITH MINERALS) TABS tablet Take 1 tablet by mouth daily.    tadalafil (CIALIS) 20 MG tablet 1/2- 1 every 3 days as needed Qty: 5 tablet, Refills: 11      STOP taking these medications     aspirin EC 325 MG tablet      ranitidine (ZANTAC) 150 MG tablet        Allergies  Allergen Reactions  . Clarithromycin     REACTION: NAUSEA   Follow-up Information    Follow up with Unice Cobble, MD In 1 week.    Specialty:  Internal Medicine   Contact information:   [address] [place] [address] [phone]       Follow up with Jerene Bears, MD In 1 week.   Specialty:  Gastroenterology   Contact information:   [address] [place] [address] [phone]        The results of significant diagnostics from this hospitalization (including imaging, microbiology, ancillary and laboratory) are listed below for reference.    Significant Diagnostic Studies: Dg Chest 2 View  06/01/2014   CLINICAL DATA:  Shortness of breath with exertion for 1 month.  EXAM: CHEST  2 VIEW  COMPARISON:  05/14/2011  FINDINGS: Prior valve replacement. Heart and mediastinal contours are within normal limits. No focal opacities or effusions. No acute bony abnormality.  IMPRESSION: Heart and mediastinal contours are within normal limits. No focal opacities or effusions. No acute bony abnormality. No active cardiopulmonary disease.   Electronically Signed   By: Rolm Baptise M.D.   On: 06/01/2014 09:14    Microbiology: No results found for this or any previous visit (from the past 240 hour(s)).   Labs: Basic Metabolic Panel:  Recent Labs Lab 05/31/14 1621 05/31/14 1751 06/01/14 0800 06/02/14 0930  NA 138 136 140 139  K 4.0 3.5 4.1 4.1  CL 107 105 106 108  CO2 24 25 29 25   GLUCOSE 95 106* 116* 86  BUN 11 9 6 8   CREATININE 0.97 1.02 1.08 0.90  CALCIUM 9.2 9.0 9.1 9.2   Liver Function Tests:  Recent Labs Lab 05/31/14 1621 05/31/14 1751 06/01/14 0800  AST 79* 89* 70*  ALT 29 31 29   ALKPHOS 44 47 42  BILITOT 1.6* 1.6* 1.9*  PROT 6.9 6.7 6.6  ALBUMIN 4.4 4.4 3.7   No results for input(s): LIPASE, AMYLASE in the last 168 hours. No results for input(s): AMMONIA in the last 168 hours. CBC:  Recent Labs Lab 05/31/14 1621 05/31/14 1751 06/01/14 0800 06/02/14 0930  WBC 4.3 3.8* 4.2 3.8*  NEUTROABS 2.3 1.9  --   --   HGB 5.9 cL* 5.9* 9.0* 9.4*  HCT 18.7* 20.7* 28.7* 30.5*  MCV 70.6*  71.4* 75.9* 75.1*  PLT 174.0 226 185 PENDING   Cardiac Enzymes:  Recent Labs Lab 05/31/14 1950 06/01/14 0800 06/01/14 1130  TROPONINI <0.03 <0.03 <0.03   BNP: BNP (last 3 results) No results for input(s): BNP in the last 8760 hours.  ProBNP (last 3 results) No results for input(s): PROBNP in the last 8760 hours.  CBG:  Recent Labs Lab 06/01/14 0812  GLUCAP 151*       Signed:  Ellan Tess A  [facility] 06/02/2014, 12:07 PM

## 2014-06-02 NOTE — Op Note (Signed)
[facility] [place], [address]   ENDOSCOPY PROCEDURE REPORT  PATIENT: [name], David  MR#: [id number] BIRTHDATE: [birth year] , 46  yrs. old GENDER: male ENDOSCOPIST: Jerene Bears, MD REFERRED BY:  [facility] PROCEDURE DATE:  06/02/2014 PROCEDURE:  EGD w/ biopsy ASA CLASS:     Class II INDICATIONS:  iron deficiency anemia. MEDICATIONS: Fentanyl 50 mcg IV and Versed 10 mg IV TOPICAL ANESTHETIC: Cetacaine Spray  DESCRIPTION OF PROCEDURE: After the risks benefits and alternatives of the procedure were thoroughly explained, informed consent was obtained.  The PENTAX GASTOROSCOPE [id number] endoscope was introduced through the mouth and advanced to the second portion of the duodenum , Without limitations.  The instrument was slowly withdrawn as the mucosa was fully examined.  ESOPHAGUS: The mucosa of the esophagus appeared normal.   Z-line regular.  STOMACH: Very mild gastropathy was found in the gastric antrum. Cold forcep biopsies were taken at the gastric body, antrum and angularis to evaluate for h.  pylori.   The stomach otherwise appeared normal.  DUODENUM: The duodenal mucosa showed no abnormalities in the bulb and 2nd part of the duodenum.  Cold forcep biopsies were taken in the second portion.  Retroflexed views revealed a small hiatal hernia.     The scope was then withdrawn from the patient and the procedure completed.  COMPLICATIONS: There were no immediate complications.  ENDOSCOPIC IMPRESSION: 1.   The mucosa of the esophagus appeared normal 2.   Very mild gastropathy was found in the gastric antrum, multiple biopsies 3.   The stomach otherwise appeared normal 4.   The duodenal mucosa showed no abnormalities in the bulb and 2nd part of the duodenum cold forcep biopsies were taken in the second portion 5.    No source for anemia seen endoscopically today  RECOMMENDATIONS: 1.  Await biopsy results 2.  Monitor Hgb  close, replace iron as needed 3.  Outpatient hematology referral given heme negative anemia and marked schistocytes seen on smear. 4.  Outpatient video capsule endoscopy given profound microcytic anemia  eSigned:  Jerene Bears, MD 06/02/2014 8:13 AM      CC:The Patient  PATIENT NAME:  [name], David MR#: [id number]

## 2014-06-03 ENCOUNTER — Encounter: Payer: Self-pay | Admitting: Internal Medicine

## 2014-06-03 ENCOUNTER — Encounter (HOSPITAL_COMMUNITY): Payer: Self-pay | Admitting: Internal Medicine

## 2014-06-03 ENCOUNTER — Other Ambulatory Visit: Payer: Self-pay

## 2014-06-03 DIAGNOSIS — A048 Other specified bacterial intestinal infections: Secondary | ICD-10-CM

## 2014-06-03 LAB — HEPATITIS B SURFACE ANTIGEN: Hepatitis B Surface Ag: NEGATIVE

## 2014-06-03 LAB — IGA: IgA: 155 mg/dL (ref 68–379)

## 2014-06-03 LAB — HEPATITIS C ANTIBODY: HCV Ab: NEGATIVE

## 2014-06-03 LAB — HEPATITIS B CORE ANTIBODY, IGM: Hep B C IgM: NONREACTIVE

## 2014-06-03 MED ORDER — BIS SUBCIT-METRONID-TETRACYC 140-125-125 MG PO CAPS: 3.0000 | ORAL_CAPSULE | Freq: Three times a day (TID) | ORAL | Status: DC

## 2014-06-06 ENCOUNTER — Telehealth: Payer: Self-pay | Admitting: Internal Medicine

## 2014-06-06 ENCOUNTER — Telehealth: Payer: Self-pay | Admitting: Hematology

## 2014-06-06 ENCOUNTER — Telehealth: Payer: Self-pay | Admitting: *Deleted

## 2014-06-06 NOTE — Telephone Encounter (Signed)
Patient Name: David [name] DOB: 09/20/1960 Initial Comment caller states he had a nose bleed early saturday am - he had a low hgb and went to the ER to get a blood transfusion. His nosebleed lasted several hours - he would like to speak with the nurse re this. He does not have a nosebleed today Nurse Assessment Nurse: Marcelline Deist, RN, Lynda Date/Time (Eastern Time): 06/06/2014 11:11:14 AM Confirm and document reason for call. If symptomatic, describe symptoms. ---Caller states he had a nose bleed early Sunday am, he had a low Hgb and went to the ER to get a blood transfusion on the Tuesday before. His nosebleed lasted several hours, When seen in the ER last week, was admitted & performed an endoscopy, showed some bad bacteria (H Pylori) in stomach. Given rx , antibiotics. He would like to speak with the nurse re this. He does not have a nosebleed today. Has the patient traveled out of the country within the last 30 days? ---Not Applicable Does the patient require triage? ---Yes Related visit to physician within the last 2 weeks? ---Yes Does the PT have any chronic conditions? (i.e. diabetes, asthma, etc.) ---Yes List chronic conditions. ---anemic Guidelines Guideline Title Affirmed Question Affirmed Notes Nosebleed [1] Mild-moderate nosebleed AND [2] bleeding stopped now (all triage questions negative) Final Disposition [place], RN, [place]

## 2014-06-06 NOTE — Telephone Encounter (Signed)
[facility] - Client [facility] Patient Name: David [name] Gender: Male DOB: 11-Nov-1960 Age: 54 Y 75 M 10 D Return Phone Number: [phone] (Primary), [phone] (Secondary) Address: [address] City/State/Zip: [place] [address] Client  Primary [facility] - Client Client Site [facility] - Day Physician [place], [place] Type Call Call Type Triage / Clinical Relationship To Patient Self Appointment Disposition EMR Patient Reports Appointment Already Scheduled Return Phone Number [phone] (Primary) Chief Complaint Health information question (non symptomatic) Initial Comment caller states he had a nose bleed early saturday am - he had a low hgb and went to the ER to get a blood transfusion. His nosebleed lasted several hours - he would like to speak with the nurse re this. He does not have a nosebleed today PreDisposition Call Doctor Info pasted into Epic Yes Nurse Assessment Nurse: Marcelline Deist, RN, Kermit Balo Date/Time (Eastern Time): 06/06/2014 11:11:14 AM Confirm and document reason for call. If symptomatic, describe symptoms. ---Caller states he had a nose bleed early Sunday am, he had a low Hgb and went to the ER to get a blood transfusion on the Tuesday before. His nosebleed lasted several hours, When seen in the ER last week, was admitted & performed an endoscopy, showed some bad bacteria (H Pylori) in stomach. Given rx , antibiotics. He would like to speak with the nurse re this. He does not have a nosebleed today. Has the patient traveled out of the country within the last 30 days? ---Not Applicable Does the patient require triage? ---Yes Related visit to physician within the last 2 weeks? ---Yes Does the PT have any chronic conditions? (i.e. diabetes, asthma, etc.) ---Yes List chronic conditions. ---anemic Guidelines Guideline Title Affirmed Question Affirmed Notes  Nurse Date/Time (Eastern Time) Nosebleed [1] Mild-moderate nosebleed AND [2] bleeding stopped now (all triage questions negative) Marcelline Deist, RN, Kermit Balo 06/06/2014 11:16:38 AM Disp. Time Eilene Ghazi Time) Disposition Final User PLEASE NOTE: All timestamps contained within this report are represented as [place] Standard Time. CONFIDENTIALTY NOTICE: This fax transmission is intended only for the addressee. It contains information that is legally privileged, confidential or otherwise protected from use or disclosure. If you are not the intended recipient, you are strictly prohibited from reviewing, disclosing, copying using or disseminating any of this information or taking any action in reliance on or regarding this information. If you have received this fax in error, please notify us immediately by telephone so that we can arrange for its return to [place]. Phone: [phone], Toll-Free: [phone], Fax: [phone] Page: 2 of 2 Call Id: [id number] 06/06/2014 11:24:00 AM [place], RN, Milana Kidney Understands: Yes Disagree/Comply: Comply Care Advice Given Per Guideline HOME CARE: You should be able to treat this at home. REASSURANCE: * Nosebleeds are common. * They can re-bleed if you rub your nose or blow the nose too hard. * Let's review how to stop the next one. TREATING A NOSEBLEED - [place] THE NOSTRILS: * First blow the nose to clear out any large clots. * Sit down and lean forward. (Reason: blood makes people choke if they lean backwards). * Gently squeeze the soft parts of the lower nose (nostrils) together. Use your thumb and your index finger in a pinching manner. Do this for 15 minutes. Use a clock or watch to measure the time. (Goal: apply continuous pressure to the bleeding point.) TREATING A NOSEBLEED - INSERTING A GAUZE WITH DECONGESTANT NOSE DROPS: * If applying pressure  fails, insert a gauze wet with decongestant nose drops (or petroleum jelly). (Reason: the gauze helps to  apply pressure and the nose drops shrink the blood vessels) PREVENT RECURRENT NOSEBLEEDS: * Dry air in your house or workplace can increase the chance of nosebleeds occurring. If the air is dry, use a humidifier in your bedroom to keep the nose from drying out. You can also apply petroleum jelly to the center wall (septum) inside the nose twice daily to reduce cracking and to promote healing. EXPECTED COURSE: CALL BACK IF: CARE ADVICE given per Nosebleed (Adult) guideline. * You become worse. * Nosebleeds become worse * After swallowing blood from a nosebleed, you may feel nauseated because the blood can irritate your stomach. You may also pass a dark stool tomorrow. * Example decongestant medication: Afrin (oxymetazoline) nasal spray is available over-the-counter and is a nasal decongestant. After Care Instructions Given Call Event Type User Date / Time Description

## 2014-06-06 NOTE — Telephone Encounter (Signed)
Spoke with patient and he states he had a nose bleed on Sunday AM and he had dark blood in his stool last PM. He states he is also suppose to schedule an OV in one week. Scheduled with Lori Hvozdovic, PA-C on 06/07/14 at 1:30 PM.

## 2014-06-06 NOTE — Telephone Encounter (Signed)
David [name] will you schedule patient tomorrow

## 2014-06-06 NOTE — Telephone Encounter (Signed)
With the nose bleed; it is most important to have follow up with a Hematologist. I see referral was made. Call [facility] @ [phone] & make F/U appt 2/9

## 2014-06-06 NOTE — Telephone Encounter (Signed)
Pt confirmed appt on 06/30/14 at 10:30am [facility] Dx: anemia Referring Drs [facility]- LB Gertie Fey

## 2014-06-07 ENCOUNTER — Other Ambulatory Visit (INDEPENDENT_AMBULATORY_CARE_PROVIDER_SITE_OTHER): Payer: 59

## 2014-06-07 ENCOUNTER — Encounter: Payer: Self-pay | Admitting: Physician Assistant

## 2014-06-07 ENCOUNTER — Encounter: Payer: Self-pay | Admitting: Internal Medicine

## 2014-06-07 ENCOUNTER — Telehealth: Payer: Self-pay | Admitting: *Deleted

## 2014-06-07 ENCOUNTER — Ambulatory Visit (INDEPENDENT_AMBULATORY_CARE_PROVIDER_SITE_OTHER): Payer: 59 | Admitting: Internal Medicine

## 2014-06-07 ENCOUNTER — Ambulatory Visit (INDEPENDENT_AMBULATORY_CARE_PROVIDER_SITE_OTHER): Payer: 59 | Admitting: Physician Assistant

## 2014-06-07 VITALS — BP 110/70 | HR 80 | Ht 70.0 in | Wt 158.0 lb

## 2014-06-07 VITALS — BP 134/96 | HR 89 | Temp 98.6°F | Ht 71.0 in | Wt 158.0 lb

## 2014-06-07 DIAGNOSIS — D509 Iron deficiency anemia, unspecified: Secondary | ICD-10-CM

## 2014-06-07 DIAGNOSIS — C61 Malignant neoplasm of prostate: Secondary | ICD-10-CM

## 2014-06-07 DIAGNOSIS — R312 Other microscopic hematuria: Secondary | ICD-10-CM

## 2014-06-07 DIAGNOSIS — R3129 Other microscopic hematuria: Secondary | ICD-10-CM

## 2014-06-07 LAB — CBC WITH DIFFERENTIAL/PLATELET
Basophils Absolute: 0 10*3/uL (ref 0.0–0.1)
Basophils Relative: 0.2 % (ref 0.0–3.0)
Eosinophils Absolute: 0 10*3/uL (ref 0.0–0.7)
Eosinophils Relative: 0.2 % (ref 0.0–5.0)
HCT: 26 % — ABNORMAL LOW (ref 39.0–52.0)
Hemoglobin: 8.7 g/dL — ABNORMAL LOW (ref 13.0–17.0)
Lymphocytes Relative: 37.2 % (ref 12.0–46.0)
Lymphs Abs: 2 10*3/uL (ref 0.7–4.0)
MCHC: 33.6 g/dL (ref 30.0–36.0)
MCV: 75 fl — ABNORMAL LOW (ref 78.0–100.0)
Monocytes Absolute: 0.4 10*3/uL (ref 0.1–1.0)
Monocytes Relative: 7.7 % (ref 3.0–12.0)
Neutro Abs: 2.9 10*3/uL (ref 1.4–7.7)
Neutrophils Relative %: 54.7 % (ref 43.0–77.0)
Platelets: 160 10*3/uL (ref 150.0–400.0)
RBC: 3.46 Mil/uL — ABNORMAL LOW (ref 4.22–5.81)
RDW: 37 % — ABNORMAL HIGH (ref 11.5–15.5)
WBC: 5.4 10*3/uL (ref 4.0–10.5)

## 2014-06-07 LAB — URINALYSIS, ROUTINE W REFLEX MICROSCOPIC
Bilirubin Urine: NEGATIVE
Ketones, ur: NEGATIVE
Leukocytes, UA: NEGATIVE
Nitrite: NEGATIVE
Specific Gravity, Urine: 1.015 (ref 1.000–1.030)
Total Protein, Urine: 30 — AB
Urine Glucose: NEGATIVE
Urobilinogen, UA: 0.2 (ref 0.0–1.0)
WBC, UA: NONE SEEN (ref 0–?)
pH: 6 (ref 5.0–8.0)

## 2014-06-07 LAB — IBC PANEL
Iron: 124 ug/dL (ref 42–165)
Saturation Ratios: 29.9 % (ref 20.0–50.0)
Transferrin: 296 mg/dL (ref 212.0–360.0)

## 2014-06-07 LAB — FERRITIN: Ferritin: 24 ng/mL (ref 22.0–322.0)

## 2014-06-07 LAB — HEPATITIS B SURFACE ANTIBODY,QUALITATIVE: Hep B S Ab: NEGATIVE

## 2014-06-07 NOTE — Progress Notes (Signed)
   Subjective:    Patient ID: David [name], male    DOB: [birth year], 54 y.o.   MRN: [id number]  HPI He was hospitalized with dramatic, symptomatic anemia; Hgb was 5.9 with MVC of 70.  After 4 units of packed cells his hemoglobin was 9.4  The etiology of his microcytic anemia remains an enigma. The upper endoscopy 2/4 simply revealed gastropathy. A capsule endoscopy is pending.  He did experience self-limited epistaxis 06/05/14. Following this he had a stool which was darker.  2/8 urine was brown with concern for hematuria.  Prostate biopsy done 04/16/14 did reveal Gleason score 6 prostate cancer. This is being monitored with  Surveillance by Dr Janice Norrie.  There is no personal or family history of bleeding dyscrasias or hemoglobinopathy.  Review of Systems He denies dysuria, pyuria, fever, chills, sweats, rashes, or weight loss    Objective:   Physical Exam  Positive or pertinent findings: He is thin but adequately nourished; he is in no distress. Upper plate  There is accentuation of the first heart sound; there is no significant murmur. He has no cervical or axillary lymphadenopathy. There is no hepatomegaly or splenomegaly present.   Eyes: No conjunctival inflammation or scleral icterus is present. Oral exam: Lips and gums are healthy appearing.There is no oropharyngeal erythema or exudate noted.  Heart:  Normal rate and regular rhythm. S1 and S2 normal without gallop, click, rub or other extra sounds   Lungs:Chest clear to auscultation; no wheezes, rhonchi,rales ,or rubs present.No increased work of breathing.  Abdomen: bowel sounds normal, soft and non-tender without masses, organomegaly or hernias noted.  No guarding or rebound.  Vascular : all pulses equal ; no bruits present. Skin:Warm & dry.  Intact without suspicious lesions or rashes ; no tenting Neuro: Strength, tone & DTRs normal.         Assessment & Plan:  #1 microcytic anemia; small bowel etiology must be  ruled out  #2 prostate cancer; active surveillance bing pursued  Plan: Most important will be Hematology consultation if the capsule endoscopy is not diagnostic.

## 2014-06-07 NOTE — Progress Notes (Addendum)
Patient ID: David [name], male   DOB: 1960/12/04, 54 y.o.   MRN: [id number]   Subjective:    Patient ID: David [name], male    DOB: [birth year], 54 y.o.   MRN: [id number]  HPI David [name] is a pleasant 54 year old African-American male known to Dr.Pyrtle and seen in consultation during recent hospitalization at [facility] last week. Patient has history of endocarditis in 2001 and again in 2009 and is status post mitral valve repair 2009. He has history of chronic GERD. He had undergone prior colonoscopy for screening in 2013 with Dr. Sharlett Iles and this was a normal exam. Patient was hospitalized on 05/31/2014 with weakness and tachycardia and found to have a hemoglobin of 5.9 MCV of 70. He was Hemoccult negative. He had not been on any aspirin or NSAIDs and no blood thinners. He was seen in consultation by GI and underwent upper endoscopy with Dr.Pyrtle on 06/02/2014 which showed a mild gastropathy. He required 4 units of packed RBCs and at the time of discharge had a hemoglobin of 9.4. There were no iron studies done during his hospitalization. He was noted to have schistocytes on his peripheral smear and is to be evaluated by hematology outpatient on March 3. Patient says he still feels fatigued but overall much better than when he was admitted to the hospital. He has had a couple of mild nosebleeds over the past week. He had a more significant nose bleed back in November. What he describes to me is small  volume and self-limited. He has no GI complaints, no abdominal pain cramping nausea heartburn indigestion. No melena or hematochezia noted.  Review of Systems Pertinent positive and negative review of systems were noted in the above HPI section.  All other review of systems was otherwise negative.   Outpatient Encounter Prescriptions as of 06/07/2014  Medication Sig  . bismuth-metronidazole-tetracycline (PYLERA) 140-125-125 MG per capsule Take 3 capsules by mouth 4 (four) times daily -  before meals  and at bedtime.  [name] KRILL OIL PO Take 1 tablet by mouth daily.   [name] loratadine (CLARITIN) 10 MG tablet Take 1 tablet (10 mg total) by mouth daily.  . Multiple Vitamin (MULTIVITAMIN WITH MINERALS) TABS tablet Take 1 tablet by mouth daily.  . pantoprazole (PROTONIX) 40 MG tablet Take 1 tablet (40 mg total) by mouth daily.  . tadalafil (CIALIS) 20 MG tablet 1/2- 1 every 3 days as needed (Patient taking differently: Take 10 mg by mouth daily as needed for erectile dysfunction. )   Allergies  Allergen Reactions  . Clarithromycin     REACTION: NAUSEA   Patient Active Problem List   Diagnosis Date Noted  . Microcytic anemia   . Anemia 05/31/2014  . Hyperlipidemia 05/31/2014  . Symptomatic anemia 05/31/2014  . Microscopic hematuria 02/10/2014  . Nonspecific abnormal electrocardiogram (ECG) (EKG) 11/19/2011  . HYPERLIPIDEMIA 01/12/2010  . GERD 01/12/2010  . HIATAL HERNIA 05/12/2009  . Mitral valve disorder 06/29/2008  . UNSPECIFIED ANEMIA 03/08/2008  . OTHER ENDOCARDITIS VALVE UNSPECIFIED 03/02/2007   History   Social History  . Marital Status: Married    Spouse Name: N/A    Number of Children: N/A  . Years of Education: N/A   Occupational History  . 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 newspaper delivery     delivers papers to distributors, part time on weekends.    Social History Main Topics  . Smoking status: Former Smoker -- 2.00 packs/day for 15 years  Types: Cigarettes    Quit date: 08/08/1989  . Smokeless tobacco: Never Used  . Alcohol Use: 0.0 oz/week    0 Not specified per week     Comment: "stopped drinking in 1991; never treated for abuse"  . Drug Use: Yes    Special: Cocaine, Heroin, Marijuana     Comment: "stopped using drugs in ~ 1984"  . Sexual Activity: No   Other Topics Concern  . Not on file   Social History Narrative   REGULAR EXERCISE 2X [employer] TREADMILL,WEIGHTS    David [name]'s family history includes Ovarian cancer in his  sister. There is no history of Diabetes, Heart disease, or Stroke.      Objective:    Filed Vitals:   06/07/14 1400  BP: 110/70  Pulse: 80    Physical Exam  well-developed African-American male in no acute distress, pleasant blood pressure 110/70 pulse 80 height 5 foot 10 weight 158. HEENT; nontraumatic normocephalic EOMI PERRLA sclera anicteric, Supple ;no JVD, Cardiovascular; regular rate and rhythm with S1-S2 no murmur rub or gallop, Pulmonary; clear bilaterally, Abdomen ;soft nontender nondistended bowel sounds are active there is no palpable mass or hepatosplenomegaly, Rectal ;exam not done on the patient was Hemoccult negative at time of recent hospitalization, Extremities; no clubbing cyanosis or edema skin warm and dry, Psych ;mood and affect appropriate       Assessment & Plan:   #1 54 yo male with severe microcytic anemia-symptomatic-heme negative during recent hospitalization. Normal colonoscopy 2013, negative EGD 06/02/2014. Will need to R/O small bowel lesion or small bowel AVM's  #2 Schistocytes on peripheral smear- r/o hemolytic component-hematology eval pending #3 S/P mitral valve repair #4 GERD  Plan; Repeat cbc today, check Iron studies and replace if indicated Schedule for Capsule endoscopy - procedure discussed in detail with pt and he is agreeable to proceed, If capsule endoscopy negative and pt iron deficient will likely require repeat Colonoscopy. Pt advised to call for increase in fatigue,recurrent tachycardia -will need to follow hgb's   Amy Genia Harold PA-C 06/07/2014   Addendum: Reviewed and agree with management. Patient also being treated for H. pylori found on gastric biopsies. Jerene Bears, MD

## 2014-06-07 NOTE — Patient Instructions (Signed)
Your next office appointment will be determined based upon review of your pending labs. Those instructions will be transmitted to you through My Chart   Critical values will be called   Followup as needed for your acute issue. Please report any significant change in your symptoms.

## 2014-06-07 NOTE — Telephone Encounter (Signed)
Patient scheduled for SBCE on 06/14/14. Patient given verbal and written instructions. Verbalizes understanding.

## 2014-06-07 NOTE — Progress Notes (Signed)
Pre visit review using our clinic review tool, if applicable. No additional management support is needed unless otherwise documented below in the visit note. 

## 2014-06-08 ENCOUNTER — Encounter: Payer: Self-pay | Admitting: Internal Medicine

## 2014-06-08 ENCOUNTER — Other Ambulatory Visit: Payer: Self-pay | Admitting: Internal Medicine

## 2014-06-08 ENCOUNTER — Other Ambulatory Visit: Payer: 59

## 2014-06-08 DIAGNOSIS — A048 Other specified bacterial intestinal infections: Secondary | ICD-10-CM

## 2014-06-09 ENCOUNTER — Telehealth: Payer: Self-pay | Admitting: Physician Assistant

## 2014-06-09 LAB — HELICOBACTER PYLORI  SPECIAL ANTIGEN: H. PYLORI Antigen: NEGATIVE

## 2014-06-09 LAB — TISSUE TRANSGLUTAMINASE, IGA: Tissue Transglutaminase Ab, IgA: 1 U/mL (ref <4)

## 2014-06-09 NOTE — Telephone Encounter (Signed)
Advised to continue the ATB's

## 2014-06-10 ENCOUNTER — Other Ambulatory Visit: Payer: Self-pay

## 2014-06-10 NOTE — Addendum Note (Signed)
Addended by: Marlon Pel on: 06/10/2014 10:52 AM   Modules accepted: Orders

## 2014-06-13 ENCOUNTER — Other Ambulatory Visit: Payer: Self-pay | Admitting: Internal Medicine

## 2014-06-13 ENCOUNTER — Telehealth: Payer: Self-pay | Admitting: Internal Medicine

## 2014-06-13 DIAGNOSIS — R3129 Other microscopic hematuria: Secondary | ICD-10-CM

## 2014-06-13 NOTE — Telephone Encounter (Signed)
Patient states he was seen by Amy (PA) in Dr. Benjiman Core office Gertie Fey).  States he had an urinalysis done.  They seen blood in his urin and told him to call back to let Dr. [name] know.  Patient states he did a lot of walking this weekend and states that the blood was more noticeable then.  Please advise.

## 2014-06-13 NOTE — Telephone Encounter (Signed)
done

## 2014-06-13 NOTE — Telephone Encounter (Signed)
Patient needs a referral.

## 2014-06-13 NOTE — Telephone Encounter (Signed)
He needs to see Dr Janice Norrie; let me know if referral needed.

## 2014-06-14 ENCOUNTER — Telehealth: Payer: Self-pay | Admitting: Internal Medicine

## 2014-06-14 NOTE — Telephone Encounter (Signed)
Pt called check up on the referral for Urology and also request referral to go see Dr. Stanford Breed (cardiologist). Please call pt

## 2014-06-17 ENCOUNTER — Other Ambulatory Visit: Payer: Self-pay | Admitting: Internal Medicine

## 2014-06-17 DIAGNOSIS — I059 Rheumatic mitral valve disease, unspecified: Secondary | ICD-10-CM

## 2014-06-17 NOTE — Telephone Encounter (Signed)
Urology referral was faxed 06/16/14. They will make him an appointment and send it back to [place]. We will call him when we receive that information. Patient's request for cardiology referral to Dr. Stanford Breed must be entered by the provider if he feels it is appropriate. Forwarded to MD.

## 2014-06-20 ENCOUNTER — Ambulatory Visit (INDEPENDENT_AMBULATORY_CARE_PROVIDER_SITE_OTHER): Payer: 59 | Admitting: Internal Medicine

## 2014-06-20 ENCOUNTER — Encounter: Payer: Self-pay | Admitting: Internal Medicine

## 2014-06-20 ENCOUNTER — Other Ambulatory Visit (INDEPENDENT_AMBULATORY_CARE_PROVIDER_SITE_OTHER): Payer: 59

## 2014-06-20 VITALS — BP 134/76 | Temp 97.6°F | Ht 70.0 in | Wt 158.5 lb

## 2014-06-20 DIAGNOSIS — D509 Iron deficiency anemia, unspecified: Secondary | ICD-10-CM

## 2014-06-20 DIAGNOSIS — R319 Hematuria, unspecified: Secondary | ICD-10-CM

## 2014-06-20 DIAGNOSIS — R61 Generalized hyperhidrosis: Secondary | ICD-10-CM

## 2014-06-20 LAB — CBC WITH DIFFERENTIAL/PLATELET
Basophils Absolute: 0 10*3/uL (ref 0.0–0.1)
Basophils Relative: 0.5 % (ref 0.0–3.0)
Eosinophils Absolute: 0.1 10*3/uL (ref 0.0–0.7)
Eosinophils Relative: 2.3 % (ref 0.0–5.0)
HCT: 22.6 % — CL (ref 39.0–52.0)
Hemoglobin: 7.5 g/dL — CL (ref 13.0–17.0)
Lymphocytes Relative: 40.6 % (ref 12.0–46.0)
Lymphs Abs: 2.1 10*3/uL (ref 0.7–4.0)
MCHC: 33 g/dL (ref 30.0–36.0)
MCV: 75.4 fl — ABNORMAL LOW (ref 78.0–100.0)
Monocytes Absolute: 0.5 10*3/uL (ref 0.1–1.0)
Monocytes Relative: 9 % (ref 3.0–12.0)
Neutro Abs: 2.5 10*3/uL (ref 1.4–7.7)
Neutrophils Relative %: 47.6 % (ref 43.0–77.0)
Platelets: 182 10*3/uL (ref 150.0–400.0)
RBC: 3 Mil/uL — ABNORMAL LOW (ref 4.22–5.81)
RDW: 42.1 % — ABNORMAL HIGH (ref 11.5–15.5)
WBC: 5.3 10*3/uL (ref 4.0–10.5)

## 2014-06-20 NOTE — Patient Instructions (Addendum)
  Please keep a temperature diary. Monitor your temperature if you have chills or sweats. This may help define the cause of your symptoms.  5:47 PM as we discussed over the phone; an urgent Hematology consult will be pursued. Please continue to observe for blood in the urine, black or tarry stool, rectal bleeding, or abnormal bruising or bleeding. Go to the emergency room should you have chest pain or shortness of breath.

## 2014-06-20 NOTE — Progress Notes (Signed)
   Subjective:    Patient ID: David [name], male    DOB: 1960-07-22, 54 y.o.   MRN: [id number]  HPI   He has had night sweats the evenings of 2/19 and 2/20.  Initially he awoke at 3 AM to void and found that his T shirt was wet with sweat. This recurred the next night. He is not having daytime  sweating.  He has had some chest pressure and tightness only after eating. This happened 1-2 times in the last 2 weeks. He denies any associated hoarseness or dysphagia.  He has noted the urine to be dark; he had microscopic hematuria on 06/07/14 at the GI appointment. The change in color urine has occurred every day at least once and twice yesterday. This does seem to worsen when he is active.  He has an appointment see his Urologist 2/29  He denies fever, chills, malaise, weight loss.  He has no cough or sputum production.  He has no other bleeding dyscrasias.  Review of Systems Epistaxis, hemoptysis, melena, or rectal bleeding denied.  There is no abnormal bruising , bleeding, or difficulty stopping bleeding with injury.    Objective:   Physical Exam   Positive or pertinent findings include: He has an upper partial. There is accentuation of both heart sounds, especially the first heart sound. Loud S4 versus click is noted at the apex. He has an epidermoid inclusion cyst of the upper back without associated cellulitis or pustule formation.  General appearance :adequately nourished; in no distress. Eyes: No conjunctival inflammation or scleral icterus is present. Oral exam: Lips and gums are healthy appearing.There is no oropharyngeal erythema or exudate noted.  Heart:  Normal rate and regular rhythm. S1 and S2 normal without gallop, murmur, click, rub or other extra sounds   Lungs:Chest clear to auscultation; no wheezes, rhonchi,rales ,or rubs present.No increased work of breathing.  Abdomen: bowel sounds normal, soft and non-tender without masses, organomegaly or hernias noted.  No  guarding or rebound. Vascular : all pulses equal ; no bruits present. Skin:Warm & dry.  Intact without suspicious lesions or rashes ; no tenting Lymphatic: No lymphadenopathy is noted about the head, neck, axilla Neuro: Strength, tone  normal.        Assessment & Plan:  #1 night sweats 2  #2 hematuria  #3 anemia  Plan: See orders and recommendations  5:40 PM I received a stat call from the lab; his hemoglobin is 7.5 and hematocrit 22.6. Urgent hematology consultation will be pursued. He'll be told to go the emergency room if having chest pain or shortness of breath.

## 2014-06-20 NOTE — Progress Notes (Signed)
Pre visit review using our clinic review tool, if applicable. No additional management support is needed unless otherwise documented below in the visit note. 

## 2014-06-21 ENCOUNTER — Inpatient Hospital Stay (HOSPITAL_COMMUNITY)
Admission: EM | Admit: 2014-06-21 | Discharge: 2014-06-23 | DRG: 812 | Disposition: A | Discharge status: Home / Self Care | Payer: 59 | Attending: Internal Medicine | Admitting: Internal Medicine

## 2014-06-21 ENCOUNTER — Encounter (HOSPITAL_COMMUNITY): Payer: Self-pay | Admitting: Neurology

## 2014-06-21 ENCOUNTER — Emergency Department (HOSPITAL_COMMUNITY): Payer: 59

## 2014-06-21 DIAGNOSIS — Z87891 Personal history of nicotine dependence: Secondary | ICD-10-CM | POA: Diagnosis not present

## 2014-06-21 DIAGNOSIS — N3091 Cystitis, unspecified with hematuria: Secondary | ICD-10-CM | POA: Diagnosis present

## 2014-06-21 DIAGNOSIS — I059 Rheumatic mitral valve disease, unspecified: Secondary | ICD-10-CM | POA: Diagnosis present

## 2014-06-21 DIAGNOSIS — C61 Malignant neoplasm of prostate: Secondary | ICD-10-CM | POA: Diagnosis present

## 2014-06-21 DIAGNOSIS — R911 Solitary pulmonary nodule: Secondary | ICD-10-CM | POA: Diagnosis present

## 2014-06-21 DIAGNOSIS — N3090 Cystitis, unspecified without hematuria: Secondary | ICD-10-CM | POA: Diagnosis present

## 2014-06-21 DIAGNOSIS — K219 Gastro-esophageal reflux disease without esophagitis: Secondary | ICD-10-CM | POA: Diagnosis present

## 2014-06-21 DIAGNOSIS — Z79899 Other long term (current) drug therapy: Secondary | ICD-10-CM | POA: Diagnosis not present

## 2014-06-21 DIAGNOSIS — Z881 Allergy status to other antibiotic agents status: Secondary | ICD-10-CM | POA: Diagnosis not present

## 2014-06-21 DIAGNOSIS — D649 Anemia, unspecified: Principal | ICD-10-CM | POA: Diagnosis present

## 2014-06-21 DIAGNOSIS — K210 Gastro-esophageal reflux disease with esophagitis: Secondary | ICD-10-CM

## 2014-06-21 LAB — BASIC METABOLIC PANEL WITH GFR
Anion gap: 7 (ref 5–15)
BUN: 9 mg/dL (ref 6–23)
CO2: 28 mmol/L (ref 19–32)
Calcium: 9.6 mg/dL (ref 8.4–10.5)
Chloride: 104 mmol/L (ref 96–112)
Creatinine, Ser: 1.13 mg/dL (ref 0.50–1.35)
GFR calc Af Amer: 83 mL/min — ABNORMAL LOW
GFR calc non Af Amer: 72 mL/min — ABNORMAL LOW
Glucose, Bld: 103 mg/dL — ABNORMAL HIGH (ref 70–99)
Potassium: 3.8 mmol/L (ref 3.5–5.1)
Sodium: 139 mmol/L (ref 135–145)

## 2014-06-21 LAB — CBC
HCT: 22.8 % — ABNORMAL LOW (ref 39.0–52.0)
Hemoglobin: 7.2 g/dL — ABNORMAL LOW (ref 13.0–17.0)
MCH: 23.7 pg — ABNORMAL LOW (ref 26.0–34.0)
MCHC: 31.6 g/dL (ref 30.0–36.0)
MCV: 75 fL — ABNORMAL LOW (ref 78.0–100.0)
Platelets: 219 K/uL (ref 150–400)
RBC: 3.04 MIL/uL — ABNORMAL LOW (ref 4.22–5.81)
WBC: 5 K/uL (ref 4.0–10.5)

## 2014-06-21 LAB — RETICULOCYTES
RBC.: 3.22 MIL/uL — ABNORMAL LOW (ref 4.22–5.81)
Retic Count, Absolute: 231.8 10*3/uL — ABNORMAL HIGH (ref 19.0–186.0)
Retic Ct Pct: 7.2 % — ABNORMAL HIGH (ref 0.4–3.1)

## 2014-06-21 LAB — HEPATIC FUNCTION PANEL
ALT: 36 U/L (ref 0–53)
AST: 126 U/L — ABNORMAL HIGH (ref 0–37)
Albumin: 4.4 g/dL (ref 3.5–5.2)
Alkaline Phosphatase: 46 U/L (ref 39–117)
Bilirubin, Direct: 0.5 mg/dL (ref 0.0–0.5)
Indirect Bilirubin: 1.1 mg/dL — ABNORMAL HIGH (ref 0.3–0.9)
Total Bilirubin: 1.6 mg/dL — ABNORMAL HIGH (ref 0.3–1.2)
Total Protein: 7.3 g/dL (ref 6.0–8.3)

## 2014-06-21 LAB — POC OCCULT BLOOD, ED: Fecal Occult Bld: NEGATIVE

## 2014-06-21 LAB — I-STAT TROPONIN, ED: Troponin i, poc: 0.01 ng/mL (ref 0.00–0.08)

## 2014-06-21 LAB — I-STAT CG4 LACTIC ACID, ED: Lactic Acid, Venous: 0.83 mmol/L (ref 0.5–2.0)

## 2014-06-21 MED ORDER — SODIUM CHLORIDE 0.9 % IV BOLUS (SEPSIS)
1000.0000 mL | Freq: Once | INTRAVENOUS | Status: AC
  Administered 2014-06-21: 1000 mL via INTRAVENOUS

## 2014-06-21 NOTE — ED Provider Notes (Signed)
Date: 06/21/2014  Rate: 81  Rhythm: normal sinus rhythm  QRS Axis: normal  Intervals: normal  ST/T Wave abnormalities: ST elevations diffusely  Conduction Disutrbances:none  Narrative Interpretation:   Old EKG Reviewed: none available    Ezequiel Essex, MD 06/21/14 2130

## 2014-06-21 NOTE — ED Notes (Signed)
Pt reports feeling tightness in chest since today, also back pain. Feels weak and tired today. Reports low hemoglobin yesterday was 7.5. Denies cardiac hx. Pt is a x 4

## 2014-06-21 NOTE — H&P (Addendum)
[facility] History and Physical  David [name] EQA:[id number] DOB: [birth year] DOA: 06/21/2014  Referring physician: ED physician PCP: Unice Cobble, MD  Specialists:   Chief Complaint: Hemoglobin drop  HPI: David [name] is a 54 y.o. male with past medical history significant for endocarditis, status post mitral valve repair 2009, hyperlipidemia, GERD, recently diagnosed prostate cancer, who presents with hemoglobin drop.   She was recently hospitalized because of a symptomatic anemia. He had negative workup in the hospital, including EGD. He was discharged from hospital 2 weeks ago. He was seen by his PCP yesterday and had blood test done. He was called and advised to come to the emergency department as his hemoglobin levels had dropped again to 7.5. Patient reports having chest tightness at home, but no SOB or dizziness. He endorses associated fatigue, chills and night sweats. Denies any fevers, vomiting, hemoptysis, melena, bright red blood per rectum. Patient denies abdominal pain, diarrhea, constipation, dysuria, urgency, frequency, hematuria, skin rashes or leg swelling. No unilateral weakness, numbness or tingling sensations. No vision change or hearing loss.  In ED, patient was found to have hgb drop from 8.7 on 2/9 to 7.2 today. Negative FOBT, negative troponin, lactate 0.83. Patient is admitted to inpatient for further evaluation and treatment.  Review of Systems: As presented in the history of presenting illness, rest negative.  Where does patient live?  At home Can patient participate in ADLs? Yes  Allergy:  Allergies  Allergen Reactions  . Clarithromycin     REACTION: NAUSEA    Past Medical History  Diagnosis Date  . GERD (gastroesophageal reflux disease)   . Endocarditis 10-1999     RECURRENCE 02-2008  . History of chest tube placement 1980    R SIDE FOR SPONTANEOUS  PNEUMOTHORAX  . Periodontitis     PMH  . C. difficile colitis 2009  . External  hemorrhoids without mention of complication 97/98/9211    Colonoscopy  . History of blood transfusion 02/2008, 04/2014.     for anemia.   . Prostate cancer     "5 of the samples from the biopsy was positive; Greeson # is 6"  . DDD (degenerative disc disease), lumbar   . Anemia 2009, 04/2014.     Past Surgical History  Procedure Laterality Date  . Colonoscopy  2005, 03/2012    external rrhoids 2005, normal exam 2013; [facility]  . Multiple tooth extractions  2009    "to prep for mitral valve repair"  . Mitral valve repair  2009    via a mini thoracotomy  . Prostate biopsy  03/2014  . Cardiac catheterization  2009  . Esophagogastroduodenoscopy N/A 06/02/2014    Procedure: ESOPHAGOGASTRODUODENOSCOPY (EGD);  Surgeon: Jerene Bears, MD;  Location: [facility] ENDOSCOPY;  Service: Endoscopy;  Laterality: N/A;    Social History:  reports that he quit smoking about 24 years ago. His smoking use included Cigarettes. He has a 30 pack-year smoking history. He has never used smokeless tobacco. He reports that he drinks alcohol. He reports that he uses illicit drugs (Cocaine, Heroin, and Marijuana).  Family History:  Family History  Problem Relation Age of Onset  . Ovarian cancer Sister   . Diabetes Neg Hx   . Heart disease Neg Hx   . Stroke Neg Hx      Prior to Admission medications   Medication Sig Start Date End Date Taking? Authorizing Provider  loratadine (CLARITIN) 10 MG tablet Take 1 tablet (10 mg total) by mouth daily. Patient  taking differently: Take 10 mg by mouth daily as needed.  03/22/14  Yes Kaitlyn Szekalski, PA-C  pantoprazole (PROTONIX) 40 MG tablet Take 1 tablet (40 mg total) by mouth daily. Patient taking differently: Take 40 mg by mouth daily as needed.  06/02/14   Verlee Monte, MD  tadalafil (CIALIS) 20 MG tablet 1/2- 1 every 3 days as needed Patient not taking: Reported on 06/20/2014 11/03/13   Hendricks Limes, MD    Physical Exam: Filed Vitals:   06/21/14 2315 06/21/14 2330  06/21/14 2345 06/22/14 0020  BP: 116/69 139/81 122/70 126/85  Pulse: 87 93 85 88  Temp:    98.6 F (37 C)  TempSrc:    Oral  Resp: 19 19 13 18   SpO2: 100% 100% 100% 100%   General: Not in acute distress. Pale looking HEENT:       Eyes: PERRL, EOMI, mild scleral icterus       ENT: No discharge from the ears and nose, no pharynx injection, no tonsillar enlargement.        Neck: No JVD, no bruit, no mass felt. Cardiac: S1/S2, RRR, No murmurs, No gallops or rubs Pulm: Good air movement bilaterally. Clear to auscultation bilaterally. No rales, wheezing, rhonchi or rubs. Abd: Soft, nondistended, nontender, no rebound pain, no organomegaly, BS present Ext: No edema bilaterally. 2+DP/PT pulse bilaterally Musculoskeletal: No joint deformities, erythema, or stiffness, ROM full Skin: No rashes.  Neuro: Alert and oriented X3, cranial nerves II-XII grossly intact, muscle strength 5/5 in all extremeties, sensation to light touch intact.  Psych: Patient is not psychotic, no suicidal or hemocidal ideation.  Labs on Admission:  Basic Metabolic Panel:  Recent Labs Lab 06/21/14 1851  NA 139  K 3.8  CL 104  CO2 28  GLUCOSE 103*  BUN 9  CREATININE 1.13  CALCIUM 9.6   Liver Function Tests:  Recent Labs Lab 06/21/14 2241  AST 126*  ALT 36  ALKPHOS 46  BILITOT 1.6*  PROT 7.3  ALBUMIN 4.4   No results for input(s): LIPASE, AMYLASE in the last 168 hours. No results for input(s): AMMONIA in the last 168 hours. CBC:  Recent Labs Lab 06/20/14 1722 06/21/14 1851 06/22/14 0128  WBC 5.3 5.0 4.0  NEUTROABS 2.5  --   --   HGB 7.5 Repeated and verified X2.* 7.2* 6.7*  HCT 22.6 Repeated and verified X2.* 22.8* 22.1*  MCV 75.4* 75.0* 78.1  PLT 182.0 219 217   Cardiac Enzymes:  Recent Labs Lab 06/22/14 0128  TROPONINI <0.03    BNP (last 3 results) No results for input(s): BNP in the last 8760 hours.  ProBNP (last 3 results) No results for input(s): PROBNP in the last 8760  hours.  CBG: No results for input(s): GLUCAP in the last 168 hours.  Radiological Exams on Admission: Dg Chest 2 View  06/21/2014   CLINICAL DATA:  Chest pain for 1 day.  EXAM: CHEST  2 VIEW  COMPARISON:  May 31, 2014.  FINDINGS: The heart size and mediastinal contours are within normal limits. No pneumothorax or plural effusion is noted. Status post cardiac valve repair. Both lungs are clear. The visualized skeletal structures are unremarkable.  IMPRESSION: No acute cardiopulmonary abnormality seen.   Electronically Signed   By: Marijo Conception, M.D.   On: 06/21/2014 21:06    EKG: will get one  Assessment/Plan Principal Problem:   Symptomatic anemia Active Problems:   Mitral valve disorder   GERD   Prostate cancer  Addendum:  Hemoglobin dropped to 6.7. -Will transfuse 2 units of blood -get peripheral smear before transfusion  Sympomatic anemia: In previous admission, gastroenterology was consulted, EGD did not show no evidence of bleeding. GI recommended outpatient capsule endoscopy, if negative, needs referral to Hem/Onc for evaluation of his anemia. Patient is status post transfusion of 2 units of packed RBCs with hemoglobin 8.7 on 06/07/14. Now hgb dropped again to 7.2. Patient had episode of chest tightness, which has resolved when I evaluated patient in ED. Currently hemodynamically stable. Patient had elevated total bilirubin on 06/01/14 and negative hepatitis panel in the past, indicating patient may have hemolysis. Given that the patient reports having night sweats, it is possible that the patient may have underlying blood disorder, such as lymphoma. Patient has a history of mitral valve repair 2009. Another possibility needs to be ruled out is that the patient could have mechanical red blood cell breaking down.  -will admit to tele bed -cbc q6h -check LFT, LDH, haptoglobin, peripheral smear -May consult to hematology if positive for hemolysis -check 2d echo for evaluating  mitral valve.  GERD -protonix  Prostate cancer: Patient was diagnosed as prostate cancer on 03/2014. He has low Gleason score and low PSA per patient. Patient was scheduled to see urologist on 06/27/14 and oncologist on 06/30/14. -Follow-up with a urologist and oncologist  DVT ppx: SQ Heparin          Code Status: Full code Family Communication:    Yes, patient's   wife    at bed side Disposition Plan: Admit to inpatient   Date of Service 06/22/2014    Ivor Costa [facility] Pager [phone]  If 7PM-7AM, please contact night-coverage [email or web address] Password [facility] 06/22/2014, 3:25 AM

## 2014-06-21 NOTE — ED Provider Notes (Signed)
CSN: [id number]     Arrival date & time 06/21/14  1839 History   First MD Initiated Contact with Patient 06/21/14 2051     Chief Complaint  Patient presents with  . Chest Pain     (Consider location/radiation/quality/duration/timing/severity/associated sxs/prior Treatment) HPI Comments: Patient is a 54 year old male past medical history significant for endocarditis status post mitral valve repair, hyperlipidemia, GERD presenting to the emergency department for anemia. Patient was discharged from the hospital recently 2 weeks ago for undetermined cause of symptomatic anemia. He was seen by his PCP yesterday called and advised to come to the emergency department as his hemoglobin levels had dropped again to 7.5 and he had developed chest pressure. He states he's having intermittent episodes of chest pressure lasting only a few minutes at a time. He endorses associated fatigue and chills and night sweats. Denies any fevers, vomiting, hemoptysis, melena, bright red blood per rectum. He states he his due for a colonoscopy.   Past Medical History  Diagnosis Date  . GERD (gastroesophageal reflux disease)   . Endocarditis 10-1999     RECURRENCE 02-2008  . History of chest tube placement 1980    R SIDE FOR SPONTANEOUS  PNEUMOTHORAX  . Periodontitis     PMH  . C. difficile colitis 2009  . External hemorrhoids without mention of complication 16/60/6301    Colonoscopy  . History of blood transfusion 02/2008, 04/2014.     for anemia.   . Prostate cancer     "5 of the samples from the biopsy was positive; Greeson # is 6"  . DDD (degenerative disc disease), lumbar   . Anemia 2009, 04/2014.    Past Surgical History  Procedure Laterality Date  . Colonoscopy  2005, 03/2012    external rrhoids 2005, normal exam 2013; [facility]  . Multiple tooth extractions  2009    "to prep for mitral valve repair"  . Mitral valve repair  2009    via a mini thoracotomy  . Prostate biopsy  03/2014  . Cardiac  catheterization  2009  . Esophagogastroduodenoscopy N/A 06/02/2014    Procedure: ESOPHAGOGASTRODUODENOSCOPY (EGD);  Surgeon: Jerene Bears, MD;  Location: [facility] ENDOSCOPY;  Service: Endoscopy;  Laterality: N/A;   Family History  Problem Relation Age of Onset  . Ovarian cancer Sister   . Diabetes Neg Hx   . Heart disease Neg Hx   . Stroke Neg Hx    History  Substance Use Topics  . Smoking status: Former Smoker -- 2.00 packs/day for 15 years    Types: Cigarettes    Quit date: 08/08/1989  . Smokeless tobacco: Never Used  . Alcohol Use: 0.0 oz/week    0 Standard drinks or equivalent per week     Comment: "stopped drinking in 1991; never treated for abuse"    Review of Systems  Constitutional: Positive for chills and fatigue. Negative for fever.  Cardiovascular: Positive for chest pain.  Gastrointestinal: Negative for vomiting, diarrhea, blood in stool and anal bleeding.  All other systems reviewed and are negative.     Allergies  Clarithromycin  Home Medications   Prior to Admission medications   Medication Sig Start Date End Date Taking? Authorizing Provider  loratadine (CLARITIN) 10 MG tablet Take 1 tablet (10 mg total) by mouth daily. Patient taking differently: Take 10 mg by mouth daily as needed.  03/22/14  Yes Kaitlyn Szekalski, PA-C  pantoprazole (PROTONIX) 40 MG tablet Take 1 tablet (40 mg total) by mouth daily. Patient taking differently:  Take 40 mg by mouth daily as needed.  06/02/14   Verlee Monte, MD  tadalafil (CIALIS) 20 MG tablet 1/2- 1 every 3 days as needed Patient not taking: Reported on 06/20/2014 11/03/13   Hendricks Limes, MD   BP 122/70 mmHg  Pulse 85  Temp(Src) 98.5 F (36.9 C)  Resp 13  SpO2 100% Physical Exam  Constitutional: He is oriented to person, place, and time. He appears well-developed and well-nourished. No distress.  HENT:  Head: Normocephalic and atraumatic.  Right Ear: External ear normal.  Left Ear: External ear normal.  Nose: Nose  normal.  Mouth/Throat: Oropharynx is clear and moist. No oropharyngeal exudate.  Eyes: Conjunctivae are normal.  Bilateral conjunctival pallor.  Neck: Neck supple.  Cardiovascular: Normal rate and regular rhythm.  Exam reveals gallop and S4.   Loud S4 noted at left sternal border.  Pulmonary/Chest: Effort normal and breath sounds normal. No respiratory distress.  Abdominal: Soft. Bowel sounds are normal. There is no tenderness.  Genitourinary:  Brown stool on DRE. No tenderness. No hemorrhoids.  Musculoskeletal: Normal range of motion. He exhibits no edema.  Neurological: He is alert and oriented to person, place, and time.  Skin: Skin is warm and dry. No rash noted.  Nursing note and vitals reviewed.   ED Course  Procedures (including critical care time) Medications  sodium chloride 0.9 % bolus 1,000 mL (0 mLs Intravenous Stopped 06/21/14 2254)    Labs Review Labs Reviewed  CBC - Abnormal; Notable for the following:    RBC 3.04 (*)    Hemoglobin 7.2 (*)    HCT 22.8 (*)    MCV 75.0 (*)    MCH 23.7 (*)    All other components within normal limits  BASIC METABOLIC PANEL - Abnormal; Notable for the following:    Glucose, Bld 103 (*)    GFR calc non Af Amer 72 (*)    GFR calc Af Amer 83 (*)    All other components within normal limits  RETICULOCYTES - Abnormal; Notable for the following:    Retic Ct Pct 7.2 (*)    RBC. 3.22 (*)    Retic Count, Manual 231.8 (*)    All other components within normal limits  HEPATIC FUNCTION PANEL - Abnormal; Notable for the following:    AST 126 (*)    Total Bilirubin 1.6 (*)    Indirect Bilirubin 1.1 (*)    All other components within normal limits  CULTURE, BLOOD (ROUTINE X 2)  CULTURE, BLOOD (ROUTINE X 2)  VITAMIN B12  FOLATE  IRON AND TIBC  FERRITIN  URINALYSIS, ROUTINE W REFLEX MICROSCOPIC  I-STAT TROPOININ, ED  I-STAT CG4 LACTIC ACID, ED  POC OCCULT BLOOD, ED  I-STAT CG4 LACTIC ACID, ED  TYPE AND SCREEN    Imaging  Review Dg Chest 2 View  06/21/2014   CLINICAL DATA:  Chest pain for 1 day.  EXAM: CHEST  2 VIEW  COMPARISON:  May 31, 2014.  FINDINGS: The heart size and mediastinal contours are within normal limits. No pneumothorax or plural effusion is noted. Status post cardiac valve repair. Both lungs are clear. The visualized skeletal structures are unremarkable.  IMPRESSION: No acute cardiopulmonary abnormality seen.   Electronically Signed   By: Marijo Conception, M.D.   On: 06/21/2014 21:06     EKG Interpretation None      Discussed S4 with patient who states after his MV repair he did not have any further abnormal heart sounds but  was told yesterday by his PCP that they have returned. MDM   Final diagnoses:  Symptomatic anemia   Filed Vitals:   06/21/14 2345  BP: 122/70  Pulse: 85  Temp:   Resp: 13   Afebrile, NAD, non-toxic appearing, AAOx4.  I have reviewed nursing notes, vital signs, and all appropriate lab and imaging results for this patient.  Pt is a 54 y.o. male with Pmhx as above who presents with From PCPs office with symptomatic anemia. He has had fatigue, night sweats for 2 weeks since last d/c. Patient developed CP today, troponin negative, CXR negative, EKG unremarkable. Hgb 7.2 Given CP will give 1 unit of blood to start for symptomatic care. Concern for possible hemolytic anemia. Will plan on transfusion and admission.Patient d/w with Dr. Wyvonnia Dusky, agrees with plan.    Harlow Mares, PA-C 06/22/14 0010  Ezequiel Essex, MD 06/22/14 [phone]

## 2014-06-22 ENCOUNTER — Inpatient Hospital Stay (HOSPITAL_COMMUNITY): Payer: 59

## 2014-06-22 ENCOUNTER — Encounter (HOSPITAL_COMMUNITY): Payer: Self-pay | Admitting: General Practice

## 2014-06-22 DIAGNOSIS — C61 Malignant neoplasm of prostate: Secondary | ICD-10-CM

## 2014-06-22 DIAGNOSIS — R911 Solitary pulmonary nodule: Secondary | ICD-10-CM

## 2014-06-22 DIAGNOSIS — E119 Type 2 diabetes mellitus without complications: Secondary | ICD-10-CM

## 2014-06-22 DIAGNOSIS — D649 Anemia, unspecified: Principal | ICD-10-CM

## 2014-06-22 DIAGNOSIS — I340 Nonrheumatic mitral (valve) insufficiency: Secondary | ICD-10-CM

## 2014-06-22 LAB — COMPREHENSIVE METABOLIC PANEL
ALT: 29 U/L (ref 0–53)
AST: 90 U/L — ABNORMAL HIGH (ref 0–37)
Albumin: 3.3 g/dL — ABNORMAL LOW (ref 3.5–5.2)
Alkaline Phosphatase: 34 U/L — ABNORMAL LOW (ref 39–117)
Anion gap: 8 (ref 5–15)
BUN: 9 mg/dL (ref 6–23)
CO2: 25 mmol/L (ref 19–32)
Calcium: 8.7 mg/dL (ref 8.4–10.5)
Chloride: 107 mmol/L (ref 96–112)
Creatinine, Ser: 0.96 mg/dL (ref 0.50–1.35)
GFR calc Af Amer: >90 mL/min (ref >90)
GFR calc non Af Amer: >90 mL/min (ref >90)
Glucose, Bld: 100 mg/dL — ABNORMAL HIGH (ref 70–99)
Potassium: 3.8 mmol/L (ref 3.5–5.1)
Sodium: 140 mmol/L (ref 135–145)
Total Bilirubin: 0.8 mg/dL (ref 0.3–1.2)
Total Protein: 5.7 g/dL — ABNORMAL LOW (ref 6.0–8.3)

## 2014-06-22 LAB — CBC
HCT: 22.1 % — ABNORMAL LOW (ref 39.0–52.0)
HCT: 20.6 % — ABNORMAL LOW (ref 39.0–52.0)
Hemoglobin: 6.7 g/dL — CL (ref 13.0–17.0)
Hemoglobin: 6.1 g/dL — CL (ref 13.0–17.0)
MCH: 23.7 pg — ABNORMAL LOW (ref 26.0–34.0)
MCH: 23.2 pg — ABNORMAL LOW (ref 26.0–34.0)
MCHC: 30.3 g/dL (ref 30.0–36.0)
MCHC: 29.6 g/dL — ABNORMAL LOW (ref 30.0–36.0)
MCV: 78.1 fL (ref 78.0–100.0)
MCV: 78.3 fL (ref 78.0–100.0)
Platelets: 217 10*3/uL (ref 150–400)
Platelets: 196 10*3/uL (ref 150–400)
RBC: 2.83 MIL/uL — ABNORMAL LOW (ref 4.22–5.81)
RBC: 2.63 MIL/uL — ABNORMAL LOW (ref 4.22–5.81)
RDW: 40.9 % — ABNORMAL HIGH (ref 11.5–15.5)
RDW: 41.3 % — ABNORMAL HIGH (ref 11.5–15.5)
WBC: 4 10*3/uL (ref 4.0–10.5)
WBC: 3.5 10*3/uL — ABNORMAL LOW (ref 4.0–10.5)

## 2014-06-22 LAB — URINALYSIS, ROUTINE W REFLEX MICROSCOPIC
Bilirubin Urine: NEGATIVE
Glucose, UA: NEGATIVE mg/dL
Ketones, ur: NEGATIVE mg/dL
Leukocytes, UA: NEGATIVE
Nitrite: NEGATIVE
Protein, ur: 30 mg/dL — AB
Specific Gravity, Urine: 1.019 (ref 1.005–1.030)
Urobilinogen, UA: 0.2 mg/dL (ref 0.0–1.0)
pH: 6 (ref 5.0–8.0)

## 2014-06-22 LAB — IRON AND TIBC
Iron: 74 ug/dL (ref 42–165)
Saturation Ratios: 24 % (ref 20–55)
TIBC: 304 ug/dL (ref 215–435)
UIBC: 230 ug/dL (ref 125–400)

## 2014-06-22 LAB — PATHOLOGIST SMEAR REVIEW

## 2014-06-22 LAB — TROPONIN I
Troponin I: <0.03 ng/mL (ref <0.031)
Troponin I: <0.03 ng/mL (ref <0.031)
Troponin I: <0.03 ng/mL (ref <0.031)

## 2014-06-22 LAB — URINE MICROSCOPIC-ADD ON

## 2014-06-22 LAB — FOLATE: Folate: >20 ng/mL

## 2014-06-22 LAB — CULTURE, URINE COMPREHENSIVE
Colony Count: NO GROWTH
Organism ID, Bacteria: NO GROWTH

## 2014-06-22 LAB — PROTIME-INR
INR: 1.33 (ref 0.00–1.49)
Prothrombin Time: 16.6 seconds — ABNORMAL HIGH (ref 11.6–15.2)

## 2014-06-22 LAB — PREPARE RBC (CROSSMATCH)

## 2014-06-22 LAB — APTT: aPTT: 29 seconds (ref 24–37)

## 2014-06-22 LAB — LACTATE DEHYDROGENASE: LDH: 2337 U/L — ABNORMAL HIGH (ref 94–250)

## 2014-06-22 LAB — FERRITIN: Ferritin: 31 ng/mL (ref 22–322)

## 2014-06-22 LAB — VITAMIN B12: Vitamin B-12: 489 pg/mL (ref 211–911)

## 2014-06-22 MED ORDER — IOHEXOL 300 MG/ML  SOLN
100.0000 mL | Freq: Once | INTRAMUSCULAR | Status: AC | PRN
  Administered 2014-06-22: 100 mL via INTRAVENOUS

## 2014-06-22 MED ORDER — PANTOPRAZOLE SODIUM 40 MG PO TBEC
40.0000 mg | DELAYED_RELEASE_TABLET | Freq: Every day | ORAL | Status: DC
  Administered 2014-06-22 – 2014-06-23 (×2): 40 mg via ORAL
  Filled 2014-06-22 (×2): qty 1

## 2014-06-22 MED ORDER — LORATADINE 10 MG PO TABS
10.0000 mg | ORAL_TABLET | Freq: Every day | ORAL | Status: DC | PRN
  Filled 2014-06-22: qty 1

## 2014-06-22 MED ORDER — HEPARIN SODIUM (PORCINE) 5000 UNIT/ML IJ SOLN
5000.0000 [IU] | Freq: Three times a day (TID) | INTRAMUSCULAR | Status: DC
  Administered 2014-06-22 – 2014-06-23 (×3): 5000 [IU] via SUBCUTANEOUS
  Filled 2014-06-22 (×6): qty 1

## 2014-06-22 MED ORDER — SODIUM CHLORIDE 0.9 % IV SOLN: Freq: Once | INTRAVENOUS | Status: DC

## 2014-06-22 MED ORDER — SODIUM CHLORIDE 0.9 % IJ SOLN
3.0000 mL | Freq: Two times a day (BID) | INTRAMUSCULAR | Status: DC
  Administered 2014-06-22 (×2): 3 mL via INTRAVENOUS

## 2014-06-22 MED ORDER — MORPHINE SULFATE 2 MG/ML IJ SOLN: 2.0000 mg | INTRAMUSCULAR | Status: DC | PRN

## 2014-06-22 MED ORDER — SODIUM CHLORIDE 0.9 % IV SOLN
Freq: Once | INTRAVENOUS | Status: AC
  Administered 2014-06-22: 05:00:00 via INTRAVENOUS

## 2014-06-22 MED ORDER — IOHEXOL 300 MG/ML  SOLN
25.0000 mL | INTRAMUSCULAR | Status: AC
  Administered 2014-06-22 (×2): 25 mL via ORAL

## 2014-06-22 NOTE — Progress Notes (Addendum)
[facility] PROGRESS NOTE  David [name] QIO:[id number] DOB: 04-07-1961 DOA: 06/21/2014 PCP: Unice Cobble, MD  Summary I've seen and examined David [name] at bedside and reviewed his chart. David [name] is a 54 y.o. male with past medical history significant for endocarditis, status post mitral valve repair 2009, hyperlipidemia, GERD, recently diagnosed prostate cancer, hospitalized last month for symptomatic anemia status post endoscopy earlier this month(followed by Dr Hilarie Fredrickson, supposed to have capsule endoscopy outpatient), who presented with drop in hemoglobin and his labwork is raising possibility of hemolysis(patient to have echocardiogram to assess repaired mitral valve). He reported some night sweats hence concern for hematological malignancies. He is getting PRBC transfusion. Will obtain CT chest/abdomen/pelvis with contrast to assess for lymphadenopathy, etc. Will consult hematology for further workup. Will consider GI evaluation for endoscopy depending on hematology/CT chest/abdomen/2-D echo results. Plan Symptomatic anemia  PRBC transfusion  Consult hematology  CT chest/abdomen/pelvis  Follow workup including LDH/sickle cell screen.  Consider GI consultation for Endoscopy Mitral valve disorder  2-D echo GERD/Prostate cancer  No acute changes Code Status: Full Code. Family Communication: Wife at bedside. Disposition Plan: Eventually home   Consultants:  Hematology  Procedures:  None  Antibiotics:  None  HPI/Subjective: Feels okay. Noticed some "blood in urine"  Objective: Filed Vitals:   06/22/14 0613  BP: 106/67  Pulse: 84  Temp: 98.8 F (37.1 C)  Resp: 17    Intake/Output Summary (Last 24 hours) at 06/22/14 0814 Last data filed at 06/22/14 3244  Gross per 24 hour  Intake    270 ml  Output      0 ml  Net    270 ml   Filed Weights   06/22/14 0020  Weight: 69.6 kg (153 lb 7 oz)    Exam:   General:  Comfortable at  rest.  Cardiovascular: S1-S2 normal.Mitral murmur. Pulse regular.  Respiratory: Good air entry bilaterally. No rhonchi or rales.  Abdomen: Soft and nontender. Normal bowel sounds. No organomegaly.  Musculoskeletal: No pedal edema   Neurological: Intact  Data Reviewed: Basic Metabolic Panel:  Recent Labs Lab 06/21/14 1851 06/22/14 0529  NA 139 PENDING  K 3.8 3.8  CL 104 PENDING  CO2 28 PENDING  GLUCOSE 103* 100*  BUN 9 9  CREATININE 1.13 0.96  CALCIUM 9.6 8.7   Liver Function Tests:  Recent Labs Lab 06/21/14 2241 06/22/14 0529  AST 126* 90*  ALT 36 29  ALKPHOS 46 34*  BILITOT 1.6* 0.8  PROT 7.3 5.7*  ALBUMIN 4.4 3.3*   No results for input(s): LIPASE, AMYLASE in the last 168 hours. No results for input(s): AMMONIA in the last 168 hours. CBC:  Recent Labs Lab 06/20/14 1722 06/21/14 1851 06/22/14 0128 06/22/14 0529  WBC 5.3 5.0 4.0 PENDING  NEUTROABS 2.5  --   --   --   HGB 7.5 Repeated and verified X2.* 7.2* 6.7* 6.1*  HCT 22.6 Repeated and verified X2.* 22.8* 22.1* PENDING  MCV 75.4* 75.0* 78.1 PENDING  PLT 182.0 219 217 PENDING   Cardiac Enzymes:  Recent Labs Lab 06/22/14 0128 06/22/14 0529  TROPONINI <0.03 <0.03   BNP (last 3 results) No results for input(s): BNP in the last 8760 hours.  ProBNP (last 3 results) No results for input(s): PROBNP in the last 8760 hours.  CBG: No results for input(s): GLUCAP in the last 168 hours.  Recent Results (from the past 240 hour(s))  CULTURE, URINE COMPREHENSIVE     Status: None   Collection Time: 06/20/14  5:22 PM  Result Value Ref Range Status   Colony Count NO GROWTH  Final   Organism ID, Bacteria NO GROWTH  Final     Studies: Dg Chest 2 View  06/21/2014   CLINICAL DATA:  Chest pain for 1 day.  EXAM: CHEST  2 VIEW  COMPARISON:  May 31, 2014.  FINDINGS: The heart size and mediastinal contours are within normal limits. No pneumothorax or plural effusion is noted. Status post cardiac valve  repair. Both lungs are clear. The visualized skeletal structures are unremarkable.  IMPRESSION: No acute cardiopulmonary abnormality seen.   Electronically Signed   By: Marijo Conception, M.D.   On: 06/21/2014 21:06    Scheduled Meds: . heparin  5,000 Units Subcutaneous 3 times per day  . pantoprazole  40 mg Oral Daily  . sodium chloride  3 mL Intravenous Q12H   Continuous Infusions:    Time spent: 25 minutes    Yarelie Hams  [facility] Pager [phone]. If 7PM-7AM, please contact night-coverage at [email or web address], password [facility] 06/22/2014, 8:14 AM  LOS: 1 day

## 2014-06-22 NOTE — Progress Notes (Signed)
  Echocardiogram 2D Echocardiogram has been performed.  David [name] 06/22/2014, 10:40 AM

## 2014-06-22 NOTE — Progress Notes (Signed)
Utilization review completed. Sahily Biddle, RN, BSN. 

## 2014-06-22 NOTE — Progress Notes (Signed)
Admitted pt to rm 2W14 from ED, pt alert and oriented, denied pain at this time, oriented to room, call bell placed within reach, orders carried out. Will monitor.   06/22/14 0020  Vitals  Temp 98.6 F (37 C)  Temp Source Oral  BP 126/85 mmHg  BP Location Left Arm  BP Method Automatic  Patient Position (if appropriate) Lying  Pulse Rate 88  Pulse Rate Source Dinamap  Resp 18  Oxygen Therapy  SpO2 100 %

## 2014-06-22 NOTE — Consult Note (Addendum)
[place]  [phone] [phone]  Clinic New consult Note   REFERRAL PHYSICIAN: Dr. Sanjuana Letters  CHIEF COMPLAINTS/PURPOSE OF CONSULTATION:  Anemia   HISTORY OF PRESENTING ILLNESS:  David [name] 54 y.o. male with past medical history significant for endocarditis, status post mitral valve repair 2009, hyperlipidemia, GERD, recently diagnosed prostate cancer, who was readmitted for symptomatic anemia yesterday. He is scheduled to see me in my clinic in next week, and now I'm seeing him in the hospital under Dr. Faythe Dingwall request.  He was hospitalized in 2009 for endocarditis, and underwent module of repair. He had severe anemia during that hospitalization, and he received 3 units of blood transfusion. He recovered well, and has been slightly anemic with hemoglobin around 11-12, the patient. He developed some fatigue, mild night sweats 3 weeks ago, he was evaluated by his primary care physician Dr. [name]. Routine lab test reviewed hemoglobin 5.9, MCV 70.6, normal WAC and a platelet count, he was referred to emergency room and was admitted. He underwent EGD and colonoscopy, which showed mild to gastropathy biopsy was negative. Colonoscopy was normal. He received 2 units of RBC, And was discharged home on 06/07/2014 with hemoglobin 8.7.  He followed up with Dr. Jodi Mourning after discharge, labs showed hemoglobin 7.5 on 06/20/2014, and he developed some chest tightness. He presented to emergency room again yesterday and was admitted. His hemoglobin was 7.2 in the ER, and dropped to 6.1 this morning after admission, and he received 2 units of blood transfusion today. He does feel much better after blood transfusion.  He reports 2-3 episode of dark urine (tea color), one before his prior to his first hospitalization, and one was last night at ED. No significant abdominal or frank pain, no fever or chills, no skin rash or lesions. he lost about 5lbs in the past month. Good  appetite. No neuro symptoms.   He was recently diagnosed with prostate cancer, with Gleason score 6 . This was discovered on screening PSA, and he underwent prostate biopsy. He is in the process being referred to see a urologist.   He denies family history of sickle cell disease or other inheritable anemia, except that his mother and one of his sisters are slightly anemic. He has 6 siblings. He is not sure the etiology of their anemia.   MEDICAL HISTORY:  Past Medical History  Diagnosis Date  . GERD (gastroesophageal reflux disease)   . Endocarditis 10-1999     RECURRENCE 02-2008  . History of chest tube placement 1980    R SIDE FOR SPONTANEOUS  PNEUMOTHORAX  . Periodontitis     PMH  . C. difficile colitis 2009  . External hemorrhoids without mention of complication 19/50/9326    Colonoscopy  . History of blood transfusion 02/2008, 04/2014.     for anemia.   . Prostate cancer     "5 of the samples from the biopsy was positive; Greeson # is 6"  . DDD (degenerative disc disease), lumbar   . Anemia 2009, 04/2014.     SURGICAL HISTORY: Past Surgical History  Procedure Laterality Date  . Colonoscopy  2005, 03/2012    external rrhoids 2005, normal exam 2013; [facility]  . Multiple tooth extractions  2009    "to prep for mitral valve repair"  . Mitral valve repair  2009    via a mini thoracotomy  . Prostate biopsy  03/2014  . Cardiac catheterization  2009  . Esophagogastroduodenoscopy N/A 06/02/2014    Procedure:  ESOPHAGOGASTRODUODENOSCOPY (EGD);  Surgeon: Jerene Bears, MD;  Location: [facility] ENDOSCOPY;  Service: Endoscopy;  Laterality: N/A;    SOCIAL HISTORY: History   Social History  . Marital Status: Married    Spouse Name: N/A  . Number of Children: N/A  . Years of Education: N/A   Occupational History  . 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 newspaper delivery     delivers papers to distributors, part time on weekends.    Social History Main Topics    . Smoking status: Former Smoker -- 2.00 packs/day for 15 years    Types: Cigarettes    Quit date: 08/08/1989  . Smokeless tobacco: Never Used  . Alcohol Use: 0.0 oz/week    0 Standard drinks or equivalent per week     Comment: "stopped drinking in 1991; never treated for abuse"  . Drug Use: Yes    Special: Cocaine, Heroin, Marijuana     Comment: "stopped using drugs in ~ 1984"  . Sexual Activity: No   Other Topics Concern  . 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 Ovarian cancer Sister   . Diabetes Neg Hx   . Heart disease Neg Hx   . Stroke Neg Hx     ALLERGIES:  is allergic to clarithromycin.  MEDICATIONS:  Current Facility-Administered Medications  Medication Dose Route Frequency Provider Last Rate Last Dose  . heparin injection 5,000 Units  5,000 Units Subcutaneous 3 times per day Ivor Costa, MD   5,000 Units at 06/22/14 1657  . loratadine (CLARITIN) tablet 10 mg  10 mg Oral Daily PRN Ivor Costa, MD      . morphine 2 MG/ML injection 2 mg  2 mg Intravenous Q3H PRN Ivor Costa, MD      . pantoprazole (PROTONIX) EC tablet 40 mg  40 mg Oral Daily Ivor Costa, MD   40 mg at 06/22/14 1100  . sodium chloride 0.9 % injection 3 mL  3 mL Intravenous Q12H Ivor Costa, MD   3 mL at 06/22/14 1100    REVIEW OF SYSTEMS:   Constitutional: Denies fevers, chills or abnormal night sweats Eyes: Denies blurriness of vision, double vision or watery eyes Ears, nose, mouth, throat, and face: Denies mucositis or sore throat Respiratory: Denies cough, dyspnea or wheezes Cardiovascular: Denies palpitation, chest discomfort or lower extremity swelling Gastrointestinal:  Denies nausea, heartburn or change in bowel habits Skin: Denies abnormal skin rashes Lymphatics: Denies new lymphadenopathy or easy bruising Neurological:Denies numbness, tingling or new weaknesses Behavioral/Psych: Mood is  stable, no new changes  All other systems were reviewed with the patient and are negative.  PHYSICAL EXAMINATION:  Filed Vitals:   06/22/14 1944  BP: 118/71  Pulse: 78  Temp: 97.9 F (36.6 C)  Resp: 20   Filed Weights   06/22/14 0020  Weight: 153 lb 7 oz (69.6 kg)    GENERAL:alert, no distress and comfortable SKIN: skin color, texture, turgor are normal, no rashes or significant lesions EYES: normal, conjunctiva are pink and non-injected, sclera clear OROPHARYNX:no exudate, no erythema and lips, buccal mucosa, and tongue normal  NECK: supple, thyroid normal size, non-tender, without nodularity LYMPH:  no palpable lymphadenopathy in the cervical, axillary or inguinal LUNGS: clear to auscultation and percussion with normal breathing effort HEART: regular rate & rhythm and no murmurs and no lower extremity edema ABDOMEN:abdomen  soft, non-tender and normal bowel sounds, no spleenomagely Musculoskeletal:no cyanosis of digits and no clubbing  PSYCH: alert & oriented x 3 with fluent speech NEURO: no focal motor/sensory deficits  LABORATORY DATA:  I have reviewed the data as listed CBC Latest Ref Rng 06/22/2014 06/22/2014 06/21/2014  WBC 4.0 - 10.5 K/uL 3.5(L) 4.0 5.0  Hemoglobin 13.0 - 17.0 g/dL 6.1(LL) 6.7(LL) 7.2(L)  Hematocrit 39.0 - 52.0 % 20.6(L) 22.1(L) 22.8(L)  Platelets 150 - 400 K/uL 196 217 219    CMP Latest Ref Rng 06/22/2014 06/21/2014 06/02/2014  Glucose 70 - 99 mg/dL 100(H) 103(H) 86  BUN 6 - 23 mg/dL 9 9 8   Creatinine 0.50 - 1.35 mg/dL 0.96 1.13 0.90  Sodium 135 - 145 mmol/L 140 139 139  Potassium 3.5 - 5.1 mmol/L 3.8 3.8 4.1  Chloride 96 - 112 mmol/L 107 104 108  CO2 19 - 32 mmol/L 25 28 25   Calcium 8.4 - 10.5 mg/dL 8.7 9.6 9.2  Total Protein 6.0 - 8.3 g/dL 5.7(L) 7.3 -  Total Bilirubin 0.3 - 1.2 mg/dL 0.8 1.6(H) -  Alkaline Phos 39 - 117 U/L 34(L) 46 -  AST 0 - 37 U/L 90(H) 126(H) -  ALT 0 - 53 U/L 29 36 -     RADIOGRAPHIC STUDIES: I have personally  reviewed the radiological images as listed and agreed with the findings in the report.  Ct chest, Abdomen Pelvis W Contrast 06/22/2014    IMPRESSION: 1. No evidence of lymphadenopathy. 2. 10 mm nodule in the right middle lobe. Given the size of this nodule, biopsy or follow-up PET-CT should be considered. If these not performed, short-term follow-up with repeat CT in 3 months is recommended. 3. Area of hypoattenuation in the pancreatic head that may be artifact from dense adjacent colonic contrast. A mass is not excluded, but felt less likely. This could also be the assessed on short-term followup CT or pancreatic MRI with and without contrast. 4. Mild wall thickening of the bladder. Consider cystitis if there are consistent symptoms. 5. Trace pelvic free fluid, nonspecific.   Electronically Signed   By: Lajean Manes M.D.   On: 06/22/2014 15:49      ASSESSMENT & PLAN:  54 year old African-American male, with spastic medical history of an acute diabetes status post module valve repair in 2009, recently diagnosed prostate cancer, presented with recurrent severe anemia required hospitalization.   1. Anemia -His anemia workup showed normal iron and TIBC, slightly low ferritin, normal E-52 and folic acid, no evidence of nutritional deficient anemia and GI workup was negative for bleeding.  -He has significant reticulocytosis, right high LDH, slightly elevated indirect bilirubin, which are very suspicious for hemolysis, haptoglobin level is still pending. -The etiology of hemostasis is uncertain, and differential is broad, including autoimmune hemolysis, PNH, hemoglobinopathy, such as thalassemia, sickle cell disease, spherocytosis, or RBC membrane enzyme deficiency deficiency such as G6PD deficiency, ec. Intravascular hemolysis secondary to his valve regurgitation or DIC are also posible -His anemia is microcytic (MCV in the low 70s), although he had normal MCV in the past. This is not consistent with  thalassemia -His posterior transverse fusion peripheral blood smear revealed polychromasia, Anisopoikilocytosis, significant schistocytes, and some tear drop and sickle cells, some changes  Could be related to blood transfusion.  -His physical exam and CT scan were negative for adenopathy. No evidence of lymphoma.  Recommendations: -I will take the liberty to order the following test: Fibrinogen, d-dimer, direct Coombs test, PNH profile, hemoglobin electrophoresis, G6PD, RBC osmotic  fragility, plasma hemoglobin, urine hemosiderin, porphyrins (some are not available in house, will do in our clinic next week)  -Agree with blood transfusion if Hb less than 7 -If his Hb stable after blood transfusion, ID work up negative, OK to discharge from hematology standpoint. I will follow him in my clinic next week.  -We briefly reviewed the treatment options for autoimmune hemolytic anemia, such as steroids.If his above workup all negative, I may empirically try a course of steroids.   2. Lung node: needs to be followed. 3. Newly diagnosed prostate cancer: follow up with urology   Please call me if you have questions.       Truitt Merle, MD 06/22/2014  [phone], or [phone]

## 2014-06-22 NOTE — Progress Notes (Addendum)
Pt's hgb=6.7, pt asymptomatic, Dr. Blaine Hamper notified and ordered 2 units of PRBC to be transfuse after pathologist smear review done, NS @ 125cc/hr ordered. Blood consent done.

## 2014-06-23 ENCOUNTER — Telehealth: Payer: Self-pay | Admitting: Hematology

## 2014-06-23 DIAGNOSIS — R911 Solitary pulmonary nodule: Secondary | ICD-10-CM | POA: Diagnosis present

## 2014-06-23 DIAGNOSIS — C61 Malignant neoplasm of prostate: Secondary | ICD-10-CM | POA: Diagnosis present

## 2014-06-23 DIAGNOSIS — N3090 Cystitis, unspecified without hematuria: Secondary | ICD-10-CM | POA: Diagnosis present

## 2014-06-23 LAB — CBC WITH DIFFERENTIAL/PLATELET
Basophils Absolute: 0 10*3/uL (ref 0.0–0.1)
Basophils Relative: 0 % (ref 0–1)
Eosinophils Absolute: 0.2 10*3/uL (ref 0.0–0.7)
Eosinophils Relative: 4 % (ref 0–5)
HCT: 29.2 % — ABNORMAL LOW (ref 39.0–52.0)
Hemoglobin: 9.5 g/dL — ABNORMAL LOW (ref 13.0–17.0)
Lymphocytes Relative: 32 % (ref 12–46)
Lymphs Abs: 1.3 10*3/uL (ref 0.7–4.0)
MCH: 24.8 pg — ABNORMAL LOW (ref 26.0–34.0)
MCHC: 32.5 g/dL (ref 30.0–36.0)
MCV: 76.2 fL — ABNORMAL LOW (ref 78.0–100.0)
Monocytes Absolute: 0.4 10*3/uL (ref 0.1–1.0)
Monocytes Relative: 9 % (ref 3–12)
Neutro Abs: 2.3 10*3/uL (ref 1.7–7.7)
Neutrophils Relative %: 55 % (ref 43–77)
Platelets: 230 10*3/uL (ref 150–400)
RBC: 3.83 MIL/uL — ABNORMAL LOW (ref 4.22–5.81)
RDW: 34.2 % — ABNORMAL HIGH (ref 11.5–15.5)
WBC: 4.2 10*3/uL (ref 4.0–10.5)

## 2014-06-23 LAB — TYPE AND SCREEN
ABO/RH(D): O POS
Antibody Screen: NEGATIVE
Unit division: 0
Unit division: 0
Unit division: 0
Unit division: 0

## 2014-06-23 LAB — D-DIMER, QUANTITATIVE: D-Dimer, Quant: 0.57 ug/mL-FEU — ABNORMAL HIGH (ref 0.00–0.48)

## 2014-06-23 LAB — COMPREHENSIVE METABOLIC PANEL
ALT: 28 U/L (ref 0–53)
AST: 80 U/L — ABNORMAL HIGH (ref 0–37)
Albumin: 3.6 g/dL (ref 3.5–5.2)
Alkaline Phosphatase: 39 U/L (ref 39–117)
Anion gap: 9 (ref 5–15)
BUN: 8 mg/dL (ref 6–23)
CO2: 25 mmol/L (ref 19–32)
Calcium: 9.5 mg/dL (ref 8.4–10.5)
Chloride: 104 mmol/L (ref 96–112)
Creatinine, Ser: 1.03 mg/dL (ref 0.50–1.35)
GFR calc Af Amer: >90 mL/min (ref >90)
GFR calc non Af Amer: 81 mL/min — ABNORMAL LOW (ref >90)
Glucose, Bld: 94 mg/dL (ref 70–99)
Potassium: 4 mmol/L (ref 3.5–5.1)
Sodium: 138 mmol/L (ref 135–145)
Total Bilirubin: 1.2 mg/dL (ref 0.3–1.2)
Total Protein: 6.3 g/dL (ref 6.0–8.3)

## 2014-06-23 LAB — SICKLE CELL SCREEN: Sickle Cell Screen: NEGATIVE

## 2014-06-23 LAB — DIRECT ANTIGLOBULIN TEST (NOT AT ARMC)
DAT, IgG: NEGATIVE
DAT, complement: NEGATIVE

## 2014-06-23 LAB — FIBRINOGEN: Fibrinogen: 337 mg/dL (ref 204–475)

## 2014-06-23 LAB — HAPTOGLOBIN: Haptoglobin: <10 mg/dL — ABNORMAL LOW (ref 34–200)

## 2014-06-23 LAB — HIV ANTIBODY (ROUTINE TESTING W REFLEX): HIV Screen 4th Generation wRfx: NONREACTIVE

## 2014-06-23 NOTE — Discharge Summary (Signed)
David [name], is a 54 y.o. male  DOB 01-Aug-1960  MRN [id number].  Admission date:  06/21/2014  Admitting Physician  Ivor Costa, MD  Discharge Date:  06/23/2014   Primary MD  Unice Cobble, MD  Recommendations for primary care physician for things to follow:   Please follow right lung nodule  Refer to cardiology/cardiothoracic for mitral valve regurgitation  Follow hemolytic anemia    Admission Diagnosis   Symptomatic anemia [D64.9]   Discharge Diagnosis  Symptomatic anemia [D64.9]   Principal Problem:   Symptomatic anemia Active Problems:   Mitral valve disorder   GERD   Prostate cancer   Cystitis   Malignant neoplasm of prostate   Nodule of right lung      Hospital Course  David [name] is a pleasant 54 y.o. male with past medical history significant for endocarditis, status post mitral valve repair 2009, hyperlipidemia, GERD, recently diagnosed prostate cancer, hospitalized last month for symptomatic anemia status post endoscopy earlier this month(followed by Dr Hilarie Fredrickson, supposed to have capsule endoscopy outpatient), who presented with drop in hemoglobin and his labwork raised possibility of hemolysis. He was seen by Dr. Burr Medico who requested hemolytic anemia workup whose results are pending at the time of discharge. He was transfused 2 units PRBC and his hemoglobin improved from 6.1 g/dl to 9.5 g/dl. He also had CT chest/abdomen/pelvis with contrast to assess for lymphadenopathy, and the CT showed "1. No evidence of lymphadenopathy. 2. 10 mm nodule in the right middle lobe. Given the size of this nodule, biopsy or follow-up PET-CT should be considered. If these not performed, short-term follow-up with repeat CT in 3 months is recommended. 3. Area of hypoattenuation in the pancreatic head that may be  artifact from dense adjacent colonic contrast. A mass is not excluded, but felt less likely. This could also be the assessed on short-term followup CT or pancreatic MRI with and without contrast. 4. Mild wall thickening of the bladder. Consider cystitis if there are consistent symptoms. 5. Trace pelvic free fluid, nonspecific". 2-D echocardiogram showed "Mitral valve: Prior procedures included surgical repair. There was severe regurgitation. Valve area by continuity equation (using LVOT flow): 1.02 cm^2", among other findings. Patient is to follow with hematology/cardiology/urology. He is likely to have recurrence of symptomatic anemia and to the cause of hemolysis has been addressed. I have explained this to him. He should still follow with GI regarding capsule endoscopy. He seems to be back to his baseline and will discharge today. There was suggestion of cystitis on CT abdomen but urinalysis did not suggest infection.   Discharge Condition Stable.  Consults obtained  None  Follow UP Hematology Urology Cardiology Cardiothoracic surgery   Discharge Instructions  and  Discharge Medications      Discharge Instructions    Diet - low sodium heart healthy    Complete by:  As directed      Increase activity slowly    Complete by:  As directed  Medication List    TAKE these medications        loratadine 10 MG tablet  Commonly known as:  CLARITIN  Take 1 tablet (10 mg total) by mouth daily.     pantoprazole 40 MG tablet  Commonly known as:  PROTONIX  Take 1 tablet (40 mg total) by mouth daily.     tadalafil 20 MG tablet  Commonly known as:  CIALIS  1/2- 1 every 3 days as needed        Diet and Activity recommendation: See Discharge Instructions above  Major procedures and Radiology Reports - PLEASE review detailed and final reports for all details, in brief -    Dg Chest 2 View  06/21/2014   CLINICAL DATA:  Chest pain for 1 day.  EXAM: CHEST  2 VIEW   COMPARISON:  May 31, 2014.  FINDINGS: The heart size and mediastinal contours are within normal limits. No pneumothorax or plural effusion is noted. Status post cardiac valve repair. Both lungs are clear. The visualized skeletal structures are unremarkable.  IMPRESSION: No acute cardiopulmonary abnormality seen.   Electronically Signed   By: Marijo Conception, M.D.   On: 06/21/2014 21:06   Dg Chest 2 View  06/01/2014   CLINICAL DATA:  Shortness of breath with exertion for 1 month.  EXAM: CHEST  2 VIEW  COMPARISON:  05/14/2011  FINDINGS: Prior valve replacement. Heart and mediastinal contours are within normal limits. No focal opacities or effusions. No acute bony abnormality.  IMPRESSION: Heart and mediastinal contours are within normal limits. No focal opacities or effusions. No acute bony abnormality. No active cardiopulmonary disease.   Electronically Signed   By: Rolm Baptise M.D.   On: 06/01/2014 09:14   Ct Chest W Contrast  06/22/2014   CLINICAL DATA:  Patient initially presented with a drop in his hemoglobin with laboratory work suggesting possible hemoptysis. Patient has reported some night sweats. There is clinical concern for a hematologic malignancy. Current CT is to assess for possible lymphadenopathy.  EXAM: CT CHEST, ABDOMEN, AND PELVIS WITH CONTRAST  TECHNIQUE: Multidetector CT imaging of the chest, abdomen and pelvis was performed following the standard protocol during bolus administration of intravenous contrast.  CONTRAST:  173mL OMNIPAQUE IOHEXOL 300 MG/ML  SOLN  COMPARISON:  None.  FINDINGS: CT CHEST FINDINGS  Thoracic inlet:  No neck base or axillary masses.  No adenopathy.  Mediastinum and hila: Changes from mitral valve replacement. Mild left atrial enlargement. Overall heart size normal. No mediastinal or hilar masses or pathologically enlarged lymph nodes.  Lungs and pleura: 10 mm nodule in the peripheral right middle lobe just below the minor fissure. Tiny peripheral nodule in the  left lower lobe, image 53, series 3. There is peripheral reticular opacity in the inferior right upper lobe, right middle lobe and both posterior lateral lower lobes. This is likely combination of mild interstitial scarring and subsegmental atelectasis. Minimal pleural effusions are noted. No pneumothorax.  CT ABDOMEN AND PELVIS FINDINGS  Normal liver, spleen and gallbladder.  No bile duct dilation.  Low-attenuation areas into pancreatic head without a convincing discrete mass. This may be due to beam hardening artifact from adjacent dense colonic contrast. No other evidence of a pancreatic abnormality.  Normal adrenal glands. Normal kidneys and ureters. Bladder mildly thick-walled. No bladder mass or stone.  Trace free fluid noted in the posterior pelvis.  No pathologically enlarged intraperitoneal or retroperitoneal lymph nodes.  No bowel wall thickening or inflammatory changes. No bowel dilation.  Normal appendix visualized.  MUSCULOSKELETAL  No osteoblastic or osteolytic lesions. Degenerative changes of the lumbar spine most evident at L5-S1  IMPRESSION: 1. No evidence of lymphadenopathy. 2. 10 mm nodule in the right middle lobe. Given the size of this nodule, biopsy or follow-up PET-CT should be considered. If these not performed, short-term follow-up with repeat CT in 3 months is recommended. 3. Area of hypoattenuation in the pancreatic head that may be artifact from dense adjacent colonic contrast. A mass is not excluded, but felt less likely. This could also be the assessed on short-term followup CT or pancreatic MRI with and without contrast. 4. Mild wall thickening of the bladder. Consider cystitis if there are consistent symptoms. 5. Trace pelvic free fluid, nonspecific.   Electronically Signed   By: Lajean Manes M.D.   On: 06/22/2014 15:49   Ct Abdomen Pelvis W Contrast  06/22/2014   CLINICAL DATA:  Patient initially presented with a drop in his hemoglobin with laboratory work suggesting possible  hemoptysis. Patient has reported some night sweats. There is clinical concern for a hematologic malignancy. Current CT is to assess for possible lymphadenopathy.  EXAM: CT CHEST, ABDOMEN, AND PELVIS WITH CONTRAST  TECHNIQUE: Multidetector CT imaging of the chest, abdomen and pelvis was performed following the standard protocol during bolus administration of intravenous contrast.  CONTRAST:  188mL OMNIPAQUE IOHEXOL 300 MG/ML  SOLN  COMPARISON:  None.  FINDINGS: CT CHEST FINDINGS  Thoracic inlet:  No neck base or axillary masses.  No adenopathy.  Mediastinum and hila: Changes from mitral valve replacement. Mild left atrial enlargement. Overall heart size normal. No mediastinal or hilar masses or pathologically enlarged lymph nodes.  Lungs and pleura: 10 mm nodule in the peripheral right middle lobe just below the minor fissure. Tiny peripheral nodule in the left lower lobe, image 53, series 3. There is peripheral reticular opacity in the inferior right upper lobe, right middle lobe and both posterior lateral lower lobes. This is likely combination of mild interstitial scarring and subsegmental atelectasis. Minimal pleural effusions are noted. No pneumothorax.  CT ABDOMEN AND PELVIS FINDINGS  Normal liver, spleen and gallbladder.  No bile duct dilation.  Low-attenuation areas into pancreatic head without a convincing discrete mass. This may be due to beam hardening artifact from adjacent dense colonic contrast. No other evidence of a pancreatic abnormality.  Normal adrenal glands. Normal kidneys and ureters. Bladder mildly thick-walled. No bladder mass or stone.  Trace free fluid noted in the posterior pelvis.  No pathologically enlarged intraperitoneal or retroperitoneal lymph nodes.  No bowel wall thickening or inflammatory changes. No bowel dilation. Normal appendix visualized.  MUSCULOSKELETAL  No osteoblastic or osteolytic lesions. Degenerative changes of the lumbar spine most evident at L5-S1  IMPRESSION: 1. No  evidence of lymphadenopathy. 2. 10 mm nodule in the right middle lobe. Given the size of this nodule, biopsy or follow-up PET-CT should be considered. If these not performed, short-term follow-up with repeat CT in 3 months is recommended. 3. Area of hypoattenuation in the pancreatic head that may be artifact from dense adjacent colonic contrast. A mass is not excluded, but felt less likely. This could also be the assessed on short-term followup CT or pancreatic MRI with and without contrast. 4. Mild wall thickening of the bladder. Consider cystitis if there are consistent symptoms. 5. Trace pelvic free fluid, nonspecific.   Electronically Signed   By: Lajean Manes M.D.   On: 06/22/2014 15:49    Micro Results   Recent Results (  from the past 240 hour(s))  CULTURE, URINE COMPREHENSIVE     Status: None   Collection Time: 06/20/14  5:22 PM  Result Value Ref Range Status   Colony Count NO GROWTH  Final   Organism ID, Bacteria NO GROWTH  Final       Today   Subjective:   David [name] today has no headache,no chest abdominal pain,no new weakness tingling or numbness, feels much better wants to go home today.   Objective:   Blood pressure 109/71, pulse 72, temperature 98.6 F (37 C), temperature source Oral, resp. rate 18, height 5\' 10"  (1.778 m), weight 69.6 kg (153 lb 7 oz), SpO2 100 %.   Intake/Output Summary (Last 24 hours) at 06/23/14 0842 Last data filed at 06/22/14 2219  Gross per 24 hour  Intake  555.5 ml  Output      0 ml  Net  555.5 ml    Exam Awake Alert, Oriented x 3, No new F.N deficits, Normal affect .AT,PERRAL Supple Neck,No JVD, No cervical lymphadenopathy appriciated.  Symmetrical Chest wall movement, Good air movement bilaterally, CTAB RRR,No Gallops,Rubs or new Murmurs, No Parasternal Heave +ve B.Sounds, Abd Soft, Non tender, No organomegaly appriciated, No rebound -guarding or rigidity. No Cyanosis, Clubbing or edema, No new Rash or bruise  Data Review    CBC w Diff:  Lab Results  Component Value Date   WBC 4.2 06/23/2014   HGB 9.5* 06/23/2014   HCT 29.2* 06/23/2014   PLT PENDING 06/23/2014   LYMPHOPCT 32 06/23/2014   MONOPCT 9 06/23/2014   EOSPCT 4 06/23/2014   BASOPCT 0 06/23/2014    CMP:  Lab Results  Component Value Date   NA 138 06/23/2014   K 4.0 06/23/2014   CL 104 06/23/2014   CO2 25 06/23/2014   BUN 8 06/23/2014   CREATININE 1.03 06/23/2014   PROT 6.3 06/23/2014   ALBUMIN 3.6 06/23/2014   BILITOT 1.2 06/23/2014   ALKPHOS 39 06/23/2014   AST 80* 06/23/2014   ALT 28 06/23/2014  .   Total Time in preparing paper work, data evaluation and todays exam - 35 minutes  Ambriel Gorelick M.D on 06/23/2014 at 8:42 AM  [facility] Office  [phone]

## 2014-06-23 NOTE — Telephone Encounter (Signed)
Left message returning call in reguards to appointment. Left message confirm patient already schedule for 03/03.

## 2014-06-24 ENCOUNTER — Telehealth: Payer: Self-pay | Admitting: Hematology

## 2014-06-24 LAB — HEMOSIDERIN, URINE

## 2014-06-24 NOTE — Telephone Encounter (Signed)
Called pt to remind him of appt. On 06/30/14 with Dr. Burr Medico.  He is aware.

## 2014-06-25 LAB — HEMOGLOBIN FREE, PLASMA: Hgb, Plasma: 62.9 mg/dL — ABNORMAL HIGH (ref 0.0–8.3)

## 2014-06-28 ENCOUNTER — Telehealth: Payer: Self-pay | Admitting: Hematology

## 2014-06-28 ENCOUNTER — Other Ambulatory Visit: Payer: Self-pay | Admitting: *Deleted

## 2014-06-28 ENCOUNTER — Ambulatory Visit: Payer: 59

## 2014-06-28 ENCOUNTER — Ambulatory Visit (HOSPITAL_BASED_OUTPATIENT_CLINIC_OR_DEPARTMENT_OTHER): Payer: 59 | Admitting: Hematology

## 2014-06-28 ENCOUNTER — Ambulatory Visit (HOSPITAL_BASED_OUTPATIENT_CLINIC_OR_DEPARTMENT_OTHER): Payer: 59

## 2014-06-28 VITALS — BP 132/74 | HR 77 | Temp 98.5°F | Resp 18 | Ht 70.0 in | Wt 155.9 lb

## 2014-06-28 DIAGNOSIS — D589 Hereditary hemolytic anemia, unspecified: Secondary | ICD-10-CM

## 2014-06-28 DIAGNOSIS — D599 Acquired hemolytic anemia, unspecified: Secondary | ICD-10-CM

## 2014-06-28 DIAGNOSIS — D649 Anemia, unspecified: Secondary | ICD-10-CM

## 2014-06-28 DIAGNOSIS — C61 Malignant neoplasm of prostate: Secondary | ICD-10-CM

## 2014-06-28 LAB — CBC & DIFF AND RETIC
BASO%: 0.7 % (ref 0.0–2.0)
Basophils Absolute: 0 10*3/uL (ref 0.0–0.1)
EOS%: 1.6 % (ref 0.0–7.0)
Eosinophils Absolute: 0.1 10*3/uL (ref 0.0–0.5)
HCT: 28.4 % — ABNORMAL LOW (ref 38.4–49.9)
HGB: 8.9 g/dL — ABNORMAL LOW (ref 13.0–17.1)
Immature Retic Fract: 34.2 % — ABNORMAL HIGH (ref 3.00–10.60)
LYMPH%: 31.7 % (ref 14.0–49.0)
MCH: 24.4 pg — ABNORMAL LOW (ref 27.2–33.4)
MCHC: 31.3 g/dL — ABNORMAL LOW (ref 32.0–36.0)
MCV: 77.8 fL — ABNORMAL LOW (ref 79.3–98.0)
MONO#: 0.4 10*3/uL (ref 0.1–0.9)
MONO%: 9.3 % (ref 0.0–14.0)
NEUT#: 2.5 10*3/uL (ref 1.5–6.5)
NEUT%: 56.7 % (ref 39.0–75.0)
Platelets: 177 10*3/uL (ref 140–400)
RBC: 3.65 10*6/uL — ABNORMAL LOW (ref 4.20–5.82)
RDW: 35.3 % — ABNORMAL HIGH (ref 11.0–14.6)
Retic %: 5.94 % — ABNORMAL HIGH (ref 0.80–1.80)
Retic Ct Abs: 216.81 10*3/uL — ABNORMAL HIGH (ref 34.80–93.90)
WBC: 4.3 10*3/uL (ref 4.0–10.3)
lymph#: 1.4 10*3/uL (ref 0.9–3.3)
nRBC: 0 % (ref 0–0)

## 2014-06-28 LAB — CULTURE, BLOOD (ROUTINE X 2)
Culture: NO GROWTH
Culture: NO GROWTH

## 2014-06-28 LAB — MORPHOLOGY: PLT EST: ADEQUATE

## 2014-06-28 LAB — COMPREHENSIVE METABOLIC PANEL (CC13)
ALT: 25 U/L (ref 0–55)
AST: 110 U/L — ABNORMAL HIGH (ref 5–34)
Albumin: 4.2 g/dL (ref 3.5–5.0)
Alkaline Phosphatase: 45 U/L (ref 40–150)
Anion Gap: 11 mEq/L (ref 3–11)
BUN: 10.1 mg/dL (ref 7.0–26.0)
CO2: 24 mEq/L (ref 22–29)
Calcium: 9.3 mg/dL (ref 8.4–10.4)
Chloride: 104 mEq/L (ref 98–109)
Creatinine: 1 mg/dL (ref 0.7–1.3)
EGFR: >90 mL/min/{1.73_m2} (ref >90)
Glucose: 83 mg/dl (ref 70–140)
Potassium: 3.8 mEq/L (ref 3.5–5.1)
Sodium: 138 mEq/L (ref 136–145)
Total Bilirubin: 2.03 mg/dL — ABNORMAL HIGH (ref 0.20–1.20)
Total Protein: 7.3 g/dL (ref 6.4–8.3)

## 2014-06-28 LAB — HEMOGLOBINOPATHY EVALUATION
Hgb A2 Quant: 2.5 % (ref 0.7–3.1)
Hgb A: 97.5 % (ref 94.0–98.0)
Hgb C: 0 %
Hgb F Quant: 0 % (ref 0.0–2.0)
Hgb S Quant: 0 %

## 2014-06-28 LAB — LACTATE DEHYDROGENASE (CC13): LDH: 2668 U/L — ABNORMAL HIGH (ref 125–245)

## 2014-06-28 LAB — FERRITIN CHCC: Ferritin: 43 ng/ml (ref 22–316)

## 2014-06-28 LAB — DRAW EXTRA CLOT TUBE

## 2014-06-28 MED ORDER — PREDNISONE 10 MG PO TABS: 70.0000 mg | ORAL_TABLET | Freq: Every day | ORAL | Status: DC

## 2014-06-28 NOTE — Progress Notes (Signed)
[place]  Telephone:([phone]) [phone] Fax:([phone]) [place] consult Note   Patient Care Team: Hendricks Limes, MD as PCP - General 06/28/2014  CHIEF COMPLAINTS/PURPOSE OF CONSULTATION:  Anemia, follow-up after hospital discharge  ISTORY OF PRESENTING ILLNESS:  David [name] 54 y.o. male with past medical history significant for endocarditis, status post mitral valve repair 2009, hyperlipidemia, GERD, recently diagnosed prostate cancer, is here because of recently diagnosed hemolytic anemia. I first met him when he was admitted to the hospital last week, and he is here for follow-up.  He was hospitalized in 2009 for endocarditis, and underwent module of repair. He had severe anemia during that hospitalization, and he received 3 units of blood transfusion. He recovered well, and has been slightly anemic with hemoglobin around 11-12, the patient. He developed some fatigue, mild night sweats 3 weeks ago, he was evaluated by his primary care physician Dr. [name]. Routine lab test reviewed hemoglobin 5.9, MCV 70.6, normal WAC and a platelet count, he was referred to emergency room and was admitted. He underwent EGD and colonoscopy, which showed mild to gastropathy biopsy was negative. Colonoscopy was normal. He received 2 units of RBC, And was discharged home on 06/07/2014 with hemoglobin 8.7. He followed up with Dr. Jodi Mourning after discharge, labs showed hemoglobin 7.5 on 06/20/2014, and he developed some chest tightness. He presented to emergency room again on 2/23 and was admitted. His hemoglobin was 7.2 in the ER, and dropped to 6.1 this morning after admission, and he received 2 units of blood transfusion today. He does feel much better after blood transfusion.  He reports 2-3 episode of dark urine (tea color) in the past 3 weeks. No significant abdominal or frank pain, no fever or chills, no skin rash or lesions. he lost about 5lbs in the past month. Good appetite. No neuro  symptoms.   He was recently diagnosed with prostate cancer, with Gleason score 6 . This was discovered on screening PSA, and he underwent prostate biopsy. He is in the process being referred to see a urologist.   He denies family history of sickle cell disease or other inheritable anemia, except that his mother and one of his sisters are slightly anemic. He has 6 siblings. He is not sure the etiology of their anemia.   He has been taking Krill in the past one year, and some glucosomine for the past few month, and prostate health (vitamine supplement), no other urbers or supplement.   He was discharged to home on 06/23/2014. He feels well overall and has been back to work.  He noticed one episode of dark urine, no other complaints.  MEDICAL HISTORY:  Past Medical History  Diagnosis Date  . GERD (gastroesophageal reflux disease)   . Endocarditis 10-1999     RECURRENCE 02-2008  . History of chest tube placement 1980    R SIDE FOR SPONTANEOUS  PNEUMOTHORAX  . Periodontitis     PMH  . C. difficile colitis 2009  . External hemorrhoids without mention of complication 60/63/0160    Colonoscopy  . History of blood transfusion 02/2008, 04/2014.     for anemia.   . Prostate cancer     "5 of the samples from the biopsy was positive; Greeson # is 6"  . DDD (degenerative disc disease), lumbar   . Anemia 2009, 04/2014.     SURGICAL HISTORY: Past Surgical History  Procedure Laterality Date  . Colonoscopy  2005, 03/2012    external rrhoids 2005,  normal exam 2013; [facility]  . Multiple tooth extractions  2009    "to prep for mitral valve repair"  . Mitral valve repair  2009    via a mini thoracotomy  . Prostate biopsy  03/2014  . Cardiac catheterization  2009  . Esophagogastroduodenoscopy N/A 06/02/2014    Procedure: ESOPHAGOGASTRODUODENOSCOPY (EGD);  Surgeon: Jerene Bears, MD;  [facility] ENDOSCOPY;  Service: Endoscopy;  Laterality: N/A;    SOCIAL HISTORY: History   Social History  .  Marital Status: Married    Spouse Name: N/A  . Number of Children: N/A  . Years of Education: N/A   Occupational History  . 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 newspaper delivery     delivers papers to distributors, part time on weekends.    Social History Main Topics  . Smoking status: Former Smoker -- 2.00 packs/day for 15 years    Types: Cigarettes    Quit date: 08/08/1989  . Smokeless tobacco: Never Used  . Alcohol Use: 0.0 oz/week    0 Standard drinks or equivalent per week     Comment: "stopped drinking in 1991; never treated for abuse"  . Drug Use: Yes    Special: Cocaine, Heroin, Marijuana, denies IV drug      Comment: "stopped using drugs in ~ 1984"  . Sexual Activity: No   Other Topics Concern  . 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 Ovarian cancer Sister   . Diabetes Neg Hx   . Heart disease Neg Hx   . Stroke Neg Hx     ALLERGIES:  is allergic to clarithromycin.  MEDICATIONS:  Current Outpatient Prescriptions  Medication Sig Dispense Refill  . loratadine (CLARITIN) 10 MG tablet Take 1 tablet (10 mg total) by mouth daily. (Patient taking differently: Take 10 mg by mouth daily as needed. ) 30 tablet 3  . pantoprazole (PROTONIX) 40 MG tablet Take 1 tablet (40 mg total) by mouth daily. (Patient taking differently: Take 40 mg by mouth daily as needed. ) 30 tablet 0  . tadalafil (CIALIS) 20 MG tablet 1/2- 1 every 3 days as needed (Patient not taking: Reported on 06/20/2014) 5 tablet 11   No current facility-administered medications for this visit.    REVIEW OF SYSTEMS:   Constitutional: Denies fevers, chills or abnormal night sweats Eyes: Denies blurriness of vision, double vision or watery eyes Ears, nose, mouth, throat, and face: Denies mucositis or sore throat Respiratory: Denies cough, dyspnea or  wheezes Cardiovascular: Denies palpitation, chest discomfort or lower extremity swelling Gastrointestinal:  Denies nausea, heartburn or change in bowel habits Skin: Denies abnormal skin rashes Lymphatics: Denies new lymphadenopathy or easy bruising Neurological:Denies numbness, tingling or new weaknesses Behavioral/Psych: Mood is stable, no new changes  All other systems were reviewed with the patient and are negative.  PHYSICAL EXAMINATION: ECOG PERFORMANCE STATUS: 0 - Asymptomatic  Filed Vitals:   06/28/14 1228  BP: 132/74  Pulse: 77  Temp: 98.5 F (36.9 C)  Resp: 18   Filed Weights   06/28/14 1228  Weight: 155 lb 14.4 oz (70.716 kg)    GENERAL:alert, no distress and comfortable SKIN: skin color, texture, turgor are normal, no rashes or significant lesions EYES: normal, conjunctiva are pink and non-injected, sclera clear OROPHARYNX:no exudate, no erythema and lips, buccal mucosa, and tongue normal  NECK:  supple, thyroid normal size, non-tender, without nodularity LYMPH:  no palpable lymphadenopathy in the cervical, axillary or inguinal LUNGS: clear to auscultation and percussion with normal breathing effort HEART: regular rate & rhythm and no murmurs and no lower extremity edema ABDOMEN:abdomen soft, non-tender and normal bowel sounds Musculoskeletal:no cyanosis of digits and no clubbing  PSYCH: alert & oriented x 3 with fluent speech NEURO: no focal motor/sensory deficits  LABORATORY DATA:  I have reviewed the data as listed CBC Latest Ref Rng 06/28/2014 06/23/2014 06/22/2014  WBC 4.0 - 10.3 10e3/uL 4.3 4.2 3.5(L)  Hemoglobin 13.0 - 17.1 g/dL 8.9(L) 9.5(L) 6.1(LL)  Hematocrit 38.4 - 49.9 % 28.4(L) 29.2(L) 20.6(L)  Platelets 140 - 400 10e3/uL 177 230 196    CMP Latest Ref Rng 06/23/2014 06/22/2014 06/21/2014  Glucose 70 - 99 mg/dL 94 100(H) 103(H)  BUN 6 - 23 mg/dL '8 9 9  ' Creatinine 0.50 - 1.35 mg/dL 1.03 0.96 1.13  Sodium 135 - 145 mmol/L 138 140 139  Potassium 3.5 -  5.1 mmol/L 4.0 3.8 3.8  Chloride 96 - 112 mmol/L 104 107 104  CO2 19 - 32 mmol/L '25 25 28  ' Calcium 8.4 - 10.5 mg/dL 9.5 8.7 9.6  Total Protein 6.0 - 8.3 g/dL 6.3 5.7(L) 7.3  Total Bilirubin 0.3 - 1.2 mg/dL 1.2 0.8 1.6(H)  Alkaline Phos 39 - 117 U/L 39 34(L) 46  AST 0 - 37 U/L 80(H) 90(H) 126(H)  ALT 0 - 53 U/L 28 29 36     RADIOGRAPHIC STUDIES: I have personally reviewed the radiological images as listed and agreed with the findings in the report.  Ct chest, Abdomen Pelvis W Contrast 06/22/2014   IMPRESSION: 1. No evidence of lymphadenopathy. 2. 10 mm nodule in the right middle lobe. Given the size of this nodule, biopsy or follow-up PET-CT should be considered. If these not performed, short-term follow-up with repeat CT in 3 months is recommended. 3. Area of hypoattenuation in the pancreatic head that may be artifact from dense adjacent colonic contrast. A mass is not excluded, but felt less likely. This could also be the assessed on short-term followup CT or pancreatic MRI with and without contrast. 4. Mild wall thickening of the bladder. Consider cystitis if there are consistent symptoms. 5. Trace pelvic free fluid, nonspecific.   Electronically Signed   By: Lajean Manes M.D.   On: 06/22/2014 15:49    ASSESSMENT & PLAN:  54 year old African-American male, with past medical history of endocarditis in 2001 and 2009 status post mitral valve repair in 2009, recently diagnosed prostate cancer, presented with recurrent severe anemia required hospitalization.   1. Hemolytic Anemia -His anemia workup showed normal iron and TIBC, slightly low ferritin, normal G-29 and folic acid, no evidence of nutritional deficient anemia and GI workup was negative for bleeding.  -He has significant reticulocytosis, right high LDH, slightly elevated indirect bilirubin, low haptoglobin, peripheral smear showed abundant schistocytes, occasional target cells, his presentation is consistent with hemolysis. -Coombs  test was negative, autoimmune hemolysis is less likely but possible -The etiology of hemostasis is uncertain, and differential is broad, including Coombs negative autoimmune hemolysis, PNH, hemoglobinopathy, such as thalassemia, sickle cell disease, spherocytosis, or RBC membrane enzyme deficiency deficiency such as G6PD deficiency, ect. Intravascular hemolysis secondary to his valve regurgitation or DIC are also possible, but the DIC lab was not supportive.  -His anemia is microcytic (MCV in the low 70s), although he had normal MCV in the past. This is not consistent with thalassemia -No  prior exposure to medication or purpose except Krill. I told him to stop all supplements. -His physical exam and CT scan were negative for adenopathy. No evidence of lymphoma -I recommend to start steroids with prednisone 70 mg once daily for possible Coumadin negative autoimmune hemolysis, while waiting for the lab results for PNH, G6PD, hemoglobinopathy evaluation. Potential side effects from prednisone were explained to patient he agrees to proceed. -Continue PPI when he is on prednisone -Lab CBC twice a week for the next few weeks -I'll see him back next Friday   All questions were answered. The patient knows to call the clinic with any problems, questions or concerns. I spent 30 minutes counseling the patient face to face. The total time spent in the appointment was 40 minutes and more than 50% was on counseling.     Truitt Merle, MD 06/28/2014 12:41 PM

## 2014-06-28 NOTE — Telephone Encounter (Signed)
gv and printed appt sched and avs for pt for march  °

## 2014-06-29 ENCOUNTER — Encounter: Payer: Self-pay | Admitting: Hematology

## 2014-06-29 DIAGNOSIS — D589 Hereditary hemolytic anemia, unspecified: Secondary | ICD-10-CM

## 2014-06-29 HISTORY — DX: Hereditary hemolytic anemia, unspecified: D58.9

## 2014-06-30 ENCOUNTER — Ambulatory Visit: Payer: 59

## 2014-06-30 ENCOUNTER — Inpatient Hospital Stay: Payer: 59 | Admitting: Hematology

## 2014-07-01 ENCOUNTER — Other Ambulatory Visit (HOSPITAL_BASED_OUTPATIENT_CLINIC_OR_DEPARTMENT_OTHER): Payer: 59

## 2014-07-01 ENCOUNTER — Other Ambulatory Visit: Payer: Self-pay | Admitting: Hematology

## 2014-07-01 ENCOUNTER — Telehealth: Payer: Self-pay | Admitting: *Deleted

## 2014-07-01 DIAGNOSIS — C61 Malignant neoplasm of prostate: Secondary | ICD-10-CM

## 2014-07-01 DIAGNOSIS — D599 Acquired hemolytic anemia, unspecified: Secondary | ICD-10-CM

## 2014-07-01 DIAGNOSIS — D589 Hereditary hemolytic anemia, unspecified: Secondary | ICD-10-CM

## 2014-07-01 LAB — CBC & DIFF AND RETIC
BASO%: 0.3 % (ref 0.0–2.0)
Basophils Absolute: 0 10*3/uL (ref 0.0–0.1)
EOS%: 0.2 % (ref 0.0–7.0)
Eosinophils Absolute: 0 10*3/uL (ref 0.0–0.5)
HCT: 27.8 % — ABNORMAL LOW (ref 38.4–49.9)
HGB: 8.6 g/dL — ABNORMAL LOW (ref 13.0–17.1)
Immature Retic Fract: 44.6 % — ABNORMAL HIGH (ref 3.00–10.60)
LYMPH%: 26.6 % (ref 14.0–49.0)
MCH: 24.5 pg — ABNORMAL LOW (ref 27.2–33.4)
MCHC: 30.9 g/dL — ABNORMAL LOW (ref 32.0–36.0)
MCV: 79.2 fL — ABNORMAL LOW (ref 79.3–98.0)
MONO#: 0.7 10*3/uL (ref 0.1–0.9)
MONO%: 8.2 % (ref 0.0–14.0)
NEUT#: 5.8 10*3/uL (ref 1.5–6.5)
NEUT%: 64.7 % (ref 39.0–75.0)
Platelets: 220 10*3/uL (ref 140–400)
RBC: 3.51 10*6/uL — ABNORMAL LOW (ref 4.20–5.82)
RDW: 37.4 % — ABNORMAL HIGH (ref 11.0–14.6)
Retic %: 8.25 % — ABNORMAL HIGH (ref 0.80–1.80)
Retic Ct Abs: 289.58 10*3/uL — ABNORMAL HIGH (ref 34.80–93.90)
WBC: 9 10*3/uL (ref 4.0–10.3)
lymph#: 2.4 10*3/uL (ref 0.9–3.3)
nRBC: 2 % — ABNORMAL HIGH (ref 0–0)

## 2014-07-01 LAB — COMPREHENSIVE METABOLIC PANEL (CC13)
ALT: 31 U/L (ref 0–55)
AST: 125 U/L — ABNORMAL HIGH (ref 5–34)
Albumin: 4.1 g/dL (ref 3.5–5.0)
Alkaline Phosphatase: 45 U/L (ref 40–150)
Anion Gap: 12 mEq/L — ABNORMAL HIGH (ref 3–11)
BUN: 11.7 mg/dL (ref 7.0–26.0)
CO2: 24 mEq/L (ref 22–29)
Calcium: 9.6 mg/dL (ref 8.4–10.4)
Chloride: 104 mEq/L (ref 98–109)
Creatinine: 1.1 mg/dL (ref 0.7–1.3)
EGFR: 88 mL/min/{1.73_m2} — ABNORMAL LOW (ref >90)
Glucose: 87 mg/dl (ref 70–140)
Potassium: 3 mEq/L — CL (ref 3.5–5.1)
Sodium: 140 mEq/L (ref 136–145)
Total Bilirubin: 2 mg/dL — ABNORMAL HIGH (ref 0.20–1.20)
Total Protein: 7.1 g/dL (ref 6.4–8.3)

## 2014-07-01 LAB — TECHNOLOGIST REVIEW

## 2014-07-01 MED ORDER — POTASSIUM CHLORIDE CRYS ER 20 MEQ PO TBCR: 40.0000 meq | EXTENDED_RELEASE_TABLET | Freq: Two times a day (BID) | ORAL | Status: DC

## 2014-07-01 NOTE — Telephone Encounter (Signed)
Dr. Burr Medico reviewed lab results today.  K+  3.0 ; spoke with pt on cell phone and informed pt of low potassium level.  Instructed pt to start taking K-dur 40 meq by mouth daily  X  2 weeks as per md.  Gave pt suggestions to eat dry apricots, banana, baked potato with skin on ,  and drink orange juice to help with potassium intake.  Pt voiced understanding.

## 2014-07-04 LAB — PORPHYRINS, FRACTIONATION-PLASMA
Coproporphyrin.: 1.4 mcg/L — ABNORMAL HIGH
Hexacarboxyl Porphyrins: 0.5 mcg/L — ABNORMAL HIGH
Pentacarboxyl Porphyrins: 0.5 mcg/L — ABNORMAL HIGH
Protoporphyrin: 3.7 mcg/L (ref 0.4–4.8)
Total porphyrins: 7.7 mcg/L — ABNORMAL HIGH (ref 1.0–5.6)
Uroporphyrin: 1.6 mcg/L — ABNORMAL HIGH

## 2014-07-05 ENCOUNTER — Other Ambulatory Visit (HOSPITAL_BASED_OUTPATIENT_CLINIC_OR_DEPARTMENT_OTHER): Payer: 59

## 2014-07-05 DIAGNOSIS — D599 Acquired hemolytic anemia, unspecified: Secondary | ICD-10-CM

## 2014-07-05 DIAGNOSIS — D589 Hereditary hemolytic anemia, unspecified: Secondary | ICD-10-CM

## 2014-07-05 DIAGNOSIS — C61 Malignant neoplasm of prostate: Secondary | ICD-10-CM

## 2014-07-05 LAB — CBC & DIFF AND RETIC
BASO%: 0.3 % (ref 0.0–2.0)
Basophils Absolute: 0 10*3/uL (ref 0.0–0.1)
EOS%: 0.3 % (ref 0.0–7.0)
Eosinophils Absolute: 0 10*3/uL (ref 0.0–0.5)
HCT: 28.4 % — ABNORMAL LOW (ref 38.4–49.9)
HGB: 8.5 g/dL — ABNORMAL LOW (ref 13.0–17.1)
Immature Retic Fract: 45.7 % — ABNORMAL HIGH (ref 3.00–10.60)
LYMPH%: 38.3 % (ref 14.0–49.0)
MCH: 24.4 pg — ABNORMAL LOW (ref 27.2–33.4)
MCHC: 29.9 g/dL — ABNORMAL LOW (ref 32.0–36.0)
MCV: 81.4 fL (ref 79.3–98.0)
MONO#: 0.8 10*3/uL (ref 0.1–0.9)
MONO%: 11.7 % (ref 0.0–14.0)
NEUT#: 3.6 10*3/uL (ref 1.5–6.5)
NEUT%: 49.4 % (ref 39.0–75.0)
Platelets: 211 10*3/uL (ref 140–400)
RBC: 3.49 10*6/uL — ABNORMAL LOW (ref 4.20–5.82)
RDW: 39.2 % — ABNORMAL HIGH (ref 11.0–14.6)
Retic %: 9.98 % — ABNORMAL HIGH (ref 0.80–1.80)
Retic Ct Abs: 348.3 10*3/uL — ABNORMAL HIGH (ref 34.80–93.90)
WBC: 7.2 10*3/uL (ref 4.0–10.3)
lymph#: 2.8 10*3/uL (ref 0.9–3.3)

## 2014-07-05 LAB — COMPREHENSIVE METABOLIC PANEL (CC13)
ALT: 31 U/L (ref 0–55)
AST: 100 U/L — ABNORMAL HIGH (ref 5–34)
Albumin: 4 g/dL (ref 3.5–5.0)
Alkaline Phosphatase: 46 U/L (ref 40–150)
Anion Gap: 12 mEq/L — ABNORMAL HIGH (ref 3–11)
BUN: 15.7 mg/dL (ref 7.0–26.0)
CO2: 25 mEq/L (ref 22–29)
Calcium: 9.6 mg/dL (ref 8.4–10.4)
Chloride: 102 mEq/L (ref 98–109)
Creatinine: 1.1 mg/dL (ref 0.7–1.3)
EGFR: 86 mL/min/{1.73_m2} — ABNORMAL LOW (ref >90)
Glucose: 100 mg/dl (ref 70–140)
Potassium: 3.8 mEq/L (ref 3.5–5.1)
Sodium: 138 mEq/L (ref 136–145)
Total Bilirubin: 1.84 mg/dL — ABNORMAL HIGH (ref 0.20–1.20)
Total Protein: 6.9 g/dL (ref 6.4–8.3)

## 2014-07-05 LAB — LACTATE DEHYDROGENASE (CC13): LDH: 3483 U/L — ABNORMAL HIGH (ref 125–245)

## 2014-07-05 LAB — PORPHYRINS, FRACTIONATED, RANDOM URINE
Coproporphyrin III (PORFRU): 56.7 mcg/g creat (ref 4.1–76.4)
Coproporphyrin, random ur: 33.7 mcg/g creat — ABNORMAL HIGH (ref 5.6–28.6)
Heptacarboxyporphyrin, random ur: 6.8 mcg/g creat — ABNORMAL HIGH (ref <=2.9)
Pentacarboxyporphyrin, random ur: 2.7 mcg/g creat (ref <=3.5)
Total Porphyrins, random ur: 143.9 mcg/g creat — ABNORMAL HIGH (ref 23.3–132.4)
Uroporphyrin III (PORFRU): 7.6 mcg/g creat — ABNORMAL HIGH (ref <=4.8)
Uroporphyrin, random ur: 36.4 mcg/g creat — ABNORMAL HIGH (ref 3.1–18.2)

## 2014-07-05 LAB — TECHNOLOGIST REVIEW

## 2014-07-06 LAB — PNH PROFILE (-HIGH SENSITIVITY)
Number of markers:: 9
Viability (%): 66 %

## 2014-07-06 LAB — OTHER SOLSTAS TEST

## 2014-07-06 LAB — ANCA SCREEN W REFLEX TITER
Atypical p-ANCA Screen: NEGATIVE
c-ANCA Screen: NEGATIVE
p-ANCA Screen: NEGATIVE

## 2014-07-06 LAB — HEAVY METALS, BLOOD
Arsenic: <3 mcg/L (ref <23)
Lead: <2 ug/dL (ref <10)
Mercury, B: <4 mcg/L (ref <=10)

## 2014-07-06 LAB — ALPHA-THALASSEMIA GENOTYPR

## 2014-07-06 LAB — HAPTOGLOBIN: Haptoglobin: <15 mg/dL — ABNORMAL LOW (ref 43–212)

## 2014-07-06 LAB — RHEUMATOID FACTOR: Rhuematoid fact SerPl-aCnc: <10 IU/mL (ref <=14)

## 2014-07-06 LAB — ANA: Anti Nuclear Antibody(ANA): NEGATIVE

## 2014-07-08 ENCOUNTER — Telehealth: Payer: Self-pay | Admitting: Hematology

## 2014-07-08 ENCOUNTER — Ambulatory Visit (HOSPITAL_BASED_OUTPATIENT_CLINIC_OR_DEPARTMENT_OTHER): Payer: 59 | Admitting: Physician Assistant

## 2014-07-08 ENCOUNTER — Other Ambulatory Visit (HOSPITAL_BASED_OUTPATIENT_CLINIC_OR_DEPARTMENT_OTHER): Payer: 59

## 2014-07-08 ENCOUNTER — Encounter: Payer: Self-pay | Admitting: Physician Assistant

## 2014-07-08 VITALS — BP 138/71 | HR 77 | Temp 98.7°F | Resp 19 | Ht 70.0 in | Wt 154.5 lb

## 2014-07-08 DIAGNOSIS — D589 Hereditary hemolytic anemia, unspecified: Secondary | ICD-10-CM

## 2014-07-08 DIAGNOSIS — D599 Acquired hemolytic anemia, unspecified: Secondary | ICD-10-CM

## 2014-07-08 LAB — CBC & DIFF AND RETIC
BASO%: 0.2 % (ref 0.0–2.0)
Basophils Absolute: 0 10*3/uL (ref 0.0–0.1)
EOS%: 0 % (ref 0.0–7.0)
Eosinophils Absolute: 0 10*3/uL (ref 0.0–0.5)
HCT: 27.5 % — ABNORMAL LOW (ref 38.4–49.9)
HGB: 8.3 g/dL — ABNORMAL LOW (ref 13.0–17.1)
Immature Retic Fract: 43.4 % — ABNORMAL HIGH (ref 3.00–10.60)
LYMPH%: 17.5 % (ref 14.0–49.0)
MCH: 24.3 pg — ABNORMAL LOW (ref 27.2–33.4)
MCHC: 30.2 g/dL — ABNORMAL LOW (ref 32.0–36.0)
MCV: 80.6 fL (ref 79.3–98.0)
MONO#: 0.8 10*3/uL (ref 0.1–0.9)
MONO%: 6.7 % (ref 0.0–14.0)
NEUT#: 9.1 10*3/uL — ABNORMAL HIGH (ref 1.5–6.5)
NEUT%: 75.6 % — ABNORMAL HIGH (ref 39.0–75.0)
Platelets: 231 10*3/uL (ref 140–400)
RBC: 3.41 10*6/uL — ABNORMAL LOW (ref 4.20–5.82)
RDW: 41.3 % — ABNORMAL HIGH (ref 11.0–14.6)
Retic %: 9.68 % — ABNORMAL HIGH (ref 0.80–1.80)
Retic Ct Abs: 330.09 10*3/uL — ABNORMAL HIGH (ref 34.80–93.90)
WBC: 12.1 10*3/uL — ABNORMAL HIGH (ref 4.0–10.3)
lymph#: 2.1 10*3/uL (ref 0.9–3.3)

## 2014-07-08 MED ORDER — FOLIC ACID 1 MG PO TABS: 1.0000 mg | ORAL_TABLET | Freq: Every day | ORAL | Status: DC

## 2014-07-08 NOTE — Progress Notes (Addendum)
[facility]  [phone] [phone]  Clinic Office Visit Note   Patient Care Team: Hendricks Limes, MD as PCP - General 07/08/2014  CHIEF COMPLAINTS/PURPOSE OF CONSULTATION:  Anemia, follow-up after hospital discharge  ISTORY OF PRESENTING ILLNESS:  David [name] 54 y.o. male with past medical history significant for endocarditis, status post mitral valve repair 2009, hyperlipidemia, GERD, recently diagnosed prostate cancer, is here because of recently diagnosed hemolytic anemia. He was first seen by Dr. Morey Hummingbird while he was admitted to the hospital. She saw him on 06/28/2014 and started him on prednisone at 70 mg by mouth daily. He has been compliant with the prednisone and has had no GI discomfort related to the prednisone. He reports a few episodes of dark urine (Tea colored) since being seen on 06/28/2014. He is being evaluated for the cause of his hemolytic anemia. He had several studies done and presents to discuss the results.   He was hospitalized in 2009 for endocarditis, and underwent module of repair. He had severe anemia during that hospitalization, and he received 3 units of blood transfusion. He recovered well, and has been slightly anemic with hemoglobin around 11-12, the patient. He developed some fatigue, mild night sweats 3 weeks ago, he was evaluated by his primary care physician Dr. [name]. Routine lab test reviewed hemoglobin 5.9, MCV 70.6, normal WAC and a platelet count, he was referred to emergency room and was admitted. He underwent EGD and colonoscopy, which showed mild to gastropathy biopsy was negative. Colonoscopy was normal. He received 2 units of RBC, And was discharged home on 06/07/2014 with hemoglobin 8.7. He followed up with Dr. Jodi Mourning after discharge, labs showed hemoglobin 7.5 on 06/20/2014, and he developed some chest tightness. He presented to emergency room again on 2/23 and was admitted. His hemoglobin was 7.2 in the ER,  and dropped to 6.1 the morning after admission, and he received 2 units of blood transfusion.  He denies any abdominal or flank pain. Denied fever, chills, diarrhea or constipation. He has no skin rashes or lesions. Appetite remains good.  He was recently diagnosed with prostate cancer, with Gleason score 6 . This was discovered on screening PSA, and he underwent prostate biopsy. He is in the process being referred to see a urologist.   He denies family history of sickle cell disease or other inheritable anemia, except that his mother and one of his sisters are slightly anemic. He has 6 siblings. He is not sure the etiology of their anemia.     MEDICAL HISTORY:  Past Medical History  Diagnosis Date  . GERD (gastroesophageal reflux disease)   . Endocarditis 10-1999     RECURRENCE 02-2008  . History of chest tube placement 1980    R SIDE FOR SPONTANEOUS  PNEUMOTHORAX  . Periodontitis     PMH  . C. difficile colitis 2009  . External hemorrhoids without mention of complication 36/14/4315    Colonoscopy  . History of blood transfusion 02/2008, 04/2014.     for anemia.   . Prostate cancer     "5 of the samples from the biopsy was positive; Greeson # is 6"  . DDD (degenerative disc disease), lumbar   . Anemia 2009, 04/2014.     SURGICAL HISTORY: Past Surgical History  Procedure Laterality Date  . Colonoscopy  2005, 03/2012    external rrhoids 2005, normal exam 2013; [facility]  . Multiple tooth extractions  2009    "to prep for mitral valve  repair"  . Mitral valve repair  2009    via a mini thoracotomy  . Prostate biopsy  03/2014  . Cardiac catheterization  2009  . Esophagogastroduodenoscopy N/A 06/02/2014    Procedure: ESOPHAGOGASTRODUODENOSCOPY (EGD);  Surgeon: Jerene Bears, MD;  Location: [facility] ENDOSCOPY;  Service: Endoscopy;  Laterality: N/A;    SOCIAL HISTORY: History   Social History  . Marital Status: Married    Spouse Name: N/A  . Number of Children: N/A  . Years of  Education: N/A   Occupational History  . 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 newspaper delivery     delivers papers to distributors, part time on weekends.    Social History Main Topics  . Smoking status: Former Smoker -- 2.00 packs/day for 15 years    Types: Cigarettes    Quit date: 08/08/1989  . Smokeless tobacco: Never Used  . Alcohol Use: 0.0 oz/week    0 Standard drinks or equivalent per week     Comment: "stopped drinking in 1991; never treated for abuse"  . Drug Use: Yes    Special: Cocaine, Heroin, Marijuana, denies IV drug      Comment: "stopped using drugs in ~ 1984"  . Sexual Activity: No   Other Topics Concern  . 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 Ovarian cancer Sister   . Diabetes Neg Hx   . Heart disease Neg Hx   . Stroke Neg Hx     ALLERGIES:  is allergic to clarithromycin.  MEDICATIONS:  Current Outpatient Prescriptions  Medication Sig Dispense Refill  . loratadine (CLARITIN) 10 MG tablet Take 1 tablet (10 mg total) by mouth daily. (Patient taking differently: Take 10 mg by mouth daily as needed. ) 30 tablet 3  . pantoprazole (PROTONIX) 40 MG tablet Take 1 tablet (40 mg total) by mouth daily. (Patient taking differently: Take 40 mg by mouth daily as needed. ) 30 tablet 0  . potassium chloride SA (K-DUR,KLOR-CON) 20 MEQ tablet Take 2 tablets (40 mEq total) by mouth 2 (two) times daily. 30 tablet 2  . predniSONE (DELTASONE) 10 MG tablet Take 7 tablets (70 mg total) by mouth daily with breakfast. 150 tablet 1  . tadalafil (CIALIS) 20 MG tablet 1/2- 1 every 3 days as needed 5 tablet 11  . folic acid (FOLVITE) 1 MG tablet Take 1 tablet (1 mg total) by mouth daily. 30 tablet 3   No current facility-administered medications for this visit.    REVIEW OF SYSTEMS:   Constitutional: Denies fevers, chills or abnormal night  sweats Eyes: Denies blurriness of vision, double vision or watery eyes Ears, nose, mouth, throat, and face: Denies mucositis or sore throat Respiratory: Denies cough, dyspnea or wheezes Cardiovascular: Denies palpitation, chest discomfort or lower extremity swelling Gastrointestinal:  Denies nausea, heartburn or change in bowel habits Skin: Denies abnormal skin rashes Lymphatics: Denies new lymphadenopathy or easy bruising Neurological:Denies numbness, tingling or new weaknesses Behavioral/Psych: Mood is stable, no new changes  Genitourinary: He does report occasional dark (Tea colored) urine however the episodes are less frequent and the urine is not quite as dark All other systems were reviewed with the patient and are negative.  PHYSICAL EXAMINATION: ECOG PERFORMANCE STATUS: 0 - Asymptomatic  Filed Vitals:   07/08/14 1046  BP: 138/71  Pulse: 77  Temp: 98.7 F (37.1  C)  Resp: 19   Filed Weights   07/08/14 1046  Weight: 154 lb 8 oz (70.081 kg)    GENERAL:alert, no distress and comfortable SKIN: skin color, texture, turgor are normal, no rashes or significant lesions EYES: normal, conjunctiva are pink and non-injected, sclera clear OROPHARYNX:no exudate, no erythema and lips, buccal mucosa, and tongue normal  NECK: supple, thyroid normal size, non-tender, without nodularity LYMPH:  no palpable lymphadenopathy in the cervical, axillary or inguinal LUNGS: clear to auscultation and percussion with normal breathing effort HEART: regular rate & rhythm and no murmurs and no lower extremity edema ABDOMEN:abdomen soft, non-tender and normal bowel sounds Musculoskeletal:no cyanosis of digits and no clubbing  PSYCH: alert & oriented x 3 with fluent speech NEURO: no focal motor/sensory deficits  LABORATORY DATA:  I have reviewed the data as listed CBC Latest Ref Rng 07/08/2014 07/05/2014 07/01/2014  WBC 4.0 - 10.3 10e3/uL 12.1(H) 7.2 9.0  Hemoglobin 13.0 - 17.1 g/dL 8.3(L) 8.5(L) 8.6(L)   Hematocrit 38.4 - 49.9 % 27.5(L) 28.4(L) 27.8(L)  Platelets 140 - 400 10e3/uL 231 211 Large & giant platelets 220    CMP Latest Ref Rng 07/05/2014 07/01/2014 06/28/2014  Glucose 70 - 140 mg/dl 100 87 83  BUN 7.0 - 26.0 mg/dL 15.7 11.7 10.1  Creatinine 0.7 - 1.3 mg/dL 1.1 1.1 1.0  Sodium 136 - 145 mEq/L 138 140 138  Potassium 3.5 - 5.1 mEq/L 3.8 3.0(LL) 3.8  Chloride 96 - 112 mmol/L - - -  CO2 22 - 29 mEq/L 25 24 24   Calcium 8.4 - 10.4 mg/dL 9.6 9.6 9.3  Total Protein 6.4 - 8.3 g/dL 6.9 7.1 7.3  Total Bilirubin 0.20 - 1.20 mg/dL 1.84(H) 2.00(H) 2.03(H)  Alkaline Phos 40 - 150 U/L 46 45 45  AST 5 - 34 U/L 100(H) 125(H) 110(H)  ALT 0 - 55 U/L 31 31 25      RADIOGRAPHIC STUDIES:  Ct chest, Abdomen Pelvis W Contrast 06/22/2014   IMPRESSION: 1. No evidence of lymphadenopathy. 2. 10 mm nodule in the right middle lobe. Given the size of this nodule, biopsy or follow-up PET-CT should be considered. If these not performed, short-term follow-up with repeat CT in 3 months is recommended. 3. Area of hypoattenuation in the pancreatic head that may be artifact from dense adjacent colonic contrast. A mass is not excluded, but felt less likely. This could also be the assessed on short-term followup CT or pancreatic MRI with and without contrast. 4. Mild wall thickening of the bladder. Consider cystitis if there are consistent symptoms. 5. Trace pelvic free fluid, nonspecific.   Electronically Signed   By: Lajean Manes M.D.   On: 06/22/2014 15:49    ASSESSMENT & PLAN:  53 year old African-American male, with past medical history of endocarditis in 2001 and 2009 status post mitral valve repair in 2009, recently diagnosed prostate cancer, presented with recurrent severe anemia required hospitalization.   1. Hemolytic Anemia -His anemia workup showed normal iron and TIBC, slightly low ferritin, normal H-47 and folic acid, no evidence of nutritional deficient anemia and GI workup was negative for bleeding.  Additionally his PNH was negative the ANA was negative the G6PD was within normal range 11.2 however, based he had heterozygous positivity for alpha thalassemia. -He has significant reticulocytosis, right high LDH, slightly elevated indirect bilirubin, low haptoglobin, peripheral smear showed abundant schistocytes, occasional target cells, his presentation is consistent with hemolysis. -Coombs test was negative, autoimmune hemolysis is less likely but possible -The etiology of hemostasis remains uncertain,  and differential is broad, including Coombs negative autoimmune hemolysis, hemoglobinopathy, such as thalassemia, sickle cell disease, spherocytosis, or RBC membrane enzyme deficiency deficiency such as G6PD deficiency, ect. Intravascular hemolysis secondary to his valve regurgitation or DIC are also possible, but the DIC lab was not supportive.  -His anemia is microcytic (MCV in the low 70s), although he had normal MCV in the past. This is not consistent with thalassemia -No prior exposure to medication or purpose except Krill. I told him to stop all supplements. -His physical exam and CT scan were negative for adenopathy. No evidence of lymphoma -Patient was discussed with and also seen by Dr. Burr Medico. His hemoglobin is actually drifting slowly downward despite being on 70 mg of prednisone since 06/28/2014. Dr. Burr Medico will discontinue the prednisone and plan for intermittent supportive packed red blood cell transfusion if his hemoglobin is around 8 or less. She is planning further studies to evaluate the cause of a hemolytic anemia.  -Lab CBC weekly for the next 3 weeks -He'll be seen in follow-up in approximately 3-4 weeks by Dr. Burr Medico   All questions were answered. The patient knows to call the clinic with any problems, questions or concerns.      Carlton Adam, PA-C 07/08/2014 4:44 PM   Attending addendum I have seen the patient, examined him. I agree with the assessment and and plan and  have edited the notes.  He is clinically doing well, his anemia is asymptomatic. No response to prednisone, will stop.  Alpha thalassemia gene mutation came back (+) heterozygous I suspect he has additional reason for hemolysis, giving the moderate to severe anemia and required multiple blood transfusion. I will speak to his cardiologist to review his echo, to see if his mitral valve cause significant pressure gradients turbulence which can cause hemolysis. I'll check RBC membrane and some deficiency, including pyruvate kinase, 5 nucleotidase enzymes Other possibility including prostate cancer related microangiopathy. Will discuss with his urologist to see if he will have his prostate cancer treated as soon as possible.  We'll check his CBC weekly, set of blood transfusion next week. I'll see him back in 3 weeks. The  Truitt Merle  07/08/2014

## 2014-07-08 NOTE — Telephone Encounter (Signed)
s.w. pt and advised on March appt....pt ok and aware °

## 2014-07-08 NOTE — Patient Instructions (Signed)
Continue weekly labs as scheduled Return for transfusion as ordered by Dr. Burr Medico Follow-up in 3-4 weeks as scheduled

## 2014-07-12 ENCOUNTER — Other Ambulatory Visit: Payer: Self-pay | Admitting: Hematology

## 2014-07-12 DIAGNOSIS — I059 Rheumatic mitral valve disease, unspecified: Secondary | ICD-10-CM

## 2014-07-13 ENCOUNTER — Ambulatory Visit (HOSPITAL_BASED_OUTPATIENT_CLINIC_OR_DEPARTMENT_OTHER): Payer: 59

## 2014-07-13 ENCOUNTER — Other Ambulatory Visit (HOSPITAL_BASED_OUTPATIENT_CLINIC_OR_DEPARTMENT_OTHER): Payer: 59

## 2014-07-13 ENCOUNTER — Ambulatory Visit (HOSPITAL_COMMUNITY)
Admission: RE | Admit: 2014-07-13 | Discharge: 2014-07-13 | Disposition: A | Discharge status: Home / Self Care | Payer: 59 | Source: Ambulatory Visit | Attending: Hematology | Admitting: Hematology

## 2014-07-13 VITALS — BP 115/70 | HR 78 | Temp 98.4°F | Resp 18

## 2014-07-13 DIAGNOSIS — C61 Malignant neoplasm of prostate: Secondary | ICD-10-CM

## 2014-07-13 DIAGNOSIS — T733XXA Exhaustion due to excessive exertion, initial encounter: Secondary | ICD-10-CM

## 2014-07-13 DIAGNOSIS — R000 Tachycardia, unspecified: Secondary | ICD-10-CM

## 2014-07-13 DIAGNOSIS — D589 Hereditary hemolytic anemia, unspecified: Secondary | ICD-10-CM | POA: Diagnosis not present

## 2014-07-13 LAB — CBC & DIFF AND RETIC
BASO%: 0.2 % (ref 0.0–2.0)
Basophils Absolute: 0 10*3/uL (ref 0.0–0.1)
EOS%: 1.9 % (ref 0.0–7.0)
Eosinophils Absolute: 0.1 10*3/uL (ref 0.0–0.5)
HCT: 24.2 % — ABNORMAL LOW (ref 38.4–49.9)
HGB: 7.4 g/dL — ABNORMAL LOW (ref 13.0–17.1)
Immature Retic Fract: 37.6 % — ABNORMAL HIGH (ref 3.00–10.60)
LYMPH%: 28.5 % (ref 14.0–49.0)
MCH: 24.2 pg — ABNORMAL LOW (ref 27.2–33.4)
MCHC: 30.6 g/dL — ABNORMAL LOW (ref 32.0–36.0)
MCV: 79.1 fL — ABNORMAL LOW (ref 79.3–98.0)
MONO#: 0.4 10*3/uL (ref 0.1–0.9)
MONO%: 8.2 % (ref 0.0–14.0)
NEUT#: 3.3 10*3/uL (ref 1.5–6.5)
NEUT%: 61.2 % (ref 39.0–75.0)
Platelets: 201 10*3/uL (ref 140–400)
RBC: 3.06 10*6/uL — ABNORMAL LOW (ref 4.20–5.82)
RDW: 42.8 % — ABNORMAL HIGH (ref 11.0–14.6)
Retic %: 9.11 % — ABNORMAL HIGH (ref 0.80–1.80)
Retic Ct Abs: 278.77 10*3/uL — ABNORMAL HIGH (ref 34.80–93.90)
WBC: 5.4 10*3/uL (ref 4.0–10.3)
lymph#: 1.5 10*3/uL (ref 0.9–3.3)

## 2014-07-13 LAB — TECHNOLOGIST REVIEW

## 2014-07-13 LAB — PREPARE RBC (CROSSMATCH)

## 2014-07-13 LAB — ABO/RH: ABO/RH(D): O POS

## 2014-07-13 LAB — HOLD TUBE, BLOOD BANK

## 2014-07-13 MED ORDER — SODIUM CHLORIDE 0.9 % IV SOLN
250.0000 mL | Freq: Once | INTRAVENOUS | Status: AC
  Administered 2014-07-13: 250 mL via INTRAVENOUS

## 2014-07-13 MED ORDER — DIPHENHYDRAMINE HCL 25 MG PO CAPS
25.0000 mg | ORAL_CAPSULE | Freq: Once | ORAL | Status: AC
  Administered 2014-07-13: 25 mg via ORAL

## 2014-07-13 MED ORDER — ACETAMINOPHEN 325 MG PO TABS
650.0000 mg | ORAL_TABLET | Freq: Once | ORAL | Status: AC
  Administered 2014-07-13: 650 mg via ORAL

## 2014-07-13 MED ORDER — DIPHENHYDRAMINE HCL 25 MG PO CAPS
ORAL_CAPSULE | ORAL | Status: AC
  Filled 2014-07-13: qty 1

## 2014-07-13 MED ORDER — ACETAMINOPHEN 325 MG PO TABS
ORAL_TABLET | ORAL | Status: AC
Start: 2014-07-13 — End: 2014-07-13
  Filled 2014-07-13: qty 2

## 2014-07-13 NOTE — Patient Instructions (Signed)

## 2014-07-14 LAB — TYPE AND SCREEN
ABO/RH(D): O POS
Antibody Screen: NEGATIVE
Unit division: 0
Unit division: 0

## 2014-07-15 ENCOUNTER — Encounter: Payer: Self-pay | Admitting: Nurse Practitioner

## 2014-07-15 ENCOUNTER — Encounter: Payer: Self-pay | Admitting: Cardiovascular Disease

## 2014-07-15 ENCOUNTER — Telehealth: Payer: Self-pay | Admitting: Hematology

## 2014-07-15 ENCOUNTER — Ambulatory Visit (INDEPENDENT_AMBULATORY_CARE_PROVIDER_SITE_OTHER): Payer: 59 | Admitting: Cardiovascular Disease

## 2014-07-15 VITALS — BP 110/80 | HR 77 | Ht 70.0 in | Wt 154.6 lb

## 2014-07-15 DIAGNOSIS — I059 Rheumatic mitral valve disease, unspecified: Secondary | ICD-10-CM

## 2014-07-15 NOTE — Patient Instructions (Signed)
Your physician has requested that you have a TEE. During a TEE, sound waves are used to create images of your heart. It provides your doctor with information about the size and shape of your heart and how well your heart's chambers and valves are working. In this test, a transducer is attached to the end of a flexible tube that's guided down your throat and into your esophagus (the tube leading from you mouth to your stomach) to get a more detailed image of your heart. You are not awake for the procedure. Please see the instruction sheet given to you today. For further information please visit [email or web address].   Your physician recommends that you schedule a follow-up appointment in: with Dr. Stanford Breed as directed after the TEE

## 2014-07-15 NOTE — Progress Notes (Signed)
Cardiology Office Note   Date:  07/15/2014   ID:  David [name], DOB 05-Jan-1961, MRN [id number]  PCP:  Unice Cobble, MD  Cardiologist:   Kirk Ruths,  MD   Chief Complaint  Patient presents with  . Follow-up    MV repair     1. S/p mitral valve repair - significant MR by echo  2. + prostate cancer  By Bx 3.  History of Present Illness: David [name] is a 54 y.o. male who presents for follow up of his MR He has seen Dr. Stanford Breed  He had MV repair in 2009 David [name])   Several months ago he went to his medical doctor for extreme fatigue. He was found to be severely anemic. He received a transfusion. Further workup has revealed that he has profound hemolysis. He's been seeing the hematologist. He's not had any other cause for his hemolytic anemia.   He denies any fevers weight loss or night sweats.  No symptoms or suggestions of endocarditis.    Past Medical History  Diagnosis Date  . GERD (gastroesophageal reflux disease)   . Endocarditis 10-1999     RECURRENCE 02-2008  . History of chest tube placement 1980    R SIDE FOR SPONTANEOUS  PNEUMOTHORAX  . Periodontitis     PMH  . C. difficile colitis 2009  . External hemorrhoids without mention of complication 25/08/3974    Colonoscopy  . History of blood transfusion 02/2008, 04/2014.     for anemia.   . Prostate cancer     "5 of the samples from the biopsy was positive; Greeson # is 6"  . DDD (degenerative disc disease), lumbar   . Anemia 2009, 04/2014.     Past Surgical History  Procedure Laterality Date  . Colonoscopy  2005, 03/2012    external rrhoids 2005, normal exam 2013; [facility]  . Multiple tooth extractions  2009    "to prep for mitral valve repair"  . Mitral valve repair  2009    via a mini thoracotomy  . Prostate biopsy  03/2014  . Cardiac catheterization  2009  . Esophagogastroduodenoscopy N/A 06/02/2014    Procedure: ESOPHAGOGASTRODUODENOSCOPY (EGD);  Surgeon: Jerene Bears, MD;  Location: [facility]  ENDOSCOPY;  Service: Endoscopy;  Laterality: N/A;     Current Outpatient Prescriptions  Medication Sig Dispense Refill  . folic acid (FOLVITE) 1 MG tablet Take 1 tablet (1 mg total) by mouth daily. 30 tablet 3  . loratadine (CLARITIN) 10 MG tablet Take 1 tablet (10 mg total) by mouth daily. (Patient taking differently: Take 10 mg by mouth daily as needed. ) 30 tablet 3  . pantoprazole (PROTONIX) 40 MG tablet Take 1 tablet (40 mg total) by mouth daily. (Patient taking differently: Take 40 mg by mouth daily as needed. ) 30 tablet 0  . potassium chloride SA (K-DUR,KLOR-CON) 20 MEQ tablet Take 2 tablets (40 mEq total) by mouth 2 (two) times daily. 30 tablet 2  . tadalafil (CIALIS) 20 MG tablet 1/2- 1 every 3 days as needed (Patient not taking: Reported on 07/15/2014) 5 tablet 11   No current facility-administered medications for this visit.    Allergies:   Clarithromycin    Social History:  The patient  reports that he quit smoking about 24 years ago. His smoking use included Cigarettes. He has a 30 pack-year smoking history. He has never used smokeless tobacco. He reports that he drinks alcohol. He reports that he uses illicit drugs (Cocaine, Heroin,  and Marijuana).   Family History:  The patient's family history includes Alzheimer's disease in his father; Ovarian cancer in his sister. There is no history of Diabetes, Heart disease, or Stroke.    ROS:  Please see the history of present illness.    Review of Systems: Constitutional:  denies fever, chills, diaphoresis, appetite change.  He has had some  Fatigue related to anemia. [name]  HEENT: denies photophobia, eye pain, redness, hearing loss, ear pain, congestion, sore throat, rhinorrhea, sneezing, neck pain, neck stiffness and tinnitus.  Respiratory: admits to SOB,    Cardiovascular: denies chest pain, palpitations and leg swelling.  Gastrointestinal: denies nausea, vomiting, abdominal pain, diarrhea, constipation, blood in stool.    Genitourinary: denies dysuria, urgency, frequency, hematuria, flank pain and difficulty urinating.  Musculoskeletal: denies  myalgias, back pain, joint swelling, arthralgias and gait problem.   Skin: denies pallor, rash and wound.  Neurological: denies dizziness, seizures, syncope, weakness, light-headedness, numbness and headaches.   Hematological: denies adenopathy, easy bruising, personal or family bleeding history.  Psychiatric/ Behavioral: denies suicidal ideation, mood changes, confusion, nervousness, sleep disturbance and agitation.       All other systems are reviewed and negative.    PHYSICAL EXAM: VS:  BP 110/80 mmHg  Pulse 77  Ht 5\' 10"  (1.778 m)  Wt 154 lb 9.6 oz (70.126 kg)  BMI 22.18 kg/m2 , BMI Body mass index is 22.18 kg/(m^2). GEN: Well nourished, well developed, in no acute distress HEENT: normal Neck: no JVD, carotid bruits, or masses Cardiac: RRR; 2-3 / 6 systolic  murmur, rubs, or gallops,no edema  Respiratory:  clear to auscultation bilaterally, normal work of breathing GI: soft, nontender, nondistended, + BS MS: no deformity or atrophy Skin: warm and dry, no rash Neuro:  Strength and sensation are intact Psych: normal   EKG:  EKG is ordered today. The ekg ordered today demonstrates normal sinus rhythm rate of 77. Poor R wave progression.   Recent Labs: 06/01/2014: TSH 0.941 07/05/2014: ALT 31; BUN 15.7; Creatinine 1.1; Potassium 3.8; Sodium 138 07/13/2014: Hemoglobin 7.4*; Platelets 201    Lipid Panel    Component Value Date/Time   CHOL 231* 11/19/2011 1135   TRIG 63.0 11/19/2011 1135   HDL 79.60 11/19/2011 1135   CHOLHDL 3 11/19/2011 1135   VLDL 12.6 11/19/2011 1135   LDLCALC  04/01/2008 0415    74        Total Cholesterol/HDL:CHD Risk Coronary Heart Disease Risk Table                     Men   Women  1/2 Average Risk   3.4   3.3   LDLDIRECT 132.4 11/19/2011 1135      Wt Readings from Last 3 Encounters:  07/15/14 154 lb 9.6 oz (70.126  kg)  07/08/14 154 lb 8 oz (70.081 kg)  06/28/14 155 lb 14.4 oz (70.716 kg)      Other studies Reviewed: Additional studies/ records that were reviewed today include: . Review of the above records demonstrates:    ASSESSMENT AND PLAN:  1.  Status post mitral valve replacement with significant mitral regurgitation. [place] presents with profound hemolytic anemia. He has at least moderate mitral regurgitation. I read the echo out as being severe. I reviewed the echocardiogram with Dr. Stanford Breed. We both agree that he needs a transesophageal echo for further evaluation. He started hearing his heart beating up in his ears about 2 months ago and that correlates with his development of  hemolytic anemia. He has no recent signs or symptoms of endocarditis.   He is now requiring transfusions about every 2-3 weeks.  We will schedule him to get a transesophageal echo with Dr. Stanford Breed. Further workup based on the results of the TEE. Follow up with Dr. Stanford Breed.    Current medicines are reviewed at length with the patient today.  The patient does not have concerns regarding medicines.  The following changes have been made:  no change  Labs/ tests ordered today include:  No orders of the defined types were placed in this encounter.     Disposition:   FU with Dr. Stanford Breed in several weeks     Signed, Nahser, Wonda Cheng, MD  07/15/2014 12:30 PM    [address], [place], [place]  [id number] Phone: [phone]; Fax: [phone]

## 2014-07-15 NOTE — Telephone Encounter (Signed)
Spoke with AJ on 07/08/14 and per AJ pt was to be moved from her schedule 3/30 to YF 4/1 per YF. Due to PAL YF is not in the office on 4/1. I emailed YF for clarity regarding follow and have not received a response at this time. I spoke with desk nurse today and per desk nurse as per YF move patient from [place] 3/30 to [place] 4/7. Keep lab 3/30 as scheduled. Left message for patient regarding change and confirmed appointments for lab 3/23, lab only now on 3/30 and new appointment for lab/YF 4/7.

## 2014-07-18 LAB — PYRUVATE KINASE

## 2014-07-18 LAB — NUCLEOTIDASE, 5', BLOOD: 5-Nucleotidase: <3 U/L (ref <11)

## 2014-07-18 LAB — GLUCOSE 6 PHOSPHATE DEHYDROGENASE: G-6PDH: 19.2 U/g Hgb (ref 7.0–20.5)

## 2014-07-19 ENCOUNTER — Encounter (HOSPITAL_COMMUNITY): Payer: Self-pay | Admitting: *Deleted

## 2014-07-19 ENCOUNTER — Ambulatory Visit (HOSPITAL_COMMUNITY)
Admission: RE | Admit: 2014-07-19 | Discharge: 2014-07-19 | Disposition: A | Discharge status: Home / Self Care | Payer: 59 | Source: Ambulatory Visit | Attending: Cardiology | Admitting: Cardiology

## 2014-07-19 ENCOUNTER — Encounter (HOSPITAL_COMMUNITY)
Admission: RE | Disposition: A | Discharge status: Home / Self Care | Payer: Self-pay | Source: Ambulatory Visit | Attending: Cardiology

## 2014-07-19 DIAGNOSIS — C61 Malignant neoplasm of prostate: Secondary | ICD-10-CM | POA: Insufficient documentation

## 2014-07-19 DIAGNOSIS — Z87891 Personal history of nicotine dependence: Secondary | ICD-10-CM | POA: Insufficient documentation

## 2014-07-19 DIAGNOSIS — I059 Rheumatic mitral valve disease, unspecified: Secondary | ICD-10-CM | POA: Diagnosis not present

## 2014-07-19 DIAGNOSIS — M5136 Other intervertebral disc degeneration, lumbar region: Secondary | ICD-10-CM | POA: Insufficient documentation

## 2014-07-19 DIAGNOSIS — I340 Nonrheumatic mitral (valve) insufficiency: Secondary | ICD-10-CM | POA: Insufficient documentation

## 2014-07-19 DIAGNOSIS — K219 Gastro-esophageal reflux disease without esophagitis: Secondary | ICD-10-CM | POA: Diagnosis not present

## 2014-07-19 HISTORY — PX: TEE WITHOUT CARDIOVERSION: SHX5443

## 2014-07-19 SURGERY — ECHOCARDIOGRAM, TRANSESOPHAGEAL
Anesthesia: Moderate Sedation

## 2014-07-19 MED ORDER — FENTANYL CITRATE 0.05 MG/ML IJ SOLN
INTRAMUSCULAR | Status: DC | PRN
  Administered 2014-07-19 (×2): 25 ug via INTRAVENOUS

## 2014-07-19 MED ORDER — BUTAMBEN-TETRACAINE-BENZOCAINE 2-2-14 % EX AERO
INHALATION_SPRAY | CUTANEOUS | Status: DC | PRN
  Administered 2014-07-19: 2 via TOPICAL

## 2014-07-19 MED ORDER — MIDAZOLAM HCL 10 MG/2ML IJ SOLN
INTRAMUSCULAR | Status: DC | PRN
  Administered 2014-07-19 (×4): 2 mg via INTRAVENOUS

## 2014-07-19 MED ORDER — MIDAZOLAM HCL 5 MG/ML IJ SOLN
INTRAMUSCULAR | Status: AC
  Filled 2014-07-19: qty 2

## 2014-07-19 MED ORDER — SODIUM CHLORIDE 0.9 % IV SOLN
INTRAVENOUS | Status: DC
  Administered 2014-07-19: 500 mL via INTRAVENOUS

## 2014-07-19 MED ORDER — FENTANYL CITRATE 0.05 MG/ML IJ SOLN
INTRAMUSCULAR | Status: AC
  Filled 2014-07-19: qty 2

## 2014-07-19 NOTE — H&P (Signed)
David [name]  07/15/2014 11:45 AM  Office Visit  MRN:  [id number]   Description: Male DOB: 07/20/1960  Provider: Thayer Headings, MD  Department: [facility] [facility]       Vital Signs  Most recent update: 07/15/2014 12:06 PM by Stephannie Peters, CMA    BP Pulse Ht Wt BMI    110/80 mmHg 77 5\' 10"  (1.778 m) 154 lb 9.6 oz (70.126 kg) 22.18 kg/m2    Vitals History     Progress Notes      Thayer Headings, MD at 07/15/2014 12:30 PM     Status: Signed       Expand All Collapse All     Cardiology Office Note   Date: 07/15/2014   ID: David [name], DOB [birth year], MRN [id number]  PCP: Unice Cobble, MD Cardiologist: Kirk Ruths, MD   Chief Complaint  Patient presents with  . Follow-up    MV repair    1. S/p mitral valve repair - significant MR by echo  2. + prostate cancer By Bx 3.  History of Present Illness: David [name] is a 54 y.o. male who presents for follow up of his MR He has seen Dr. Stanford Breed  He had MV repair in 2009 Roxy Manns)   Several months ago he went to his medical doctor for extreme fatigue. He was found to be severely anemic. He received a transfusion. Further workup has revealed that he has profound hemolysis. He's been seeing the hematologist. He's not had any other cause for his hemolytic anemia.   He denies any fevers weight loss or night sweats. No symptoms or suggestions of endocarditis.    Past Medical History  Diagnosis Date  . GERD (gastroesophageal reflux disease)   . Endocarditis 10-1999    RECURRENCE 02-2008  . History of chest tube placement 1980    R SIDE FOR SPONTANEOUS PNEUMOTHORAX  . Periodontitis     PMH  . C. difficile colitis 2009  . External hemorrhoids without mention of complication 02/58/5277    Colonoscopy  . History of blood transfusion 02/2008, 04/2014.     for anemia.   . Prostate cancer     "5 of the samples from the biopsy was  positive; Greeson # is 6"  . DDD (degenerative disc disease), lumbar   . Anemia 2009, 04/2014.     Past Surgical History  Procedure Laterality Date  . Colonoscopy  2005, 03/2012    external rrhoids 2005, normal exam 2013; [facility]  . Multiple tooth extractions  2009    "to prep for mitral valve repair"  . Mitral valve repair  2009    via a mini thoracotomy  . Prostate biopsy  03/2014  . Cardiac catheterization  2009  . Esophagogastroduodenoscopy N/A 06/02/2014    Procedure: ESOPHAGOGASTRODUODENOSCOPY (EGD); Surgeon: Jerene Bears, MD; Location: [facility] ENDOSCOPY; Service: Endoscopy; Laterality: N/A;     Current Outpatient Prescriptions  Medication Sig Dispense Refill  . folic acid (FOLVITE) 1 MG tablet Take 1 tablet (1 mg total) by mouth daily. 30 tablet 3  . loratadine (CLARITIN) 10 MG tablet Take 1 tablet (10 mg total) by mouth daily. (Patient taking differently: Take 10 mg by mouth daily as needed. ) 30 tablet 3  . pantoprazole (PROTONIX) 40 MG tablet Take 1 tablet (40 mg total) by mouth daily. (Patient taking differently: Take 40 mg by mouth daily as needed. ) 30 tablet 0  . potassium chloride SA (K-DUR,KLOR-CON) 20 MEQ tablet Take  2 tablets (40 mEq total) by mouth 2 (two) times daily. 30 tablet 2  . tadalafil (CIALIS) 20 MG tablet 1/2- 1 every 3 days as needed (Patient not taking: Reported on 07/15/2014) 5 tablet 11   No current facility-administered medications for this visit.    Allergies: Clarithromycin    Social History: The patient  reports that he quit smoking about 24 years ago. His smoking use included Cigarettes. He has a 30 pack-year smoking history. He has never used smokeless tobacco. He reports that he drinks alcohol. He reports that he uses illicit drugs (Cocaine, Heroin, and Marijuana).   Family History: The patient's family history includes Alzheimer's disease in his father;  Ovarian cancer in his sister. There is no history of Diabetes, Heart disease, or Stroke.    ROS: Please see the history of present illness.    Review of Systems: Constitutional:  denies fever, chills, diaphoresis, appetite change. He has had some Fatigue related to anemia. [name]  HEENT: denies photophobia, eye pain, redness, hearing loss, ear pain, congestion, sore throat, rhinorrhea, sneezing, neck pain, neck stiffness and tinnitus.  Respiratory: admits to SOB,   Cardiovascular: denies chest pain, palpitations and leg swelling.  Gastrointestinal: denies nausea, vomiting, abdominal pain, diarrhea, constipation, blood in stool.  Genitourinary: denies dysuria, urgency, frequency, hematuria, flank pain and difficulty urinating.  Musculoskeletal: denies myalgias, back pain, joint swelling, arthralgias and gait problem.   Skin: denies pallor, rash and wound.  Neurological: denies dizziness, seizures, syncope, weakness, light-headedness, numbness and headaches.   Hematological: denies adenopathy, easy bruising, personal or family bleeding history.  Psychiatric/ Behavioral: denies suicidal ideation, mood changes, confusion, nervousness, sleep disturbance and agitation.      All other systems are reviewed and negative.    PHYSICAL EXAM: VS: BP 110/80 mmHg  Pulse 77  Ht 5\' 10"  (1.778 m)  Wt 154 lb 9.6 oz (70.126 kg)  BMI 22.18 kg/m2 , BMI Body mass index is 22.18 kg/(m^2). GEN: Well nourished, well developed, in no acute distress  HEENT: normal  Neck: no JVD, carotid bruits, or masses Cardiac: RRR; 2-3 / 6 systolic murmur, rubs, or gallops,no edema  Respiratory: clear to auscultation bilaterally, normal work of breathing GI: soft, nontender, nondistended, + BS MS: no deformity or atrophy  Skin: warm and dry, no rash Neuro: Strength and sensation are intact Psych: normal   EKG: EKG is ordered today. The ekg ordered today demonstrates normal sinus rhythm  rate of 77. Poor R wave progression.   Recent Labs: 06/01/2014: TSH 0.941 07/05/2014: ALT 31; BUN 15.7; Creatinine 1.1; Potassium 3.8; Sodium 138 07/13/2014: Hemoglobin 7.4*; Platelets 201    Lipid Panel  Labs (Brief)       Component Value Date/Time   CHOL 231* 11/19/2011 1135   TRIG 63.0 11/19/2011 1135   HDL 79.60 11/19/2011 1135   CHOLHDL 3 11/19/2011 1135   VLDL 12.6 11/19/2011 1135   LDLCALC  04/01/2008 0415    74  Total Cholesterol/HDL:CHD Risk Coronary Heart Disease Risk Table  Men Women 1/2 Average Risk 3.4 3.3   LDLDIRECT 132.4 11/19/2011 1135       Wt Readings from Last 3 Encounters:  07/15/14 154 lb 9.6 oz (70.126 kg)  07/08/14 154 lb 8 oz (70.081 kg)  06/28/14 155 lb 14.4 oz (70.716 kg)      Other studies Reviewed: Additional studies/ records that were reviewed today include: . Review of the above records demonstrates:    ASSESSMENT AND PLAN:  1. Status post mitral valve replacement  with significant mitral regurgitation. Kamel presents with profound hemolytic anemia. He has at least moderate mitral regurgitation. I read the echo out as being severe. I reviewed the echocardiogram with Dr. Stanford Breed. We both agree that he needs a transesophageal echo for further evaluation. He started hearing his heart beating up in his ears about 2 months ago and that correlates with his development of hemolytic anemia. He has no recent signs or symptoms of endocarditis.  He is now requiring transfusions about every 2-3 weeks.  We will schedule him to get a transesophageal echo with Dr. Stanford Breed. Further workup based on the results of the TEE. Follow up with Dr. Stanford Breed.    Current medicines are reviewed at length with the patient today. The patient does not have concerns regarding medicines.  The following changes have been made: no change  Labs/ tests ordered today include:  No  orders of the defined types were placed in this encounter.    Disposition: FU with Dr. Stanford Breed in several weeks    Signed, Nahser, Wonda Cheng, MD  07/15/2014 12:30 PM  [address], [place], [place] [address] Phone: [phone]; Fax: [phone]        For TEE; no changes. Kirk Ruths

## 2014-07-19 NOTE — Progress Notes (Signed)
*  PRELIMINARY RESULTS* Echocardiogram TEE has been performed.  David [name] 07/19/2014, 11:31 AM

## 2014-07-19 NOTE — Discharge Instructions (Signed)
Transesophageal Echocardiogram °Transesophageal echocardiography (TEE) is a picture test of your heart using sound waves. The pictures taken can give very detailed pictures of your heart. This can help your doctor see if there are problems with your heart. TEE can check: °· If your heart has blood clots in it. °· How well your heart valves are working. °· If you have an infection on the inside of your heart. °· Some of the major arteries of your heart. °· If your heart valve is working after a repair. °· Your heart before a procedure that uses a shock to your heart to get the rhythm back to normal. °BEFORE THE PROCEDURE °· Do not eat or drink for 6 hours before the procedure or as told by your doctor. °· Make plans to have someone drive you home after the procedure. Do not drive yourself home. °· An IV tube will be put in your arm. °PROCEDURE °· You will be given a medicine to help you relax (sedative). It will be given through the IV tube. °· A numbing medicine will be sprayed or gargled in the back of your throat to help numb it. °· The tip of the probe is placed into the back of your mouth. You will be asked to swallow. This helps to pass the probe into your esophagus. °· Once the tip of the probe is in the right place, your doctor can take pictures of your heart. °· You may feel pressure at the back of your throat. °AFTER THE PROCEDURE °· You will be taken to a recovery area so the sedative can wear off. °· Your throat may be sore and scratchy. This will go away slowly over time. °· You will go home when you are fully awake and able to swallow liquids. °· You should have someone stay with you for the next 24 hours. °· Do not drive or operate machinery for the next 24 hours. °Document Released: 02/10/2009 Document Revised: 04/20/2013 Document Reviewed: 10/15/2012 °ExitCare® Patient Information ©2015 [employer]. This information is not intended to replace advice given to you by your health care provider. Make  sure you discuss any questions you have with your health care provider. ° ° °Conscious Sedation, Adult, Care After °Refer to this sheet in the next few weeks. These instructions provide you with information on caring for yourself after your procedure. Your health care provider may also give you more specific instructions. Your treatment has been planned according to current medical practices, but problems sometimes occur. Call your health care provider if you have any problems or questions after your procedure. °WHAT TO EXPECT AFTER THE PROCEDURE  °After your procedure: °· You may feel sleepy, clumsy, and have poor balance for several hours. °· Vomiting may occur if you eat too soon after the procedure. °HOME CARE INSTRUCTIONS °· Do not participate in any activities where you could become injured for at least 24 hours. Do not: °¨ Drive. °¨ Swim. °¨ Ride a bicycle. °¨ Operate heavy machinery. °¨ Cook. °¨ Use power tools. °¨ Climb ladders. °¨ Work from a high place. °· Do not make important decisions or sign legal documents until you are improved. °· If you vomit, drink water, juice, or soup when you can drink without vomiting. Make sure you have little or no nausea before eating solid foods. °· Only take over-the-counter or prescription medicines for pain, discomfort, or fever as directed by your health care provider. °· Make sure you and your family fully understand everything about   the medicines given to you, including what side effects may occur. °· You should not drink alcohol, take sleeping pills, or take medicines that cause drowsiness for at least 24 hours. °· If you smoke, do not smoke without supervision. °· If you are feeling better, you may resume normal activities 24 hours after you were sedated. °· Keep all appointments with your health care provider. °SEEK MEDICAL CARE IF: °· Your skin is pale or bluish in color. °· You continue to feel nauseous or vomit. °· Your pain is getting worse and is not helped  by medicine. °· You have bleeding or swelling. °· You are still sleepy or feeling clumsy after 24 hours. °SEEK IMMEDIATE MEDICAL CARE IF: °· You develop a rash. °· You have difficulty breathing. °· You develop any type of allergic problem. °· You have a fever. °MAKE SURE YOU: °· Understand these instructions. °· Will watch your condition. °· Will get help right away if you are not doing well or get worse. °Document Released: 02/03/2013 Document Reviewed: 02/03/2013 °ExitCare® Patient Information ©2015 [employer]. This information is not intended to replace advice given to you by your health care provider. Make sure you discuss any questions you have with your health care provider. ° °

## 2014-07-19 NOTE — Interval H&P Note (Signed)
History and Physical Interval Note:  07/19/2014 10:15 AM  David [name]  has presented today for surgery, with the diagnosis of mitro vavle regurgitation  The various methods of treatment have been discussed with the patient and family. After consideration of risks, benefits and other options for treatment, the patient has consented to  Procedure(s): TRANSESOPHAGEAL ECHOCARDIOGRAM (TEE) (N/A) as a surgical intervention .  The patient's history has been reviewed, patient examined, no change in status, stable for surgery.  I have reviewed the patient's chart and labs.  Questions were answered to the patient's satisfaction.     Kirk Ruths

## 2014-07-19 NOTE — CV Procedure (Signed)
See full TEE report in [facility]; normal LV function; s/p MV repair; probable perforated posterior leaflet; severe [name]

## 2014-07-20 ENCOUNTER — Ambulatory Visit (INDEPENDENT_AMBULATORY_CARE_PROVIDER_SITE_OTHER): Payer: 59 | Admitting: Cardiology

## 2014-07-20 ENCOUNTER — Encounter (HOSPITAL_COMMUNITY): Payer: Self-pay | Admitting: Cardiology

## 2014-07-20 ENCOUNTER — Other Ambulatory Visit (HOSPITAL_BASED_OUTPATIENT_CLINIC_OR_DEPARTMENT_OTHER): Payer: 59

## 2014-07-20 VITALS — BP 144/84 | HR 92 | Ht 70.0 in | Wt 154.4 lb

## 2014-07-20 DIAGNOSIS — D589 Hereditary hemolytic anemia, unspecified: Secondary | ICD-10-CM

## 2014-07-20 DIAGNOSIS — D599 Acquired hemolytic anemia, unspecified: Secondary | ICD-10-CM

## 2014-07-20 DIAGNOSIS — R911 Solitary pulmonary nodule: Secondary | ICD-10-CM | POA: Diagnosis not present

## 2014-07-20 DIAGNOSIS — I059 Rheumatic mitral valve disease, unspecified: Secondary | ICD-10-CM

## 2014-07-20 LAB — CBC & DIFF AND RETIC
BASO%: 0.3 % (ref 0.0–2.0)
Basophils Absolute: 0 10*3/uL (ref 0.0–0.1)
EOS%: 1.2 % (ref 0.0–7.0)
Eosinophils Absolute: 0.1 10*3/uL (ref 0.0–0.5)
HCT: 29.2 % — ABNORMAL LOW (ref 38.4–49.9)
HGB: 9.4 g/dL — ABNORMAL LOW (ref 13.0–17.1)
Immature Retic Fract: 30.3 % — ABNORMAL HIGH (ref 3.00–10.60)
LYMPH%: 21.8 % (ref 14.0–49.0)
MCH: 26 pg — ABNORMAL LOW (ref 27.2–33.4)
MCHC: 32.2 g/dL (ref 32.0–36.0)
MCV: 80.9 fL (ref 79.3–98.0)
MONO#: 0.5 10*3/uL (ref 0.1–0.9)
MONO%: 8 % (ref 0.0–14.0)
NEUT#: 4.1 10*3/uL (ref 1.5–6.5)
NEUT%: 68.7 % (ref 39.0–75.0)
Platelets: 164 10*3/uL (ref 140–400)
RBC: 3.61 10*6/uL — ABNORMAL LOW (ref 4.20–5.82)
RDW: 37.4 % — ABNORMAL HIGH (ref 11.0–14.6)
Retic %: 7.29 % — ABNORMAL HIGH (ref 0.80–1.80)
Retic Ct Abs: 263.17 10*3/uL — ABNORMAL HIGH (ref 34.80–93.90)
WBC: 6 10*3/uL (ref 4.0–10.3)
lymph#: 1.3 10*3/uL (ref 0.9–3.3)
nRBC: 0 % (ref 0–0)

## 2014-07-20 NOTE — Patient Instructions (Signed)
Your physician recommends that you schedule a follow-up appointment in: [place] physician recommends that you return for lab work [place]

## 2014-07-20 NOTE — Progress Notes (Signed)
HPI: FU MR. He has history of endocarditis. In November of 2009 he had mitral valve repair via a mini thoracotomy. Preoperative cardiac catheterization showed no coronary disease. Patient had not been seen cardiology since 2011. In February 2016 he was admitted with complaints of fatigue and dyspnea. His hemoglobin was noted to be 5.9. He was transfused. EGD negative. (Note colonoscopy December 2013 negative). CT revealed a 10 mm nodule in the right middle lobe and biopsy or PET CT scan recommended. Possible artifact versus mass in pancreatic head. Further workup has shown the patient has a hemolytic anemia. Blood cultures February 2016 negative. Transesophageal echocardiogram on March 22 revealed normal LV function, mild left atrial enlargement and previous mitral valve repair. There was severe mitral regurgitation directed posteriorly laterally possibly from a perforated posterior mitral valve leaflet. There was a smaller second jet as well.    In further discussions with patient he did have dental work performed in November 2015. His symptoms were predominantly fatigue without dyspnea, chest pain or palpitations. No fevers or chills but he has had occasional episodes of diaphoresis.  Current Outpatient Prescriptions  Medication Sig Dispense Refill  . folic acid (FOLVITE) 1 MG tablet Take 1 tablet (1 mg total) by mouth daily. 30 tablet 3  . loratadine (CLARITIN) 10 MG tablet Take 1 tablet (10 mg total) by mouth daily. (Patient taking differently: Take 10 mg by mouth daily as needed. ) 30 tablet 3  . pantoprazole (PROTONIX) 40 MG tablet Take 1 tablet (40 mg total) by mouth daily. (Patient taking differently: Take 40 mg by mouth daily as needed. ) 30 tablet 0  . potassium chloride SA (K-DUR,KLOR-CON) 20 MEQ tablet Take 2 tablets (40 mEq total) by mouth 2 (two) times daily. 30 tablet 2   No current facility-administered medications for this visit.     Past Medical History  Diagnosis Date    . GERD (gastroesophageal reflux disease)   . Endocarditis 10-1999     RECURRENCE 02-2008  . History of chest tube placement 1980    R SIDE FOR SPONTANEOUS  PNEUMOTHORAX  . Periodontitis     PMH  . C. difficile colitis 2009  . External hemorrhoids without mention of complication 96/29/5284    Colonoscopy  . History of blood transfusion 02/2008, 04/2014.     for anemia.   . Prostate cancer     "5 of the samples from the biopsy was positive; Greeson # is 6"  . DDD (degenerative disc disease), lumbar   . Anemia 2009, 04/2014.     Past Surgical History  Procedure Laterality Date  . Colonoscopy  2005, 03/2012    external rrhoids 2005, normal exam 2013; [facility]  . Multiple tooth extractions  2009    "to prep for mitral valve repair"  . Mitral valve repair  2009    via a mini thoracotomy  . Prostate biopsy  03/2014  . Cardiac catheterization  2009  . Esophagogastroduodenoscopy N/A 06/02/2014    Procedure: ESOPHAGOGASTRODUODENOSCOPY (EGD);  Surgeon: Jerene Bears, MD;  Location: [facility] ENDOSCOPY;  Service: Endoscopy;  Laterality: N/A;  . Tee without cardioversion N/A 07/19/2014    Procedure: TRANSESOPHAGEAL ECHOCARDIOGRAM (TEE);  Surgeon: Lelon Perla, MD;  Location: [facility] ENDOSCOPY;  Service: Cardiovascular;  Laterality: N/A;    History   Social History  . Marital Status: Married    Spouse Name: N/A  . Number of Children: N/A  . Years of Education: N/A   Occupational History  .  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 newspaper delivery     delivers papers to distributors, part time on weekends.    Social History Main Topics  . Smoking status: Former Smoker -- 2.00 packs/day for 15 years    Types: Cigarettes    Quit date: 08/08/1989  . Smokeless tobacco: Never Used  . Alcohol Use: 0.0 oz/week    0 Standard drinks or equivalent per week     Comment: "stopped drinking in 1991; never treated for abuse"  . Drug Use: Yes    Special: Cocaine, Heroin, Marijuana      Comment: "stopped using drugs in ~ 1984"  . Sexual Activity: No   Other Topics Concern  . Not on file   Social History Narrative   REGULAR EXERCISE 2X [employer],WEIGHTS    ROS: fatigue and dark urine but no fevers or chills, productive cough, hemoptysis, dysphasia, odynophagia, melena, hematochezia, dysuria, hematuria, rash, seizure activity, orthopnea, PND, pedal edema, claudication. Remaining systems are negative.  Physical Exam: Well-developed well-nourished in no acute distress.  Skin is warm and dry.  HEENT is normal.  Neck is supple.  Chest is clear to auscultation with normal expansion.  Cardiovascular exam is regular rate and rhythm. 2/6 systolic murmur apex radiating to left axilla. Abdominal exam nontender or distended. No masses palpated. Extremities show no edema. neuro grossly intact No peripheral stigmata of SBE.

## 2014-07-20 NOTE — Assessment & Plan Note (Signed)
Patient presented approximately 3 months ago with severe fatigue and has been diagnosed with hemolytic anemia. Transesophageal echocardiogram performed yesterday reveals normal LV function, previous mitral valve repair with severe mitral regurgitation possibly through perforated posterior mitral valve leaflet. His mitral regurgitation is clearly significant and I believe this is likely the cause of his severe hemolytic anemia as well. The exact mechanism of his mitral regurgitation is less clear. Although endocarditis is possible particularly given the temporal relationship to dental work performed in November his recent history seems less consistent. He denies fevers or chills. He also had blood cultures performed in February with no growth. I will plan to repeat blood cultures 2. He will need redo mitral valve surgery. I will refer him back to Dr Roxy Manns for further evaluation. He will need cardiac catheterization prior to his valve surgery. The risks and benefits have been discussed and he agrees to proceed. We will need to coordinate with hematology. I would favor transfusion of 2 units of packed red blood cells with cardiac catheterization to be performed the following day. I discussed this at length with the patient and he understands and is in agreement.

## 2014-07-20 NOTE — Assessment & Plan Note (Signed)
Noted on recent CT scan. He will need a PET scan and I will arrange.

## 2014-07-20 NOTE — Assessment & Plan Note (Signed)
His hemoglobin is being followed by hematology. His hemolytic anemia I believe is related to his mitral valve repair/mitral regurgitation.

## 2014-07-26 ENCOUNTER — Other Ambulatory Visit: Payer: Self-pay | Admitting: *Deleted

## 2014-07-27 ENCOUNTER — Ambulatory Visit: Payer: 59 | Admitting: Physician Assistant

## 2014-07-27 ENCOUNTER — Telehealth: Payer: Self-pay | Admitting: *Deleted

## 2014-07-27 ENCOUNTER — Other Ambulatory Visit (HOSPITAL_BASED_OUTPATIENT_CLINIC_OR_DEPARTMENT_OTHER): Payer: 59

## 2014-07-27 DIAGNOSIS — R911 Solitary pulmonary nodule: Secondary | ICD-10-CM

## 2014-07-27 DIAGNOSIS — D589 Hereditary hemolytic anemia, unspecified: Secondary | ICD-10-CM

## 2014-07-27 LAB — CBC & DIFF AND RETIC
BASO%: 0.4 % (ref 0.0–2.0)
Basophils Absolute: 0 10*3/uL (ref 0.0–0.1)
EOS%: 2.6 % (ref 0.0–7.0)
Eosinophils Absolute: 0.1 10*3/uL (ref 0.0–0.5)
HCT: 24.8 % — ABNORMAL LOW (ref 38.4–49.9)
HGB: 7.7 g/dL — ABNORMAL LOW (ref 13.0–17.1)
Immature Retic Fract: 40.5 % — ABNORMAL HIGH (ref 3.00–10.60)
LYMPH%: 29.4 % (ref 14.0–49.0)
MCH: 25.1 pg — ABNORMAL LOW (ref 27.2–33.4)
MCHC: 31 g/dL — ABNORMAL LOW (ref 32.0–36.0)
MCV: 80.8 fL (ref 79.3–98.0)
MONO#: 0.5 10*3/uL (ref 0.1–0.9)
MONO%: 9 % (ref 0.0–14.0)
NEUT#: 3.2 10*3/uL (ref 1.5–6.5)
NEUT%: 58.6 % (ref 39.0–75.0)
Platelets: 192 10*3/uL (ref 140–400)
RBC: 3.07 10*6/uL — ABNORMAL LOW (ref 4.20–5.82)
RDW: 39.5 % — ABNORMAL HIGH (ref 11.0–14.6)
Retic %: 9.62 % — ABNORMAL HIGH (ref 0.80–1.80)
Retic Ct Abs: 295.33 10*3/uL — ABNORMAL HIGH (ref 34.80–93.90)
WBC: 5.5 10*3/uL (ref 4.0–10.3)
lymph#: 1.6 10*3/uL (ref 0.9–3.3)

## 2014-07-27 LAB — TECHNOLOGIST REVIEW

## 2014-07-27 NOTE — Telephone Encounter (Signed)
Patient had a CT scan of the abdomen and pelvis in February and it was recommended for the patient to have a follow up PET scan. Patient aware will place the order and send to pool for scheduling.

## 2014-07-28 ENCOUNTER — Other Ambulatory Visit: Payer: Self-pay | Admitting: *Deleted

## 2014-07-28 ENCOUNTER — Telehealth: Payer: Self-pay | Admitting: *Deleted

## 2014-07-28 DIAGNOSIS — D589 Hereditary hemolytic anemia, unspecified: Secondary | ICD-10-CM

## 2014-07-28 LAB — CULTURE, BLOOD (SINGLE): Organism ID, Bacteria: NO GROWTH

## 2014-07-28 NOTE — Telephone Encounter (Signed)
Called David [name] with CBC results.  He said he is a little more fatigued than usual, but not to the point that he feels he needs a transfusion.  He had a nose bleed that lasted > 30 minutes this morning.  To be safe, he will come in on Monday morning for a CBC and Type and Hold at 8:30.  He is scheduled to see Dr. Burr Medico on 08/04/14.

## 2014-07-29 ENCOUNTER — Ambulatory Visit (HOSPITAL_COMMUNITY)
Admission: RE | Admit: 2014-07-29 | Discharge: 2014-07-29 | Disposition: A | Discharge status: Home / Self Care | Payer: 59 | Source: Ambulatory Visit | Attending: Hematology | Admitting: Hematology

## 2014-07-29 ENCOUNTER — Telehealth: Payer: Self-pay

## 2014-07-29 NOTE — Telephone Encounter (Signed)
-----   Message from Kellie Moor, RN sent at 06/03/2014  1:38 PM EST ----- Needs h pylori stool antigen- see results on path from 06/03/14.  Orders are in please notify the patient

## 2014-07-29 NOTE — Telephone Encounter (Signed)
Pt aware.

## 2014-07-30 ENCOUNTER — Encounter (HOSPITAL_COMMUNITY): Payer: Self-pay

## 2014-07-30 ENCOUNTER — Emergency Department (HOSPITAL_COMMUNITY)
Admission: EM | Admit: 2014-07-30 | Discharge: 2014-07-30 | Disposition: A | Discharge status: Home / Self Care | Payer: 59 | Attending: Emergency Medicine | Admitting: Emergency Medicine

## 2014-07-30 DIAGNOSIS — R0602 Shortness of breath: Secondary | ICD-10-CM | POA: Diagnosis not present

## 2014-07-30 DIAGNOSIS — Z79899 Other long term (current) drug therapy: Secondary | ICD-10-CM | POA: Diagnosis not present

## 2014-07-30 DIAGNOSIS — K219 Gastro-esophageal reflux disease without esophagitis: Secondary | ICD-10-CM | POA: Insufficient documentation

## 2014-07-30 DIAGNOSIS — D649 Anemia, unspecified: Secondary | ICD-10-CM

## 2014-07-30 DIAGNOSIS — Z8679 Personal history of other diseases of the circulatory system: Secondary | ICD-10-CM | POA: Insufficient documentation

## 2014-07-30 DIAGNOSIS — Z8546 Personal history of malignant neoplasm of prostate: Secondary | ICD-10-CM | POA: Insufficient documentation

## 2014-07-30 DIAGNOSIS — D591 Other autoimmune hemolytic anemias: Secondary | ICD-10-CM | POA: Diagnosis not present

## 2014-07-30 DIAGNOSIS — Z87891 Personal history of nicotine dependence: Secondary | ICD-10-CM | POA: Insufficient documentation

## 2014-07-30 DIAGNOSIS — R5383 Other fatigue: Secondary | ICD-10-CM | POA: Diagnosis present

## 2014-07-30 DIAGNOSIS — Z8619 Personal history of other infectious and parasitic diseases: Secondary | ICD-10-CM | POA: Insufficient documentation

## 2014-07-30 LAB — CBC WITH DIFFERENTIAL/PLATELET
Basophils Absolute: 0 10*3/uL (ref 0.0–0.1)
Basophils Relative: 0 % (ref 0–1)
Eosinophils Absolute: 0.1 10*3/uL (ref 0.0–0.7)
Eosinophils Relative: 2 % (ref 0–5)
HCT: 24.1 % — ABNORMAL LOW (ref 39.0–52.0)
Hemoglobin: 7.4 g/dL — ABNORMAL LOW (ref 13.0–17.0)
Lymphocytes Relative: 26 % (ref 12–46)
Lymphs Abs: 1.5 10*3/uL (ref 0.7–4.0)
MCH: 24.4 pg — ABNORMAL LOW (ref 26.0–34.0)
MCHC: 30.7 g/dL (ref 30.0–36.0)
MCV: 79.5 fL (ref 78.0–100.0)
Monocytes Absolute: 0.5 10*3/uL (ref 0.1–1.0)
Monocytes Relative: 8 % (ref 3–12)
Neutro Abs: 3.6 10*3/uL (ref 1.7–7.7)
Neutrophils Relative %: 64 % (ref 43–77)
Platelets: 205 10*3/uL (ref 150–400)
RBC: 3.03 MIL/uL — ABNORMAL LOW (ref 4.22–5.81)
RDW: 40.4 % — ABNORMAL HIGH (ref 11.5–15.5)
WBC: 5.7 10*3/uL (ref 4.0–10.5)

## 2014-07-30 LAB — BASIC METABOLIC PANEL
Anion gap: 9 (ref 5–15)
BUN: 8 mg/dL (ref 6–23)
CO2: 23 mmol/L (ref 19–32)
Calcium: 9.1 mg/dL (ref 8.4–10.5)
Chloride: 106 mmol/L (ref 96–112)
Creatinine, Ser: 1.25 mg/dL (ref 0.50–1.35)
GFR calc Af Amer: 74 mL/min — ABNORMAL LOW (ref >90)
GFR calc non Af Amer: 64 mL/min — ABNORMAL LOW (ref >90)
Glucose, Bld: 99 mg/dL (ref 70–99)
Potassium: 3.9 mmol/L (ref 3.5–5.1)
Sodium: 138 mmol/L (ref 135–145)

## 2014-07-30 LAB — PREPARE RBC (CROSSMATCH)

## 2014-07-30 LAB — SAMPLE TO BLOOD BANK

## 2014-07-30 MED ORDER — SODIUM CHLORIDE 0.9 % IV SOLN
Freq: Once | INTRAVENOUS | Status: AC
  Administered 2014-07-30: 22:00:00 via INTRAVENOUS

## 2014-07-30 NOTE — ED Notes (Signed)
Pt was seen at cancer center 07-27-14, hemoglobin was 7.7, since patient has been feeling fatigued with some mild shortness of breath.  Pt thinks hemoglobin has dropped.  Pt gets transfused when hemoglobin is less than 7.4.

## 2014-07-30 NOTE — Discharge Instructions (Signed)
Anemia, Nonspecific Anemia is a condition in which the concentration of red blood cells or hemoglobin in the blood is below normal. Hemoglobin is a substance in red blood cells that carries oxygen to the tissues of the body. Anemia results in not enough oxygen reaching these tissues.  CAUSES  Common causes of anemia include:   Excessive bleeding. Bleeding may be internal or external. This includes excessive bleeding from periods (in women) or from the intestine.   Poor nutrition.   Chronic kidney, thyroid, and liver disease.  Bone marrow disorders that decrease red blood cell production.  Cancer and treatments for cancer.  HIV, AIDS, and their treatments.  Spleen problems that increase red blood cell destruction.  Blood disorders.  Excess destruction of red blood cells due to infection, medicines, and autoimmune disorders. SIGNS AND SYMPTOMS   Minor weakness.   Dizziness.   Headache.  Palpitations.   Shortness of breath, especially with exercise.   Paleness.  Cold sensitivity.  Indigestion.  Nausea.  Difficulty sleeping.  Difficulty concentrating. Symptoms may occur suddenly or they may develop slowly.  DIAGNOSIS  Additional blood tests are often needed. These help your health care provider determine the best treatment. Your health care provider will check your stool for blood and look for other causes of blood loss.  TREATMENT  Treatment varies depending on the cause of the anemia. Treatment can include:   Supplements of iron, vitamin B12, or folic acid.   Hormone medicines.   A blood transfusion. This may be needed if blood loss is severe.   Hospitalization. This may be needed if there is significant continual blood loss.   Dietary changes.  Spleen removal. HOME CARE INSTRUCTIONS Keep all follow-up appointments. It often takes many weeks to correct anemia, and having your health care provider check on your condition and your response to  treatment is very important. SEEK IMMEDIATE MEDICAL CARE IF:   You develop extreme weakness, shortness of breath, or chest pain.   You become dizzy or have trouble concentrating.  You develop heavy vaginal bleeding.   You develop a rash.   You have bloody or black, tarry stools.   You faint.   You vomit up blood.   You vomit repeatedly.   You have abdominal pain.  You have a fever or persistent symptoms for more than 2-3 days.   You have a fever and your symptoms suddenly get worse.   You are dehydrated.  MAKE SURE YOU:  Understand these instructions.  Will watch your condition.  Will get help right away if you are not doing well or get worse. Document Released: 05/23/2004 Document Revised: 12/16/2012 Document Reviewed: 10/09/2012 [employer] Patient Information 2015 [employer]. This information is not intended to replace advice given to you by your health care provider. Make sure you discuss any questions you have with your health care provider.  

## 2014-07-30 NOTE — ED Provider Notes (Signed)
Patient has felt fatigued and tired for 1 day. He denies any chest pain denies any shortness of breath no other symptoms. No treatment prior to coming here. On exam alert no distress lungs clear auscultation heart regular rate and rhythm, mildly tachycardic  Orlie Dakin, MD 07/30/14 1856

## 2014-07-30 NOTE — ED Provider Notes (Signed)
CSN: [id number]     Arrival date & time 07/30/14  1526 History   First MD Initiated Contact with Patient 07/30/14 1618     Chief Complaint  Patient presents with  . Fatigue     (Consider location/radiation/quality/duration/timing/severity/associated sxs/prior Treatment) HPI  Pt is a 54yo male with hx of endocarditis in 2009 which pt states pt needs his mitral valve replaced but until then, he has been getting blood transfusion about once a month.  Last transfusion was 07/13/14.  Pt states he had his Hgb checked on Wednesday, 3/30 and it was 7.7. Within the last 2 days, pt has experienced worsening generalized fatigue with mild DOE and SOB.  Pt states he normally gets transfused when he is less than 7.4 and feels like he may be due for another transfusion. Last transfusion was performed at the cancer center.  Denies fever, chills, chest pain, abdominal pain, n/v/d. Pt is mildly tachycardic in triage, pt states this is normal for him, however, HR from last month was in 70s-90s.    PCP: Dr. Thomasenia Sales   Past Medical History  Diagnosis Date  . GERD (gastroesophageal reflux disease)   . Endocarditis 10-1999     RECURRENCE 02-2008  . History of chest tube placement 1980    R SIDE FOR SPONTANEOUS  PNEUMOTHORAX  . Periodontitis     PMH  . C. difficile colitis 2009  . External hemorrhoids without mention of complication 09/73/5329    Colonoscopy  . History of blood transfusion 02/2008, 04/2014.     for anemia.   . Prostate cancer     "5 of the samples from the biopsy was positive; Greeson # is 6"  . DDD (degenerative disc disease), lumbar   . Anemia 2009, 04/2014.    Past Surgical History  Procedure Laterality Date  . Colonoscopy  2005, 03/2012    external rrhoids 2005, normal exam 2013; [facility]  . Multiple tooth extractions  2009    "to prep for mitral valve repair"  . Mitral valve repair  2009    via a mini thoracotomy  . Prostate biopsy  03/2014  . Cardiac catheterization   2009  . Esophagogastroduodenoscopy N/A 06/02/2014    Procedure: ESOPHAGOGASTRODUODENOSCOPY (EGD);  Surgeon: Jerene Bears, MD;  Location: [facility] ENDOSCOPY;  Service: Endoscopy;  Laterality: N/A;  . Tee without cardioversion N/A 07/19/2014    Procedure: TRANSESOPHAGEAL ECHOCARDIOGRAM (TEE);  Surgeon: Lelon Perla, MD;  Location: [facility] ENDOSCOPY;  Service: Cardiovascular;  Laterality: N/A;   Family History  Problem Relation Age of Onset  . Ovarian cancer Sister   . Diabetes Neg Hx   . Heart disease Neg Hx   . Stroke Neg Hx   . Alzheimer's disease Father    History  Substance Use Topics  . Smoking status: Former Smoker -- 2.00 packs/day for 15 years    Types: Cigarettes    Quit date: 08/08/1989  . Smokeless tobacco: Never Used  . Alcohol Use: No     Comment: "stopped drinking in 1991; never treated for abuse"    Review of Systems  Constitutional: Positive for fatigue. Negative for fever, chills and appetite change.  Respiratory: Positive for shortness of breath. Negative for cough.   Cardiovascular: Negative for chest pain, palpitations and leg swelling.  Gastrointestinal: Negative for nausea, vomiting, abdominal pain and diarrhea.  Genitourinary: Negative for dysuria, hematuria and flank pain.  Musculoskeletal: Negative for myalgias, arthralgias and gait problem.  Skin: Negative for color change.  Neurological: Positive  for weakness ( generalized). Negative for dizziness, seizures, syncope, light-headedness, numbness and headaches.  All other systems reviewed and are negative.     Allergies  Clarithromycin  Home Medications   Prior to Admission medications   Medication Sig Start Date End Date Taking? Authorizing Provider  folic acid (FOLVITE) 1 MG tablet Take 1 tablet (1 mg total) by mouth daily. 07/08/14  Yes Truitt Merle, MD  loratadine (CLARITIN) 10 MG tablet Take 1 tablet (10 mg total) by mouth daily. 03/22/14  Yes Kaitlyn Szekalski, PA-C  pantoprazole (PROTONIX) 40 MG tablet  Take 1 tablet (40 mg total) by mouth daily. 06/02/14  Yes Verlee Monte, MD  potassium chloride SA (K-DUR,KLOR-CON) 20 MEQ tablet Take 2 tablets (40 mEq total) by mouth 2 (two) times daily. Patient taking differently: Take 40 mEq by mouth daily.  07/01/14  Yes Truitt Merle, MD   BP 107/59 mmHg  Pulse 94  Temp(Src) 98.8 F (37.1 C) (Oral)  Resp 15  Wt 157 lb 1.6 oz (71.26 kg)  SpO2 100% Physical Exam  Constitutional: He appears well-developed and well-nourished.  HENT:  Head: Normocephalic and atraumatic.  Eyes: Conjunctivae are normal. Scleral icterus is present.  Neck: Normal range of motion. Neck supple.  Cardiovascular: Normal rate, regular rhythm and normal heart sounds.   Pulmonary/Chest: Effort normal and breath sounds normal. No respiratory distress. He has no wheezes. He has no rales. He exhibits no tenderness.  Abdominal: Soft. Bowel sounds are normal. He exhibits no distension and no mass. There is no tenderness. There is no rebound and no guarding.  Musculoskeletal: Normal range of motion.  Neurological: He is alert.  Skin: Skin is warm and dry.  Nursing note and vitals reviewed.   ED Course  Procedures (including critical care time) Labs Review Labs Reviewed  BASIC METABOLIC PANEL - Abnormal; Notable for the following:    GFR calc non Af Amer 64 (*)    GFR calc Af Amer 74 (*)    All other components within normal limits  CBC WITH DIFFERENTIAL/PLATELET - Abnormal; Notable for the following:    RBC 3.03 (*)    Hemoglobin 7.4 (*)    HCT 24.1 (*)    MCH 24.4 (*)    RDW 40.4 (*)    All other components within normal limits  CBC WITH DIFFERENTIAL/PLATELET  SAMPLE TO BLOOD BANK  TYPE AND SCREEN  PREPARE RBC (CROSSMATCH)    Imaging Review No results found.   EKG Interpretation None      MDM   Final diagnoses:  Symptomatic anemia    Pt is a 54yo male with hx of endocarditis and mitral valve issues, in need of mitral valve replacement.  Hx of hemolytic anemia,  states he has a transfusion about once a month. Last transfusion 07/13/14 with 2 units with his Hgb at 7.4. Today he feels he may need another transfusion. On exam, pt appears well, mild tachycardia.   6:55 PM delay in patient care due to labs- CBC still pending.  Initial blood sample from triage was clotted so a second sample was sent.  RN called to check on labs, was advised CBC was in process.  Labs- Hgb: 7.4  Discussed results with pt who stated he would really like to have a transfusion and go home but will stay if he must be admitted for transfusion as he cannot see his PCP until Monday and does not want to continue getting weaker until then.   Discussed pt with Dr. Winfred Leeds, will  transfuse 1 Unit of PRBC which should increase Hgb to 8 so pt can be discharged home to f/u next week.  10:32 PM Pt has PRBC hanging. Pt states he feels well and feels comfortable being discharged home once transfusion is complete.   Pt completed his transfusion w/o any immediate complications. Agrees to f/u with PCP Monday. Home care instructions provided. Return precautions provided. Pt verbalized understanding and agreement with tx plan.    Noland Fordyce, PA-C 07/30/14 [place], MD 07/31/14 [phone]

## 2014-07-31 LAB — TYPE AND SCREEN
ABO/RH(D): O POS
Antibody Screen: NEGATIVE
Unit division: 0

## 2014-08-01 ENCOUNTER — Encounter: Payer: Self-pay | Admitting: *Deleted

## 2014-08-01 ENCOUNTER — Other Ambulatory Visit (HOSPITAL_BASED_OUTPATIENT_CLINIC_OR_DEPARTMENT_OTHER): Payer: 59

## 2014-08-01 DIAGNOSIS — D589 Hereditary hemolytic anemia, unspecified: Secondary | ICD-10-CM | POA: Diagnosis not present

## 2014-08-01 LAB — CBC WITH DIFFERENTIAL/PLATELET
BASO%: 0.4 % (ref 0.0–2.0)
Basophils Absolute: 0 10*3/uL (ref 0.0–0.1)
EOS%: 4 % (ref 0.0–7.0)
Eosinophils Absolute: 0.2 10*3/uL (ref 0.0–0.5)
HCT: 28.2 % — ABNORMAL LOW (ref 38.4–49.9)
HGB: 8.8 g/dL — ABNORMAL LOW (ref 13.0–17.1)
LYMPH%: 26.2 % (ref 14.0–49.0)
MCH: 25.3 pg — ABNORMAL LOW (ref 27.2–33.4)
MCHC: 31.2 g/dL — ABNORMAL LOW (ref 32.0–36.0)
MCV: 81 fL (ref 79.3–98.0)
MONO#: 0.6 10*3/uL (ref 0.1–0.9)
MONO%: 11.4 % (ref 0.0–14.0)
NEUT#: 2.9 10*3/uL (ref 1.5–6.5)
NEUT%: 58 % (ref 39.0–75.0)
Platelets: 209 10*3/uL (ref 140–400)
RBC: 3.48 10*6/uL — ABNORMAL LOW (ref 4.20–5.82)
RDW: 36.7 % — ABNORMAL HIGH (ref 11.0–14.6)
WBC: 5 10*3/uL (ref 4.0–10.3)
lymph#: 1.3 10*3/uL (ref 0.9–3.3)
nRBC: 2 % — ABNORMAL HIGH (ref 0–0)

## 2014-08-01 LAB — TECHNOLOGIST REVIEW

## 2014-08-01 LAB — HOLD TUBE, BLOOD BANK

## 2014-08-03 ENCOUNTER — Telehealth: Payer: Self-pay | Admitting: *Deleted

## 2014-08-03 ENCOUNTER — Institutional Professional Consult (permissible substitution) (INDEPENDENT_AMBULATORY_CARE_PROVIDER_SITE_OTHER): Payer: 59 | Admitting: Thoracic Surgery (Cardiothoracic Vascular Surgery)

## 2014-08-03 ENCOUNTER — Encounter: Payer: Self-pay | Admitting: Thoracic Surgery (Cardiothoracic Vascular Surgery)

## 2014-08-03 VITALS — BP 140/84 | HR 111 | Resp 20 | Ht 70.0 in | Wt 157.0 lb

## 2014-08-03 DIAGNOSIS — Z9889 Other specified postprocedural states: Secondary | ICD-10-CM | POA: Insufficient documentation

## 2014-08-03 DIAGNOSIS — I340 Nonrheumatic mitral (valve) insufficiency: Secondary | ICD-10-CM | POA: Insufficient documentation

## 2014-08-03 NOTE — Progress Notes (Signed)
David [address]       [name],David [name]             [phone]     CARDIOTHORACIC SURGERY CONSULTATION REPORT  Referring Provider is David [name], David Bors, MD PCP is David Cobble, MD  Chief Complaint  Patient presents with  . Mitral Regurgitation    HPI:  Patient is a 54 year old African-American male from [place] with history of mitral valve endocarditis in 2001 and recurrent endocarditis in 3244 complicated by congestive heart failure. He underwent mitral valve repair 03/22/2008 via right mini thoracotomy approach.  Findings at the time of surgery were notable for the presence of a large perforation in the anterior leaflet with active vegetations and a flail segment of the posterior leaflet (P3) with multiple ruptured primary chordae tendineae.  The patient recovered uneventfully and was discharged from the hospital on the fourth postoperative day. His subsequent convalescence was uneventful and follow-up echocardiogram performed 04/01/2008 revealed intact mitral valve repair with no residual mitral regurgitation.  The patient completely recovered and returned to normal physical activity without any limitations.  He was last seen in follow-up in our office on 11/23/2008.  The patient did very well until approximately 6 or 7 months ago when he first began to develop mild symptoms of fatigue. Symptoms gradually progressed and the patient began to experience some exertional shortness of breath and occasional pressure across his chest.  He was seen by his primary care physician (Dr. [name]) in early February, and routine blood work revealed a hemoglobin of 5.9. He was admitted to the hospital 05/31/2014 and transfused 2 units packed red blood cells with increase in hemoglobin to 9.0. The patient was treated with IV proton X and gastroenterology was consulted.  The patient's stool was Hemoccult negative.  EGD was performed and revealed no sign of GI bleeding. Patient was  noted to have had colonoscopy in 2013.  The patient was discharged from the hospital 06/02/2014 and followed closely by Dr. [name]. He reported intermittent night sweats with recurrent exertional shortness breath and chest pressure.  Follow-up blood work obtained 06/20/2014 revealed the patient's hemoglobin had dropped to again to 7.2.  He was readmitted to the hospital 06/21/2014 and transfused another 2 units of PRBCs.  He was seen in consultation by Dr. Burr Medico from hematology. His hemoglobin increased appropriately with transfusion and the patient was discharged home.  Subsequent lab workup confirmed the patient had findings consistent with severe hemolysis.  He was treated with a short course of high-dose prednisone for presumed autoimmune hemolytic anemia, but this has subsequently been stopped.  Cardiology consultation was requested and the patient was seen in the office by Dr. Acie Fredrickson. He was noted have a systolic murmur on exam. Transesophageal echocardiogram was performed 07/19/2014 by Dr. Stanford [name]. This revealed the presence of severe mitral regurgitation. The patient has been referred for surgical consultation.  The patient is married and lives locally in [place] with his wife and 2 of his adult children. Until recently has lived an active lifestyle. He describes a several month history of progressive fatigue with more recent development of exertional shortness of breath. He denies any history of resting shortness of breath, PND, orthopnea, or lower extremity edema. He has not had dizzy spells or palpitations. He denies any fevers or chills. He has had some night sweats. Appetite is normal. He states that he lost some weight but has been gaining it back. He denies any arthritis or arthralgias.  Past Medical History  Diagnosis Date  . GERD (gastroesophageal reflux disease)   . Endocarditis 10-1999     RECURRENCE 02-2008  . History of chest tube placement 1980    R SIDE FOR SPONTANEOUS   PNEUMOTHORAX  . Periodontitis     PMH  . C. difficile colitis 2009  . External hemorrhoids without mention of complication 37/85/8850    Colonoscopy  . History of blood transfusion 02/2008, 04/2014.     for anemia.   . Prostate cancer     "5 of the samples from the biopsy was positive; Greeson # is 6"  . DDD (degenerative disc disease), lumbar   . Anemia 2009, 04/2014.   . Mitral regurgitation 07/19/2014    Recurrent severe mitral regurgitation s/p mitral valve repair 2009  . Hemolytic anemia 06/29/2014    Past Surgical History  Procedure Laterality Date  . Colonoscopy  2005, 03/2012    external rrhoids 2005, normal exam 2013; [facility]  . Multiple tooth extractions  2009    "to prep for mitral valve repair"  . Mitral valve repair  2009    via a mini thoracotomy  . Prostate biopsy  03/2014  . Cardiac catheterization  2009  . Esophagogastroduodenoscopy N/A 06/02/2014    Procedure: ESOPHAGOGASTRODUODENOSCOPY (EGD);  Surgeon: Jerene Bears, MD;  Location: [facility] ENDOSCOPY;  Service: Endoscopy;  Laterality: N/A;  . Tee without cardioversion N/A 07/19/2014    Procedure: TRANSESOPHAGEAL ECHOCARDIOGRAM (TEE);  Surgeon: Lelon Perla, MD;  Location: [facility] ENDOSCOPY;  Service: Cardiovascular;  Laterality: N/A;    Family History  Problem Relation Age of Onset  . Ovarian cancer Sister   . Diabetes Neg Hx   . Heart disease Neg Hx   . Stroke Neg Hx   . Alzheimer's disease Father     History   Social History  . Marital Status: Married    Spouse Name: N/A  . Number of Children: N/A  . Years of Education: N/A   Occupational History  . 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 newspaper delivery     delivers papers to distributors, part time on weekends.    Social History Main Topics  . Smoking status: Former Smoker -- 2.00 packs/day for 15 years    Types: Cigarettes    Quit date: 08/08/1989  . Smokeless tobacco: Never Used  . Alcohol Use: No     Comment: "stopped  drinking in 1991; never treated for abuse"  . Drug Use: No     Comment: "stopped using drugs in ~ 1984"  . Sexual Activity: No   Other Topics Concern  . Not on file   Social History Narrative   REGULAR EXERCISE 2X [employer] TREADMILL,WEIGHTS    Current Outpatient Prescriptions  Medication Sig Dispense Refill  . folic acid (FOLVITE) 1 MG tablet Take 1 tablet (1 mg total) by mouth daily. 30 tablet 3  . loratadine (CLARITIN) 10 MG tablet Take 1 tablet (10 mg total) by mouth daily. 30 tablet 3  . potassium chloride SA (K-DUR,KLOR-CON) 20 MEQ tablet Take 2 tablets (40 mEq total) by mouth 2 (two) times daily. (Patient not taking: Reported on 08/03/2014) 30 tablet 2   No current facility-administered medications for this visit.    Allergies  Allergen Reactions  . Clarithromycin     REACTION: NAUSEA      Review of Systems:   General:  normal appetite, decreased energy, no weight gain, + weight loss, no fever  Cardiac:  no chest pain with exertion, occasional mild chest pain at rest, + SOB with exertion, no resting SOB, no PND, no orthopnea, no palpitations, no arrhythmia, no atrial fibrillation, no LE edema, no dizzy spells, no syncope  Respiratory:  no shortness of breath, no home oxygen, no productive cough, no dry cough, no bronchitis, no wheezing, no hemoptysis, no asthma, no pain with inspiration or cough, no sleep apnea, no CPAP at night  GI:   no difficulty swallowing, no reflux, no frequent heartburn, no hiatal hernia, no abdominal pain, no constipation, no diarrhea, no hematochezia, no hematemesis, no melena  GU:   no dysuria,  no frequency, no urinary tract infection, no hematuria, no enlarged prostate, no kidney stones, no kidney disease  Vascular:  no pain suggestive of claudication, no pain in feet, no leg cramps, no varicose veins, no DVT, no non-healing foot ulcer  Neuro:   no stroke, no TIA's, no seizures, no headaches, no temporary blindness one eye,  no slurred speech, no  peripheral neuropathy, no chronic pain, no instability of gait, no memory/cognitive dysfunction  Musculoskeletal: no arthritis, no joint swelling, no myalgias, no difficulty walking, normal mobility   Skin:   no rash, no itching, no skin infections, no pressure sores or ulcerations  Psych:   no anxiety, no depression, no nervousness, no unusual recent stress  Eyes:   no blurry vision, no floaters, no recent vision changes, + wears glasses or contacts  ENT:   no hearing loss, no loose or painful teeth, no dentures, last saw dentist within the past 6 months  Hematologic:  no easy bruising, no abnormal bleeding, no clotting disorder, no frequent epistaxis  Endocrine:  no diabetes, does not check CBG's at home     Physical Exam:   BP 140/84 mmHg  Pulse 111  Resp 20  Ht 5\' 10"  (1.778 m)  Wt 157 lb (71.215 kg)  BMI 22.53 kg/m2  SpO2 98%  General:  Thin,  well-appearing  HEENT:  Unremarkable   Neck:   no JVD, no bruits, no adenopathy   Chest:   clear to auscultation, symmetrical breath sounds, no wheezes, no rhonchi   CV:   RRR, grade III/VI somewhat high pitched holosystolic murmur   Abdomen:  soft, non-tender, no masses   Extremities:  warm, well-perfused, pulses palpable, no LE edema  Rectal/GU  Deferred  Neuro:   Grossly non-focal and symmetrical throughout  Skin:   Clean and dry, no rashes, no breakdown, no peripheral signs of endocarditis   Diagnostic Tests:  Hemolysis Labs:   Results for David, [name] (MRN [id number]) as of 08/03/2014 18:53  Ref. Range 06/22/2014 01:28  Haptoglobin Latest Range: 43-212 mg/dL <10 (L)   Results for David, [name] (MRN [id number]) as of 08/03/2014 18:53  Ref. Range 06/23/2014 05:02  Hgb, Plasma Latest Range: 0.0-8.3 mg/dL 62.9 (H)   Results for David, [name] (MRN [id number]) as of 08/03/2014 18:53  Ref. Range 07/08/2014 10:24  Immature Retic Fract Latest Range: 3.00-10.60 % 43.40 (H)      Transthoracic Echocardiography  Patient:  David [name], David [name] MR #:    [id number] Study Date: 06/22/2014 Gender:   M Age:    76 Height:   177.8 cm Weight:   69.4 kg BSA:    1.85 m^2 Pt. Status: Room:    [place], [place] ADMITTING  Ivor Costa [id number] Veto Kemps [id number] SONOGRAPHER Johny Chess, RDCS, CCT PERFORMING  [facility], Inpatient  cc:  -------------------------------------------------------------------  LV EF: 60% -  65%  ------------------------------------------------------------------- History:  PMH: Mitral valve repair. Risk factors: Prostate cancer. GERD. Dyslipidemia.  ------------------------------------------------------------------- Study Conclusions  - Left ventricle: The cavity size was normal. Wall thickness was normal. Systolic function was normal. The estimated ejection fraction was in the range of 60% to 65%. Wall motion was normal; there were no regional wall motion abnormalities. - Mitral valve: Prior procedures included surgical repair. There was severe regurgitation. Valve area by continuity equation (using LVOT flow): 1.02 cm^2. - Left atrium: The atrium was moderately dilated. - Right atrium: The atrium was mildly dilated. - Pulmonic valve: There was moderate regurgitation. - Pulmonary arteries: Systolic pressure was mildly increased. PA peak pressure: 39 mm Hg (S).  Transthoracic echocardiography. M-mode, complete 2D, spectral Doppler, and color Doppler. Birthdate: Patient birthdate: 10-27-1960. Age: Patient is 54 yr old. Sex: Gender: male. BMI: 22 kg/m^2. Blood pressure:   105/71 Patient status: Inpatient. Study date: Study date: 06/22/2014. Study time: 10:08 AM. Location: Bedside.  -------------------------------------------------------------------  ------------------------------------------------------------------- Left ventricle: The cavity size was normal. Wall thickness was normal.  Systolic function was normal. The estimated ejection fraction was in the range of 60% to 65%. Wall motion was normal; there were no regional wall motion abnormalities.  ------------------------------------------------------------------- Aortic valve:  Structurally normal valve.  Cusp separation was normal. Doppler: Transvalvular velocity was within the normal range. There was no stenosis. There was no regurgitation.  ------------------------------------------------------------------- Aorta: Aortic root: The aortic root was normal in size. Ascending aorta: The ascending aorta was normal in size.  ------------------------------------------------------------------- Mitral valve: There is severe MR. There is a measured mitral valve gradient that is likely due to the severe MR. I do not think that he has significant mitral stenosis. Suggest TEE for further evaluation of the mitral valve if clinically indicated. Moderately thickened leaflets . Prior procedures included surgical repair. Doppler: There was severe regurgitation.  Valve area by pressure half-time: 2.97 cm^2. Indexed valve area by pressure half-time: 1.61 cm^2/m^2. Valve area by continuity equation (using LVOT flow): 1.02 cm^2. Indexed valve area by continuity equation (using LVOT flow): 0.55 cm^2/m^2.  Mean gradient (D): 17 mm Hg. Peak gradient (D): 17 mm Hg.  ------------------------------------------------------------------- Left atrium: The atrium was moderately dilated.  ------------------------------------------------------------------- Right ventricle: The cavity size was normal. Systolic function was normal.  ------------------------------------------------------------------- Pulmonic valve:  The valve appears to be grossly normal. Doppler: There was moderate regurgitation.  ------------------------------------------------------------------- Tricuspid valve:  Structurally normal valve.  Leaflet  separation was normal. Doppler: Transvalvular velocity was within the normal range. There was mild regurgitation.  ------------------------------------------------------------------- Pulmonary artery:  Systolic pressure was mildly increased.  ------------------------------------------------------------------- Right atrium: There is a prominent Chiari network visible in the RA The atrium was mildly dilated. There was the appearance of a Chiari network.  ------------------------------------------------------------------- Pericardium: There was no pericardial effusion.  ------------------------------------------------------------------- Systemic veins: Inferior vena cava: The vessel was normal in size. The respirophasic diameter changes were in the normal range (= 50%), consistent with normal central venous pressure.  ------------------------------------------------------------------- Measurements  Left ventricle              Value     Reference LV ID, ED, PLAX chordal          47.8 mm    43 - 52 LV ID, ES, PLAX chordal          29.4 mm    23 - 38 LV fx shortening, PLAX chordal      38  %    >=29 LV PW  thickness, ED            11.1 mm    --------- IVS/LV PW ratio, ED            0.87      <=1.3 Stroke volume, 2D             84  ml    --------- Stroke volume/bsa, 2D           45  ml/m^2  ---------  Ventricular septum            Value     Reference IVS thickness, ED             9.67 mm    ---------  LVOT                   Value     Reference LVOT ID, S                21  mm    --------- LVOT area                 3.46 cm^2   --------- LVOT peak velocity, S           137  cm/s   --------- LVOT mean velocity, S           84.7  cm/s   --------- LVOT VTI, S                24.2 cm    --------- LVOT peak gradient, S           8   mm Hg  ---------  Aorta                   Value     Reference Aortic root ID, ED            31  mm    ---------  Left atrium                Value     Reference LA ID, A-P, ES              51  mm    --------- LA ID/bsa, A-P          (H)   2.76 cm/m^2  <=2.2 LA volume, S               91  ml    --------- LA volume/bsa, S             49.2 ml/m^2  --------- LA volume, ES, 1-p A4C          77  ml    --------- LA volume/bsa, ES, 1-p A4C        41.6 ml/m^2  --------- LA volume, ES, 1-p A2C          105  ml    --------- LA volume/bsa, ES, 1-p A2C        56.8 ml/m^2  ---------  Mitral valve               Value     Reference Mitral E-wave peak velocity        204  cm/s   --------- Mitral A-wave peak velocity        162  cm/s   --------- Mitral mean velocity, D          194  cm/s   --------- Mitral deceleration time     (H)   285  ms    150 - 230 Mitral pressure half-time         79  ms    --------- Mitral mean gradient, D          17  mm Hg  --------- Mitral peak gradient, D          17  mm Hg  --------- Mitral E/A ratio, peak          1.3      --------- Mitral valve area, PHT, DP        2.97 cm^2   --------- Mitral valve area/bsa, PHT, DP      1.61 cm^2/m^2 --------- Mitral valve area, LVOT          1.02 cm^2   --------- continuity Mitral valve area/bsa, LVOT        0.55 cm^2/m^2 --------- continuity Mitral annulus VTI, D           82  cm    --------- Mitral maximal regurg velocity,       463  cm/s   --------- PISA Mitral regurg VTI, PISA          116  cm    ---------  Pulmonary arteries            Value     Reference PA pressure, S, DP        (H)   39  mm Hg  <=30  Tricuspid valve              Value     Reference Tricuspid regurg peak velocity      302  cm/s   --------- Tricuspid peak RV-RA gradient       36  mm Hg  ---------  Systemic veins              Value     Reference Estimated CVP               3   mm Hg  ---------  Right ventricle              Value     Reference RV pressure, S, DP        (H)   39  mm Hg  <=30 RV s&', lateral, S             14.6 cm/s   ---------  Legend: (L) and (H) mark values outside specified reference range.  ------------------------------------------------------------------- Prepared and Electronically Authenticated by  Mertie Moores, M.D. 2016-02-24T12:19:00    Transesophageal Echocardiography  Patient:  David [name], David [name] MR #:    [id number] Study Date: 07/19/2014 Gender:   M Age:    58 Height:   152.4 cm Weight:   70 kg BSA:    1.75 m^2 Pt. Status: Room:  ADMITTING  Kirk Ruths ATTENDING  Kirk Ruths ORDERING   Kirk Ruths PERFORMING  Kirk Ruths Chelyan Crenshaw SONOGRAPHER Leavy Cella  cc:  ------------------------------------------------------------------- LV EF: 55% -  60%  ------------------------------------------------------------------- Indications:   424.0 Mitral valve disease.  ------------------------------------------------------------------- Study Conclusions  - Left ventricle: Systolic function was normal. The estimated ejection fraction was in the range of 55% to 60%. Wall motion was normal; there were no regional wall motion  abnormalities. - Mitral valve: Prior procedures included surgical repair. There was severe regurgitation. - Left atrium: The atrium was mildly dilated. No evidence of thrombus in the atrial cavity or appendage. - Right atrium: No evidence of thrombus in the atrial cavity or appendage. -  Atrial septum: No defect or patent foramen ovale was identified.  Impressions:  - Normal LV function; s/p MV repair; possible perforated posterior MV leaflet; severe MR directed posterolaterally with additional smaller second jet.  Diagnostic transesophageal echocardiography. 2D and color Doppler. Birthdate: Patient birthdate: 1960/05/16. Age: Patient is 54 yr old. Sex: Gender: male.  BMI: 30.1 kg/m^2. Blood pressure: 113/76 Patient status: Inpatient. Study date: Study date: 07/19/2014. Study time: 10:30 AM. Location: Endoscopy.  -------------------------------------------------------------------  ------------------------------------------------------------------- Left ventricle: Systolic function was normal. The estimated ejection fraction was in the range of 55% to 60%. Wall motion was normal; there were no regional wall motion abnormalities.  ------------------------------------------------------------------- Aortic valve:  Structurally normal valve. Trileaflet; normal thickness leaflets. Cusp separation was normal. Doppler: There was no significant regurgitation.  ------------------------------------------------------------------- Aorta: Descending aorta: The descending aorta had mild diffuse disease.  ------------------------------------------------------------------- Mitral valve: Prior procedures included surgical repair. Leaflet separation was normal. Doppler: There was severe regurgitation. Mean gradient (D): 12 mm Hg.  ------------------------------------------------------------------- Left atrium: The atrium was mildly dilated. No evidence  of thrombus in the atrial cavity or appendage.  ------------------------------------------------------------------- Atrial septum: No defect or patent foramen ovale was identified.  ------------------------------------------------------------------- Right ventricle: The cavity size was normal. Systolic function was normal.  ------------------------------------------------------------------- Pulmonic valve:  Structurally normal valve.  Doppler: There was mild regurgitation.  ------------------------------------------------------------------- Tricuspid valve:  Structurally normal valve.  Leaflet separation was normal. Doppler: There was trivial regurgitation.  ------------------------------------------------------------------- Right atrium: The atrium was normal in size. There was the appearance of a Chiari network. No evidence of thrombus in the atrial cavity or appendage.  ------------------------------------------------------------------- Pericardium: There was no pericardial effusion.  ------------------------------------------------------------------- Measurements  Mitral valve         Value Mitral mean velocity, D   163  cm/s Mitral mean gradient, D   12  mm Hg Mitral annulus VTI, D    57.9 cm  Legend: (L) and (H) mark values outside specified reference range.  ------------------------------------------------------------------- Prepared and Electronically Authenticated by  Kirk Ruths 2016-03-22T18:59:43        CT CHEST, ABDOMEN, AND PELVIS WITH CONTRAST  TECHNIQUE: Multidetector CT imaging of the chest, abdomen and pelvis was performed following the standard protocol during bolus administration of intravenous contrast.  CONTRAST: 154mL OMNIPAQUE IOHEXOL 300 MG/ML SOLN  COMPARISON: None.  FINDINGS: CT CHEST FINDINGS  Thoracic inlet: No neck base or axillary masses. No adenopathy.  Mediastinum  and hila: Changes from mitral valve replacement. Mild left atrial enlargement. Overall heart size normal. No mediastinal or hilar masses or pathologically enlarged lymph nodes.  Lungs and pleura: 10 mm nodule in the peripheral right middle lobe just below the minor fissure. Tiny peripheral nodule in the left lower lobe, image 53, series 3. There is peripheral reticular opacity in the inferior right upper lobe, right middle lobe and both posterior lateral lower lobes. This is likely combination of mild interstitial scarring and subsegmental atelectasis. Minimal pleural effusions are noted. No pneumothorax.  CT ABDOMEN AND PELVIS FINDINGS  Normal liver, spleen and gallbladder. No bile duct dilation.  Low-attenuation areas into pancreatic head without a convincing discrete mass. This may be due to beam hardening artifact from adjacent dense colonic contrast. No other evidence of a pancreatic abnormality.  Normal adrenal glands. Normal kidneys and ureters. Bladder mildly thick-walled. No bladder mass or stone.  Trace free fluid noted in the posterior pelvis.  No pathologically enlarged intraperitoneal or retroperitoneal lymph nodes.  No bowel wall thickening or inflammatory changes. No bowel dilation. Normal appendix visualized.  MUSCULOSKELETAL  No osteoblastic or osteolytic lesions. Degenerative changes of the lumbar spine most evident at L5-S1  IMPRESSION: 1. No evidence of lymphadenopathy. 2. 10 mm nodule in the right middle lobe. Given the size of this nodule, biopsy or follow-up PET-CT should be considered. If these not performed, short-term follow-up with repeat CT in 3 months is recommended. 3. Area of hypoattenuation in the pancreatic head that may be artifact from dense adjacent colonic contrast. A mass is not excluded, but felt less likely. This could also be the assessed on short-term followup CT or pancreatic MRI with and without contrast. 4. Mild  wall thickening of the bladder. Consider cystitis if there are consistent symptoms. 5. Trace pelvic free fluid, nonspecific.   Electronically Signed  By: Lajean Manes M.D.  On: 06/22/2014 15:49   Impression:  Patient has stage D severe symptomatic recurrent mitral regurgitation associated with severe hemolysis and recent onset symptoms of exertional shortness of breath consistent with acute diastolic and just heart failure. I have personally reviewed the patient's recent transthoracic and transesophageal echocardiogram. There is severe mitral regurgitation with what may be a perforation of a segment of the posterior leaflet. Left ventricular size and systolic function remained normal. There are no obvious vegetations or other signs of active bacterial endocarditis. The patient has not had any clear symptoms or other physical signs of endocarditis with exception of occasional symptoms of "night sweats".  I agree the patient needs surgical intervention. The likelihood of successful and durable redo mitral valve repair is very unclear, and mitral valve replacement may be necessary.  Recent CT scan of the chest, abdomen, and pelvis has been reviewed. A small (10 mm) nodule was appreciated in the right middle lobe. This nodule was need to be followed but has benign characteristics. I do not favor proceeding with PET CT scan at this juncture due to the relatively small size of lesion. In addition, the "area of hypoattenuation" in the pancreatic head is most likely artifact. This can be followed up with repeat imaging in the future.   Plan:  I have discussed the indications, risks, and potential benefits of redo mitral valve repair or replacement with the patient in the office today.   The rationale for surgery has been explained, including a comparison between surgery and continued medical therapy with close follow-up.  The likelihood of successful and durable valve repair has been discussed with  particular reference to the findings of their recent echocardiogram.   In the event that their valve cannot be successfully repaired, we discussed the possibility of replacing the mitral valve using a mechanical prosthesis with the attendant need for long-term anticoagulation versus the alternative of replacing it using a bioprosthetic tissue valve with its potential for late structural valve deterioration and failure, depending upon the patient's longevity.  The patient specifically requests that if the mitral valve must be replaced that it be done using a mechanical valve.   The patient understands and accepts all potential risks of surgery including but not limited to risk of death, stroke or other neurologic complication, myocardial infarction, congestive heart failure, respiratory failure, renal failure, bleeding requiring transfusion and/or reexploration, arrhythmia, infection or other wound complications, pneumonia, pleural and/or pericardial effusion, pulmonary embolus, aortic dissection or other major vascular complication, or delayed complications related to valve repair or replacement including but not limited to structural valve deterioration and failure, thrombosis, embolization, endocarditis, or paravalvular leak.  All of their questions have been answered.  The patient will be admitted to  the cardiology service early next week for blood transfusion and diagnostic cardiac catheterization.  The patient will need to have multiple sets of blood cultures obtained at the time of admission.  We tend we plan to proceed with surgery on Thursday, 08/11/2014.   I spent in excess of 90 minutes during the conduct of this office consultation and >50% of this time involved direct face-to-face encounter with the patient for counseling and/or coordination of their care.   Valentina Gu. Roxy Manns, MD 08/03/2014 6:56 PM

## 2014-08-03 NOTE — Patient Instructions (Signed)
Schedule hospital admission for blood transfusion and cardiac catheterization through Dr Jacalyn Lefevre office

## 2014-08-04 ENCOUNTER — Ambulatory Visit: Payer: 59 | Admitting: Hematology

## 2014-08-04 ENCOUNTER — Telehealth: Payer: Self-pay | Admitting: *Deleted

## 2014-08-04 ENCOUNTER — Other Ambulatory Visit: Payer: 59

## 2014-08-04 ENCOUNTER — Encounter: Payer: Self-pay | Admitting: Cardiology

## 2014-08-04 NOTE — Telephone Encounter (Signed)
Spoke to pt regarding missed appt today with Dr. Burr Medico & he reports that he just forgot with so many appts.  He states he went to the ED 4/2 & received 1 unit of blood & last lab here, hgb was 8.8.  He is scheduled to have a transfusion on tues of next week with admission to hosp for surgery at [facility]  We will r/s appt with Dr Burr Medico after surgery.

## 2014-08-04 NOTE — Progress Notes (Signed)
Patient has been seen by Dr. Roxy Manns. He agrees patient will require mitral valve replacement. He continues to have hemolytic anemia requiring transfusion (felt secondary to mitral regurgitation from previous mitral valve repair; probable perforation of posterior MV leaflet). Our plan will be to admit to telemetry bed on April 12. We will transfuse 2 units of packed red blood cells. He will proceed with right and left cardiac catheterization on April 13. We will also repeat his hemoglobin at that time. Transfuse further as needed. He will undergo mitral valve replacement on April 14. Arrangements are being made for the above plan.  Kirk Ruths

## 2014-08-04 NOTE — Telephone Encounter (Signed)
Per dr Stanford Breed the patient will be admitted to the hosp on Tuesday next week for transfusion. He will then have a cath on Wednesday and valve replacement on Thursday. Dr Stanford Breed has spoken with the cardmaster in the hosp to help coordinate. Left message for pt to call

## 2014-08-04 NOTE — Telephone Encounter (Signed)
Spoke with pt, aware the hospital will call on Tuesday when a bed is available.

## 2014-08-05 ENCOUNTER — Telehealth: Payer: Self-pay | Admitting: Cardiology

## 2014-08-05 NOTE — Telephone Encounter (Signed)
Per Answering Service: Please call,concerning surgery that is coming up.

## 2014-08-05 NOTE — Telephone Encounter (Signed)
Questions answered regarding short term leave paperwork.

## 2014-08-08 ENCOUNTER — Ambulatory Visit (HOSPITAL_COMMUNITY): Admission: RE | Admit: 2014-08-08 | Payer: 59 | Source: Ambulatory Visit

## 2014-08-09 ENCOUNTER — Inpatient Hospital Stay (HOSPITAL_COMMUNITY)
Admission: RE | Admit: 2014-08-09 | Discharge: 2014-08-17 | DRG: 216 | Disposition: A | Discharge status: Home / Self Care | Payer: 59 | Source: Ambulatory Visit | Attending: Thoracic Surgery (Cardiothoracic Vascular Surgery) | Admitting: Thoracic Surgery (Cardiothoracic Vascular Surgery)

## 2014-08-09 ENCOUNTER — Other Ambulatory Visit: Payer: Self-pay | Admitting: *Deleted

## 2014-08-09 ENCOUNTER — Other Ambulatory Visit: Payer: Self-pay

## 2014-08-09 ENCOUNTER — Encounter (HOSPITAL_COMMUNITY): Payer: Self-pay | Admitting: *Deleted

## 2014-08-09 ENCOUNTER — Inpatient Hospital Stay (HOSPITAL_COMMUNITY): Payer: 59

## 2014-08-09 DIAGNOSIS — Z8546 Personal history of malignant neoplasm of prostate: Secondary | ICD-10-CM

## 2014-08-09 DIAGNOSIS — D62 Acute posthemorrhagic anemia: Secondary | ICD-10-CM | POA: Diagnosis not present

## 2014-08-09 DIAGNOSIS — C61 Malignant neoplasm of prostate: Secondary | ICD-10-CM | POA: Diagnosis not present

## 2014-08-09 DIAGNOSIS — R001 Bradycardia, unspecified: Secondary | ICD-10-CM | POA: Diagnosis not present

## 2014-08-09 DIAGNOSIS — D696 Thrombocytopenia, unspecified: Secondary | ICD-10-CM | POA: Diagnosis not present

## 2014-08-09 DIAGNOSIS — I340 Nonrheumatic mitral (valve) insufficiency: Secondary | ICD-10-CM | POA: Diagnosis present

## 2014-08-09 DIAGNOSIS — Z87891 Personal history of nicotine dependence: Secondary | ICD-10-CM

## 2014-08-09 DIAGNOSIS — I2510 Atherosclerotic heart disease of native coronary artery without angina pectoris: Secondary | ICD-10-CM | POA: Diagnosis not present

## 2014-08-09 DIAGNOSIS — J90 Pleural effusion, not elsewhere classified: Secondary | ICD-10-CM

## 2014-08-09 DIAGNOSIS — R0602 Shortness of breath: Secondary | ICD-10-CM | POA: Diagnosis present

## 2014-08-09 DIAGNOSIS — E871 Hypo-osmolality and hyponatremia: Secondary | ICD-10-CM | POA: Diagnosis not present

## 2014-08-09 DIAGNOSIS — Z9889 Other specified postprocedural states: Secondary | ICD-10-CM

## 2014-08-09 DIAGNOSIS — I5031 Acute diastolic (congestive) heart failure: Secondary | ICD-10-CM | POA: Diagnosis present

## 2014-08-09 DIAGNOSIS — I059 Rheumatic mitral valve disease, unspecified: Secondary | ICD-10-CM

## 2014-08-09 DIAGNOSIS — D649 Anemia, unspecified: Secondary | ICD-10-CM | POA: Diagnosis present

## 2014-08-09 DIAGNOSIS — Z954 Presence of other heart-valve replacement: Secondary | ICD-10-CM

## 2014-08-09 DIAGNOSIS — I272 Other secondary pulmonary hypertension: Secondary | ICD-10-CM | POA: Diagnosis present

## 2014-08-09 DIAGNOSIS — J9811 Atelectasis: Secondary | ICD-10-CM

## 2014-08-09 DIAGNOSIS — D599 Acquired hemolytic anemia, unspecified: Secondary | ICD-10-CM | POA: Diagnosis not present

## 2014-08-09 DIAGNOSIS — Z79899 Other long term (current) drug therapy: Secondary | ICD-10-CM

## 2014-08-09 DIAGNOSIS — D589 Hereditary hemolytic anemia, unspecified: Secondary | ICD-10-CM | POA: Diagnosis present

## 2014-08-09 DIAGNOSIS — Z952 Presence of prosthetic heart valve: Secondary | ICD-10-CM

## 2014-08-09 HISTORY — DX: Presence of other heart-valve replacement: Z95.4

## 2014-08-09 LAB — PULMONARY FUNCTION TEST
DL/VA % pred: 69 %
DL/VA: 3.13 ml/min/mmHg/L
DLCO cor % pred: 60 %
DLCO cor: 17.93 ml/min/mmHg
DLCO unc % pred: 47 %
DLCO unc: 14.1 ml/min/mmHg
FEF 25-75 Post: 1.51 L/sec
FEF 25-75 Pre: 1.5 L/sec
FEF2575-%Change-Post: 0 %
FEF2575-%Pred-Post: 51 %
FEF2575-%Pred-Pre: 50 %
FEV1-%Change-Post: 2 %
FEV1-%Pred-Post: 91 %
FEV1-%Pred-Pre: 89 %
FEV1-Post: 2.76 L
FEV1-Pre: 2.68 L
FEV1FVC-%Change-Post: 7 %
FEV1FVC-%Pred-Pre: 85 %
FEV6-%Change-Post: -4 %
FEV6-%Pred-Post: 101 %
FEV6-%Pred-Pre: 106 %
FEV6-Post: 3.75 L
FEV6-Pre: 3.95 L
FEV6FVC-%Change-Post: 0 %
FEV6FVC-%Pred-Post: 101 %
FEV6FVC-%Pred-Pre: 102 %
FVC-%Change-Post: -4 %
FVC-%Pred-Post: 99 %
FVC-%Pred-Pre: 103 %
FVC-Post: 3.79 L
FVC-Pre: 3.97 L
Post FEV1/FVC ratio: 73 %
Post FEV6/FVC ratio: 99 %
Pre FEV1/FVC ratio: 68 %
Pre FEV6/FVC Ratio: 99 %
RV % pred: 77 %
RV: 1.57 L
TLC % pred: 90 %
TLC: 5.94 L

## 2014-08-09 LAB — PREPARE RBC (CROSSMATCH)

## 2014-08-09 LAB — MRSA PCR SCREENING: MRSA by PCR: NEGATIVE

## 2014-08-09 MED ORDER — ALBUTEROL SULFATE (2.5 MG/3ML) 0.083% IN NEBU: 2.5000 mg | INHALATION_SOLUTION | Freq: Once | RESPIRATORY_TRACT | Status: DC

## 2014-08-09 MED ORDER — ONDANSETRON HCL 4 MG/2ML IJ SOLN: 4.0000 mg | Freq: Four times a day (QID) | INTRAMUSCULAR | Status: DC | PRN

## 2014-08-09 MED ORDER — SODIUM CHLORIDE 0.9 % IJ SOLN: 10.0000 mL | INTRAMUSCULAR | Status: DC | PRN

## 2014-08-09 MED ORDER — SODIUM CHLORIDE 0.9 % IJ SOLN
3.0000 mL | INTRAMUSCULAR | Status: DC | PRN
  Administered 2014-08-09: 3 mL via INTRAVENOUS
  Filled 2014-08-09: qty 3

## 2014-08-09 MED ORDER — HEPARIN SOD (PORK) LOCK FLUSH 100 UNIT/ML IV SOLN
500.0000 [IU] | Freq: Every day | INTRAVENOUS | Status: DC | PRN
  Filled 2014-08-09: qty 5

## 2014-08-09 MED ORDER — NITROGLYCERIN 0.4 MG SL SUBL: 0.4000 mg | SUBLINGUAL_TABLET | SUBLINGUAL | Status: DC | PRN

## 2014-08-09 MED ORDER — SODIUM CHLORIDE 0.9 % IV SOLN
Freq: Once | INTRAVENOUS | Status: AC
  Administered 2014-08-09: 18:00:00 via INTRAVENOUS

## 2014-08-09 MED ORDER — SODIUM CHLORIDE 0.9 % IV SOLN: 250.0000 mL | INTRAVENOUS | Status: DC | PRN

## 2014-08-09 MED ORDER — SODIUM CHLORIDE 0.9 % IV SOLN: INTRAVENOUS | Status: DC

## 2014-08-09 MED ORDER — POTASSIUM CHLORIDE CRYS ER 20 MEQ PO TBCR
40.0000 meq | EXTENDED_RELEASE_TABLET | Freq: Two times a day (BID) | ORAL | Status: DC
  Administered 2014-08-09 – 2014-08-10 (×2): 40 meq via ORAL
  Filled 2014-08-09 (×4): qty 2

## 2014-08-09 MED ORDER — SODIUM CHLORIDE 0.9 % IJ SOLN
3.0000 mL | Freq: Two times a day (BID) | INTRAMUSCULAR | Status: DC
  Administered 2014-08-09 (×2): 3 mL via INTRAVENOUS

## 2014-08-09 MED ORDER — LORATADINE 10 MG PO TABS
10.0000 mg | ORAL_TABLET | Freq: Every day | ORAL | Status: DC
  Administered 2014-08-10: 10 mg via ORAL
  Filled 2014-08-09 (×2): qty 1

## 2014-08-09 MED ORDER — FOLIC ACID 1 MG PO TABS
1.0000 mg | ORAL_TABLET | Freq: Every day | ORAL | Status: DC
  Administered 2014-08-10: 1 mg via ORAL
  Filled 2014-08-09 (×2): qty 1

## 2014-08-09 MED ORDER — ASPIRIN 81 MG PO CHEW: 81.0000 mg | CHEWABLE_TABLET | ORAL | Status: DC

## 2014-08-09 MED ORDER — ACETAMINOPHEN 325 MG PO TABS: 650.0000 mg | ORAL_TABLET | ORAL | Status: DC | PRN

## 2014-08-09 NOTE — H&P (Signed)
CARDIOLOGY HISTORY AND PHYSICAL    Patient ID: David [name] MRN: [id number], DOB/AGE: 05/04/1960 65 y.o.  Admit date: 08/09/2014   CC: shortness of breath and mitral valve disease  Primary Physician: Unice Cobble, MD Primary Cardiologist: Stanford Breed  HPI:  David [name] is a 54 y.o. male with a past medical history significant for mitral valve disease (s/p mitral valve repair 2009), prostate cancer, DDD, and anemia.  He was recently found to have significant mitral regurgitation associated with increased shortness of breath and profound hemolytic anemia.  He has been evaluated by Dr Roxy Manns who has recommended mitral valve repair/replacement.  He is being admitted for blood transfusion, catheterization, and mitral valve repair or replacement this admission.   He reports that he has had increased shortness of breath and tachycardia recently.  He has had orthopnea but no PND.  He has also had some hematuria.  He denies chest pain, LE edema, fevers, chills, nausea or vomiting.   Past Medical History  Diagnosis Date  . GERD (gastroesophageal reflux disease)   . Endocarditis 10-1999     RECURRENCE 02-2008  . History of chest tube placement 1980    R SIDE FOR SPONTANEOUS  PNEUMOTHORAX  . Periodontitis     PMH  . C. difficile colitis 2009  . External hemorrhoids without mention of complication 27/25/3664    Colonoscopy  . History of blood transfusion 02/2008, 04/2014.     for anemia.   . Prostate cancer     "5 of the samples from the biopsy was positive; Greeson # is 6"  . DDD (degenerative disc disease), lumbar   . Anemia 2009, 04/2014.   . Mitral regurgitation 07/19/2014    Recurrent severe mitral regurgitation s/p mitral valve repair 2009  . Hemolytic anemia 06/29/2014     Surgical History:  Past Surgical History  Procedure Laterality Date  . Colonoscopy  2005, 03/2012    external rrhoids 2005, normal exam 2013; [facility]  . Multiple tooth extractions  2009    "to prep for  mitral valve repair"  . Mitral valve repair  2009    via a mini thoracotomy  . Prostate biopsy  03/2014  . Cardiac catheterization  2009  . Esophagogastroduodenoscopy N/A 06/02/2014    Procedure: ESOPHAGOGASTRODUODENOSCOPY (EGD);  Surgeon: Jerene Bears, MD;  Location: [facility] ENDOSCOPY;  Service: Endoscopy;  Laterality: N/A;  . Tee without cardioversion N/A 07/19/2014    Procedure: TRANSESOPHAGEAL ECHOCARDIOGRAM (TEE);  Surgeon: Lelon Perla, MD;  Location: [facility] ENDOSCOPY;  Service: Cardiovascular;  Laterality: N/A;     Prescriptions prior to admission  Medication Sig Dispense Refill Last Dose  . folic acid (FOLVITE) 1 MG tablet Take 1 tablet (1 mg total) by mouth daily. 30 tablet 3 Taking  . loratadine (CLARITIN) 10 MG tablet Take 1 tablet (10 mg total) by mouth daily. 30 tablet 3 Taking  . potassium chloride SA (K-DUR,KLOR-CON) 20 MEQ tablet Take 2 tablets (40 mEq total) by mouth 2 (two) times daily. (Patient not taking: Reported on 08/03/2014) 30 tablet 2 Not Taking at Unknown time    Inpatient Medications:  . [START ON 08/10/2014] aspirin  81 mg Oral Pre-Cath  . sodium chloride  3 mL Intravenous Q12H    Allergies:  Allergies  Allergen Reactions  . Clarithromycin     REACTION: NAUSEA    History   Social History  . Marital Status: Married    Spouse Name: N/A  . Number of Children: N/A  . Years  of Education: N/A   Occupational History  . 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 newspaper delivery     delivers papers to distributors, part time on weekends.    Social History Main Topics  . Smoking status: Former Smoker -- 2.00 packs/day for 15 years    Types: Cigarettes    Quit date: 08/08/1989  . Smokeless tobacco: Never Used  . Alcohol Use: No     Comment: "stopped drinking in 1991; never treated for abuse"  . Drug Use: No     Comment: "stopped using drugs in ~ 1984"  . Sexual Activity: No   Other Topics Concern  . Not on file   Social History  Narrative   REGULAR EXERCISE 2X [employer] TREADMILL,WEIGHTS     Family History  Problem Relation Age of Onset  . Ovarian cancer Sister   . Diabetes Neg Hx   . Heart disease Neg Hx   . Stroke Neg Hx   . Alzheimer's disease Father      Review of Systems: All other systems reviewed and are otherwise negative except as noted above.  Physical Exam: Filed Vitals:   08/09/14 1214  BP: 103/75  Pulse: 109  Temp: 98.3 F (36.8 C)  TempSrc: Oral  Resp: 20  Weight: 154 lb 1.6 oz (69.9 kg)  SpO2: 100%    GEN- The patient is thin, well appearing, alert and oriented x 3 today.   HEENT: normocephalic, atraumatic; sclera clear, conjunctiva pink; hearing intact; oropharynx clear; neck supple Lungs- Clear to ausculation bilaterally, normal work of breathing.  No wheezes, rales, rhonchi Heart- Tachycardic, regular rate and rhythm, 3/6 murmur GI- soft, non-tender, non-distended, bowel sounds present  Extremities- no clubbing, cyanosis, or edema; DP/PT/radial pulses 2+ bilaterally MS- no significant deformity or atrophy Skin- warm and dry, no rash or lesion Psych- euthymic mood, full affect Neuro- strength and sensation are intact  Labs:   Lab Results  Component Value Date   WBC 5.0 08/01/2014   HGB 8.8* 08/01/2014   HCT 28.2* 08/01/2014   MCV 81.0 08/01/2014   PLT 209 08/01/2014   No results for input(s): NA, K, CL, CO2, BUN, CREATININE, CALCIUM, PROT, BILITOT, ALKPHOS, ALT, AST, GLUCOSE in the last 168 hours.  Invalid input(s): LABALBU    Radiology/Studies: No results found.  TELEMETRY: sinus tach  Assessment/Plan: 1.  Severe mitral regurgitation Patient presented approximately 3 months ago with severe fatigue and has been diagnosed with hemolytic anemia. Transesophageal echocardiogram performed recently reveals normal LV function, previous mitral valve repair with severe mitral regurgitation possibly through perforated posterior mitral valve leaflet. His mitral regurgitation is  clearly significant and likely the cause of his severe hemolytic anemia as well.Dr Roxy Manns evaluated patient and plans mitral valve repair/replacement this admission. Cardiac catheterization planned for tomorrow. The risks and benefits have been discussed and he agrees to proceed.    2.  Anemia Plan transfuse 2 units of PRBC now and reassess CBC tonight Likely due to MV disease Will check CBC again tomorrow afternoon and transfuse if necessary pre-op  Signed, Chanetta Marshall, NP 08/09/2014 1:23 PM  As above, patient seen and examined. Briefly he is a 54 year old male status post mitral valve repair, prostate cancer and recently diagnosed hemolytic anemia admitted for cardiac catheterization and mitral valve replacement. Patient has had previous MV repair. Recent transesophageal echocardiogram demonstrated normal LV function, previous mitral valve repair, severe mitral regurgitation possibly through perforated posterior mitral valve leaflet. This is felt to be  the cause of his hemolytic anemia. He has been transfusion dependent. He states he is having increasing dyspnea on exertion, fatigue and tachycardia. Symptoms are similar to those when his hemoglobin was low previously. We will transfuse 2 units of pack red blood cells tonight. Check hemoglobin following transfusion. Proceed with cardiac catheterization tomorrow. The risks and benefits were discussed and he agrees to proceed. Recheck hemoglobin tomorrow afternoon following catheterization. If less than 10 would transfuse further in anticipation of mitral valve replacement on Thursday. Note blood cultures have been negative. Patient also discussed with Dr Burr Medico; based on extensive hematology evaluation, there is no other etiology felt to be causing his hemolytic anemia other than MV disease. Kirk Ruths

## 2014-08-09 NOTE — Progress Notes (Signed)
Utilization review completed.  

## 2014-08-09 NOTE — Interval H&P Note (Signed)
Cath Lab Visit (complete for each Cath Lab visit)  Clinical Evaluation Leading to the Procedure:   ACS: No.  Non-ACS:    Anginal Classification: No Symptoms  Anti-ischemic medical therapy: No Therapy  Non-Invasive Test Results: No non-invasive testing performed  Prior CABG: No previous CABG      History and Physical Interval Note:  08/09/2014 1:08 PM Right and left heart ath in preparation for MVR. David [name]  has presented today for surgery, with the diagnosis of cp  The various methods of treatment have been discussed with the patient and family. After consideration of risks, benefits and other options for treatment, the patient has consented to  Procedure(s): LEFT AND RIGHT HEART CATHETERIZATION WITH CORONARY ANGIOGRAM (N/A) as a surgical intervention .  The patient's history has been reviewed, patient examined, no change in status, stable for surgery.  I have reviewed the patient's chart and labs.  Questions were answered to the patient's satisfaction.     [name]

## 2014-08-09 NOTE — H&P (View-Only) (Signed)
Patient has been seen by Dr. Roxy Manns. He agrees patient will require mitral valve replacement. He continues to have hemolytic anemia requiring transfusion (felt secondary to mitral regurgitation from previous mitral valve repair; probable perforation of posterior MV leaflet). Our plan will be to admit to telemetry bed on April 12. We will transfuse 2 units of packed red blood cells. He will proceed with right and left cardiac catheterization on April 13. We will also repeat his hemoglobin at that time. Transfuse further as needed. He will undergo mitral valve replacement on April 14. Arrangements are being made for the above plan.  David [name]

## 2014-08-09 NOTE — Consult Note (Signed)
[address]       [place] [address]             [phone]          CARDIOTHORACIC SURGERY CONSULTATION REPORT  Referring Provider is Stanford Breed, Denice Bors, MD PCP is Unice Cobble, MD  Chief Complaint  Patient presents with  . Mitral Regurgitation    HPI:  Patient is a 54 year old African-American male from [place] with history of mitral valve endocarditis in 2001 and recurrent endocarditis in 3716 complicated by congestive heart failure. He underwent mitral valve repair 03/22/2008 via right mini thoracotomy approach. Findings at the time of surgery were notable for the presence of a large perforation in the anterior leaflet with active vegetations and a flail segment of the posterior leaflet (P3) with multiple ruptured primary chordae tendineae. The patient recovered uneventfully and was discharged from the hospital on the fourth postoperative day. His subsequent convalescence was uneventful and follow-up echocardiogram performed 04/01/2008 revealed intact mitral valve repair with no residual mitral regurgitation. The patient completely recovered and returned to normal physical activity without any limitations. He was last seen in follow-up in our office on 11/23/2008. The patient did very well until approximately 6 or 7 months ago when he first began to develop mild symptoms of fatigue. Symptoms gradually progressed and the patient began to experience some exertional shortness of breath and occasional pressure across his chest. He was seen by his primary care physician (Dr. [name]) in early February, and routine blood work revealed a hemoglobin of 5.9. He was admitted to the hospital 05/31/2014 and transfused 2 units packed red blood cells with increase in hemoglobin to 9.0. The patient was treated with IV proton X and gastroenterology was consulted. The patient's stool was Hemoccult negative. EGD was performed and revealed no sign of GI bleeding.  Patient was noted to have had colonoscopy in 2013. The patient was discharged from the hospital 06/02/2014 and followed closely by Dr. [name]. He reported intermittent night sweats with recurrent exertional shortness breath and chest pressure. Follow-up blood work obtained 06/20/2014 revealed the patient's hemoglobin had dropped to again to 7.2. He was readmitted to the hospital 06/21/2014 and transfused another 2 units of PRBCs. He was seen in consultation by Dr. Burr Medico from hematology. His hemoglobin increased appropriately with transfusion and the patient was discharged home. Subsequent lab workup confirmed the patient had findings consistent with severe hemolysis. He was treated with a short course of high-dose prednisone for presumed autoimmune hemolytic anemia, but this has subsequently been stopped. Cardiology consultation was requested and the patient was seen in the office by Dr. Acie Fredrickson. He was noted have a systolic murmur on exam. Transesophageal echocardiogram was performed 07/19/2014 by Dr. Stanford Breed. This revealed the presence of severe mitral regurgitation. The patient was for surgical consultation and seen in consultation on 08/03/2014.  Plans were made for the patient to be admitted to the hospital for transfusion prior to left and right heart catheterization with tentative plans for redo mitral valve repair or replacement on Thursday 08/11/2014.  The patient is married and lives locally in [place] with his wife and 2 of his adult children. Until recently has lived an active lifestyle. He describes a several month history of progressive fatigue with more recent development of exertional shortness of breath. He denies any history of resting shortness of breath, PND, orthopnea, or lower extremity edema. He has not had dizzy spells or palpitations. He denies any fevers or chills. He  has had some night sweats. Appetite is normal. He states that he lost some weight but has been gaining it back.  He denies any arthritis or arthralgias.         Past Medical History  Diagnosis Date  . GERD (gastroesophageal reflux disease)   . Endocarditis 10-1999     RECURRENCE 02-2008  . History of chest tube placement 1980    R SIDE FOR SPONTANEOUS  PNEUMOTHORAX  . Periodontitis     PMH  . C. difficile colitis 2009  . External hemorrhoids without mention of complication 34/28/7681    Colonoscopy  . History of blood transfusion 02/2008, 04/2014.     for anemia.   . Prostate cancer     "5 of the samples from the biopsy was positive; Greeson # is 6"  . DDD (degenerative disc disease), lumbar   . Anemia 2009, 04/2014.   . Mitral regurgitation 07/19/2014    Recurrent severe mitral regurgitation s/p mitral valve repair 2009  . Hemolytic anemia 06/29/2014    Past Surgical History  Procedure Laterality Date  . Colonoscopy  2005, 03/2012    external rrhoids 2005, normal exam 2013; [facility]  . Multiple tooth extractions  2009    "to prep for mitral valve repair"  . Mitral valve repair  2009    via a mini thoracotomy  . Prostate biopsy  03/2014  . Cardiac catheterization  2009  . Esophagogastroduodenoscopy N/A 06/02/2014    Procedure: ESOPHAGOGASTRODUODENOSCOPY (EGD);  Surgeon: Jerene Bears, MD;  Location[facility] ENDOSCOPY;  Service: Endoscopy;  Laterality: N/A;  . Tee without cardioversion N/A 07/19/2014    Procedure: TRANSESOPHAGEAL ECHOCARDIOGRAM (TEE);  Surgeon: Lelon Perla, MD;  Location: [facility] ENDOSCOPY;  Service: Cardiovascular;  Laterality: N/A;    Family History  Problem Relation Age of Onset  . Ovarian cancer Sister   . Diabetes Neg Hx   . Heart disease Neg Hx   . Stroke Neg Hx   . Alzheimer's disease Father     History   Social History  . Marital Status: Married    Spouse Name: N/A  . Number of Children: N/A  . Years of Education: N/A   Occupational History  . 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 newspaper delivery     delivers papers to  distributors, part time on weekends.    Social History Main Topics  . Smoking status: Former Smoker -- 2.00 packs/day for 15 years    Types: Cigarettes    Quit date: 08/08/1989  . Smokeless tobacco: Never Used  . Alcohol Use: No     Comment: "stopped drinking in 1991; never treated for abuse"  . Drug Use: No     Comment: "stopped using drugs in ~ 1984"  . Sexual Activity: No   Other Topics Concern  . Not on file   Social History Narrative   REGULAR EXERCISE 2X [employer]READMILL,WEIGHTS    Prior to Admission medications   Medication Sig Start Date End Date Taking? Authorizing Provider  folic acid (FOLVITE) 1 MG tablet Take 1 tablet (1 mg total) by mouth daily. 07/08/14  Yes Truitt Merle, MD  loratadine (CLARITIN) 10 MG tablet Take 1 tablet (10 mg total) by mouth daily. 03/22/14  Yes Kaitlyn Szekalski, PA-C  potassium chloride SA (K-DUR,KLOR-CON) 20 MEQ tablet Take 2 tablets (40 mEq total) by mouth 2 (two) times daily. Patient not taking: Reported on 08/03/2014 07/01/14   Truitt Merle, MD  Current Facility-Administered Medications  Medication Dose Route Frequency Provider Last Rate Last Dose  . 0.9 %  sodium chloride infusion  250 mL Intravenous PRN Belva Crome, MD      . 0.9 %  sodium chloride infusion   Intravenous Once Chanetta Marshall, NP      . Derrill Memo ON 08/10/2014] 0.9 %  sodium chloride infusion   Intravenous Continuous Chanetta Marshall, NP      . acetaminophen (TYLENOL) tablet 650 mg  650 mg Oral Q4H PRN Chanetta Marshall, NP      . albuterol (PROVENTIL) (2.5 MG/3ML) 0.083% nebulizer solution 2.5 mg  2.5 mg Nebulization Once Rexene Alberts, MD      . nitroGLYCERIN (NITROSTAT) SL tablet 0.4 mg  0.4 mg Sublingual Q5 Min x 3 PRN Chanetta Marshall, NP      . ondansetron (ZOFRAN) injection 4 mg  4 mg Intravenous Q6H PRN Chanetta Marshall, NP      . sodium chloride 0.9 % injection 10 mL  10 mL Intracatheter PRN Truitt Merle, MD      . sodium chloride 0.9 % injection 3 mL  3 mL Intravenous Q12H Belva Crome, MD      .  sodium chloride 0.9 % injection 3 mL  3 mL Intravenous PRN Belva Crome, MD        Allergies  Allergen Reactions  . Clarithromycin     REACTION: NAUSEA      Review of Systems:  General:normal appetite, decreased energy, no weight gain, + weight loss, no fever Cardiac:no chest pain with exertion, occasional mild chest pain at rest, + SOB with exertion, no resting SOB, no PND, no orthopnea, no palpitations, no arrhythmia, no atrial fibrillation, no LE edema, no dizzy spells, no syncope Respiratory:no shortness of breath, no home oxygen, no productive cough, no dry cough, no bronchitis, no wheezing, no hemoptysis, no asthma, no pain with inspiration or cough, no sleep apnea, no CPAP at night GI:no difficulty swallowing, no reflux, no frequent heartburn, no hiatal hernia, no abdominal pain, no constipation, no diarrhea, no hematochezia, no hematemesis, no melena GU:no dysuria, no frequency, no urinary tract infection, no hematuria, no enlarged prostate, no kidney stones, no kidney disease Vascular:no pain suggestive of claudication, no pain in feet, no leg cramps, no varicose veins, no DVT, no non-healing foot ulcer Neuro:no stroke, no TIA's, no seizures, no headaches, no temporary blindness one eye, no slurred speech, no peripheral neuropathy, no chronic pain, no instability of gait, no memory/cognitive dysfunction Musculoskeletal:no arthritis, no joint swelling, no myalgias, no difficulty walking, normal mobility  Skin:no rash, no itching, no skin infections, no pressure sores or ulcerations Psych:no anxiety, no depression, no nervousness, no  unusual recent stress Eyes:no blurry vision, no floaters, no recent vision changes, + wears glasses or contacts ENT:no hearing loss, no loose or painful teeth, no dentures, last saw dentist within the past 6 months Hematologic:no easy bruising, no abnormal bleeding, no clotting disorder, no frequent epistaxis Endocrine:no diabetes, does not check CBG's at home   Physical Exam:  BP 140/84 mmHg  Pulse 111  Resp 20  Ht 5\' 10"  (1.778 m)  Wt 157 lb (71.215 kg)  BMI 22.53 kg/m2  SpO2 98% General:Thin, well-appearing HEENT:Unremarkable  Neck:no JVD, no bruits, no adenopathy  Chest:clear to auscultation, symmetrical breath sounds, no wheezes, no rhonchi  CV:RRR, grade III/VI somewhat high pitched holosystolic murmur  Abdomen:soft, non-tender, no masses  Extremities:warm, well-perfused, pulses palpable, no LE edema Rectal/GUDeferred Neuro:Grossly non-focal and symmetrical throughout  Skin:Clean and dry, no rashes, no breakdown, no peripheral signs of endocarditis   Diagnostic Tests:  Hemolysis Labs:  Results for TRITON, HEIDRICH (MRN [id number]) as of 08/03/2014 18:53  Ref. Range 06/22/2014 01:28  Haptoglobin Latest Range: 43-212 mg/dL <10 (L)   Results for DEREC, MOZINGO (MRN [id number]) as of 08/03/2014 18:53  Ref. Range 06/23/2014 05:02  Hgb, Plasma Latest Range: 0.0-8.3 mg/dL 62.9 (H)   Results for BURAK, ZERBE (MRN [id number]) as of  08/03/2014 18:53  Ref. Range 07/08/2014 10:24  Immature Retic Fract Latest Range: 3.00-10.60 % 43.40 (H)     Transthoracic Echocardiography  Patient:  Jaymison, Luber MR #:    [id number] Study Date: 06/22/2014 Gender:   M Age:    12 Height:   177.8 cm Weight:   69.4 kg BSA:    1.85 m^2 Pt. Status: Room:    [place], [place], [place] [id number] SONOGRAPHER Johny Chess, RDCS, CCT PERFORMING  [facility], Inpatient  cc:  ------------------------------------------------------------------- LV EF: 60% -  65%  ------------------------------------------------------------------- History:  PMH: Mitral valve repair. Risk factors: Prostate cancer. GERD. Dyslipidemia.  ------------------------------------------------------------------- Study Conclusions  - Left ventricle: The cavity size was normal. Wall thickness was normal. Systolic function was normal. The estimated ejection fraction was in the range of 60% to 65%. Wall motion was normal; there were no regional wall motion abnormalities. - Mitral valve: Prior procedures included surgical repair. There was severe regurgitation. Valve area by continuity equation (using LVOT flow): 1.02 cm^2. - Left atrium: The atrium was moderately dilated. - Right atrium: The atrium was mildly dilated. - Pulmonic valve: There was moderate regurgitation. - Pulmonary arteries: Systolic pressure was mildly increased. PA peak pressure: 39 mm Hg (S).  Transthoracic echocardiography. M-mode, complete 2D, spectral Doppler, and color Doppler. Birthdate: Patient birthdate: 03/04/1961. Age: Patient is 54 yr old. Sex: Gender: male. BMI: 22 kg/m^2. Blood pressure:   105/71 Patient status: Inpatient. Study date: Study date: 06/22/2014. Study time: 10:08 AM. Location:  Bedside.  -------------------------------------------------------------------  ------------------------------------------------------------------- Left ventricle: The cavity size was normal. Wall thickness was normal. Systolic function was normal. The estimated ejection fraction was in the range of 60% to 65%. Wall motion was normal; there were no regional wall motion abnormalities.  ------------------------------------------------------------------- Aortic valve:  Structurally normal valve.  Cusp separation was normal. Doppler: Transvalvular velocity was within the normal range. There was no stenosis. There was no regurgitation.  ------------------------------------------------------------------- Aorta: Aortic root: The aortic root was normal in size. Ascending aorta: The ascending aorta was normal in size.  ------------------------------------------------------------------- Mitral valve: There is severe MR. There is a measured mitral valve gradient that is likely due to the severe MR. I do not think that he has significant mitral stenosis. Suggest TEE for further evaluation of the mitral valve if clinically indicated. Moderately thickened leaflets . Prior procedures included surgical repair. Doppler: There was severe regurgitation.  Valve area by pressure half-time: 2.97 cm^2. Indexed valve area by pressure half-time: 1.61 cm^2/m^2. Valve area by continuity equation (using LVOT flow): 1.02 cm^2. Indexed valve area by continuity equation (using LVOT flow): 0.55 cm^2/m^2.  Mean gradient (D): 17 mm Hg. Peak gradient (D): 17 mm Hg.  ------------------------------------------------------------------- Left atrium: The atrium was moderately dilated.  ------------------------------------------------------------------- Right ventricle: The cavity size was normal. Systolic function  was normal.  ------------------------------------------------------------------- Pulmonic valve:  The valve appears to be grossly normal. Doppler: There was moderate regurgitation.  ------------------------------------------------------------------- Tricuspid valve:  Structurally normal valve.  Leaflet separation was normal. Doppler: Transvalvular velocity was within the normal range. There was mild regurgitation.  ------------------------------------------------------------------- Pulmonary artery:  Systolic pressure was mildly increased.  ------------------------------------------------------------------- Right atrium: There is a prominent Chiari network visible in the RA The atrium was mildly dilated. There was the appearance of a Chiari network.  ------------------------------------------------------------------- Pericardium: There was no pericardial effusion.  ------------------------------------------------------------------- Systemic veins: Inferior vena cava: The vessel was normal in size. The respirophasic diameter changes were in the normal range (= 50%), consistent with normal central venous pressure.  ------------------------------------------------------------------- Measurements  Left ventricle              Value     Reference LV ID, ED, PLAX chordal          47.8 mm    43 - 52 LV ID, ES, PLAX chordal          29.4 mm    23 - 38 LV fx shortening, PLAX chordal      38  %    >=29 LV PW thickness, ED            11.1 mm    --------- IVS/LV PW ratio, ED            0.87      <=1.3 Stroke volume, 2D             84  ml    --------- Stroke volume/bsa, 2D           45  ml/m^2  ---------  Ventricular septum            Value     Reference IVS thickness, ED             9.67 mm    ---------  LVOT                    Value     Reference LVOT ID, S                21  mm    --------- LVOT area                 3.46 cm^2   --------- LVOT peak velocity, S           137  cm/s   --------- LVOT mean velocity, S           84.7 cm/s   --------- LVOT VTI, S                24.2 cm    --------- LVOT peak gradient, S           8   mm Hg  ---------  Aorta                   Value     Reference Aortic root ID, ED            31  mm    ---------  Left atrium                Value     Reference LA ID, A-P, ES              51  mm    --------- LA ID/bsa, A-P          (H)   2.76 cm/m^2  <=2.2 LA volume, S               91  ml    ---------  LA volume/bsa, S             49.2 ml/m^2  --------- LA volume, ES, 1-p A4C          77  ml    --------- LA volume/bsa, ES, 1-p A4C        41.6 ml/m^2  --------- LA volume, ES, 1-p A2C          105  ml    --------- LA volume/bsa, ES, 1-p A2C        56.8 ml/m^2  ---------  Mitral valve               Value     Reference Mitral E-wave peak velocity        204  cm/s   --------- Mitral A-wave peak velocity        162  cm/s   --------- Mitral mean velocity, D          194  cm/s   --------- Mitral deceleration time     (H)   285  ms    150 - 230 Mitral pressure half-time         79  ms    --------- Mitral mean gradient, D          17  mm Hg  --------- Mitral peak gradient, D          17  mm Hg  --------- Mitral E/A ratio, peak          1.3      --------- Mitral valve area, PHT, DP        2.97 cm^2   --------- Mitral valve  area/bsa, PHT, DP      1.61 cm^2/m^2 --------- Mitral valve area, LVOT          1.02 cm^2   --------- continuity Mitral valve area/bsa, LVOT        0.55 cm^2/m^2 --------- continuity Mitral annulus VTI, D           82  cm    --------- Mitral maximal regurg velocity,      463  cm/s   --------- PISA Mitral regurg VTI, PISA          116  cm    ---------  Pulmonary arteries            Value     Reference PA pressure, S, DP        (H)   39  mm Hg  <=30  Tricuspid valve              Value     Reference Tricuspid regurg peak velocity      302  cm/s   --------- Tricuspid peak RV-RA gradient       36  mm Hg  ---------  Systemic veins              Value     Reference Estimated CVP               3   mm Hg  ---------  Right ventricle              Value     Reference RV pressure, S, DP        (H)   39  mm Hg  <=30 RV s&', lateral, S             14.6 cm/s   ---------  Legend: (L) and (H) mark values outside specified reference range.  -------------------------------------------------------------------  Prepared and Electronically Authenticated by  Mertie Moores, M.D. 2016-02-24T12:19:00    Transesophageal Echocardiography  Patient:  Cristo, Ausburn MR #:    [id number] Study Date: 07/19/2014 Gender:   M Age:    61 Height:   152.4 cm Weight:   70 kg BSA:    1.75 m^2 Pt. Status: Room:  ADMITTING  Kirk Ruths ATTENDING  Kirk Ruths ORDERING   Kirk Ruths PERFORMING  Kirk Ruths [place] SONOGRAPHER Leavy Cella  cc:  ------------------------------------------------------------------- LV EF: 55% -   60%  ------------------------------------------------------------------- Indications:   424.0 Mitral valve disease.  ------------------------------------------------------------------- Study Conclusions  - Left ventricle: Systolic function was normal. The estimated ejection fraction was in the range of 55% to 60%. Wall motion was normal; there were no regional wall motion abnormalities. - Mitral valve: Prior procedures included surgical repair. There was severe regurgitation. - Left atrium: The atrium was mildly dilated. No evidence of thrombus in the atrial cavity or appendage. - Right atrium: No evidence of thrombus in the atrial cavity or appendage. - Atrial septum: No defect or patent foramen ovale was identified.  Impressions:  - Normal LV function; s/p MV repair; possible perforated posterior MV leaflet; severe MR directed posterolaterally with additional smaller second jet.  Diagnostic transesophageal echocardiography. 2D and color Doppler. Birthdate: Patient birthdate: [birth year]. Age: Patient is 54 yr old. Sex: Gender: male.  BMI: 30.1 kg/m^2. Blood pressure: 113/76 Patient status: Inpatient. Study date: Study date: 07/19/2014. Study time: 10:30 AM. Location: Endoscopy.  -------------------------------------------------------------------  ------------------------------------------------------------------- Left ventricle: Systolic function was normal. The estimated ejection fraction was in the range of 55% to 60%. Wall motion was normal; there were no regional wall motion abnormalities.  ------------------------------------------------------------------- Aortic valve:  Structurally normal valve. Trileaflet; normal thickness leaflets. Cusp separation was normal. Doppler: There was no significant regurgitation.  ------------------------------------------------------------------- Aorta: Descending aorta: The descending aorta had  mild diffuse disease.  ------------------------------------------------------------------- Mitral valve: Prior procedures included surgical repair. Leaflet separation was normal. Doppler: There was severe regurgitation. Mean gradient (D): 12 mm Hg.  ------------------------------------------------------------------- Left atrium: The atrium was mildly dilated. No evidence of thrombus in the atrial cavity or appendage.  ------------------------------------------------------------------- Atrial septum: No defect or patent foramen ovale was identified.  ------------------------------------------------------------------- Right ventricle: The cavity size was normal. Systolic function was normal.  ------------------------------------------------------------------- Pulmonic valve:  Structurally normal valve.  Doppler: There was mild regurgitation.  ------------------------------------------------------------------- Tricuspid valve:  Structurally normal valve.  Leaflet separation was normal. Doppler: There was trivial regurgitation.  ------------------------------------------------------------------- Right atrium: The atrium was normal in size. There was the appearance of a Chiari network. No evidence of thrombus in the atrial cavity or appendage.  ------------------------------------------------------------------- Pericardium: There was no pericardial effusion.  ------------------------------------------------------------------- Measurements  Mitral valve         Value Mitral mean velocity, D   163  cm/s Mitral mean gradient, D   12  mm Hg Mitral annulus VTI, D    57.9 cm  Legend: (L) and (H) mark values outside specified reference range.  ------------------------------------------------------------------- Prepared and Electronically Authenticated by  Kirk Ruths 2016-03-22T18:59:43        CT CHEST, ABDOMEN, AND  PELVIS WITH CONTRAST  TECHNIQUE: Multidetector CT imaging of the chest, abdomen and pelvis was performed following the standard protocol during bolus administration of intravenous contrast.  CONTRAST: 113mL OMNIPAQUE IOHEXOL 300 MG/ML SOLN  COMPARISON: None.  FINDINGS: CT CHEST FINDINGS  Thoracic inlet: No neck base or axillary masses. No adenopathy.  Mediastinum and hila: Changes from mitral valve  replacement. Mild left atrial enlargement. Overall heart size normal. No mediastinal or hilar masses or pathologically enlarged lymph nodes.  Lungs and pleura: 10 mm nodule in the peripheral right middle lobe just below the minor fissure. Tiny peripheral nodule in the left lower lobe, image 53, series 3. There is peripheral reticular opacity in the inferior right upper lobe, right middle lobe and both posterior lateral lower lobes. This is likely combination of mild interstitial scarring and subsegmental atelectasis. Minimal pleural effusions are noted. No pneumothorax.  CT ABDOMEN AND PELVIS FINDINGS  Normal liver, spleen and gallbladder. No bile duct dilation.  Low-attenuation areas into pancreatic head without a convincing discrete mass. This may be due to beam hardening artifact from adjacent dense colonic contrast. No other evidence of a pancreatic abnormality.  Normal adrenal glands. Normal kidneys and ureters. Bladder mildly thick-walled. No bladder mass or stone.  Trace free fluid noted in the posterior pelvis.  No pathologically enlarged intraperitoneal or retroperitoneal lymph nodes.  No bowel wall thickening or inflammatory changes. No bowel dilation. Normal appendix visualized.  MUSCULOSKELETAL  No osteoblastic or osteolytic lesions. Degenerative changes of the lumbar spine most evident at L5-S1  IMPRESSION: 1. No evidence of lymphadenopathy. 2. 10 mm nodule in the right middle lobe. Given the size of this nodule, biopsy or  follow-up PET-CT should be considered. If these not performed, short-term follow-up with repeat CT in 3 months is recommended. 3. Area of hypoattenuation in the pancreatic head that may be artifact from dense adjacent colonic contrast. A mass is not excluded, but felt less likely. This could also be the assessed on short-term followup CT or pancreatic MRI with and without contrast. 4. Mild wall thickening of the bladder. Consider cystitis if there are consistent symptoms. 5. Trace pelvic free fluid, nonspecific.   Electronically Signed  By: Lajean Manes M.D.  On: 06/22/2014 15:49    Impression:  Patient has stage D severe symptomatic recurrent mitral regurgitation associated with severe hemolysis and recent onset symptoms of exertional shortness of breath consistent with acute diastolic congestive heart failure. I have personally reviewed the patient's recent transthoracic and transesophageal echocardiogram. There is severe mitral regurgitation with what may be a perforation of a segment of the posterior leaflet. Left ventricular size and systolic function remai normal. There are no obvious vegetations or other signs of active bacterial endocarditis. The patient has not had any clear symptoms or other physical signs of endocarditis with exception of occasional symptoms of "night sweats". I agree the patient needs surgical intervention. The likelihood of successful and durable redo mitral valve repair is very unclear, and mitral valve replacement may be necessary.  Recent CT scan of the chest, abdomen, and pelvis has been reviewed. A small (10 mm) nodule was appreciated in the right middle lobe. This nodule was need to be followed but has benign characteristics. I do not favor proceeding with PET CT scan at this juncture due to the relatively small size of lesion. In addition, the "area of hypoattenuation" in the pancreatic head is most likely artifact. This can be followed up with repeat  imaging in the future.   Plan:  I have discussed the indications, risks, and potential benefits of redo mitral valve repair or replacement with the patient in the office today. The rationale for surgery has been explained, including a comparison between surgery and continued medical therapy with close follow-up. The likelihood of successful and durable valve repair has been discussed with particular reference to the findings of their recent  echocardiogram. In the event that their valve cannot be successfully repaired, we discussed the possibility of replacing the mitral valve using a mechanical prosthesis with the attendant need for long-term anticoagulation versus the alternative of replacing it using a bioprosthetic tissue valve with its potential for late structural valve deterioration and failure, depending upon the patient's longevity. The patient specifically requests that if the mitral valve must be replaced that it be done using a mechanical valve. The patient understands and accepts all potential risks of surgery including but not limited to risk of death, stroke or other neurologic complication, myocardial infarction, congestive heart failure, respiratory failure, renal failure, bleeding requiring transfusion and/or reexploration, arrhythmia, infection or other wound complications, pneumonia, pleural and/or pericardial effusion, pulmonary embolus, aortic dissection or other major vascular complication, or delayed complications related to valve repair or replacement including but not limited to structural valve deterioration and failure, thrombosis, embolization, endocarditis, or paravalvular leak. All of their questions have been answered. The patient will be admitted to the cardiology service for blood transfusion and diagnostic cardiac catheterization. The patient will need to have multiple sets of blood cultures obtained at the time of admission. We tend we plan to proceed with surgery  on Thursday, 08/11/2014.          Valentina Gu. Roxy Manns, MD 08/09/2014 4:34 PM

## 2014-08-09 NOTE — Significant Event (Signed)
First unit of pRBC given at 1824. VS stable. No complaints at this time from patient. Patient educated to let staff know of symptoms of reactions. Will continue to monitor. Imir Brumbach, Therapist, sports.

## 2014-08-09 NOTE — Significant Event (Signed)
Accepted patient into 2W10-direct admit from home. Patient alert/oriented, independent. Patient stated that he is here to have blood transfusion today, cardiac catherization tomorrow, and mitral valve surgery on Thursday. Patient oriented to room and unit. Patient denied pain or SOB. Page cardiology for orders. Will continue to monitor.

## 2014-08-10 ENCOUNTER — Inpatient Hospital Stay (HOSPITAL_COMMUNITY): Payer: 59

## 2014-08-10 ENCOUNTER — Encounter (HOSPITAL_COMMUNITY): Payer: Self-pay | Admitting: Interventional Cardiology

## 2014-08-10 ENCOUNTER — Encounter (HOSPITAL_COMMUNITY)
Admission: RE | Disposition: A | Discharge status: Home / Self Care | Payer: Self-pay | Source: Ambulatory Visit | Attending: Thoracic Surgery (Cardiothoracic Vascular Surgery)

## 2014-08-10 ENCOUNTER — Ambulatory Visit (HOSPITAL_COMMUNITY): Admission: RE | Admit: 2014-08-10 | Payer: 59 | Source: Ambulatory Visit | Admitting: Interventional Cardiology

## 2014-08-10 DIAGNOSIS — Z9889 Other specified postprocedural states: Secondary | ICD-10-CM

## 2014-08-10 DIAGNOSIS — C61 Malignant neoplasm of prostate: Secondary | ICD-10-CM

## 2014-08-10 DIAGNOSIS — I059 Rheumatic mitral valve disease, unspecified: Secondary | ICD-10-CM

## 2014-08-10 DIAGNOSIS — I340 Nonrheumatic mitral (valve) insufficiency: Secondary | ICD-10-CM

## 2014-08-10 DIAGNOSIS — I2510 Atherosclerotic heart disease of native coronary artery without angina pectoris: Secondary | ICD-10-CM

## 2014-08-10 DIAGNOSIS — D599 Acquired hemolytic anemia, unspecified: Secondary | ICD-10-CM

## 2014-08-10 HISTORY — PX: LEFT AND RIGHT HEART CATHETERIZATION WITH CORONARY ANGIOGRAM: SHX5449

## 2014-08-10 LAB — URINALYSIS, ROUTINE W REFLEX MICROSCOPIC
Bilirubin Urine: NEGATIVE
Glucose, UA: NEGATIVE mg/dL
Ketones, ur: NEGATIVE mg/dL
Leukocytes, UA: NEGATIVE
Nitrite: NEGATIVE
Protein, ur: NEGATIVE mg/dL
Specific Gravity, Urine: 1.017 (ref 1.005–1.030)
Urobilinogen, UA: 0.2 mg/dL (ref 0.0–1.0)
pH: 6 (ref 5.0–8.0)

## 2014-08-10 LAB — BASIC METABOLIC PANEL
Anion gap: 10 (ref 5–15)
BUN: 11 mg/dL (ref 6–23)
CO2: 19 mmol/L (ref 19–32)
Calcium: 9.1 mg/dL (ref 8.4–10.5)
Chloride: 110 mmol/L (ref 96–112)
Creatinine, Ser: 1.1 mg/dL (ref 0.50–1.35)
GFR calc Af Amer: 86 mL/min — ABNORMAL LOW (ref >90)
GFR calc non Af Amer: 74 mL/min — ABNORMAL LOW (ref >90)
Glucose, Bld: 87 mg/dL (ref 70–99)
Potassium: 4.4 mmol/L (ref 3.5–5.1)
Sodium: 139 mmol/L (ref 135–145)

## 2014-08-10 LAB — CBC WITH DIFFERENTIAL/PLATELET
Basophils Absolute: 0.1 10*3/uL (ref 0.0–0.1)
Basophils Relative: 1 % (ref 0–1)
Eosinophils Absolute: 0.1 10*3/uL (ref 0.0–0.7)
Eosinophils Relative: 1 % (ref 0–5)
HCT: 32.8 % — ABNORMAL LOW (ref 39.0–52.0)
Hemoglobin: 10.4 g/dL — ABNORMAL LOW (ref 13.0–17.0)
Lymphocytes Relative: 27 % (ref 12–46)
Lymphs Abs: 1.5 10*3/uL (ref 0.7–4.0)
MCH: 25.8 pg — ABNORMAL LOW (ref 26.0–34.0)
MCHC: 31.7 g/dL (ref 30.0–36.0)
MCV: 81.4 fL (ref 78.0–100.0)
Monocytes Absolute: 0.4 10*3/uL (ref 0.1–1.0)
Monocytes Relative: 8 % (ref 3–12)
Neutro Abs: 3.4 10*3/uL (ref 1.7–7.7)
Neutrophils Relative %: 63 % (ref 43–77)
Platelets: 194 10*3/uL (ref 150–400)
RBC: 4.03 MIL/uL — ABNORMAL LOW (ref 4.22–5.81)
RDW: 30.7 % — ABNORMAL HIGH (ref 11.5–15.5)
WBC: 5.5 10*3/uL (ref 4.0–10.5)

## 2014-08-10 LAB — CBC
HCT: 32.9 % — ABNORMAL LOW (ref 39.0–52.0)
HCT: 33.6 % — ABNORMAL LOW (ref 39.0–52.0)
Hemoglobin: 10.5 g/dL — ABNORMAL LOW (ref 13.0–17.0)
Hemoglobin: 10.6 g/dL — ABNORMAL LOW (ref 13.0–17.0)
MCH: 26.3 pg (ref 26.0–34.0)
MCH: 26.1 pg (ref 26.0–34.0)
MCHC: 31.9 g/dL (ref 30.0–36.0)
MCHC: 31.5 g/dL (ref 30.0–36.0)
MCV: 82.5 fL (ref 78.0–100.0)
MCV: 82.8 fL (ref 78.0–100.0)
Platelets: 193 10*3/uL (ref 150–400)
Platelets: 152 10*3/uL (ref 150–400)
RBC: 3.99 MIL/uL — ABNORMAL LOW (ref 4.22–5.81)
RBC: 4.06 MIL/uL — ABNORMAL LOW (ref 4.22–5.81)
RDW: 30.7 % — ABNORMAL HIGH (ref 11.5–15.5)
RDW: 31.2 % — ABNORMAL HIGH (ref 11.5–15.5)
WBC: 6.4 10*3/uL (ref 4.0–10.5)
WBC: 6.3 10*3/uL (ref 4.0–10.5)

## 2014-08-10 LAB — URINE MICROSCOPIC-ADD ON

## 2014-08-10 LAB — POCT I-STAT 3, VENOUS BLOOD GAS (G3P V)
Acid-base deficit: 4 mmol/L — ABNORMAL HIGH (ref 0.0–2.0)
Bicarbonate: 20.5 mEq/L (ref 20.0–24.0)
O2 Saturation: 58 %
TCO2: 22 mmol/L (ref 0–100)
pCO2, Ven: 33 mmHg — ABNORMAL LOW (ref 45.0–50.0)
pH, Ven: 7.402 — ABNORMAL HIGH (ref 7.250–7.300)
pO2, Ven: 30 mmHg (ref 30.0–45.0)

## 2014-08-10 LAB — APTT: aPTT: 29 seconds (ref 24–37)

## 2014-08-10 LAB — PROTIME-INR
INR: 1.48 (ref 0.00–1.49)
Prothrombin Time: 18 seconds — ABNORMAL HIGH (ref 11.6–15.2)

## 2014-08-10 LAB — PREPARE RBC (CROSSMATCH)

## 2014-08-10 SURGERY — LEFT AND RIGHT HEART CATHETERIZATION WITH CORONARY ANGIOGRAM
Anesthesia: LOCAL

## 2014-08-10 MED ORDER — DEXTROSE 5 % IV SOLN
750.0000 mg | INTRAVENOUS | Status: DC
  Filled 2014-08-10 (×2): qty 750

## 2014-08-10 MED ORDER — HEPARIN SODIUM (PORCINE) 1000 UNIT/ML IJ SOLN
INTRAMUSCULAR | Status: AC
  Filled 2014-08-10: qty 1

## 2014-08-10 MED ORDER — CHLORHEXIDINE GLUCONATE 4 % EX LIQD
60.0000 mL | Freq: Once | CUTANEOUS | Status: AC
  Administered 2014-08-11: 4 via TOPICAL
  Filled 2014-08-10: qty 60

## 2014-08-10 MED ORDER — PHENYLEPHRINE HCL 10 MG/ML IJ SOLN
30.0000 ug/min | INTRAVENOUS | Status: DC
  Filled 2014-08-10: qty 2

## 2014-08-10 MED ORDER — DEXMEDETOMIDINE HCL IN NACL 400 MCG/100ML IV SOLN
0.1000 ug/kg/h | INTRAVENOUS | Status: AC
  Administered 2014-08-11: 0.3 ug/kg/h via INTRAVENOUS
  Filled 2014-08-10: qty 100

## 2014-08-10 MED ORDER — SODIUM CHLORIDE 0.9 % IV SOLN: INTRAVENOUS | Status: AC

## 2014-08-10 MED ORDER — SODIUM CHLORIDE 0.9 % IV SOLN
INTRAVENOUS | Status: AC
  Administered 2014-08-11: 69.8 mL/h via INTRAVENOUS
  Filled 2014-08-10: qty 40

## 2014-08-10 MED ORDER — HEPARIN SODIUM (PORCINE) 5000 UNIT/ML IJ SOLN: 5000.0000 [IU] | Freq: Three times a day (TID) | INTRAMUSCULAR | Status: DC

## 2014-08-10 MED ORDER — POTASSIUM CHLORIDE 2 MEQ/ML IV SOLN
80.0000 meq | INTRAVENOUS | Status: DC
  Filled 2014-08-10: qty 40

## 2014-08-10 MED ORDER — HEPARIN (PORCINE) IN NACL 2-0.9 UNIT/ML-% IJ SOLN
INTRAMUSCULAR | Status: AC
  Filled 2014-08-10: qty 1000

## 2014-08-10 MED ORDER — TEMAZEPAM 15 MG PO CAPS: 15.0000 mg | ORAL_CAPSULE | Freq: Once | ORAL | Status: AC | PRN

## 2014-08-10 MED ORDER — SODIUM CHLORIDE 0.9 % IV SOLN
INTRAVENOUS | Status: AC
  Administered 2014-08-11: 2.7 [IU]/h via INTRAVENOUS
  Filled 2014-08-10: qty 2.5

## 2014-08-10 MED ORDER — MAGNESIUM SULFATE 50 % IJ SOLN
40.0000 meq | INTRAMUSCULAR | Status: DC
  Filled 2014-08-10: qty 10

## 2014-08-10 MED ORDER — LIDOCAINE HCL (PF) 1 % IJ SOLN
INTRAMUSCULAR | Status: AC
  Filled 2014-08-10: qty 30

## 2014-08-10 MED ORDER — DEXTROSE 5 % IV SOLN
1.5000 g | INTRAVENOUS | Status: AC
  Administered 2014-08-11: 1.5 g via INTRAVENOUS
  Administered 2014-08-11: .75 g via INTRAVENOUS
  Filled 2014-08-10: qty 1.5

## 2014-08-10 MED ORDER — PLASMA-LYTE 148 IV SOLN
INTRAVENOUS | Status: AC
  Administered 2014-08-11: 500 mL
  Filled 2014-08-10: qty 2.5

## 2014-08-10 MED ORDER — VERAPAMIL HCL 2.5 MG/ML IV SOLN
INTRAVENOUS | Status: AC
  Filled 2014-08-10: qty 2

## 2014-08-10 MED ORDER — METOPROLOL TARTRATE 12.5 MG HALF TABLET
12.5000 mg | ORAL_TABLET | Freq: Once | ORAL | Status: AC
  Administered 2014-08-11: 12.5 mg via ORAL
  Filled 2014-08-10: qty 1

## 2014-08-10 MED ORDER — VANCOMYCIN HCL 10 G IV SOLR
1250.0000 mg | INTRAVENOUS | Status: AC
  Administered 2014-08-11: 1250 mg via INTRAVENOUS
  Filled 2014-08-10: qty 1250

## 2014-08-10 MED ORDER — CHLORHEXIDINE GLUCONATE 4 % EX LIQD
60.0000 mL | Freq: Once | CUTANEOUS | Status: AC
Start: 2014-08-10 — End: 2014-08-11
  Administered 2014-08-11: 4 via TOPICAL
  Filled 2014-08-10: qty 60

## 2014-08-10 MED ORDER — EPINEPHRINE HCL 1 MG/ML IJ SOLN
0.0000 ug/min | INTRAVENOUS | Status: DC
  Filled 2014-08-10: qty 4

## 2014-08-10 MED ORDER — OXYCODONE-ACETAMINOPHEN 5-325 MG PO TABS: 1.0000 | ORAL_TABLET | ORAL | Status: DC | PRN

## 2014-08-10 MED ORDER — VANCOMYCIN HCL 1000 MG IV SOLR
INTRAVENOUS | Status: AC
  Administered 2014-08-11: 09:00:00
  Filled 2014-08-10: qty 1000

## 2014-08-10 MED ORDER — BISACODYL 5 MG PO TBEC
5.0000 mg | DELAYED_RELEASE_TABLET | Freq: Once | ORAL | Status: AC
  Administered 2014-08-10: 5 mg via ORAL
  Filled 2014-08-10: qty 1

## 2014-08-10 MED ORDER — NITROGLYCERIN IN D5W 200-5 MCG/ML-% IV SOLN
2.0000 ug/min | INTRAVENOUS | Status: DC
  Filled 2014-08-10: qty 250

## 2014-08-10 MED ORDER — DOPAMINE-DEXTROSE 3.2-5 MG/ML-% IV SOLN
0.0000 ug/kg/min | INTRAVENOUS | Status: DC
  Filled 2014-08-10: qty 250

## 2014-08-10 MED ORDER — FENTANYL CITRATE 0.05 MG/ML IJ SOLN
INTRAMUSCULAR | Status: AC
  Filled 2014-08-10: qty 2

## 2014-08-10 MED ORDER — HEPARIN SODIUM (PORCINE) 1000 UNIT/ML IJ SOLN
INTRAMUSCULAR | Status: DC
  Filled 2014-08-10: qty 30

## 2014-08-10 MED ORDER — MIDAZOLAM HCL 2 MG/2ML IJ SOLN
INTRAMUSCULAR | Status: AC
  Administered 2014-08-11: 2 mg via INTRAVENOUS
  Filled 2014-08-10: qty 2

## 2014-08-10 NOTE — Progress Notes (Signed)
Patient returned to 2w10 from cath lab. Patient A/O, VSS, NSR on monitor, pain 0/10. TR band applied to right radial, site is C/D/I. POC discussed with patient, patient currently resting comfortably in bed at this time.

## 2014-08-10 NOTE — Progress Notes (Signed)
VASCULAR LAB PRELIMINARY  PRELIMINARY  PRELIMINARY  PRELIMINARY  Pre-op Cardiac Surgery  Carotid Findings:  Bilateral:  1-39% ICA stenosis.  Vertebral artery flow is antegrade.     Upper Extremity Right Left  Brachial Pressures 97 Triphasic 93 Triphasic  Radial Waveforms Triphasic Triphasic  Ulnar Waveforms Triphasic Triphasic  Palmar Arch (Allen's Test) Abnormal Normal   Findings:  Doppler waveforms obliterate with radial compression and remain normal with ulnar compression on the right. Left Doppler waveforms remained normal with both radial and ulnar compressions.    Lower  Extremity Right Left  Dorsalis Pedis    Anterior Tibial    Posterior Tibial    Ankle/Brachial Indices      Findings:  Pedal pulses were palpable bilaterally   Regene Mccarthy, RVS 08/10/2014, 1:02 PM

## 2014-08-10 NOTE — Progress Notes (Signed)
Patient Name: David [name] Date of Encounter: 08/10/2014     Active Problems:   Mitral valve disorder   Symptomatic anemia   Hemolytic anemia   Mitral regurgitation   Severe mitral regurgitation   Hx of mitral valve repair   Mitral valve regurgitation    SUBJECTIVE  Patient was admitted with recurrent severe mitral regurgitation.  He is scheduled for cardiac catheterization today.  He received 2 units blood transfusion yesterday.  Denies shortness of breath at rest.  No chest pain.  He has not been having any fevers or chills.  CURRENT MEDS . albuterol  2.5 mg Nebulization Once  . folic acid  1 mg Oral Daily  . loratadine  10 mg Oral Daily  . potassium chloride SA  40 mEq Oral BID  . sodium chloride  3 mL Intravenous Q12H    OBJECTIVE  Filed Vitals:   08/09/14 2112 08/09/14 2212 08/09/14 2317 08/10/14 0508  BP: 106/77 103/71 111/72 107/67  Pulse: 107 104 107 97  Temp: 98.2 F (36.8 C) 98.5 F (36.9 C) 98.3 F (36.8 C) 98.2 F (36.8 C)  TempSrc: Oral Oral Oral Oral  Resp: 17 17 17 17   Height:      Weight:      SpO2: 100% 100% 100% 97%    Intake/Output Summary (Last 24 hours) at 08/10/14 0742 Last data filed at 08/10/14 0700  Gross per 24 hour  Intake    575 ml  Output    650 ml  Net    -75 ml   Filed Weights   08/09/14 1214  Weight: 154 lb 1.6 oz (69.9 kg)    PHYSICAL EXAM  General: Pleasant, NAD. Neuro: Alert and oriented X 3. Moves all extremities spontaneously. Psych: Normal affect. HEENT:  Normal  Neck: Supple without bruits or JVD. Lungs:  Resp regular and unlabored, CTA. Heart: RRR no s3, s4, grade 2/6 holosystolic murmur of mitral regurgitation at apex Abdomen: Soft, non-tender, non-distended, BS + x 4.  Extremities: No clubbing, cyanosis or edema. DP/PT/Radials 2+ and equal bilaterally.  Accessory Clinical Findings  CBC  Recent Labs  08/10/14 0014 08/10/14 0347  WBC 5.5 6.4  NEUTROABS 3.4  --   HGB 10.4* 10.5*  HCT 32.8*  32.9*  MCV 81.4 82.5  PLT 194 431   Basic Metabolic Panel No results for input(s): NA, K, CL, CO2, GLUCOSE, BUN, CREATININE, CALCIUM, MG, PHOS in the last 72 hours. Liver Function Tests No results for input(s): AST, ALT, ALKPHOS, BILITOT, PROT, ALBUMIN in the last 72 hours. No results for input(s): LIPASE, AMYLASE in the last 72 hours. Cardiac Enzymes No results for input(s): CKTOTAL, CKMB, CKMBINDEX, TROPONINI in the last 72 hours. BNP Invalid input(s): POCBNP D-Dimer No results for input(s): DDIMER in the last 72 hours. Hemoglobin A1C No results for input(s): HGBA1C in the last 72 hours. Fasting Lipid Panel No results for input(s): CHOL, HDL, LDLCALC, TRIG, CHOLHDL, LDLDIRECT in the last 72 hours. Thyroid Function Tests No results for input(s): TSH, T4TOTAL, T3FREE, THYROIDAB in the last 72 hours.  Invalid input(s): FREET3  TELE  Normal sinus rhythm  ECG    Radiology/Studies  No results found.  ASSESSMENT AND PLAN  1. Severe mitral regurgitation Patient presented approximately 3 months ago with severe fatigue and has been diagnosed with hemolytic anemia. Transesophageal echocardiogram performed recently reveals normal LV function, previous mitral valve repair with severe mitral regurgitation possibly through perforated posterior mitral valve leaflet. His mitral regurgitation is clearly significant and  likely the cause of his severe hemolytic anemia as well.Dr Roxy Manns evaluated patient and plans mitral valve repair/replacement this admission. Cardiac catheterization planned for today.. The risks and benefits have been discussed and he agrees to proceed.   2. Anemia Hemolytic anemia.  Hemoglobin today stable at 10.5  Plan: Cardiac catheterization today.  Anticipate mitral valve surgery tomorrow.  Signed, Darlin Coco MD

## 2014-08-10 NOTE — Progress Notes (Signed)
TR band removed, pressure dressing with tegaderm applied. Right radial site C/D/I. Afleming, RN

## 2014-08-10 NOTE — Progress Notes (Signed)
Pt moving well. Discussed sternal precautions, mobility, IS and home care. Voiced understanding. Gave OHS book and guideline, discussed watching video. Instructed pt to walk as he likes.  [place] CES, ACSM 9:52 AM 08/10/2014

## 2014-08-10 NOTE — CV Procedure (Signed)
      Left and Right Heart Catheterization with Coronary Angiography Report  David [name]  54 y.o.  male Apr 05, 1961  Procedure Date: 08/10/2014 Referring Physician:  Kirk Ruths, M.D. Primary Cardiologist:  same  INDICATIONS:  Severe mitral regurgitation , status post mitral valve repair, and hemolytic anemia  PROCEDURE:  1. Left heart catheterization ; 2. Coronary angiography ; 3. Left ventriculography ; 4. Right heart catheterization  CONSENT:  The risks, benefits, and details of the procedure were explained in detail to the patient. Risks including death, stroke, heart attack, kidney injury, allergy, limb ischemia, bleeding and radiation injury were discussed.  The patient verbalized understanding and wanted to proceed.  Informed written consent was obtained.  PROCEDURE TECHNIQUE:   Right heart catheterization was performed using a right antecubital Angiocath inserted prior to arrival in the cath lab. Double glove procedure was performed to wire the Angiocath an exchange it with a 5 French brachial sheath using the modified Seldinger technique.  1% Xylocaine was used for local anesthesia. Right heart recordings including a main pulmonary artery O2 saturation was obtained via a 5 [place] balloon tipped Swan.  After Xylocaine anesthesia a  5 French Slender sheath was placed in the right  radial artery with an angiocath and the modified Seldinger technique.  Coronary angiography was done using a 5 F  JR 4 diagnostic catheter.  Left ventriculography was done using the  Angled 5 french pigtail catheter and power injection , using 45 cc of contrast at 18 cc/s.   the images were reviewed and the case was terminated.   CONTRAST:  Total of  90 cc.  COMPLICATIONS:    none  HEMODYNAMICS:  Aortic pressure  88/63 mmHg; LV pressure  92/5 mmHg; LVEDP  14 mmHg; RA  8 mmHg; RV   78/ 15 mmHg; PA  78/ 30 mmHg; PCWP(mean)  26 mmHg and the V-wave to 42 mmHg; Cardiac Output  9.16 L/m (likely in a   error ); AV gradient  None.  ANGIOGRAPHIC DATA:   The left main coronary artery is  normal.  The left anterior descending artery is  Widely patent with eccentric 40% mid vessel narrowing.  The left circumflex artery is small and widely patent giving to obtuse marginal branches.   The ramus intermedius is widely patent.  The right coronary artery is dominant and normal.   LEFT VENTRICULOGRAM:  Left ventricular angiogram was done in the 30 RAO projection and revealed a normal cavity size and severe regurgitation with left atrial enlargement. Estimated ejection fraction 55%.   IMPRESSIONS:      1. Severe mitral regurgitation  2. Preserved left ventricular systolic function  3.  Normal coronary arteries  4. Severe pulmonary artery hypertension (PA systolic greater than 75 mmHg)   RECOMMENDATION:   Proceed with surgery as outlined by Dr. Darylene Price.  IV hydration with 125 cc of saline 4 hours then discontinue.

## 2014-08-11 ENCOUNTER — Encounter (HOSPITAL_COMMUNITY): Payer: Self-pay | Admitting: Certified Registered Nurse Anesthetist

## 2014-08-11 ENCOUNTER — Encounter (HOSPITAL_COMMUNITY)
Admission: RE | Disposition: A | Discharge status: Home / Self Care | Payer: 59 | Source: Ambulatory Visit | Attending: Thoracic Surgery (Cardiothoracic Vascular Surgery)

## 2014-08-11 ENCOUNTER — Other Ambulatory Visit: Payer: Self-pay

## 2014-08-11 ENCOUNTER — Inpatient Hospital Stay (HOSPITAL_COMMUNITY): Payer: 59

## 2014-08-11 ENCOUNTER — Inpatient Hospital Stay (HOSPITAL_COMMUNITY): Payer: 59 | Admitting: Anesthesiology

## 2014-08-11 DIAGNOSIS — I340 Nonrheumatic mitral (valve) insufficiency: Secondary | ICD-10-CM

## 2014-08-11 DIAGNOSIS — Z954 Presence of other heart-valve replacement: Secondary | ICD-10-CM

## 2014-08-11 HISTORY — PX: TEE WITHOUT CARDIOVERSION: SHX5443

## 2014-08-11 HISTORY — PX: MITRAL VALVE REPAIR: SHX2039

## 2014-08-11 LAB — POCT I-STAT 3, ART BLOOD GAS (G3+)
Acid-base deficit: 1 mmol/L (ref 0.0–2.0)
Acid-base deficit: 3 mmol/L — ABNORMAL HIGH (ref 0.0–2.0)
Acid-base deficit: 4 mmol/L — ABNORMAL HIGH (ref 0.0–2.0)
Acid-base deficit: 6 mmol/L — ABNORMAL HIGH (ref 0.0–2.0)
Bicarbonate: 19.8 mEq/L — ABNORMAL LOW (ref 20.0–24.0)
Bicarbonate: 21.7 mEq/L (ref 20.0–24.0)
Bicarbonate: 22.1 mEq/L (ref 20.0–24.0)
Bicarbonate: 24.6 mEq/L — ABNORMAL HIGH (ref 20.0–24.0)
O2 Saturation: 100 %
O2 Saturation: 100 %
O2 Saturation: 89 %
O2 Saturation: 95 %
Patient temperature: 36.9
Patient temperature: 38.4
TCO2: 21 mmol/L (ref 0–100)
TCO2: 23 mmol/L (ref 0–100)
TCO2: 23 mmol/L (ref 0–100)
TCO2: 26 mmol/L (ref 0–100)
pCO2 arterial: 31.7 mmHg — ABNORMAL LOW (ref 35.0–45.0)
pCO2 arterial: 37.2 mmHg (ref 35.0–45.0)
pCO2 arterial: 45.3 mmHg — ABNORMAL HIGH (ref 35.0–45.0)
pCO2 arterial: 50.8 mmHg — ABNORMAL HIGH (ref 35.0–45.0)
pH, Arterial: 7.237 — ABNORMAL LOW (ref 7.350–7.450)
pH, Arterial: 7.342 — ABNORMAL LOW (ref 7.350–7.450)
pH, Arterial: 7.382 (ref 7.350–7.450)
pH, Arterial: 7.41 (ref 7.350–7.450)
pO2, Arterial: 321 mmHg — ABNORMAL HIGH (ref 80.0–100.0)
pO2, Arterial: 337 mmHg — ABNORMAL HIGH (ref 80.0–100.0)
pO2, Arterial: 60 mmHg — ABNORMAL LOW (ref 80.0–100.0)
pO2, Arterial: 78 mmHg — ABNORMAL LOW (ref 80.0–100.0)

## 2014-08-11 LAB — DIC (DISSEMINATED INTRAVASCULAR COAGULATION)PANEL
Fibrinogen: 198 mg/dL — ABNORMAL LOW (ref 204–475)
INR: 2.55 — ABNORMAL HIGH (ref 0.00–1.49)
Platelets: 96 10*3/uL — ABNORMAL LOW (ref 150–400)
Prothrombin Time: 27.7 seconds — ABNORMAL HIGH (ref 11.6–15.2)
aPTT: 42 seconds — ABNORMAL HIGH (ref 24–37)

## 2014-08-11 LAB — GRAM STAIN: Gram Stain: NONE SEEN

## 2014-08-11 LAB — POCT I-STAT, CHEM 8
BUN: 13 mg/dL (ref 6–23)
BUN: 11 mg/dL (ref 6–23)
BUN: 12 mg/dL (ref 6–23)
BUN: 11 mg/dL (ref 6–23)
BUN: 12 mg/dL (ref 6–23)
BUN: 11 mg/dL (ref 6–23)
BUN: 10 mg/dL (ref 6–23)
BUN: 10 mg/dL (ref 6–23)
Calcium, Ion: 1.24 mmol/L — ABNORMAL HIGH (ref 1.12–1.23)
Calcium, Ion: 1.14 mmol/L (ref 1.12–1.23)
Calcium, Ion: 1.25 mmol/L — ABNORMAL HIGH (ref 1.12–1.23)
Calcium, Ion: 1.22 mmol/L (ref 1.12–1.23)
Calcium, Ion: 1.08 mmol/L — ABNORMAL LOW (ref 1.12–1.23)
Calcium, Ion: 1.11 mmol/L — ABNORMAL LOW (ref 1.12–1.23)
Calcium, Ion: 1.08 mmol/L — ABNORMAL LOW (ref 1.12–1.23)
Calcium, Ion: 1.21 mmol/L (ref 1.12–1.23)
Chloride: 106 mmol/L (ref 96–112)
Chloride: 105 mmol/L (ref 96–112)
Chloride: 106 mmol/L (ref 96–112)
Chloride: 105 mmol/L (ref 96–112)
Chloride: 103 mmol/L (ref 96–112)
Chloride: 108 mmol/L (ref 96–112)
Chloride: 105 mmol/L (ref 96–112)
Chloride: 105 mmol/L (ref 96–112)
Creatinine, Ser: 0.8 mg/dL (ref 0.50–1.35)
Creatinine, Ser: 0.7 mg/dL (ref 0.50–1.35)
Creatinine, Ser: 0.8 mg/dL (ref 0.50–1.35)
Creatinine, Ser: 0.9 mg/dL (ref 0.50–1.35)
Creatinine, Ser: 0.8 mg/dL (ref 0.50–1.35)
Creatinine, Ser: 0.8 mg/dL (ref 0.50–1.35)
Creatinine, Ser: 0.8 mg/dL (ref 0.50–1.35)
Creatinine, Ser: 0.9 mg/dL (ref 0.50–1.35)
Glucose, Bld: 151 mg/dL — ABNORMAL HIGH (ref 70–99)
Glucose, Bld: 104 mg/dL — ABNORMAL HIGH (ref 70–99)
Glucose, Bld: 98 mg/dL (ref 70–99)
Glucose, Bld: 182 mg/dL — ABNORMAL HIGH (ref 70–99)
Glucose, Bld: 168 mg/dL — ABNORMAL HIGH (ref 70–99)
Glucose, Bld: 139 mg/dL — ABNORMAL HIGH (ref 70–99)
Glucose, Bld: 130 mg/dL — ABNORMAL HIGH (ref 70–99)
Glucose, Bld: 103 mg/dL — ABNORMAL HIGH (ref 70–99)
HCT: 28 % — ABNORMAL LOW (ref 39.0–52.0)
HCT: 22 % — ABNORMAL LOW (ref 39.0–52.0)
HCT: 27 % — ABNORMAL LOW (ref 39.0–52.0)
HCT: 21 % — ABNORMAL LOW (ref 39.0–52.0)
HCT: 25 % — ABNORMAL LOW (ref 39.0–52.0)
HCT: 28 % — ABNORMAL LOW (ref 39.0–52.0)
HCT: 26 % — ABNORMAL LOW (ref 39.0–52.0)
HCT: 28 % — ABNORMAL LOW (ref 39.0–52.0)
Hemoglobin: 9.5 g/dL — ABNORMAL LOW (ref 13.0–17.0)
Hemoglobin: 7.5 g/dL — ABNORMAL LOW (ref 13.0–17.0)
Hemoglobin: 9.2 g/dL — ABNORMAL LOW (ref 13.0–17.0)
Hemoglobin: 7.1 g/dL — ABNORMAL LOW (ref 13.0–17.0)
Hemoglobin: 8.5 g/dL — ABNORMAL LOW (ref 13.0–17.0)
Hemoglobin: 9.5 g/dL — ABNORMAL LOW (ref 13.0–17.0)
Hemoglobin: 8.8 g/dL — ABNORMAL LOW (ref 13.0–17.0)
Hemoglobin: 9.5 g/dL — ABNORMAL LOW (ref 13.0–17.0)
Potassium: 3.8 mmol/L (ref 3.5–5.1)
Potassium: 4 mmol/L (ref 3.5–5.1)
Potassium: 4.3 mmol/L (ref 3.5–5.1)
Potassium: 4.5 mmol/L (ref 3.5–5.1)
Potassium: 4.4 mmol/L (ref 3.5–5.1)
Potassium: 5.6 mmol/L — ABNORMAL HIGH (ref 3.5–5.1)
Potassium: 4.1 mmol/L (ref 3.5–5.1)
Potassium: 3.8 mmol/L (ref 3.5–5.1)
Sodium: 139 mmol/L (ref 135–145)
Sodium: 139 mmol/L (ref 135–145)
Sodium: 140 mmol/L (ref 135–145)
Sodium: 138 mmol/L (ref 135–145)
Sodium: 140 mmol/L (ref 135–145)
Sodium: 139 mmol/L (ref 135–145)
Sodium: 141 mmol/L (ref 135–145)
Sodium: 140 mmol/L (ref 135–145)
TCO2: 20 mmol/L (ref 0–100)
TCO2: 20 mmol/L (ref 0–100)
TCO2: 24 mmol/L (ref 0–100)
TCO2: 21 mmol/L (ref 0–100)
TCO2: 23 mmol/L (ref 0–100)
TCO2: 19 mmol/L (ref 0–100)
TCO2: 20 mmol/L (ref 0–100)
TCO2: 18 mmol/L (ref 0–100)

## 2014-08-11 LAB — POCT I-STAT 4, (NA,K, GLUC, HGB,HCT)
Glucose, Bld: 87 mg/dL (ref 70–99)
HCT: 30 % — ABNORMAL LOW (ref 39.0–52.0)
Hemoglobin: 10.2 g/dL — ABNORMAL LOW (ref 13.0–17.0)
Potassium: 3.9 mmol/L (ref 3.5–5.1)
Sodium: 143 mmol/L (ref 135–145)

## 2014-08-11 LAB — CBC
HCT: 32.3 % — ABNORMAL LOW (ref 39.0–52.0)
HCT: 27.7 % — ABNORMAL LOW (ref 39.0–52.0)
Hemoglobin: 10.6 g/dL — ABNORMAL LOW (ref 13.0–17.0)
Hemoglobin: 9.4 g/dL — ABNORMAL LOW (ref 13.0–17.0)
MCH: 26.9 pg (ref 26.0–34.0)
MCH: 27.3 pg (ref 26.0–34.0)
MCHC: 32.8 g/dL (ref 30.0–36.0)
MCHC: 33.9 g/dL (ref 30.0–36.0)
MCV: 82 fL (ref 78.0–100.0)
MCV: 80.5 fL (ref 78.0–100.0)
Platelets: 139 10*3/uL — ABNORMAL LOW (ref 150–400)
Platelets: 171 10*3/uL (ref 150–400)
RBC: 3.94 MIL/uL — ABNORMAL LOW (ref 4.22–5.81)
RBC: 3.44 MIL/uL — ABNORMAL LOW (ref 4.22–5.81)
RDW: 23.9 % — ABNORMAL HIGH (ref 11.5–15.5)
RDW: 24.6 % — ABNORMAL HIGH (ref 11.5–15.5)
WBC: 12.7 10*3/uL — ABNORMAL HIGH (ref 4.0–10.5)
WBC: 10.1 10*3/uL (ref 4.0–10.5)

## 2014-08-11 LAB — GLUCOSE, CAPILLARY
Glucose-Capillary: 102 mg/dL — ABNORMAL HIGH (ref 70–99)
Glucose-Capillary: 96 mg/dL (ref 70–99)
Glucose-Capillary: 103 mg/dL — ABNORMAL HIGH (ref 70–99)
Glucose-Capillary: 94 mg/dL (ref 70–99)
Glucose-Capillary: 120 mg/dL — ABNORMAL HIGH (ref 70–99)
Glucose-Capillary: 101 mg/dL — ABNORMAL HIGH (ref 70–99)
Glucose-Capillary: 96 mg/dL (ref 70–99)

## 2014-08-11 LAB — MAGNESIUM: Magnesium: 2.4 mg/dL (ref 1.5–2.5)

## 2014-08-11 LAB — PROTIME-INR
INR: 1.92 — ABNORMAL HIGH (ref 0.00–1.49)
Prothrombin Time: 22.1 seconds — ABNORMAL HIGH (ref 11.6–15.2)

## 2014-08-11 LAB — HEMOGLOBIN A1C
Hgb A1c MFr Bld: 5.3 % (ref 4.8–5.6)
Mean Plasma Glucose: 105 mg/dL

## 2014-08-11 LAB — DIC (DISSEMINATED INTRAVASCULAR COAGULATION) PANEL: D-Dimer, Quant: 1.49 ug/mL-FEU — ABNORMAL HIGH (ref 0.00–0.48)

## 2014-08-11 LAB — CREATININE, SERUM
Creatinine, Ser: 1 mg/dL (ref 0.50–1.35)
GFR calc Af Amer: >90 mL/min (ref >90)
GFR calc non Af Amer: 83 mL/min — ABNORMAL LOW (ref >90)

## 2014-08-11 LAB — APTT: aPTT: 35 seconds (ref 24–37)

## 2014-08-11 LAB — PLATELET COUNT: Platelets: 117 10*3/uL — ABNORMAL LOW (ref 150–400)

## 2014-08-11 LAB — SURGICAL PCR SCREEN
MRSA, PCR: NEGATIVE
Staphylococcus aureus: NEGATIVE

## 2014-08-11 LAB — PREALBUMIN: Prealbumin: 25 mg/dL (ref 21–43)

## 2014-08-11 SURGERY — REPAIR, MITRAL VALVE, REPEAT
Anesthesia: General | Site: Chest

## 2014-08-11 MED ORDER — HEPARIN SODIUM (PORCINE) 1000 UNIT/ML IJ SOLN
INTRAMUSCULAR | Status: AC
  Filled 2014-08-11: qty 1

## 2014-08-11 MED ORDER — LIDOCAINE HCL (CARDIAC) 20 MG/ML IV SOLN
INTRAVENOUS | Status: DC | PRN
  Administered 2014-08-11: 50 mg via INTRAVENOUS

## 2014-08-11 MED ORDER — LACTATED RINGERS IV SOLN
INTRAVENOUS | Status: DC
  Administered 2014-08-11: 17:00:00 via INTRAVENOUS

## 2014-08-11 MED ORDER — INSULIN REGULAR BOLUS VIA INFUSION
0.0000 [IU] | Freq: Three times a day (TID) | INTRAVENOUS | Status: DC
  Filled 2014-08-11: qty 10

## 2014-08-11 MED ORDER — SODIUM CHLORIDE 0.9 % IV SOLN
0.5000 g/h | Freq: Once | INTRAVENOUS | Status: DC
  Filled 2014-08-11: qty 20

## 2014-08-11 MED ORDER — MIDAZOLAM HCL 10 MG/2ML IJ SOLN
INTRAMUSCULAR | Status: AC
  Filled 2014-08-11: qty 2

## 2014-08-11 MED ORDER — SODIUM CHLORIDE 0.9 % IR SOLN
Status: DC | PRN
  Administered 2014-08-11: 6000 mL

## 2014-08-11 MED ORDER — DOPAMINE-DEXTROSE 1.6-5 MG/ML-% IV SOLN
INTRAVENOUS | Status: DC | PRN
Start: 2014-08-11 — End: 2014-08-11
  Administered 2014-08-11: 3 ug/kg/min via INTRAVENOUS

## 2014-08-11 MED ORDER — GLUTARALDEHYDE 0.625% SOAKING SOLUTION
TOPICAL | Status: AC
  Administered 2014-08-11: 1 via TOPICAL
  Filled 2014-08-11: qty 50

## 2014-08-11 MED ORDER — MAGNESIUM SULFATE 4 GM/100ML IV SOLN
4.0000 g | Freq: Once | INTRAVENOUS | Status: AC
  Administered 2014-08-11: 4 g via INTRAVENOUS
  Filled 2014-08-11: qty 100

## 2014-08-11 MED ORDER — SODIUM BICARBONATE 8.4 % IV SOLN
INTRAVENOUS | Status: AC
  Filled 2014-08-11: qty 50

## 2014-08-11 MED ORDER — NITROGLYCERIN IN D5W 200-5 MCG/ML-% IV SOLN: 0.0000 ug/min | INTRAVENOUS | Status: DC

## 2014-08-11 MED ORDER — METOPROLOL TARTRATE 12.5 MG HALF TABLET
12.5000 mg | ORAL_TABLET | Freq: Two times a day (BID) | ORAL | Status: DC
  Filled 2014-08-11 (×3): qty 1

## 2014-08-11 MED ORDER — SODIUM CHLORIDE 0.45 % IV SOLN
INTRAVENOUS | Status: DC | PRN
  Administered 2014-08-11: 15:00:00 via INTRAVENOUS

## 2014-08-11 MED ORDER — ONDANSETRON HCL 4 MG/2ML IJ SOLN: 4.0000 mg | Freq: Four times a day (QID) | INTRAMUSCULAR | Status: DC | PRN

## 2014-08-11 MED ORDER — ROCURONIUM BROMIDE 100 MG/10ML IV SOLN
INTRAVENOUS | Status: DC | PRN
  Administered 2014-08-11 (×6): 50 mg via INTRAVENOUS

## 2014-08-11 MED ORDER — POTASSIUM CHLORIDE 10 MEQ/50ML IV SOLN
10.0000 meq | INTRAVENOUS | Status: AC
  Administered 2014-08-11 (×3): 10 meq via INTRAVENOUS

## 2014-08-11 MED ORDER — DEXMEDETOMIDINE HCL IN NACL 200 MCG/50ML IV SOLN
0.0000 ug/kg/h | INTRAVENOUS | Status: DC
  Administered 2014-08-11: 0.7 ug/kg/h via INTRAVENOUS
  Filled 2014-08-11: qty 50

## 2014-08-11 MED ORDER — ACETAMINOPHEN 160 MG/5ML PO SOLN
1000.0000 mg | Freq: Four times a day (QID) | ORAL | Status: DC
  Administered 2014-08-12: 1000 mg

## 2014-08-11 MED ORDER — ARTIFICIAL TEARS OP OINT
TOPICAL_OINTMENT | OPHTHALMIC | Status: DC | PRN
Start: 2014-08-11 — End: 2014-08-11
  Administered 2014-08-11: 1 via OPHTHALMIC

## 2014-08-11 MED ORDER — MIDAZOLAM HCL 2 MG/2ML IJ SOLN
2.0000 mg | INTRAMUSCULAR | Status: DC | PRN
  Administered 2014-08-11: 2 mg via INTRAVENOUS
  Filled 2014-08-11: qty 2

## 2014-08-11 MED ORDER — FENTANYL CITRATE 0.05 MG/ML IJ SOLN
INTRAMUSCULAR | Status: AC
  Filled 2014-08-11: qty 5

## 2014-08-11 MED ORDER — HEPARIN SODIUM (PORCINE) 1000 UNIT/ML IJ SOLN
INTRAMUSCULAR | Status: DC | PRN
  Administered 2014-08-11: 25000 [IU] via INTRAVENOUS

## 2014-08-11 MED ORDER — ACETAMINOPHEN 500 MG PO TABS
1000.0000 mg | ORAL_TABLET | Freq: Four times a day (QID) | ORAL | Status: AC
  Administered 2014-08-12 – 2014-08-16 (×17): 1000 mg via ORAL
  Filled 2014-08-11 (×22): qty 2

## 2014-08-11 MED ORDER — MIDAZOLAM HCL 2 MG/2ML IJ SOLN
INTRAMUSCULAR | Status: AC
  Filled 2014-08-11: qty 2

## 2014-08-11 MED ORDER — DOPAMINE-DEXTROSE 3.2-5 MG/ML-% IV SOLN: 0.0000 ug/kg/min | INTRAVENOUS | Status: DC

## 2014-08-11 MED ORDER — MILRINONE IN DEXTROSE 20 MG/100ML IV SOLN
0.1250 ug/kg/min | INTRAVENOUS | Status: DC
  Filled 2014-08-11: qty 100

## 2014-08-11 MED ORDER — DEXTROSE 5 % IV SOLN
10.0000 mg | INTRAVENOUS | Status: DC | PRN
  Administered 2014-08-11: 50 ug/min via INTRAVENOUS

## 2014-08-11 MED ORDER — BISACODYL 10 MG RE SUPP: 10.0000 mg | Freq: Every day | RECTAL | Status: DC

## 2014-08-11 MED ORDER — METOPROLOL TARTRATE 1 MG/ML IV SOLN: 2.5000 mg | INTRAVENOUS | Status: DC | PRN

## 2014-08-11 MED ORDER — ACETAMINOPHEN 650 MG RE SUPP
650.0000 mg | Freq: Once | RECTAL | Status: AC
  Administered 2014-08-11: 650 mg via RECTAL

## 2014-08-11 MED ORDER — SODIUM CHLORIDE 0.9 % IV SOLN
INTRAVENOUS | Status: AC
  Administered 2014-08-11: 17:00:00 via INTRAVENOUS

## 2014-08-11 MED ORDER — SODIUM BICARBONATE 8.4 % IV SOLN
INTRAVENOUS | Status: DC | PRN
  Administered 2014-08-11: 50 meq via INTRAVENOUS

## 2014-08-11 MED ORDER — POTASSIUM CHLORIDE 10 MEQ/50ML IV SOLN
10.0000 meq | INTRAVENOUS | Status: AC
  Administered 2014-08-11 – 2014-08-12 (×3): 10 meq via INTRAVENOUS

## 2014-08-11 MED ORDER — ROCURONIUM BROMIDE 50 MG/5ML IV SOLN
INTRAVENOUS | Status: AC
  Filled 2014-08-11: qty 5

## 2014-08-11 MED ORDER — MILRINONE IN DEXTROSE 20 MG/100ML IV SOLN
0.3000 ug/kg/min | INTRAVENOUS | Status: DC
Start: 2014-08-11 — End: 2014-08-12
  Filled 2014-08-11: qty 100

## 2014-08-11 MED ORDER — MORPHINE SULFATE 2 MG/ML IJ SOLN
2.0000 mg | INTRAMUSCULAR | Status: DC | PRN
  Administered 2014-08-12: 2 mg via INTRAVENOUS
  Administered 2014-08-12: 4 mg via INTRAVENOUS
  Administered 2014-08-12 (×2): 2 mg via INTRAVENOUS
  Administered 2014-08-12: 4 mg via INTRAVENOUS
  Administered 2014-08-12: 2 mg via INTRAVENOUS
  Filled 2014-08-11: qty 1
  Filled 2014-08-11: qty 2
  Filled 2014-08-11 (×3): qty 1
  Filled 2014-08-11: qty 2

## 2014-08-11 MED ORDER — CALCIUM GLUCONATE 10 % IV SOLN: 1.0000 g | Freq: Once | INTRAVENOUS | Status: DC

## 2014-08-11 MED ORDER — CALCIUM CHLORIDE 10 % IV SOLN
1.0000 g | Freq: Once | INTRAVENOUS | Status: AC
  Administered 2014-08-11: 1 g via INTRAVENOUS
  Filled 2014-08-11: qty 10

## 2014-08-11 MED ORDER — PROTAMINE SULFATE 10 MG/ML IV SOLN
INTRAVENOUS | Status: AC
  Filled 2014-08-11: qty 25

## 2014-08-11 MED ORDER — PHENYLEPHRINE HCL 10 MG/ML IJ SOLN
0.0000 ug/min | INTRAVENOUS | Status: DC
  Administered 2014-08-12: 50 ug/min via INTRAVENOUS
  Filled 2014-08-11 (×4): qty 2

## 2014-08-11 MED ORDER — FAMOTIDINE IN NACL 20-0.9 MG/50ML-% IV SOLN: 20.0000 mg | Freq: Two times a day (BID) | INTRAVENOUS | Status: DC

## 2014-08-11 MED ORDER — LACTATED RINGERS IV SOLN: 500.0000 mL | Freq: Once | INTRAVENOUS | Status: AC | PRN

## 2014-08-11 MED ORDER — DEXTROSE 5 % IV SOLN
1.5000 g | Freq: Two times a day (BID) | INTRAVENOUS | Status: AC
  Administered 2014-08-11 – 2014-08-13 (×4): 1.5 g via INTRAVENOUS
  Filled 2014-08-11 (×4): qty 1.5

## 2014-08-11 MED ORDER — SODIUM CHLORIDE 0.9 % IV SOLN
INTRAVENOUS | Status: DC
  Filled 2014-08-11: qty 2.5

## 2014-08-11 MED ORDER — ALBUMIN HUMAN 5 % IV SOLN
INTRAVENOUS | Status: DC | PRN
  Administered 2014-08-11: 14:00:00 via INTRAVENOUS

## 2014-08-11 MED ORDER — ASPIRIN EC 325 MG PO TBEC
325.0000 mg | DELAYED_RELEASE_TABLET | Freq: Every day | ORAL | Status: DC
  Administered 2014-08-12 – 2014-08-14 (×3): 325 mg via ORAL
  Filled 2014-08-11 (×3): qty 1

## 2014-08-11 MED ORDER — DOCUSATE SODIUM 100 MG PO CAPS
200.0000 mg | ORAL_CAPSULE | Freq: Every day | ORAL | Status: DC
  Administered 2014-08-12 – 2014-08-16 (×5): 200 mg via ORAL
  Filled 2014-08-11 (×6): qty 2

## 2014-08-11 MED ORDER — PANTOPRAZOLE SODIUM 40 MG PO TBEC
40.0000 mg | DELAYED_RELEASE_TABLET | Freq: Every day | ORAL | Status: DC
  Administered 2014-08-13 – 2014-08-17 (×5): 40 mg via ORAL
  Filled 2014-08-11 (×5): qty 1

## 2014-08-11 MED ORDER — MILRINONE IN DEXTROSE 20 MG/100ML IV SOLN
INTRAVENOUS | Status: DC | PRN
  Administered 2014-08-11: .3 ug/kg/min via INTRAVENOUS

## 2014-08-11 MED ORDER — ARTIFICIAL TEARS OP OINT
TOPICAL_OINTMENT | OPHTHALMIC | Status: AC
  Filled 2014-08-11: qty 3.5

## 2014-08-11 MED ORDER — PROPOFOL 10 MG/ML IV BOLUS
INTRAVENOUS | Status: AC
  Filled 2014-08-11: qty 20

## 2014-08-11 MED ORDER — NOREPINEPHRINE BITARTRATE 1 MG/ML IV SOLN
0.0000 ug/min | INTRAVENOUS | Status: AC
  Administered 2014-08-11: 2.677 ug/min via INTRAVENOUS
  Filled 2014-08-11: qty 4

## 2014-08-11 MED ORDER — SODIUM CHLORIDE 0.9 % IV SOLN
Freq: Once | INTRAVENOUS | Status: AC
  Administered 2014-08-11: 18:00:00 via INTRAVENOUS

## 2014-08-11 MED ORDER — SODIUM CHLORIDE 0.9 % IJ SOLN
OROMUCOSAL | Status: DC | PRN
  Administered 2014-08-11 (×4): via TOPICAL

## 2014-08-11 MED ORDER — LIDOCAINE HCL (CARDIAC) 20 MG/ML IV SOLN
INTRAVENOUS | Status: AC
  Filled 2014-08-11: qty 5

## 2014-08-11 MED ORDER — SODIUM CHLORIDE 0.9 % IR SOLN
Status: DC | PRN
  Administered 2014-08-11: 3000 mL

## 2014-08-11 MED ORDER — MIDAZOLAM HCL 5 MG/5ML IJ SOLN
INTRAMUSCULAR | Status: DC | PRN
  Administered 2014-08-11: 1 mg via INTRAVENOUS
  Administered 2014-08-11 (×3): 2 mg via INTRAVENOUS
  Administered 2014-08-11 (×3): 1 mg via INTRAVENOUS
  Administered 2014-08-11: 2 mg via INTRAVENOUS

## 2014-08-11 MED ORDER — MORPHINE SULFATE 2 MG/ML IJ SOLN
1.0000 mg | INTRAMUSCULAR | Status: AC | PRN
  Administered 2014-08-11 (×2): 2 mg via INTRAVENOUS
  Administered 2014-08-11: 1 mg via INTRAVENOUS
  Filled 2014-08-11 (×3): qty 1

## 2014-08-11 MED ORDER — SODIUM CHLORIDE 0.9 % IV SOLN: 250.0000 mL | INTRAVENOUS | Status: DC

## 2014-08-11 MED ORDER — SODIUM CHLORIDE 0.9 % IJ SOLN
3.0000 mL | Freq: Two times a day (BID) | INTRAMUSCULAR | Status: DC
  Administered 2014-08-12 – 2014-08-14 (×5): 3 mL via INTRAVENOUS

## 2014-08-11 MED ORDER — PHENYLEPHRINE 40 MCG/ML (10ML) SYRINGE FOR IV PUSH (FOR BLOOD PRESSURE SUPPORT)
PREFILLED_SYRINGE | INTRAVENOUS | Status: AC
  Filled 2014-08-11: qty 10

## 2014-08-11 MED ORDER — PROTAMINE SULFATE 10 MG/ML IV SOLN
INTRAVENOUS | Status: DC | PRN
  Administered 2014-08-11: 250 mg via INTRAVENOUS

## 2014-08-11 MED ORDER — TRAMADOL HCL 50 MG PO TABS: 50.0000 mg | ORAL_TABLET | ORAL | Status: DC | PRN

## 2014-08-11 MED ORDER — SODIUM CHLORIDE 0.9 % IJ SOLN: 3.0000 mL | INTRAMUSCULAR | Status: DC | PRN

## 2014-08-11 MED ORDER — CETYLPYRIDINIUM CHLORIDE 0.05 % MT LIQD
7.0000 mL | Freq: Four times a day (QID) | OROMUCOSAL | Status: DC
  Administered 2014-08-12 (×2): 7 mL via OROMUCOSAL

## 2014-08-11 MED ORDER — METOPROLOL TARTRATE 25 MG/10 ML ORAL SUSPENSION
12.5000 mg | Freq: Two times a day (BID) | ORAL | Status: DC
  Filled 2014-08-11 (×3): qty 5

## 2014-08-11 MED ORDER — ALBUMIN HUMAN 5 % IV SOLN
250.0000 mL | INTRAVENOUS | Status: AC | PRN
  Administered 2014-08-11: 250 mL via INTRAVENOUS

## 2014-08-11 MED ORDER — CALCIUM CHLORIDE 10 % IV SOLN
INTRAVENOUS | Status: DC | PRN
  Administered 2014-08-11 (×2): .2 g via INTRAVENOUS
  Administered 2014-08-11 (×2): .3 g via INTRAVENOUS

## 2014-08-11 MED ORDER — VANCOMYCIN HCL IN DEXTROSE 1-5 GM/200ML-% IV SOLN
1000.0000 mg | Freq: Once | INTRAVENOUS | Status: AC
  Administered 2014-08-11: 1000 mg via INTRAVENOUS
  Filled 2014-08-11: qty 200

## 2014-08-11 MED ORDER — PROPOFOL 10 MG/ML IV BOLUS
INTRAVENOUS | Status: DC | PRN
  Administered 2014-08-11: 30 mg via INTRAVENOUS
  Administered 2014-08-11: 200 mg via INTRAVENOUS

## 2014-08-11 MED ORDER — OXYCODONE HCL 5 MG PO TABS
5.0000 mg | ORAL_TABLET | ORAL | Status: DC | PRN
  Administered 2014-08-12 – 2014-08-17 (×17): 10 mg via ORAL
  Administered 2014-08-17: 5 mg via ORAL
  Filled 2014-08-11: qty 2
  Filled 2014-08-11: qty 1
  Filled 2014-08-11 (×17): qty 2

## 2014-08-11 MED ORDER — BISACODYL 5 MG PO TBEC
10.0000 mg | DELAYED_RELEASE_TABLET | Freq: Every day | ORAL | Status: DC
  Administered 2014-08-12 – 2014-08-16 (×5): 10 mg via ORAL
  Filled 2014-08-11 (×5): qty 2

## 2014-08-11 MED ORDER — EPHEDRINE SULFATE 50 MG/ML IJ SOLN
INTRAMUSCULAR | Status: AC
  Filled 2014-08-11: qty 1

## 2014-08-11 MED ORDER — CHLORHEXIDINE GLUCONATE 0.12 % MT SOLN
15.0000 mL | Freq: Two times a day (BID) | OROMUCOSAL | Status: DC
  Administered 2014-08-11: 15 mL via OROMUCOSAL
  Filled 2014-08-11: qty 15

## 2014-08-11 MED ORDER — FENTANYL CITRATE 0.05 MG/ML IJ SOLN
INTRAMUSCULAR | Status: DC | PRN
  Administered 2014-08-11: 100 ug via INTRAVENOUS
  Administered 2014-08-11 (×2): 50 ug via INTRAVENOUS
  Administered 2014-08-11 (×2): 150 ug via INTRAVENOUS
  Administered 2014-08-11: 100 ug via INTRAVENOUS
  Administered 2014-08-11: 150 ug via INTRAVENOUS

## 2014-08-11 MED ORDER — ACETAMINOPHEN 160 MG/5ML PO SOLN
650.0000 mg | Freq: Once | ORAL | Status: AC
Start: 2014-08-11 — End: 2014-08-11

## 2014-08-11 MED ORDER — ASPIRIN 81 MG PO CHEW: 324.0000 mg | CHEWABLE_TABLET | Freq: Every day | ORAL | Status: DC

## 2014-08-11 MED ORDER — SODIUM CHLORIDE 0.9 % IV SOLN
INTRAVENOUS | Status: DC
  Administered 2014-08-11: 17:00:00 via INTRAVENOUS

## 2014-08-11 MED ORDER — PLASMA-LYTE A IV SOLN: INTRAVENOUS | Status: DC | PRN

## 2014-08-11 MED ORDER — LACTATED RINGERS IV SOLN
INTRAVENOUS | Status: DC | PRN
  Administered 2014-08-11 (×2): via INTRAVENOUS

## 2014-08-11 MED ORDER — EPHEDRINE SULFATE 50 MG/ML IJ SOLN
INTRAMUSCULAR | Status: DC | PRN
  Administered 2014-08-11: 5 mg via INTRAVENOUS
  Administered 2014-08-11: 10 mg via INTRAVENOUS

## 2014-08-11 MED ORDER — LACTATED RINGERS IV SOLN
INTRAVENOUS | Status: DC | PRN
  Administered 2014-08-11 (×4): via INTRAVENOUS

## 2014-08-11 SURGICAL SUPPLY — 106 items
ADAPTER CARDIO PERF ANTE/RETRO (ADAPTER) ×3 IMPLANT
APPLICATOR COTTON TIP 6IN STRL (MISCELLANEOUS) ×3 IMPLANT
ATTRACTOMAT 16X20 MAGNETIC DRP (DRAPES) IMPLANT
BAG DECANTER FOR FLEXI CONT (MISCELLANEOUS) ×6 IMPLANT
BLADE OSCILLATING /SAGITTAL (BLADE) ×3 IMPLANT
BLADE STERNUM SYSTEM 6 (BLADE) ×3 IMPLANT
BLADE SURG 11 STRL SS (BLADE) ×12 IMPLANT
CANISTER SUCTION 2500CC (MISCELLANEOUS) ×3 IMPLANT
CANN PRFSN 3/8X14X24FR PCFC (MISCELLANEOUS)
CANN PRFSN 3/8XCNCT ST RT ANG (MISCELLANEOUS)
CANNULA EZ GLIDE AORTIC 21FR (CANNULA) ×3 IMPLANT
CANNULA FEM VENOUS REMOTE 22FR (CANNULA) ×3 IMPLANT
CANNULA FEMORAL ART 14 SM (MISCELLANEOUS) ×3 IMPLANT
CANNULA GUNDRY RCSP 15FR (MISCELLANEOUS) ×3 IMPLANT
CANNULA PRFSN 3/8X14X24FR PCFC (MISCELLANEOUS) IMPLANT
CANNULA PRFSN 3/8XCNCT RT ANG (MISCELLANEOUS) IMPLANT
CANNULA VEN MTL TIP RT (MISCELLANEOUS)
CATH THORACIC 36FR (CATHETERS) ×3 IMPLANT
CLIP FOGARTY SPRING 6M (CLIP) IMPLANT
CONN 1/2X1/2X1/2  BEN (MISCELLANEOUS)
CONN 1/2X1/2X1/2 BEN (MISCELLANEOUS) IMPLANT
CONN 3/8X1/2 ST GISH (MISCELLANEOUS) ×12 IMPLANT
CONN ST 1/4X3/8  BEN (MISCELLANEOUS) ×1
CONN ST 1/4X3/8 BEN (MISCELLANEOUS) ×2 IMPLANT
CONNECTOR 1/2X3/8X1/2 3 WAY (MISCELLANEOUS) ×1
CONNECTOR 1/2X3/8X1/2 3WAY (MISCELLANEOUS) ×2 IMPLANT
CONT SPEC 4OZ CLIKSEAL STRL BL (MISCELLANEOUS) ×9 IMPLANT
COVER PROBE W GEL 5X96 (DRAPES) ×3 IMPLANT
COVER SURGICAL LIGHT HANDLE (MISCELLANEOUS) IMPLANT
CRADLE DONUT ADULT HEAD (MISCELLANEOUS) ×3 IMPLANT
DEVICE SUT CK QUICK LOAD MINI (Prosthesis & Implant Heart) ×36 IMPLANT
DRAIN CHANNEL 32F RND 10.7 FF (WOUND CARE) ×9 IMPLANT
DRAPE CV SPLIT W-CLR ANES SCRN (DRAPES) ×3 IMPLANT
DRAPE INCISE IOBAN 66X45 STRL (DRAPES) ×9 IMPLANT
DRAPE PERI GROIN 82X75IN TIB (DRAPES) ×3 IMPLANT
DRAPE SLUSH/WARMER DISC (DRAPES) ×3 IMPLANT
DRSG COVADERM 4X14 (GAUZE/BANDAGES/DRESSINGS) ×3 IMPLANT
ELECT REM PT RETURN 9FT ADLT (ELECTROSURGICAL) ×6
ELECTRODE REM PT RTRN 9FT ADLT (ELECTROSURGICAL) ×4 IMPLANT
GAUZE SPONGE 4X4 12PLY STRL (GAUZE/BANDAGES/DRESSINGS) ×3 IMPLANT
GLOVE BIO SURGEON STRL SZ 6 (GLOVE) ×3 IMPLANT
GLOVE BIO SURGEON STRL SZ 6.5 (GLOVE) ×3 IMPLANT
GLOVE BIO SURGEON STRL SZ7 (GLOVE) ×3 IMPLANT
GLOVE BIOGEL PI IND STRL 6 (GLOVE) ×8 IMPLANT
GLOVE BIOGEL PI IND STRL 7.0 (GLOVE) ×8 IMPLANT
GLOVE BIOGEL PI INDICATOR 6 (GLOVE) ×4
GLOVE BIOGEL PI INDICATOR 7.0 (GLOVE) ×4
GLOVE ORTHO TXT STRL SZ7.5 (GLOVE) ×9 IMPLANT
GOWN STRL REUS W/ TWL LRG LVL3 (GOWN DISPOSABLE) ×12 IMPLANT
GOWN STRL REUS W/TWL LRG LVL3 (GOWN DISPOSABLE) ×6
HEMOSTAT POWDER SURGIFOAM 1G (HEMOSTASIS) ×6 IMPLANT
INSERT FOGARTY XLG (MISCELLANEOUS) ×3 IMPLANT
KIT BASIN OR (CUSTOM PROCEDURE TRAY) ×3 IMPLANT
KIT DEVICE SUT COR-KNOT MIS 5 (INSTRUMENTS) IMPLANT
KIT DILATOR VASC 18G NDL (KITS) ×3 IMPLANT
KIT DRAINAGE VACCUM ASSIST (KITS) ×3 IMPLANT
KIT ROOM TURNOVER OR (KITS) ×3 IMPLANT
KIT SUCTION CATH 14FR (SUCTIONS) ×9 IMPLANT
KIT SUT CK MINI COMBO 4X17 (Prosthesis & Implant Heart) ×36 IMPLANT
LINE VENT (MISCELLANEOUS) ×3 IMPLANT
NS IRRIG 1000ML POUR BTL (IV SOLUTION) ×21 IMPLANT
PACK OPEN HEART (CUSTOM PROCEDURE TRAY) ×3 IMPLANT
PAD ARMBOARD 7.5X6 YLW CONV (MISCELLANEOUS) ×6 IMPLANT
PAD ELECT DEFIB RADIOL ZOLL (MISCELLANEOUS) ×3 IMPLANT
SET CARDIOPLEGIA MPS 5001102 (MISCELLANEOUS) ×3 IMPLANT
SET IRRIG TUBING LAPAROSCOPIC (IRRIGATION / IRRIGATOR) ×3 IMPLANT
SOLUTION ANTI FOG 6CC (MISCELLANEOUS) ×3 IMPLANT
SPONGE LAP 18X18 X RAY DECT (DISPOSABLE) ×3 IMPLANT
SPONGE LAP 4X18 X RAY DECT (DISPOSABLE) ×6 IMPLANT
SUCKER INTRACARDIAC WEIGHTED (SUCKER) ×3 IMPLANT
SUT BONE WAX W31G (SUTURE) ×3 IMPLANT
SUT ETHIBON 2 0 V 52N 30 (SUTURE) ×6 IMPLANT
SUT ETHIBOND 2 0 SH (SUTURE) ×5 IMPLANT
SUT ETHIBOND 2 0 SH 36X2 (SUTURE) ×4 IMPLANT
SUT ETHIBOND 2 0 V4 (SUTURE) IMPLANT
SUT ETHIBOND 2 0V4 GREEN (SUTURE) IMPLANT
SUT ETHIBOND 4 0 TF (SUTURE) IMPLANT
SUT ETHIBOND NAB MH 2-0 36IN (SUTURE) ×3 IMPLANT
SUT ETHIBOND X763 2 0 SH 1 (SUTURE) ×9 IMPLANT
SUT MNCRL AB 3-0 PS2 18 (SUTURE) ×6 IMPLANT
SUT PDS AB 1 CTX 36 (SUTURE) ×6 IMPLANT
SUT PROLENE 3 0 SH 1 (SUTURE) ×3 IMPLANT
SUT PROLENE 3 0 SH DA (SUTURE) ×9 IMPLANT
SUT PROLENE 4 0 RB 1 (SUTURE) ×12
SUT PROLENE 4 0 SH DA (SUTURE) ×3 IMPLANT
SUT PROLENE 4-0 RB1 .5 CRCL 36 (SUTURE) ×24 IMPLANT
SUT PROLENE 5 0 C 1 36 (SUTURE) ×12 IMPLANT
SUT SILK  1 MH (SUTURE) ×6
SUT SILK 1 MH (SUTURE) ×12 IMPLANT
SUT SILK 2 0 SH CR/8 (SUTURE) ×3 IMPLANT
SUT STEEL 6MS V (SUTURE) IMPLANT
SUT STEEL STERNAL CCS#1 18IN (SUTURE) IMPLANT
SUT STEEL SZ 6 DBL 3X14 BALL (SUTURE) ×9 IMPLANT
SUT VIC AB 2-0 CTX 27 (SUTURE) IMPLANT
SWAB COLLECTION DEVICE MRSA (MISCELLANEOUS) ×3 IMPLANT
SYSTEM SAHARA CHEST DRAIN ATS (WOUND CARE) ×3 IMPLANT
TAPE CLOTH SURG 4X10 WHT LF (GAUZE/BANDAGES/DRESSINGS) ×3 IMPLANT
TOWEL OR 17X24 6PK STRL BLUE (TOWEL DISPOSABLE) ×6 IMPLANT
TOWEL OR 17X26 10 PK STRL BLUE (TOWEL DISPOSABLE) ×6 IMPLANT
TRAY FOLEY IC TEMP SENS 14FR (CATHETERS) IMPLANT
TUBE ANAEROBIC SPECIMEN COL (MISCELLANEOUS) ×3 IMPLANT
UNDERPAD 30X30 INCONTINENT (UNDERPADS AND DIAPERS) ×3 IMPLANT
VALVE MITRAL 31MM (Prosthesis & Implant Heart) ×3 IMPLANT
WATER STERILE IRR 1000ML POUR (IV SOLUTION) ×6 IMPLANT
WIRE BENTSON .035X145CM (WIRE) ×3 IMPLANT
YANKAUER SUCT BULB TIP NO VENT (SUCTIONS) ×3 IMPLANT

## 2014-08-11 NOTE — Progress Notes (Signed)
Placed pt on 40 and 4 to starting weaning

## 2014-08-11 NOTE — Progress Notes (Signed)
[facility] BRIEF SICU PROGRESS NOTE  Day of Surgery  S/P Procedure(s) (LRB): REDO MITRAL VALVE (MV) REPLACEMENT (N/A) TRANSESOPHAGEAL ECHOCARDIOGRAM (TEE) (N/A)   Starting to wake on vent AV paced w/ stable hemodynamics Chest tube output low, thin serosanguinous Labs okay except INR prolonged, receiving FFP  Plan: Continue routine early postop  David [name] 08/11/2014 8:16 PM

## 2014-08-11 NOTE — Anesthesia Procedure Notes (Signed)
Procedure Name: Intubation Date/Time: 08/11/2014 7:56 AM Performed by: Shirlyn Goltz Pre-anesthesia Checklist: Patient identified, Emergency Drugs available, Suction available and Patient being monitored Patient Re-evaluated:Patient Re-evaluated prior to inductionOxygen Delivery Method: Circle system utilized Preoxygenation: Pre-oxygenation with 100% oxygen Intubation Type: IV induction Ventilation: Mask ventilation without difficulty and Oral airway inserted - appropriate to patient size Laryngoscope Size: Mac and 4 Grade View: Grade I Tube type: Oral Tube size: 8.0 mm Number of attempts: 1 Airway Equipment and Method: Stylet Placement Confirmation: ETT inserted through vocal cords under direct vision,  positive ETCO2 and breath sounds checked- equal and bilateral Secured at: 22 cm Tube secured with: Tape Dental Injury: Teeth and Oropharynx as per pre-operative assessment

## 2014-08-11 NOTE — CV Procedure (Signed)
Intra-operative Transesophageal Echocardiography Report:   [name] is a 54 year old male with a history of endocarditis involving the mitral valve. He underwent mitral valve repair in 2009. Over the last 6-7 months he began to develop symptoms of fatigue and exertional shortness of breath. He was found to have severe hemolytic anemia and significant mitral regurgitation. He is now scheduled to undergo mitral valve repair or replacement by Dr. Roxy Manns. Intraoperative transesophageal echocardiography was requested to evaluate the mitral valve and to assist with the procedure, to assess for any other valvular pathology, and to serve as a monitor of right and left ventricular function.  The patient was brought to the operating room at [facility] and general anesthesia was induced without difficulty. Following endotracheal intubation and orogastric suctioning, the transesophageal echocardiography probe was inserted into the esophagus without difficulty.   Impression: Pre-bypass Findings:    1. Aortic valve: The aortic valve was normal-appearing. There were 3 leaflets which opened normally without restriction. There was no aortic insufficiency. There was no thickening or calcification of the leaflets noted.  2. Mitral valve: There was severe mitral regurgitation. There was an annuloplasty ring present. There appeared to be a perforation in the anterior leaflet. There was a severe jet of mitral regurgitation which was originating within the annuloplasty ring. This was directed posteriorly and tracted along the posterior wall of the left atrium. The vena contracta measured 0.82 cm. There are no vegetations noted on the valve leaflets. The annuloplasty ring appeared well-seated and there was no evidence of an abscess cavity or pocket adjacent to the valve.  3. Left ventricle: The left ventricular cavity was enlarged and there was  hyperdynamic left ventricular function. The ejection fraction was  estimated at 75%. Left ventricular end-diastolic diameter measured 5.58 cm. Left ventricular posterior wall thickness measured 0.88 cm in anterior wall thickness measured 0.98 cm at end diastole in the mid-papillary short axis view.  4. Right ventricle: There appeared to be moderate right ventricular dysfunction. The right ventricular cavity appeared enlarged. There was reduced  contractility of the right ventricular free wall. There was no septal flattening noted. TAPSE measured 11.3 mm which was consistent with reduced right ventricular function.  5. Tricuspid valve: The tricuspid leaflets appeared tract structurally intact. There was trace tricuspid insufficiency.  6. Pulmonic valve: The pulmonic leaflets were well visualized and there was trace pulmonic insufficiency.  7. Inter-atrial septum: The inter-atrial septum was intact without evidence of patent foramen ovale or atrial septal defect by color Doppler or bubble study.  8. Left atrium: The left atrial cavity was enlarged and measured 6.0 cm in the mediolateral dimension and 5.8 cm in the superior-inferior dimension. There was no there is no thrombus noted within the left atrium or left atrial appendage.  9.  Ascending aorta: There was a well defined aortic root and  sinotubular ridge without effacement or dilatation. There was no atheromatous disease or thickening within the walls  of the ascending aorta.   10. Descending aorta: The descending aorta appeared normal. There was no atheromatous disease noted within the walls. The diameter was 2.2 centimeters.  Post- bypass findings:   1. Aortic valve: The aortic valve was unchanged from the pre-bypass study and appeared normal.   2. Mitral valve: There was a mechanical bileaflet prosthesis in the mitral position. The leaflets opened normally. There was no perivalvular insufficiency. There was what appeared to be normal washing jets of mitral insufficiency consistent with a normally  functioning bileaflet mechanical prosthesis.  CW of the  mitral inflow revealed a mean transmitral gradient of 2 mmHg.  3. Left ventricle:  . Upon separation from cardiopulmonary bypass there was a significant amount of air within the LV cavity. The air was largely cleared from the LV with de-airing maneuvers and LV ejection prior to separation from CPB. Upon initial separation from CPB, there was interventricular septal flattening consistent with RV dysfunction. This resolved over the next 15-20 minutes. There was dyssynchrony of left ventricular contraction due to ventricular pacing but no regional wall motion abnormalities. The ejection fraction was estimated at 60%.   4. Right ventricle: The right ventricular cavity was dilated after separation from cardiopulmonary bypass. There appeared to be moderate to severe right ventricular dysfunction initially. This improved in the post-bypass period. The right ventricular dysfunction was graded as moderate at the end of the procedure.  5. Tricuspid valve: The tricuspid leaflets appeared normal and there was trace tricuspid insufficiency.  Roberts Gaudy

## 2014-08-11 NOTE — OR Nursing (Signed)
1450 first call made to SICU

## 2014-08-11 NOTE — Anesthesia Preprocedure Evaluation (Addendum)
Anesthesia Evaluation  Patient identified by MRN, date of birth, ID band Patient awake    Reviewed: Allergy & Precautions, NPO status , Patient's Chart, lab work & pertinent test results  Airway Mallampati: II  TM Distance: >3 FB Neck ROM: Full    Dental  (+) Teeth Intact   Pulmonary former smoker,    + decreased breath sounds      Cardiovascular Rhythm:Regular + Systolic murmurs    Neuro/Psych    GI/Hepatic   Endo/Other    Renal/GU      Musculoskeletal   Abdominal   Peds  Hematology   Anesthesia Other Findings   Reproductive/Obstetrics                           Anesthesia Physical Anesthesia Plan  ASA: IV  Anesthesia Plan: General   Post-op Pain Management:    Induction: Intravenous  Airway Management Planned: Oral ETT  Additional Equipment: Arterial line, PA Cath, CVP, 3D TEE and Ultrasound Guidance Line Placement  Intra-op Plan:   Post-operative Plan: Post-operative intubation/ventilation  Informed Consent: I have reviewed the patients History and Physical, chart, labs and discussed the procedure including the risks, benefits and alternatives for the proposed anesthesia with the patient or authorized representative who has indicated his/her understanding and acceptance.   History available from chart only  Plan Discussed with: CRNA and Anesthesiologist  Anesthesia Plan Comments:        Anesthesia Quick Evaluation

## 2014-08-11 NOTE — Progress Notes (Signed)
  Echocardiogram Echocardiogram Transesophageal has been performed.  David [name] 08/11/2014, 9:24 AM

## 2014-08-11 NOTE — Anesthesia Postprocedure Evaluation (Signed)
  Anesthesia Post-op Note  Patient: David [name]  Procedure(s) Performed: Procedure(s) with comments: REDO MITRAL VALVE (MV) REPLACEMENT (N/A) - NO NECK LINES ON RIGHT SIDE TRANSESOPHAGEAL ECHOCARDIOGRAM (TEE) (N/A)  Patient Location: SICU  Anesthesia Type:General  Level of Consciousness: sedated and Patient remains intubated per anesthesia plan  Airway and Oxygen Therapy: Patient remains intubated per anesthesia plan and Patient placed on Ventilator (see vital sign flow sheet for setting)  Post-op Pain: none  Post-op Assessment: Post-op Vital signs reviewed  Post-op Vital Signs: Reviewed  Last Vitals:  Filed Vitals:   08/11/14 2000  BP: 112/72  Pulse: 80  Temp: 38.2 C  Resp: 21    Complications: No apparent anesthesia complications

## 2014-08-11 NOTE — OR Nursing (Signed)
0.625% glutaraldehyde used to soak piece of pericardium.  Soaked for 3 min then soaked in 0.9%NaCl three times for 10 min each

## 2014-08-11 NOTE — Brief Op Note (Addendum)
08/09/2014 - 08/11/2014  1:21 PM  PATIENT:  David [name]  54 y.o. male  PRE-OPERATIVE DIAGNOSIS:  1. Recurrent Severe Mitral Valve Regurgitation 2. S/P MV repair via right mini thoracotomy 03/22/2008   POST-OPERATIVE DIAGNOSIS:  1. Recurrent Severe Mitral Valve Regurgitation 2. S/P MV repair via right mini thoracotomy 03/22/2008   PROCEDURE: TRANSESOPHAGEAL ECHOCARDIOGRAM (TEE), REDO MITRAL VALVE (MV) REPLACEMENT  - NO NECK LINES ON RIGHT SIDE- using a Sorin [employer] Opti form Mechanical Mitral Valve, size 31 mm  SURGEON:    Rexene Alberts, MD  ASSISTANTS:  Nani Skillern, PA-C  ANESTHESIA:   Roberts Gaudy, MD  CROSSCLAMP TIME:   138'  CARDIOPULMONARY BYPASS TIME: 236'  FINDINGS:  Large perforation of anterior leaflet of mitral valve within previous Cor-matrix  Primarily type I dysfunction with severe (4+) mitral regurgitation  Normal LV systolic function  Mild RV dysfunction  Mild residual mitral regurgitation after replacement of Cor-matrix patch using autologous pericardium  Normal mitral valve function after mitral valve replacement using a bileaflet mechanical valve  Mitral Valve Etiology  MV Insufficiency: Severe  MV Disease: Yes.  MV Stenosis: No mitral valve stenosis.  MV Disease Functional Class: MV Disease Functional Class: Type I.   Etiology (Choose at least one and up to five): Degenerative.  MV Lesions (Choose at least one):  Leaflet perforation, anterior.  Mitral/Tricuspid/Pulmonary Valve Procedure  Mitral Valve Procedure Performed:  Replacement after unsuccessful attempt at re-repair  Implant: Mechanical Valve: Implant model number F7-031, Size 31, Unique Device Identifier [id number].  Tricuspid Valve Procedure Performed:  N/A  Implant: N/A  Pulmonary Valve Procedure Performed: N/A     Implant: N/A  COMPLICATIONS: None  BASELINE WEIGHT: 69 kg  PATIENT DISPOSITION:   TO SICU IN STABLE CONDITION  Rexene Alberts 08/11/2014 3:31 PM

## 2014-08-11 NOTE — Op Note (Addendum)
CARDIOTHORACIC SURGERY OPERATIVE NOTE  Date of Procedure:  08/11/2014  Preoperative Diagnosis:   Severe Symptomatic Recurrent Primary Mitral Regurgitation s/p Mitral Valve Repair in 2009  Hemolytic Anemia  Postoperative Diagnosis: Same  Procedure:    Redo Mitral Valve Replacement  Sorin [employer] Optiform bileaflet mechanical prosthesis (size 71mm, catalog #F7-031, serial #[id number])  Surgeon: Valentina Gu. Roxy Manns, MD  Assistant: Nani Skillern, PA-C  Anesthesia: Roberts Gaudy, MD  Operative Findings:  Large perforation of anterior leaflet of mitral valve within previous Cor-matrix patch  Primarily type I dysfunction with severe (4+) mitral regurgitation  Normal LV systolic function  Mild RV dysfunction  Mild residual mitral regurgitation after attempted re-repair using replacement of Cor-matrix patch with autologous pericardium  Normal mitral valve function after mitral valve replacement using a bileaflet mechanical valve           BRIEF CLINICAL NOTE AND INDICATIONS FOR SURGERY  Patient is a 54 year old African-American male from [place] with history of mitral valve endocarditis in 2001 and recurrent endocarditis in 2778 complicated by congestive heart failure. He underwent mitral valve repair 03/22/2008 via right mini thoracotomy approach. Findings at the time of surgery were notable for the presence of a large perforation in the anterior leaflet with active vegetations and a flail segment of the posterior leaflet (P3) with multiple ruptured primary chordae tendineae. The patient recovered uneventfully and was discharged from the hospital on the fourth postoperative day. His subsequent convalescence was uneventful and follow-up echocardiogram performed 04/01/2008 revealed intact mitral valve repair with no residual mitral regurgitation. The patient completely recovered and returned to normal physical activity without any limitations. He was last seen in  follow-up in our office on 11/23/2008. The patient did very well until approximately 6 or 7 months ago when he first began to develop mild symptoms of fatigue. Symptoms gradually progressed and the patient began to experience some exertional shortness of breath and occasional pressure across his chest. He was seen by his primary care physician (Dr. [name]) in early February, and routine blood work revealed a hemoglobin of 5.9. He was admitted to the hospital 05/31/2014 and transfused 2 units packed red blood cells with increase in hemoglobin to 9.0. The patient was treated with IV proton X and gastroenterology was consulted. The patient's stool was Hemoccult negative. EGD was performed and revealed no sign of GI bleeding. Patient was noted to have had colonoscopy in 2013. The patient was discharged from the hospital 06/02/2014 and followed closely by Dr. [name]. He reported intermittent night sweats with recurrent exertional shortness breath and chest pressure. Follow-up blood work obtained 06/20/2014 revealed the patient's hemoglobin had dropped to again to 7.2. He was readmitted to the hospital 06/21/2014 and transfused another 2 units of PRBCs. He was seen in consultation by Dr. Burr Medico from hematology. His hemoglobin increased appropriately with transfusion and the patient was discharged home. Subsequent lab workup confirmed the patient had findings consistent with severe hemolysis. He was treated with a short course of high-dose prednisone for presumed autoimmune hemolytic anemia, but this has subsequently been stopped. Cardiology consultation was requested and the patient was seen in the office by Dr. Acie Fredrickson. He was noted have a systolic murmur on exam. Transesophageal echocardiogram was performed 07/19/2014 by Dr. Stanford Breed. This revealed the presence of severe mitral regurgitation. The patient was for surgical consultation and seen in consultation on 08/03/2014. The patient has been seen counseled at  length regarding the indications, risks and potential benefits of surgery.  All questions have been answered, and  the patient provides full informed consent for the operation as described.    DETAILS OF THE OPERATIVE PROCEDURE  Preparation:  The patient is brought to the operating room on the above mentioned date and central monitoring was established by the anesthesia team including placement of Swan-Ganz catheter and radial arterial line. The patient is placed in the supine position on the operating table.  Intravenous antibiotics are administered. General endotracheal anesthesia is induced uneventfully. A Foley catheter is placed.  Baseline transesophageal echocardiogram was performed.  Findings were notable for severe (4+) mitral regurgitation.  There appears to be a perforation in the middle of the anterior leaflet with wide-open mitral regurgitation. The severity of regurgitation is considerably worse than it appeared at the time of transesophageal echocardiogram performed 07/19/2014. There is hyperdynamic left ventricular systolic function. There is mild right ventricular chamber enlargement and mild right ventricular dysfunction. No other abnormalities are noted. Specifically, there are no signs of any vegetations or other findings to suggest active bacterial endocarditis.  The patient's chest, abdomen, both groins, and both lower extremities are prepared and draped in a sterile manner. A time out procedure is performed.   Surgical Approach:  A median sternotomy incision was performed and the pericardium is opened.   The pericardial space is free around the majority of the left ventricle. There are dense adhesions obliterating the pericardial space overlying the right atrium, the lateral aspect of the right ventricle, and the proximal ascending thoracic aorta. The ascending aorta is dissected away from associated structures.  A portion of the patient's pericardium is removed and tanned in  gluteraldehyde solution for possible later use during the procedure.   Extracorporeal Cardiopulmonary Bypass and Myocardial Protection:  The right internal jugular vein is cannulated using the Seldinger technique and a guidewire advanced into the right atrium.  The right common femoral vein is cannulated using the Seldinger technique and a guidewire advanced into the right atrium using TEE guidance.  The patient is heparinized systemically and the right common femoral vein cannulated using a 22 Fr long femoral venous cannula.  The right internal jugular vein is cannulated using a 14 French pediatric femoral venous cannula.  The ascending aorta is cannulated for cardiopulmonary bypass.  Adequate heparinization is verified.  The entire pre-bypass portion of the operation was notable for stable hemodynamics.  Cardiopulmonary bypass was begun.  Sharp dissection is utilized to free up the right atrium and the interatrial groove. Dissection is tedious due to the presence of dense adhesions obliterating the pericardial space in this region. The medial aspect of the right lung is dissected away. Care is taken to identify the location of the right phrenic nerve. Ultimately, the posterior reflection of the pericardium on the right side is identified and the space between the pericardium and the intra-atrial groove is dissected.  A retrograde cardioplegia cannula is placed through the right atrium into the coronary sinus.  An antegrade cardioplegia cannula is placed in the ascending aorta.   The operative field is continuously flooded with carbon dioxide.  The patient is cooled to 32C systemic temperature.  The aortic cross clamp is applied and cold blood cardioplegia is delivered initially in an antegrade fashion through the aortic root.  Supplemental cardioplegia is given retrograde through the coronary sinus catheter.  Iced saline slush is applied for topical hypothermia.  The initial cardioplegic arrest is  rapid with early diastolic arrest.  Repeat doses of cardioplegia are administered intermittently throughout the entire cross clamp portion of the operation through  the aortic root and through the coronary sinus catheter in order to maintain completely flat electrocardiogram and septal myocardial temperature below 15C.  Myocardial protection was felt to be excellent.   Mitral Valve Replacement:  A left atriotomy incision was performed through the interatrial groove and extended partially across the back wall of the left atrium after opening the oblique sinus inferiorly.  The mitral valve is exposed using a self-retaining retractor.  The mitral valve was inspected and notable for An intact mitral annuloplasty ring with an obvious large perforation in the midportion of the anterior leaflet. The perforation is somewhat longitudinally oriented and appears to be right through the middle of the Cor matrix (ECM) patch that was utilized to repair the anterior leaflet at the time of the patient's first operation. The perforation does not appear to extend outside of the patch into the surrounding leaflet tissue. The perforation does not appear to involve the suture line but rather seems to be right through the middle of the patch, which is thin and friable. There are no vegetations to suggest the presence of bacterial endocarditis.  The remainder of the previous repair appears intact. There is some restriction of the P3 segment of the posterior leaflet. There is no prolapse. The free margin of the anterior leaflet is somewhat thickened and fibrotic.  The old Cor matrix patch is excised with a small rim of adjacent mitral valve leaflet tissue. The excised patch and mitral valve leaflet tissue are sent to pathology with explicit instructions to make certain to examine all components for histology. Swab culture is sent and a portion of the tissue is sent for culture.  An attempt at repeat mitral valve repair is  performed using a patch of autologous pericardium to reconstruct the anterior leaflet. After trimming a portion of pericardium to an appropriate elliptical size and shape, the patch is sewn in place using running 4-0 Prolene suture. The valve was tested with saline and appears competent, although there remains mild residual mitral regurgitation. The residual regurgitation seems to stem from the presence of some restriction of the P3 segment of the posterior leaflet as well as the presence of significant sclerosis involving the free margin of the anterior leaflet. At this juncture further attempts to repair the valve are aborted.  The majority of the anterior leaflet of the mitral valve is removed along with the pericardial patch. Portions of the subvalvular apparatus from the anterior leaflet are preserved with primary chordae tendineae and a short segment of free margin of the anterior leaflet. Each are incorporated into the valve replacement suture line laterally. The posterior leaflet is split in the midline, thereby preserving portions of the subvalvular apparatus to both the anterior and the posterior leaflet.  The mitral annulus was sized to accept a 31 mm bileaflet mechanical prosthesis.  Mitral valve replacement is performed using interrupted 2-0 Ethibond horizontal mattress pledgeted sutures with pledgets in the supra-annular position.  A Sorin [employer] OptiForm bileaflet mechanical prosthesis (size 31 mm, catalog [id number], serial [id number]) is secured in place uneventfully. All valve sutures are secured using the Cor knot device.  The valve is again tested with saline and appears to function normally. There is no paravalvular leak.  Rewarming is begun.   Procedure Completion:  The atriotomy was closed using a 2-layer closure of running 3-0 Prolene suture after placing a sump drain across the mitral valve to serve as a left ventricular vent.  One final dose of warm retrograde "hot shot"  cardioplegia was  administered retrograde through the coronary sinus catheter while all air was evacuated through the aortic root.  The aortic cross clamp was removed after a total cross clamp time of 138 minutes.  Epicardial pacing wires are fixed to the right ventricular outflow tract and to the right atrial appendage. The patient is rewarmed to 37C temperature. The lungs are ventilated and the heart allowed to fill. There is considerable amount of air in the left ventricle and transient global left ventricular dysfunction consistent with intra-coronary air. The patient is allowed to rest on cardiopulmonary bypass while venting any residual air through the aortic root and the left ventricular vent. Ultimately the air clears.  The aortic and left ventricular vents are removed.  The patient is weaned and disconnected from cardiopulmonary bypass.  The patient's rhythm at separation from bypass was AV paced.  The patient was weaned from cardioplegic bypass on low dose milrinone and dopamine infusions. Total cardiopulmonary bypass time for the operation was 236 minutes.  Followup transesophageal echocardiogram performed after separation from bypass revealed a well-seated bileaflet mechanical mitral valve prosthesis that was functioning normally and without any paravalvular leak.  Left ventricular function was unchanged from preoperatively.  The aortic cannula was removed uneventfully. Protamine was administered to reverse the anticoagulation. The right internal jugular and femoral venous cannulae were removed and manual pressure held on each for 30 minutes.  The mediastinum and pleural space were inspected for hemostasis and irrigated with saline solution.  There was severe coagulopathy.  The patient received a total of 2 packs adult platelets and 2 units fresh frozen plasma due to coagulopathy and thrombocytopenia after separation from cardiopulmonary bypass and reversal of heparin with protamine.  The  mediastinum and both pleural spaces were drained using 4 chest tubes placed through separate stab incisions inferiorly.  The soft tissues anterior to the aorta were reapproximated loosely. The sternum is closed with double strength sternal wire. The soft tissues anterior to the sternum were closed in multiple layers and the skin is closed with a running subcuticular skin closure.  The post-bypass portion of the operation was notable for stable rhythm and hemodynamics.  The patient received 2 units packed red blood cells during the procedure due to anemia which was present preoperatively and exacerbated by acute blood loss and hemodilution during cardiopulmonary bypass.   Disposition:  The patient tolerated the procedure well.  The patient was transported to the surgical intensive care unit in stable condition. There were no intraoperative complications. All sponge instrument and needle counts are verified correct at completion of the operation.    Valentina Gu. Roxy Manns MD 08/11/2014 3:38 PM

## 2014-08-11 NOTE — Transfer of Care (Signed)
Immediate Anesthesia Transfer of Care Note  Patient: JAIVIAN BATTAGLINI  Procedure(s) Performed: Procedure(s) with comments: REDO MITRAL VALVE (MV) REPLACEMENT (N/A) - NO NECK LINES ON RIGHT SIDE TRANSESOPHAGEAL ECHOCARDIOGRAM (TEE) (N/A)  Patient Location: ICU  Anesthesia Type:General  Level of Consciousness: sedated and Patient remains intubated per anesthesia plan  Airway & Oxygen Therapy: Patient remains intubated per anesthesia plan and Patient placed on Ventilator (see vital sign flow sheet for setting)  Post-op Assessment: Report given to RN, Post -op Vital signs reviewed and stable, Patient moving all extremities and Patient moving all extremities X 4 -- pt vss during transport.  [place] arrival to ICU BP decreased but carrier fluid was not running.  Dr. Linna Caprice at bs to assess.  Once carrier fluid restarted, BP stable on all gtts, report given to RN   Post vital signs: Reviewed and stable  Last Vitals:  Filed Vitals:   08/11/14 0524  BP: 111/72  Pulse: 101  Temp: 37.1 C  Resp:     Complications: No apparent anesthesia complications

## 2014-08-12 ENCOUNTER — Inpatient Hospital Stay (HOSPITAL_COMMUNITY): Payer: 59

## 2014-08-12 DIAGNOSIS — Z954 Presence of other heart-valve replacement: Secondary | ICD-10-CM

## 2014-08-12 LAB — PREPARE FRESH FROZEN PLASMA
Unit division: 0
Unit division: 0
Unit division: 0
Unit division: 0
Unit division: 0

## 2014-08-12 LAB — POCT I-STAT 3, ART BLOOD GAS (G3+)
Acid-base deficit: 3 mmol/L — ABNORMAL HIGH (ref 0.0–2.0)
Acid-base deficit: 5 mmol/L — ABNORMAL HIGH (ref 0.0–2.0)
Bicarbonate: 21.6 mEq/L (ref 20.0–24.0)
Bicarbonate: 20 mEq/L (ref 20.0–24.0)
O2 Saturation: 93 %
O2 Saturation: 95 %
Patient temperature: 37.9
Patient temperature: 37.7
TCO2: 23 mmol/L (ref 0–100)
TCO2: 21 mmol/L (ref 0–100)
pCO2 arterial: 36.2 mmHg (ref 35.0–45.0)
pCO2 arterial: 36.2 mmHg (ref 35.0–45.0)
pH, Arterial: 7.387 (ref 7.350–7.450)
pH, Arterial: 7.353 (ref 7.350–7.450)
pO2, Arterial: 71 mmHg — ABNORMAL LOW (ref 80.0–100.0)
pO2, Arterial: 81 mmHg (ref 80.0–100.0)

## 2014-08-12 LAB — POCT I-STAT, CHEM 8
BUN: 15 mg/dL (ref 6–23)
Calcium, Ion: 1.2 mmol/L (ref 1.12–1.23)
Chloride: 101 mmol/L (ref 96–112)
Creatinine, Ser: 1 mg/dL (ref 0.50–1.35)
Glucose, Bld: 144 mg/dL — ABNORMAL HIGH (ref 70–99)
HCT: 30 % — ABNORMAL LOW (ref 39.0–52.0)
Hemoglobin: 10.2 g/dL — ABNORMAL LOW (ref 13.0–17.0)
Potassium: 4.5 mmol/L (ref 3.5–5.1)
Sodium: 136 mmol/L (ref 135–145)
TCO2: 21 mmol/L (ref 0–100)

## 2014-08-12 LAB — CREATININE, SERUM
Creatinine, Ser: 1.02 mg/dL (ref 0.50–1.35)
GFR calc Af Amer: >90 mL/min (ref >90)
GFR calc non Af Amer: 81 mL/min — ABNORMAL LOW (ref >90)

## 2014-08-12 LAB — BASIC METABOLIC PANEL
Anion gap: 9 (ref 5–15)
BUN: 9 mg/dL (ref 6–23)
CO2: 21 mmol/L (ref 19–32)
Calcium: 8.2 mg/dL — ABNORMAL LOW (ref 8.4–10.5)
Chloride: 108 mmol/L (ref 96–112)
Creatinine, Ser: 1.02 mg/dL (ref 0.50–1.35)
GFR calc Af Amer: >90 mL/min (ref >90)
GFR calc non Af Amer: 81 mL/min — ABNORMAL LOW (ref >90)
Glucose, Bld: 124 mg/dL — ABNORMAL HIGH (ref 70–99)
Potassium: 4.3 mmol/L (ref 3.5–5.1)
Sodium: 138 mmol/L (ref 135–145)

## 2014-08-12 LAB — CBC
HCT: 27.9 % — ABNORMAL LOW (ref 39.0–52.0)
HCT: 29.1 % — ABNORMAL LOW (ref 39.0–52.0)
Hemoglobin: 9.1 g/dL — ABNORMAL LOW (ref 13.0–17.0)
Hemoglobin: 9.6 g/dL — ABNORMAL LOW (ref 13.0–17.0)
MCH: 26.7 pg (ref 26.0–34.0)
MCH: 27.4 pg (ref 26.0–34.0)
MCHC: 32.6 g/dL (ref 30.0–36.0)
MCHC: 33 g/dL (ref 30.0–36.0)
MCV: 81.8 fL (ref 78.0–100.0)
MCV: 83.1 fL (ref 78.0–100.0)
Platelets: 190 10*3/uL (ref 150–400)
Platelets: 162 10*3/uL (ref 150–400)
RBC: 3.41 MIL/uL — ABNORMAL LOW (ref 4.22–5.81)
RBC: 3.5 MIL/uL — ABNORMAL LOW (ref 4.22–5.81)
RDW: 25.2 % — ABNORMAL HIGH (ref 11.5–15.5)
RDW: 25.7 % — ABNORMAL HIGH (ref 11.5–15.5)
WBC: 11.2 10*3/uL — ABNORMAL HIGH (ref 4.0–10.5)
WBC: 16.9 10*3/uL — ABNORMAL HIGH (ref 4.0–10.5)

## 2014-08-12 LAB — HEMOGLOBIN AND HEMATOCRIT, BLOOD
HCT: 26.6 % — ABNORMAL LOW (ref 39.0–52.0)
Hemoglobin: 8.6 g/dL — ABNORMAL LOW (ref 13.0–17.0)

## 2014-08-12 LAB — GLUCOSE, CAPILLARY
Glucose-Capillary: 113 mg/dL — ABNORMAL HIGH (ref 70–99)
Glucose-Capillary: 122 mg/dL — ABNORMAL HIGH (ref 70–99)
Glucose-Capillary: 135 mg/dL — ABNORMAL HIGH (ref 70–99)
Glucose-Capillary: 135 mg/dL — ABNORMAL HIGH (ref 70–99)
Glucose-Capillary: 150 mg/dL — ABNORMAL HIGH (ref 70–99)
Glucose-Capillary: 86 mg/dL (ref 70–99)

## 2014-08-12 LAB — PREPARE PLATELET PHERESIS
Unit division: 0
Unit division: 0

## 2014-08-12 LAB — MAGNESIUM
Magnesium: 2.1 mg/dL (ref 1.5–2.5)
Magnesium: 2 mg/dL (ref 1.5–2.5)

## 2014-08-12 MED ORDER — FUROSEMIDE 10 MG/ML IJ SOLN
20.0000 mg | Freq: Four times a day (QID) | INTRAMUSCULAR | Status: AC
  Administered 2014-08-12 (×3): 20 mg via INTRAVENOUS
  Filled 2014-08-12 (×3): qty 2

## 2014-08-12 MED ORDER — FOLIC ACID 1 MG PO TABS
1.0000 mg | ORAL_TABLET | Freq: Every day | ORAL | Status: DC
  Administered 2014-08-12 – 2014-08-17 (×6): 1 mg via ORAL
  Filled 2014-08-12 (×6): qty 1

## 2014-08-12 MED ORDER — INSULIN ASPART 100 UNIT/ML ~~LOC~~ SOLN
0.0000 [IU] | SUBCUTANEOUS | Status: DC
  Administered 2014-08-12 (×5): 2 [IU] via SUBCUTANEOUS

## 2014-08-12 MED ORDER — MILRINONE IN DEXTROSE 20 MG/100ML IV SOLN: 0.0000 ug/kg/min | INTRAVENOUS | Status: DC

## 2014-08-12 MED ORDER — CETYLPYRIDINIUM CHLORIDE 0.05 % MT LIQD
7.0000 mL | Freq: Two times a day (BID) | OROMUCOSAL | Status: DC
  Administered 2014-08-12: 7 mL via OROMUCOSAL

## 2014-08-12 MED FILL — Heparin Sodium (Porcine) Inj 1000 Unit/ML: INTRAMUSCULAR | Qty: 30 | Status: AC

## 2014-08-12 MED FILL — Magnesium Sulfate Inj 50%: INTRAMUSCULAR | Qty: 10 | Status: AC

## 2014-08-12 MED FILL — Potassium Chloride Inj 2 mEq/ML: INTRAVENOUS | Qty: 40 | Status: AC

## 2014-08-12 NOTE — Procedures (Signed)
Extubation Procedure Note  Patient Details:   Name: David [name] DOB: November 07, 1960 MRN: [id number]   Airway Documentation:  Airway 8 mm (Active)  Secured at (cm) 22 cm 08/11/2014 10:00 PM  Measured From Lips 08/11/2014 10:00 PM  Secured Location Right 08/11/2014 10:00 PM  Secured By Pink Tape 08/11/2014 10:00 PM    Evaluation  O2 sats:98% stable throughout Complications:no No apparent complications Patient did did tolerate procedure well. Bilateral Breath Sounds: Rhonchi Suctioning: Airway Yes  Pt Vital Capacity was 1.2 l  NIF -24 Pt extubated to 4 lpm tolerated well encouraged to cough.  Wyman Songster [facility] 08/12/2014, 12:49 AM

## 2014-08-12 NOTE — Addendum Note (Signed)
Addendum  created 08/12/14 1726 by Roberts Gaudy, MD   Modules edited: Notes Section   Notes Section:  File: [id number]; Pend: [id number]; Pend: [id number]

## 2014-08-12 NOTE — Progress Notes (Addendum)
[address]       [place] [address]             [phone]        CARDIOTHORACIC SURGERY PROGRESS NOTE   R1 Day Post-Op Procedure(s) (LRB): REDO MITRAL VALVE (MV) REPLACEMENT (N/A) TRANSESOPHAGEAL ECHOCARDIOGRAM (TEE) (N/A)  Subjective: Feels well except for soreness in chest.  Breathing comfortably  Objective: Vital signs: BP Readings from Last 1 Encounters:  08/12/14 107/55   Pulse Readings from Last 1 Encounters:  08/12/14 80   Resp Readings from Last 1 Encounters:  08/12/14 23   Temp Readings from Last 1 Encounters:  08/12/14 99.7 F (37.6 C) Core (Comment)    Hemodynamics: PAP: (34-52)/(22-31) 47/28 mmHg CO:  [4.4 L/min-5 L/min] 4.5 L/min CI:  [3.2 L/min/m2-3.6 L/min/m2] 3.3 L/min/m2  Physical Exam:  Rhythm:   Sinus - AAI pacing  Breath sounds: clear  Heart sounds:  RRR w/ mechanical sounds  Incisions:  Dressing dry, intact  Abdomen:  Soft, non-distended, non-tender  Extremities:  Warm, well-perfused  Chest tubes:  Low volume thin serosanguinous output, no air leak    Intake/Output from previous day: 04/14 0701 - 04/15 0700 In: 10282.4 [P.O.:120; I.V.:6502.4; Blood:2800; NG/GT:60; IV Piggyback:800] Out: 0998 [Urine:3310; Blood:1850; Chest Tube:1570] Intake/Output this shift: Total I/O In: -  Out: 255 [Urine:125; Chest Tube:130]  Lab Results:  CBC: Recent Labs  08/11/14 2312 08/12/14 0455  WBC 10.1 11.2*  HGB 9.4* 9.1*  HCT 27.7* 27.9*  PLT 171 190    BMET:  Recent Labs  08/10/14 1045  08/11/14 2308 08/11/14 2312 08/12/14 0455  NA 139  < > 140  --  138  K 4.4  < > 3.8  --  4.3  CL 110  < > 105  --  108  CO2 19  --   --   --  21  GLUCOSE 87  < > 103*  --  124*  BUN 11  < > 10  --  9  CREATININE 1.10  < > 0.90 1.00 1.02  CALCIUM 9.1  --   --   --  8.2*  < > = values in this interval not displayed.   CBG (last 3)   Recent Labs  08/11/14 2328 08/12/14 0032 08/12/14 0452  GLUCAP 96 86 113*     ABG    Component Value Date/Time   PHART 7.353 08/12/2014 0453   PCO2ART 36.2 08/12/2014 0453   PO2ART 81.0 08/12/2014 0453   HCO3 20.0 08/12/2014 0453   TCO2 21 08/12/2014 0453   ACIDBASEDEF 5.0* 08/12/2014 0453   O2SAT 95.0 08/12/2014 0453    CXR:  PORTABLE CHEST - 1 VIEW  COMPARISON: August 11, 2014  FINDINGS: Chest tubes and mediastinal drain remain. Nasogastric tube and endotracheal tube have been removed. Swan-Ganz catheter tip is at the origin of the left main pulmonary artery. Temporary pacemaker wires are attached to the right heart. No pneumothorax. There is increase in effusion on the right with increase in right lower lobe consolidation. There is a smaller effusion on the left with stable interstitial edema. Heart is upper normal in size. Patient is status post mitral valve replacement.  IMPRESSION: Increase in effusion on the right with right lower lobe consolidation, increased. Interstitial edema overall remain stable. Tube and catheter positions as described without pneumothorax.   Electronically Signed  By: Lowella Grip III M.D.  On: 08/12/2014 08:12        Assessment/Plan: S/P Procedure(s) (LRB):  REDO MITRAL VALVE (MV) REPLACEMENT (N/A) TRANSESOPHAGEAL ECHOCARDIOGRAM (TEE) (N/A)  Overall doing well POD1 Maintaining NSR - AAI paced rhythm w/ stable hemodynamics on low dose milrinone, dopamine and neo drips Expected post op acute blood loss anemia, mild Expected post op volume excess Increased opacity right lung field c/w likely loculated pleural effusion - patient had dense adhesions around right lung in OR Pre-op acute diastolic CHF Pre-op hemolytic anemia   Mobilize  Wean drips  Hold beta blockers for now  D/C swan  Reassess right effusion after mobilization - may need U/S guided thoracentesis at some point if fluid does not come out via existing right pleural tube.   Start coumadin slowly tomorrow  SCD's for DVT  prophylaxis - no pharmacologic anticoagulation due to risk of bleeding related to raw surface area associated with redo surgery  Rexene Alberts 08/12/2014 8:24 AM

## 2014-08-12 NOTE — Progress Notes (Signed)
      [address]       [place] [address]             [phone]      Resting comfortably  BP 105/61 mmHg  Pulse 80  Temp(Src) 99.3 F (37.4 C) (Oral)  Resp 23  Ht 5\' 10"  (1.778 m)  Wt 168 lb 3.4 oz (76.3 kg)  BMI 24.14 kg/m2  SpO2 96%   Intake/Output Summary (Last 24 hours) at 08/12/14 1832 Last data filed at 08/12/14 1800  Gross per 24 hour  Intake 3398.16 ml  Output   4085 ml  Net -686.84 ml   K= 4.5, creatinine= 1.0 Hct= 30  Doing well POD # 1  Steven C. Roxan Hockey, MD [facility] [phone]

## 2014-08-12 NOTE — Progress Notes (Signed)
Anesthesilogy Follow-up:  Awake and alert, neuro intact, sitting in chair, having some incisional discomfort, "breathing is easier since before surgery".  VS: T-37.4 BP- 106/61 HR-89 (atrial pacing) RR-20 O2 Sat 97% on 2L  Labs: K-4.5 glucose -105 BUN/Cr.- 15/1.00 H/H- 10.2/30.0 platelets-162,000  Extubated 8 hours post-op.  54 year old male one day S/P MVR with mechanical bileaflet prosthesis for severe MR following MV repair in 2009 for endocarditis. Making good progress  Roberts Gaudy, MD

## 2014-08-13 ENCOUNTER — Encounter (HOSPITAL_COMMUNITY): Payer: Self-pay | Admitting: Thoracic Surgery (Cardiothoracic Vascular Surgery)

## 2014-08-13 ENCOUNTER — Inpatient Hospital Stay (HOSPITAL_COMMUNITY): Payer: 59

## 2014-08-13 LAB — TYPE AND SCREEN
ABO/RH(D): O POS
Antibody Screen: NEGATIVE
Unit division: 0
Unit division: 0
Unit division: 0
Unit division: 0
Unit division: 0
Unit division: 0

## 2014-08-13 LAB — BASIC METABOLIC PANEL
Anion gap: 11 (ref 5–15)
BUN: 13 mg/dL (ref 6–23)
CO2: 23 mmol/L (ref 19–32)
Calcium: 8.1 mg/dL — ABNORMAL LOW (ref 8.4–10.5)
Chloride: 101 mmol/L (ref 96–112)
Creatinine, Ser: 1.06 mg/dL (ref 0.50–1.35)
GFR calc Af Amer: >90 mL/min (ref >90)
GFR calc non Af Amer: 78 mL/min — ABNORMAL LOW (ref >90)
Glucose, Bld: 118 mg/dL — ABNORMAL HIGH (ref 70–99)
Potassium: 4 mmol/L (ref 3.5–5.1)
Sodium: 135 mmol/L (ref 135–145)

## 2014-08-13 LAB — GLUCOSE, CAPILLARY
Glucose-Capillary: 105 mg/dL — ABNORMAL HIGH (ref 70–99)
Glucose-Capillary: 108 mg/dL — ABNORMAL HIGH (ref 70–99)
Glucose-Capillary: 128 mg/dL — ABNORMAL HIGH (ref 70–99)
Glucose-Capillary: 130 mg/dL — ABNORMAL HIGH (ref 70–99)
Glucose-Capillary: 131 mg/dL — ABNORMAL HIGH (ref 70–99)
Glucose-Capillary: 99 mg/dL (ref 70–99)

## 2014-08-13 LAB — CBC
HCT: 27.1 % — ABNORMAL LOW (ref 39.0–52.0)
Hemoglobin: 8.7 g/dL — ABNORMAL LOW (ref 13.0–17.0)
MCH: 27.4 pg (ref 26.0–34.0)
MCHC: 32.1 g/dL (ref 30.0–36.0)
MCV: 85.5 fL (ref 78.0–100.0)
Platelets: 136 10*3/uL — ABNORMAL LOW (ref 150–400)
RBC: 3.17 MIL/uL — ABNORMAL LOW (ref 4.22–5.81)
RDW: 25 % — ABNORMAL HIGH (ref 11.5–15.5)
WBC: 15.5 10*3/uL — ABNORMAL HIGH (ref 4.0–10.5)

## 2014-08-13 LAB — WOUND CULTURE: Culture: NO GROWTH

## 2014-08-13 LAB — PROTIME-INR
INR: 2.01 — ABNORMAL HIGH (ref 0.00–1.49)
Prothrombin Time: 23 seconds — ABNORMAL HIGH (ref 11.6–15.2)

## 2014-08-13 MED ORDER — FUROSEMIDE 40 MG PO TABS
40.0000 mg | ORAL_TABLET | Freq: Every day | ORAL | Status: DC
  Administered 2014-08-13 – 2014-08-17 (×5): 40 mg via ORAL
  Filled 2014-08-13 (×5): qty 1

## 2014-08-13 MED ORDER — MORPHINE SULFATE 2 MG/ML IJ SOLN: 2.0000 mg | INTRAMUSCULAR | Status: DC | PRN

## 2014-08-13 MED ORDER — WARFARIN SODIUM 2.5 MG PO TABS
2.5000 mg | ORAL_TABLET | Freq: Every day | ORAL | Status: DC
  Filled 2014-08-13: qty 1

## 2014-08-13 MED ORDER — POTASSIUM CHLORIDE CRYS ER 20 MEQ PO TBCR
20.0000 meq | EXTENDED_RELEASE_TABLET | Freq: Two times a day (BID) | ORAL | Status: DC
  Administered 2014-08-13 (×2): 20 meq via ORAL
  Filled 2014-08-13 (×4): qty 1

## 2014-08-13 MED ORDER — INSULIN ASPART 100 UNIT/ML ~~LOC~~ SOLN: 0.0000 [IU] | Freq: Three times a day (TID) | SUBCUTANEOUS | Status: DC

## 2014-08-13 MED ORDER — WARFARIN SODIUM 1 MG PO TABS
1.0000 mg | ORAL_TABLET | Freq: Every day | ORAL | Status: DC
  Administered 2014-08-13: 1 mg via ORAL
  Filled 2014-08-13 (×2): qty 1

## 2014-08-13 MED ORDER — WARFARIN - PHYSICIAN DOSING INPATIENT
Freq: Every day | Status: DC
  Administered 2014-08-13 – 2014-08-15 (×2)

## 2014-08-13 NOTE — Plan of Care (Signed)
Problem: Phase II - Intermediate Post-Op Goal: Maintain Hemodynamic Stability Outcome: Progressing Remains AAI paced and on Dopamine Goal: Advance Diet Outcome: Completed/Met Date Met:  08/13/14 Heart Healthy diet for breakfast 4/16 Goal: Activity Progressed Outcome: Completed/Met Date Met:  08/13/14 Ambulating

## 2014-08-13 NOTE — Progress Notes (Signed)
      [address]       [place] [address]             [phone]      Up in chair  "feeling good"  BP 125/63 mmHg  Pulse 80  Temp(Src) 98 F (36.7 C) (Oral)  Resp 20  Ht 5\' 10"  (1.778 m)  Wt 168 lb 3.4 oz (76.3 kg)  BMI 24.14 kg/m2  SpO2 94%   Intake/Output Summary (Last 24 hours) at 08/13/14 1851 Last data filed at 08/13/14 1800  Gross per 24 hour  Intake 894.68 ml  Output   1425 ml  Net -530.32 ml   CBG well controlled  Off dopamine  Remo Lipps C. Roxan Hockey, MD [facility] [phone]

## 2014-08-13 NOTE — Plan of Care (Signed)
Problem: Phase II - Intermediate Post-Op Goal: Maintain Hemodynamic Stability Outcome: Progressing Weaning dopamine to off.

## 2014-08-13 NOTE — Progress Notes (Signed)
2 Days Post-Op Procedure(s) (LRB): REDO MITRAL VALVE (MV) REPLACEMENT (N/A) TRANSESOPHAGEAL ECHOCARDIOGRAM (TEE) (N/A) Subjective: No complaints this AM  Objective: Vital signs in last 24 hours: Temp:  [98.6 F (37 C)-100.2 F (37.9 C)] 99.4 F (37.4 C) (04/16 0400) Pulse Rate:  [79-81] 80 (04/16 0700) Cardiac Rhythm:  [-] Atrial paced (04/16 0400) Resp:  [13-27] 18 (04/16 0700) BP: (94-117)/(56-75) 106/62 mmHg (04/16 0700) SpO2:  [94 %-100 %] 96 % (04/16 0700) Arterial Line BP: (89-147)/(41-71) 96/41 mmHg (04/16 0700) Weight:  [168 lb 3.4 oz (76.3 kg)] 168 lb 3.4 oz (76.3 kg) (04/16 0500)  Hemodynamic parameters for last 24 hours: PAP: (41-48)/(19-23) 45/21 mmHg CO:  [4.5 L/min-4.7 L/min] 4.7 L/min CI:  [3.2 L/min/m2-3.4 L/min/m2] 3.4 L/min/m2  Intake/Output from previous day: 04/15 0701 - 04/16 0700 In: 1784 [P.O.:360; I.V.:1274; IV Piggyback:150] Out: 2440 [Urine:1820; Chest Tube:620] Intake/Output this shift:    General appearance: alert and no distress Neurologic: intact Heart: regular rate and rhythm Lungs: diminished breath sounds bibasilar Abdomen: normal findings: soft, non-tender  Lab Results:  Recent Labs  08/12/14 1630 08/12/14 1641 08/13/14 0400  WBC 16.9*  --  15.5*  HGB 9.6* 10.2* 8.7*  HCT 29.1* 30.0* 27.1*  PLT 162  --  136*   BMET:  Recent Labs  08/12/14 0455  08/12/14 1641 08/13/14 0400  NA 138  --  136 135  K 4.3  --  4.5 4.0  CL 108  --  101 101  CO2 21  --   --  23  GLUCOSE 124*  --  144* 118*  BUN 9  --  15 13  CREATININE 1.02  < > 1.00 1.06  CALCIUM 8.2*  --   --  8.1*  < > = values in this interval not displayed.  PT/INR:  Recent Labs  08/13/14 0400  LABPROT 23.0*  INR 2.01*   ABG    Component Value Date/Time   PHART 7.353 08/12/2014 0453   HCO3 20.0 08/12/2014 0453   TCO2 21 08/12/2014 1641   ACIDBASEDEF 5.0* 08/12/2014 0453   O2SAT 95.0 08/12/2014 0453   CBG (last 3)   Recent Labs  08/12/14 1928  08/12/14 2347 08/13/14 0400  GLUCAP 135* 130* 99    Assessment/Plan: S/P Procedure(s) (LRB): REDO MITRAL VALVE (MV) REPLACEMENT (N/A) TRANSESOPHAGEAL ECHOCARDIOGRAM (TEE) (N/A) -  CV- stable, atrial paced. Will wean dopamine today  RESP- decreased R pleural effusion  Dc MT, leave pleural tubes one more day  RENAL- lytes and creatinine OK- continue diuresis with PO lasix  ENDO- CBG well controlled  Anticoag- INR 2.01- will give 1 mg of warfarin today to avoid overshooting   LOS: 4 days    Melrose Nakayama 08/13/2014

## 2014-08-14 ENCOUNTER — Inpatient Hospital Stay (HOSPITAL_COMMUNITY): Payer: 59

## 2014-08-14 LAB — TISSUE CULTURE
Culture: NO GROWTH
Gram Stain: NONE SEEN

## 2014-08-14 LAB — CBC
HCT: 24.7 % — ABNORMAL LOW (ref 39.0–52.0)
Hemoglobin: 8 g/dL — ABNORMAL LOW (ref 13.0–17.0)
MCH: 27.3 pg (ref 26.0–34.0)
MCHC: 32.4 g/dL (ref 30.0–36.0)
MCV: 84.3 fL (ref 78.0–100.0)
Platelets: 152 10*3/uL (ref 150–400)
RBC: 2.93 MIL/uL — ABNORMAL LOW (ref 4.22–5.81)
RDW: 23.6 % — ABNORMAL HIGH (ref 11.5–15.5)
WBC: 11.2 10*3/uL — ABNORMAL HIGH (ref 4.0–10.5)

## 2014-08-14 LAB — BASIC METABOLIC PANEL
Anion gap: 9 (ref 5–15)
BUN: 13 mg/dL (ref 6–23)
CO2: 24 mmol/L (ref 19–32)
Calcium: 7.9 mg/dL — ABNORMAL LOW (ref 8.4–10.5)
Chloride: 97 mmol/L (ref 96–112)
Creatinine, Ser: 0.98 mg/dL (ref 0.50–1.35)
GFR calc Af Amer: >90 mL/min (ref >90)
GFR calc non Af Amer: >90 mL/min (ref >90)
Glucose, Bld: 105 mg/dL — ABNORMAL HIGH (ref 70–99)
Potassium: 3.7 mmol/L (ref 3.5–5.1)
Sodium: 130 mmol/L — ABNORMAL LOW (ref 135–145)

## 2014-08-14 LAB — GLUCOSE, CAPILLARY
Glucose-Capillary: 123 mg/dL — ABNORMAL HIGH (ref 70–99)
Glucose-Capillary: 108 mg/dL — ABNORMAL HIGH (ref 70–99)
Glucose-Capillary: 98 mg/dL (ref 70–99)
Glucose-Capillary: 115 mg/dL — ABNORMAL HIGH (ref 70–99)

## 2014-08-14 LAB — PROTIME-INR
INR: 1.71 — ABNORMAL HIGH (ref 0.00–1.49)
Prothrombin Time: 20.2 seconds — ABNORMAL HIGH (ref 11.6–15.2)

## 2014-08-14 MED ORDER — ASPIRIN EC 81 MG PO TBEC
81.0000 mg | DELAYED_RELEASE_TABLET | Freq: Every day | ORAL | Status: DC
  Administered 2014-08-15 – 2014-08-17 (×3): 81 mg via ORAL
  Filled 2014-08-14 (×3): qty 1

## 2014-08-14 MED ORDER — MOVING RIGHT ALONG BOOK
Freq: Once | Status: AC
  Administered 2014-08-14: 14:00:00
  Filled 2014-08-14: qty 1

## 2014-08-14 MED ORDER — POTASSIUM CHLORIDE CRYS ER 20 MEQ PO TBCR
20.0000 meq | EXTENDED_RELEASE_TABLET | Freq: Two times a day (BID) | ORAL | Status: DC
  Filled 2014-08-14 (×2): qty 1

## 2014-08-14 MED ORDER — WARFARIN SODIUM 2.5 MG PO TABS
2.5000 mg | ORAL_TABLET | Freq: Every day | ORAL | Status: DC
  Administered 2014-08-14: 2.5 mg via ORAL
  Filled 2014-08-14 (×2): qty 1

## 2014-08-14 MED ORDER — GUAIFENESIN-DM 100-10 MG/5ML PO SYRP
15.0000 mL | ORAL_SOLUTION | ORAL | Status: DC | PRN
Start: 2014-08-14 — End: 2014-08-17

## 2014-08-14 MED ORDER — MAGNESIUM HYDROXIDE 400 MG/5ML PO SUSP: 30.0000 mL | Freq: Every day | ORAL | Status: DC | PRN

## 2014-08-14 MED ORDER — SODIUM CHLORIDE 0.9 % IJ SOLN: 3.0000 mL | INTRAMUSCULAR | Status: DC | PRN

## 2014-08-14 MED ORDER — SODIUM CHLORIDE 0.9 % IV SOLN: 250.0000 mL | INTRAVENOUS | Status: DC | PRN

## 2014-08-14 MED ORDER — ZOLPIDEM TARTRATE 5 MG PO TABS: 5.0000 mg | ORAL_TABLET | Freq: Every evening | ORAL | Status: DC | PRN

## 2014-08-14 MED ORDER — LORATADINE 10 MG PO TABS
10.0000 mg | ORAL_TABLET | Freq: Every day | ORAL | Status: DC | PRN
  Filled 2014-08-14: qty 1

## 2014-08-14 MED ORDER — ALUM & MAG HYDROXIDE-SIMETH 200-200-20 MG/5ML PO SUSP: 15.0000 mL | ORAL | Status: DC | PRN

## 2014-08-14 MED ORDER — POTASSIUM CHLORIDE CRYS ER 20 MEQ PO TBCR
40.0000 meq | EXTENDED_RELEASE_TABLET | Freq: Two times a day (BID) | ORAL | Status: AC
  Administered 2014-08-14 (×2): 40 meq via ORAL
  Filled 2014-08-14: qty 2

## 2014-08-14 MED ORDER — ALPRAZOLAM 0.25 MG PO TABS: 0.2500 mg | ORAL_TABLET | Freq: Four times a day (QID) | ORAL | Status: DC | PRN

## 2014-08-14 MED ORDER — SODIUM CHLORIDE 0.9 % IJ SOLN
3.0000 mL | Freq: Two times a day (BID) | INTRAMUSCULAR | Status: DC
  Administered 2014-08-15 – 2014-08-16 (×3): 3 mL via INTRAVENOUS

## 2014-08-14 NOTE — Progress Notes (Addendum)
Patient experienced small nose bled from left nare. Occasionally has nose bleeds at home. Nose bleed has stopped. It lasted approximately 10 minutes. Not a heavy bleed.Dr Roxan Hockey notified at 1314.

## 2014-08-14 NOTE — Progress Notes (Signed)
3 Days Post-Op Procedure(s) (LRB): REDO MITRAL VALVE (MV) REPLACEMENT (N/A) TRANSESOPHAGEAL ECHOCARDIOGRAM (TEE) (N/A) Subjective: No complaints this AM  Objective: Vital signs in last 24 hours: Temp:  [98 F (36.7 C)-99.5 F (37.5 C)] 98 F (36.7 C) (04/17 0745) Pulse Rate:  [77-80] 79 (04/17 0700) Cardiac Rhythm:  [-] Atrial paced (04/17 0749) Resp:  [12-24] 22 (04/17 0700) BP: (96-126)/(52-67) 119/65 mmHg (04/17 0700) SpO2:  [89 %-99 %] 93 % (04/17 0700) Arterial Line BP: (80-146)/(39-77) 146/77 mmHg (04/16 1300) Weight:  [170 lb 10.2 oz (77.4 kg)] 170 lb 10.2 oz (77.4 kg) (04/17 0630)  Hemodynamic parameters for last 24 hours:    Intake/Output from previous day: 04/16 0701 - 04/17 0700 In: 954 [P.O.:480; I.V.:474] Out: 1010 [Urine:570; Chest Tube:440] Intake/Output this shift: Total I/O In: -  Out: 40 [Chest Tube:40]  General appearance: alert, cooperative and no distress Neurologic: intact Heart: regular rate and rhythm Lungs: diminished breath sounds bibasilar R>L  Lab Results:  Recent Labs  08/13/14 0400 08/14/14 0507  WBC 15.5* 11.2*  HGB 8.7* 8.0*  HCT 27.1* 24.7*  PLT 136* 152   BMET:  Recent Labs  08/13/14 0400 08/14/14 0507  NA 135 130*  K 4.0 3.7  CL 101 97  CO2 23 24  GLUCOSE 118* 105*  BUN 13 13  CREATININE 1.06 0.98  CALCIUM 8.1* 7.9*    PT/INR:  Recent Labs  08/14/14 0507  LABPROT 20.2*  INR 1.71*   ABG    Component Value Date/Time   PHART 7.353 08/12/2014 0453   HCO3 20.0 08/12/2014 0453   TCO2 21 08/12/2014 1641   ACIDBASEDEF 5.0* 08/12/2014 0453   O2SAT 95.0 08/12/2014 0453   CBG (last 3)   Recent Labs  08/13/14 1653 08/13/14 1944 08/13/14 2214  GLUCAP 123* 131* 128*    Assessment/Plan: S/P Procedure(s) (LRB): REDO MITRAL VALVE (MV) REPLACEMENT (N/A) TRANSESOPHAGEAL ECHOCARDIOGRAM (TEE) (N/A) Doing well    CV- in SR in the 70s- change pacer to backup  Coumadin for mechanical valve  RESP- right basilar  atelectasis +/- effusion, continue IS, keep Right pleural tube one more day  RENAL- creatinine normal, continue diuresis with PO lasix  ENDO- CBG well controlled  Anemia mild, follow  Thrombocytopenia- mild, resolved  transfer to [place]   LOS: 5 days    Melrose Nakayama 08/14/2014

## 2014-08-15 ENCOUNTER — Ambulatory Visit: Payer: 59 | Admitting: Cardiology

## 2014-08-15 ENCOUNTER — Inpatient Hospital Stay (HOSPITAL_COMMUNITY): Payer: 59

## 2014-08-15 LAB — PROTIME-INR
INR: 1.51 — ABNORMAL HIGH (ref 0.00–1.49)
Prothrombin Time: 18.4 seconds — ABNORMAL HIGH (ref 11.6–15.2)

## 2014-08-15 LAB — CBC
HCT: 24 % — ABNORMAL LOW (ref 39.0–52.0)
Hemoglobin: 7.9 g/dL — ABNORMAL LOW (ref 13.0–17.0)
MCH: 27.3 pg (ref 26.0–34.0)
MCHC: 32.9 g/dL (ref 30.0–36.0)
MCV: 83 fL (ref 78.0–100.0)
Platelets: 161 10*3/uL (ref 150–400)
RBC: 2.89 MIL/uL — ABNORMAL LOW (ref 4.22–5.81)
RDW: 22.4 % — ABNORMAL HIGH (ref 11.5–15.5)
WBC: 8.4 10*3/uL (ref 4.0–10.5)

## 2014-08-15 LAB — BASIC METABOLIC PANEL
Anion gap: 9 (ref 5–15)
BUN: 11 mg/dL (ref 6–23)
CO2: 23 mmol/L (ref 19–32)
Calcium: 8 mg/dL — ABNORMAL LOW (ref 8.4–10.5)
Chloride: 101 mmol/L (ref 96–112)
Creatinine, Ser: 0.83 mg/dL (ref 0.50–1.35)
GFR calc Af Amer: >90 mL/min (ref >90)
GFR calc non Af Amer: >90 mL/min (ref >90)
Glucose, Bld: 110 mg/dL — ABNORMAL HIGH (ref 70–99)
Potassium: 3.7 mmol/L (ref 3.5–5.1)
Sodium: 133 mmol/L — ABNORMAL LOW (ref 135–145)

## 2014-08-15 LAB — GLUCOSE, CAPILLARY
Glucose-Capillary: 129 mg/dL — ABNORMAL HIGH (ref 70–99)
Glucose-Capillary: 111 mg/dL — ABNORMAL HIGH (ref 70–99)
Glucose-Capillary: 86 mg/dL (ref 70–99)
Glucose-Capillary: 113 mg/dL — ABNORMAL HIGH (ref 70–99)
Glucose-Capillary: 118 mg/dL — ABNORMAL HIGH (ref 70–99)

## 2014-08-15 MED ORDER — POTASSIUM CHLORIDE CRYS ER 20 MEQ PO TBCR
40.0000 meq | EXTENDED_RELEASE_TABLET | Freq: Once | ORAL | Status: AC
  Administered 2014-08-15: 40 meq via ORAL
  Filled 2014-08-15: qty 2

## 2014-08-15 MED ORDER — POTASSIUM CHLORIDE CRYS ER 20 MEQ PO TBCR
20.0000 meq | EXTENDED_RELEASE_TABLET | Freq: Every day | ORAL | Status: DC
  Administered 2014-08-15 – 2014-08-17 (×3): 20 meq via ORAL
  Filled 2014-08-15 (×4): qty 1

## 2014-08-15 MED ORDER — WARFARIN SODIUM 5 MG PO TABS
5.0000 mg | ORAL_TABLET | Freq: Every day | ORAL | Status: DC
  Administered 2014-08-15 – 2014-08-16 (×2): 5 mg via ORAL
  Filled 2014-08-15 (×3): qty 1

## 2014-08-15 MED FILL — Heparin Sodium (Porcine) Inj 1000 Unit/ML: INTRAMUSCULAR | Qty: 30 | Status: AC

## 2014-08-15 MED FILL — Mannitol IV Soln 20%: INTRAVENOUS | Qty: 500 | Status: AC

## 2014-08-15 MED FILL — Lidocaine HCl IV Inj 20 MG/ML: INTRAVENOUS | Qty: 5 | Status: AC

## 2014-08-15 MED FILL — Sodium Bicarbonate IV Soln 8.4%: INTRAVENOUS | Qty: 50 | Status: AC

## 2014-08-15 MED FILL — Electrolyte-R (PH 7.4) Solution: INTRAVENOUS | Qty: 4000 | Status: AC

## 2014-08-15 MED FILL — Sodium Chloride IV Soln 0.9%: INTRAVENOUS | Qty: 3000 | Status: AC

## 2014-08-15 NOTE — Progress Notes (Signed)
CARDIAC REHAB PHASE I   PRE:  Rate/Rhythm: 71 SR  BP:  Supine:   Sitting: 120/50  Standing:    SaO2: 93%RA  MODE:  Ambulation: 550 ft   POST:  Rate/Rhythm: 81  BP:  Supine:   Sitting: 130/70  Standing:    SaO2: 95%RA 1015-1045 Pt walked 550 ft on RA with rolling walker and minimal asst. Tolerated well. To recliner after walk. Gait steady. Pacer and chest tube intact.   Graylon Good, RN BSN  08/15/2014 10:42 AM

## 2014-08-15 NOTE — Discharge Instructions (Addendum)
Information on my medicine - Coumadin   (Warfarin)  This medication education was reviewed with me or my healthcare representative as part of my discharge preparation.  The pharmacist that spoke with me during my hospital stay was:  Jaquita Folds, [facility]  Why was Coumadin prescribed for you? Coumadin was prescribed for you because you have a blood clot or a medical condition that can cause an increased risk of forming blood clots. Blood clots can cause serious health problems by blocking the flow of blood to the heart, lung, or brain. Coumadin can prevent harmful blood clots from forming. As a reminder your indication for Coumadin is:   Blood Clot Prevention After Heart Valve Surgery  What test will check on my response to Coumadin? While on Coumadin (warfarin) you will need to have an INR test regularly to ensure that your dose is keeping you in the desired range. The INR (international normalized ratio) number is calculated from the result of the laboratory test called prothrombin time (PT).  If an INR APPOINTMENT HAS NOT ALREADY BEEN MADE FOR YOU please schedule an appointment to have this lab work done by your health care provider within 7 days. Your INR goal is usually a number between:  2 to 3 or your provider may give you a more narrow range like 2-2.5.  Ask your health care provider during an office visit what your goal INR is.  What  do you need to  know  About  COUMADIN? Take Coumadin (warfarin) exactly as prescribed by your healthcare provider about the same time each day.  DO NOT stop taking without talking to the doctor who prescribed the medication.  Stopping without other blood clot prevention medication to take the place of Coumadin may increase your risk of developing a new clot or stroke.  Get refills before you run out.  What do you do if you miss a dose? If you miss a dose, take it as soon as you remember on the same day then continue your regularly scheduled regimen the next  day.  Do not take two doses of Coumadin at the same time.  Important Safety Information A possible side effect of Coumadin (Warfarin) is an increased risk of bleeding. You should call your healthcare provider right away if you experience any of the following: ? Bleeding from an injury or your nose that does not stop. ? Unusual colored urine (red or dark brown) or unusual colored stools (red or black). ? Unusual bruising for unknown reasons. ? A serious fall or if you hit your head (even if there is no bleeding).  Some foods or medicines interact with Coumadin (warfarin) and might alter your response to warfarin. To help avoid this: ? Eat a balanced diet, maintaining a consistent amount of Vitamin K. ? Notify your provider about major diet changes you plan to make. ? Avoid alcohol or limit your intake to 1 drink for women and 2 drinks for men per day. (1 drink is 5 oz. wine, 12 oz. beer, or 1.5 oz. liquor.)  Make sure that ANY health care provider who prescribes medication for you knows that you are taking Coumadin (warfarin).  Also make sure the healthcare provider who is monitoring your Coumadin knows when you have started a new medication including herbals and non-prescription products.  Coumadin (Warfarin)  Major Drug Interactions  Increased Warfarin Effect Decreased Warfarin Effect  Alcohol (large quantities) Antibiotics (esp. Septra/Bactrim, Flagyl, Cipro) Amiodarone (Cordarone) Aspirin (ASA) Cimetidine (Tagamet) Megestrol (Megace) NSAIDs (  ibuprofen, naproxen, etc.) Piroxicam (Feldene) Propafenone (Rythmol SR) Propranolol (Inderal) Isoniazid (INH) Posaconazole (Noxafil) Barbiturates (Phenobarbital) Carbamazepine (Tegretol) Chlordiazepoxide (Librium) Cholestyramine (Questran) Griseofulvin Oral Contraceptives Rifampin Sucralfate (Carafate) Vitamin K   Coumadin (Warfarin) Major Herbal Interactions  Increased Warfarin Effect Decreased Warfarin Effect   Garlic Ginseng Ginkgo biloba Coenzyme Q10 Green tea St. Johns wort    Coumadin (Warfarin) FOOD Interactions  Eat a consistent number of servings per week of foods HIGH in Vitamin K (1 serving =  cup)  Collards (cooked, or boiled & drained) Kale (cooked, or boiled & drained) Mustard greens (cooked, or boiled & drained) Parsley *serving size only =  cup Spinach (cooked, or boiled & drained) Swiss chard (cooked, or boiled & drained) Turnip greens (cooked, or boiled & drained)  Eat a consistent number of servings per week of foods MEDIUM-HIGH in Vitamin K (1 serving = 1 cup)  Asparagus (cooked, or boiled & drained) Broccoli (cooked, boiled & drained, or raw & chopped) Brussel sprouts (cooked, or boiled & drained) *serving size only =  cup Lettuce, raw (green leaf, endive, romaine) Spinach, raw Turnip greens, raw & chopped   These websites have more information on Coumadin (warfarin):  FailFactory.se; VeganReport.com.au;  Activity: 1.May walk up steps                2.No lifting more than ten pounds for four weeks.                 3.No driving for four weeks.                4.Stop any activity that causes chest pain, shortness of breath, dizziness, sweating or excessive weakness.                5.Avoid straining.                6.Continue with your breathing exercises daily.  Diet: Diabetic diet and Low fat, Low salt diet  Wound Care: May shower.  Clean wounds with mild soap and water daily. Contact the office at [phone] if any problems arise.  Mitral Valve Replacement, Care After Refer to this sheet in the next few weeks. These instructions provide you with information on caring for yourself after your procedure. Your health care provider may also give you specific instructions. Your treatment has been planned according to current medical practices, but problems sometimes occur. Call your health care provider if you have any problems or questions  after your procedure.  HOME CARE INSTRUCTIONS   Take medicines only as directed by your health care provider.  Take your temperature every morning for the first 7 days after surgery. Write these down.  Weigh yourself every morning for at least 7 days after surgery. Write your weight down.  Wear elastic stockings during the day for at least 2 weeks after surgery or as directed by your health care provider. Use them longer if your ankles are swollen. The stockings help blood flow and help reduce swelling in the legs.  Take frequent naps or rest often throughout the day.  Avoid lifting more than 10 lb (4.5 kg) or pushing or pulling things with your arms for 6-8 weeks or as directed by your health care provider.  Avoid driving or airplane travel for 4-6 weeks after surgery or as directed. If you are riding in a car for an extended period, stop every 1-2 hours to stretch your legs.  Avoid crossing your legs.  Avoid climbing stairs and using the handrail  to pull yourself up for the first 2-3 weeks after surgery.  Do not take baths for 2-4 weeks after surgery. Take showers once your health care provider approves. Pat incisions dry. Do not rub incisions with a washcloth or towel.  Return to work as directed by your health care provider.  Drink enough fluids to keep your urine clear or pale yellow.  Do not strain to have a bowel movement. Eat high-fiber foods if you become constipated. You may also take a medicine to help you have a bowel movement (laxative) as directed by your health care provider.  Resume sexual activity as directed by your health care provider. SEEK MEDICAL CARE IF:   You develop a skin rash.   Your weight is increasing each day over 2-3 days.  Your weight increases by 2 or more pounds (1 kg) in a single day.  You have a fever. SEEK IMMEDIATE MEDICAL CARE IF:   You develop chest pain that is not coming from your incision.  You develop shortness of breath or  difficulty breathing.  You have drainage, redness, swelling, or pain at your incision site.  You have pus coming from your incision.  You develop light-headedness. MAKE SURE YOU:  Understand these directions.  Will watch your condition.  Will get help right away if you are not doing well or get worse. Document Released: 11/02/2004 Document Revised: 08/30/2013 Document Reviewed: 09/15/2012 [facility] Patient Information 2015 [place], [place]. This information is not intended to replace advice given to you by your health care provider. Make sure you discuss any questions you have with your health care provider.

## 2014-08-15 NOTE — Progress Notes (Signed)
Right chest tube output for shift 7 pm to 7 am 4/17. 39ml.

## 2014-08-15 NOTE — Progress Notes (Addendum)
[address]       [place] [address]             [phone]      4 Days Post-Op Procedure(s) (LRB): REDO MITRAL VALVE (MV) REPLACEMENT (N/A) TRANSESOPHAGEAL ECHOCARDIOGRAM (TEE) (N/A) Subjective: Feels pretty well overall  Objective: Vital signs in last 24 hours: Temp:  [98.2 F (36.8 C)-98.9 F (37.2 C)] 98.2 F (36.8 C) (04/18 0450) Pulse Rate:  [65-72] 72 (04/18 0450) Cardiac Rhythm:  [-] Atrial paced (04/18 0757) Resp:  [17-22] 18 (04/18 0450) BP: (109-139)/(54-67) 139/57 mmHg (04/18 0450) SpO2:  [93 %-97 %] 97 % (04/18 0450) Weight:  [168 lb 10.4 oz (76.5 kg)] 168 lb 10.4 oz (76.5 kg) (04/18 0450)  Hemodynamic parameters for last 24 hours:    Intake/Output from previous day: 04/17 0701 - 04/18 0700 In: 1347 [P.O.:1272; I.V.:75] Out: 1205 [Urine:1125; Chest Tube:80] Intake/Output this shift:    General appearance: alert, cooperative and no distress Heart: regular rate and rhythm Lungs: dim in right base Abdomen: benign Extremities: no edema Wound: incis healing well  Lab Results:  Recent Labs  08/14/14 0507 08/15/14 0631  WBC 11.2* 8.4  HGB 8.0* 7.9*  HCT 24.7* 24.0*  PLT 152 PENDING   BMET:  Recent Labs  08/14/14 0507 08/15/14 0631  NA 130* 133*  K 3.7 3.7  CL 97 101  CO2 24 23  GLUCOSE 105* 110*  BUN 13 11  CREATININE 0.98 0.83  CALCIUM 7.9* 8.0*    PT/INR:  Recent Labs  08/15/14 0631  LABPROT 18.4*  INR 1.51*   ABG    Component Value Date/Time   PHART 7.353 08/12/2014 0453   HCO3 20.0 08/12/2014 0453   TCO2 21 08/12/2014 1641   ACIDBASEDEF 5.0* 08/12/2014 0453   O2SAT 95.0 08/12/2014 0453   CBG (last 3)   Recent Labs  08/14/14 1610 08/14/14 2050 08/15/14 0600  GLUCAP 115* 129* 111*    Meds Scheduled Meds: . acetaminophen  1,000 mg Oral 4 times per day  . aspirin EC  81 mg Oral Daily  . bisacodyl  10 mg Oral Daily   Or  . bisacodyl  10 mg Rectal Daily  . docusate sodium  200 mg Oral  Daily  . folic acid  1 mg Oral Daily  . furosemide  40 mg Oral Daily  . insulin aspart  0-15 Units Subcutaneous TID WC  . pantoprazole  40 mg Oral Daily  . potassium chloride  20 mEq Oral BID  . sodium chloride  3 mL Intravenous Q12H  . warfarin  2.5 mg Oral q1800  . Warfarin - Physician Dosing Inpatient   Does not apply q1800   Continuous Infusions:  PRN Meds:.sodium chloride, ALPRAZolam, alum & mag hydroxide-simeth, guaiFENesin-dextromethorphan, loratadine, magnesium hydroxide, ondansetron (ZOFRAN) IV, oxyCODONE, sodium chloride, traMADol, zolpidem  Xrays Dg Chest 2 View  08/15/2014   CLINICAL DATA:  Status post mitral valve replacement  EXAM: CHEST  2 VIEW  COMPARISON:  08/14/2014  FINDINGS: Cardiac shadow is stable. Postsurgical changes are again noted. A thoracostomy catheter is noted on the right. The left thoracostomy catheter has been removed. Persistent linear density is noted in the left mid lung as well as significant right basilar infiltrate. No pneumothorax is seen.  IMPRESSION: Interval chest tube removal on the left without pneumothorax. The remainder of the exam is stable.   Electronically Signed   By: Inez Catalina M.D.   On: 08/15/2014 07:52  Dg Chest Port 1 View  08/14/2014   CLINICAL DATA:  Postop day 4 following cardiac surgery and mitral valve replacement.  EXAM: PORTABLE CHEST - 1 VIEW  COMPARISON:  08/13/2014  FINDINGS: Cardiac silhouette is mildly enlarged. There is no mediastinal widening.  There is persistent opacity at the right lung base obscuring the hemidiaphragm consistent with a combination of pleural fluid with parenchymal opacity, likely atelectasis, residual asymmetric edema or a combination. Mild atelectasis is noted at the left lung base and adjacent to the left chest tube. There is no pneumothorax.  Left internal jugular introducer sheath is stable as are the bilateral chest tubes. Mediastinal tube has been removed.  IMPRESSION: 1. Persistent right lower lung  zone opacity likely combination of pleural fluid with atelectasis and/or asymmetric pulmonary edema. Aeration of the lungs is similar to the previous day's study. No new lung opacities. 2. No pneumothorax. No mediastinal widening. Remaining support apparatus is well positioned.   Electronically Signed   By: Lajean Manes M.D.   On: 08/14/2014 07:25    Assessment/Plan: S/P Procedure(s) (LRB): REDO MITRAL VALVE (MV) REPLACEMENT (N/A) TRANSESOPHAGEAL ECHOCARDIOGRAM (TEE) (N/A)  1 doing well 2 H/H is stable- ABL anemia, monitor, platelet count pending 3 renal fxn normal, mild hyponatremia- cont gentle diuresis for volume overload 4 180 ml from CT yesterday- prob d/c soon 5 right patchy infiltrate appears a bit better on CXR 6 appears to be in sinus with IVCD, PVC's- hemodyn stable 7 conts coumadin for mech valve  LOS: 6 days    GOLD,WAYNE E 08/15/2014  I have seen and examined the patient and agree with the assessment and plan as outlined.  D/C last remaining chest tube.  D/C pacing wires in am tomorrow if rhythm stable.  Mobilize.  Diuresis.  Watch anemia.  Increase coumadin dose.  Anticipate possible D/C home in 2-3 days.  Rexene Alberts 08/15/2014 9:30 AM

## 2014-08-15 NOTE — Progress Notes (Signed)
Per MD order chest tube pulled. Pt educated prior to d/c'ing chest tube. Along with other nurse, chest tube pulled after sutures cut and loosened, incision closed pulling incision tight putting 5 knots in and covering with vasoline gauze and dry gauze. Pt tolerated well. Call bell and phone in pt's reach. Cato Mulligan RN .

## 2014-08-16 ENCOUNTER — Inpatient Hospital Stay (HOSPITAL_COMMUNITY): Payer: 59

## 2014-08-16 LAB — BASIC METABOLIC PANEL
Anion gap: 9 (ref 5–15)
BUN: 8 mg/dL (ref 6–23)
CO2: 23 mmol/L (ref 19–32)
Calcium: 8.3 mg/dL — ABNORMAL LOW (ref 8.4–10.5)
Chloride: 103 mmol/L (ref 96–112)
Creatinine, Ser: 0.87 mg/dL (ref 0.50–1.35)
GFR calc Af Amer: >90 mL/min (ref >90)
GFR calc non Af Amer: >90 mL/min (ref >90)
Glucose, Bld: 101 mg/dL — ABNORMAL HIGH (ref 70–99)
Potassium: 3.6 mmol/L (ref 3.5–5.1)
Sodium: 135 mmol/L (ref 135–145)

## 2014-08-16 LAB — ANAEROBIC CULTURE: Gram Stain: NONE SEEN

## 2014-08-16 LAB — CULTURE, BLOOD (ROUTINE X 2)
Culture: NO GROWTH
Culture: NO GROWTH

## 2014-08-16 LAB — CBC
HCT: 25.5 % — ABNORMAL LOW (ref 39.0–52.0)
Hemoglobin: 8 g/dL — ABNORMAL LOW (ref 13.0–17.0)
MCH: 26.5 pg (ref 26.0–34.0)
MCHC: 31.4 g/dL (ref 30.0–36.0)
MCV: 84.4 fL (ref 78.0–100.0)
Platelets: 213 10*3/uL (ref 150–400)
RBC: 3.02 MIL/uL — ABNORMAL LOW (ref 4.22–5.81)
RDW: 22.4 % — ABNORMAL HIGH (ref 11.5–15.5)
WBC: 7 10*3/uL (ref 4.0–10.5)

## 2014-08-16 LAB — GLUCOSE, CAPILLARY
Glucose-Capillary: 107 mg/dL — ABNORMAL HIGH (ref 70–99)
Glucose-Capillary: 86 mg/dL (ref 70–99)
Glucose-Capillary: 95 mg/dL (ref 70–99)
Glucose-Capillary: 101 mg/dL — ABNORMAL HIGH (ref 70–99)

## 2014-08-16 LAB — PROTIME-INR
INR: 1.59 — ABNORMAL HIGH (ref 0.00–1.49)
Prothrombin Time: 19.1 seconds — ABNORMAL HIGH (ref 11.6–15.2)

## 2014-08-16 MED ORDER — POTASSIUM CHLORIDE CRYS ER 20 MEQ PO TBCR
40.0000 meq | EXTENDED_RELEASE_TABLET | Freq: Once | ORAL | Status: AC
  Administered 2014-08-16: 40 meq via ORAL

## 2014-08-16 NOTE — Addendum Note (Signed)
Addendum  created 08/16/14 0010 by Roberts Gaudy, MD   Modules edited: Anesthesia Attestations

## 2014-08-16 NOTE — Progress Notes (Signed)
DC EPW per MD orders and protocol; wires intact; no bleeding; VS Q15 for one hour; bedrest for one hour; call bell within reach; will continue to monitor.  Rowe Pavy, RN

## 2014-08-16 NOTE — Progress Notes (Signed)
CARDIAC REHAB PHASE I   PRE:  Rate/Rhythm: 80 SR  BP:  Supine:   Sitting: 124/60  Standing:    SaO2: 95%RA  MODE:  Ambulation: 874ft   POST:  Rate/Rhythm: 88  BP:  Supine:   Sitting: 130/70  Standing:    SaO2: 93%RA 0943-1007 Pt walked 890 ft on RA with rolling walker with steady gait. Do not think pt needs walker for home but he likes to use here. Tolerated well. To recliner after walk.   Graylon Good, RN BSN  08/16/2014 10:49 AM

## 2014-08-16 NOTE — Progress Notes (Addendum)
      [address]       [place] [address]             [phone]        5 Days Post-Op Procedure(s) (LRB): REDO MITRAL VALVE (MV) REPLACEMENT (N/A) TRANSESOPHAGEAL ECHOCARDIOGRAM (TEE) (N/A)  Subjective: Patient about to eat breakfast. No complaints.  Objective: Vital signs in last 24 hours: Temp:  [98.5 F (36.9 C)-99.9 F (37.7 C)] 98.5 F (36.9 C) (04/19 0401) Pulse Rate:  [73-74] 74 (04/19 0401) Cardiac Rhythm:  [-] Atrial paced;Bundle branch block (04/18 2300) Resp:  [18] 18 (04/19 0401) BP: (113-136)/(51-69) 136/69 mmHg (04/19 0401) SpO2:  [96 %-98 %] 96 % (04/19 0401) Weight:  [166 lb 3.6 oz (75.4 kg)] 166 lb 3.6 oz (75.4 kg) (04/19 0401)  Pre op weight 69 kg Current Weight  08/16/14 166 lb 3.6 oz (75.4 kg)      Intake/Output from previous day: 04/18 0701 - 04/19 0700 In: 540 [P.O.:540] Out: 1000 [Urine:1000]   Physical Exam:  Cardiovascular: RRR, no murmurs Pulmonary: Clear to auscultation on left and crackles on right base. Abdomen: Soft, non tender, bowel sounds present. Extremities: Trace bilateral lower extremity edema. Wounds: Clean and dry.  No erythema or signs of infection.  Lab Results: CBC: Recent Labs  08/15/14 0631 08/16/14 0545  WBC 8.4 7.0  HGB 7.9* 8.0*  HCT 24.0* 25.5*  PLT 161 213   BMET:  Recent Labs  08/15/14 0631 08/16/14 0545  NA 133* 135  K 3.7 3.6  CL 101 103  CO2 23 23  GLUCOSE 110* 101*  BUN 11 8  CREATININE 0.83 0.87  CALCIUM 8.0* 8.3*    PT/INR:  Lab Results  Component Value Date   INR 1.59* 08/16/2014   INR 1.51* 08/15/2014   INR 1.71* 08/14/2014   ABG:  INR: Will add last result for INR, ABG once components are confirmed Will add last 4 CBG results once components are confirmed  Assessment/Plan:  1. CV - SR in the 70's. On Coumadin 5 mg daily (for mechanical valve). INR slightly increased from 1.51 to 1.59. Pacer is turned off-was on back up previously. Likely disconnect.    2.  Pulmonary - On room air. CXR appears to show no pneumothorax, right pleural effusion. Encourage incentive spirometer 3. Volume Overload - On Lasix 40 mg daily 4.  Acute blood loss anemia - H and H stable at 8 and 25.5 5. Supplement potassium 6. Hope to remove EPW (previously paced and not on back up) soon so INR does not get too high  ZIMMERMAN,DONIELLE MPA-C 08/16/2014,7:30 AM  I have seen and examined the patient and agree with the assessment and plan as outlined.  D/C pacing wires.  Possible D/C home 1-2 days.  Rexene Alberts 08/16/2014 8:25 AM

## 2014-08-16 NOTE — Discharge Summary (Signed)
Physician Discharge Summary       [address]       [place] [address]             [phone]    Patient ID: David [name] MRN: [id number] DOB/AGE: December 01, 1960 54 y.o.  Admit date: 08/09/2014 Discharge date: 08/17/2014  Admission Diagnoses: 1. Severe symptomatic recurrent mitral regurgitation 2. S/p mitral valve repair (via mini thoracotomy )09' 3. History of tobacco abuse 4. History of hemolytic anemia 5. History of GERD 6. History of right spontaneous pneumothorax 7. History of endocarditis (01',09') 8. History of prostate cancer 9. History of C. Dif colitis 10. History of DDD  Discharge Diagnoses:  1. Severe symptomatic recurrent mitral regurgitation 2. S/p mitral valve repair (via mini thoracotomy )09' 3. History of tobacco abuse 4. History of hemolytic anemia 5. History of GERD 6. History of right spontaneous pneumothorax 7. History of endocarditis (01',09') 8. History of prostate cancer 9. History of C. Dif colitis 10. History of DDD  Procedure (s):   Redo Mitral Valve Replacement Sorin Carbomedics Optiform bileaflet mechanical prosthesis (size 77mm, catalog #[id number], serial [id number]) by Dr. Roxy Manns on 08/11/2014.  History of Presenting Illness:  This is a 54 year old African-American male from [place] with history of mitral valve endocarditis in 2001 and recurrent endocarditis in 0102 complicated by congestive heart failure. He underwent mitral valve repair 03/22/2008 via right mini thoracotomy approach. Findings at the time of surgery were notable for the presence of a large perforation in the anterior leaflet with active vegetations and a flail segment of the posterior leaflet (P3) with multiple ruptured primary chordae tendineae. The patient recovered uneventfully and was discharged from the hospital on the fourth postoperative day. His subsequent convalescence was uneventful and follow-up echocardiogram performed 04/01/2008  revealed intact mitral valve repair with no residual mitral regurgitation. The patient completely recovered and returned to normal physical activity without any limitations. He was last seen in follow-up in our office on 11/23/2008. The patient did very well until approximately 6 or 7 months ago when he first began to develop mild symptoms of fatigue. Symptoms gradually progressed and the patient began to experience some exertional shortness of breath and occasional pressure across his chest. He was seen by his primary care physician (Dr. [name]) in early February, and routine blood work revealed a hemoglobin of 5.9. He was admitted to the hospital 05/31/2014 and transfused 2 units packed red blood cells with increase in hemoglobin to 9.0. The patient was treated with IV proton X and gastroenterology was consulted. The patient's stool was Hemoccult negative. EGD was performed and revealed no sign of GI bleeding. Patient was noted to have had colonoscopy in 2013. The patient was discharged from the hospital 06/02/2014 and followed closely by Dr. [name]. He reported intermittent night sweats with recurrent exertional shortness breath and chest pressure. Follow-up blood work obtained 06/20/2014 revealed the patient's hemoglobin had dropped to again to 7.2. He was readmitted to the hospital 06/21/2014 and transfused another 2 units of PRBCs. He was seen in consultation by Dr. Burr Medico from hematology. His hemoglobin increased appropriately with transfusion and the patient was discharged home. Subsequent lab workup confirmed the patient had findings consistent with severe hemolysis. He was treated with a short course of high-dose prednisone for presumed autoimmune hemolytic anemia, but this has subsequently been stopped. Cardiology consultation was requested and the patient was seen in the office by Dr. Acie Fredrickson. He was noted have a systolic murmur on exam. Transesophageal echocardiogram was performed  07/19/2014  by Dr. Stanford Breed. This revealed the presence of severe mitral regurgitation. The patient was for surgical consultation and seen in consultation on 08/03/2014. Plans were made for the patient to be admitted to the hospital for transfusion prior to left and right heart catheterization with tentative plans for redo mitral valve repair or replacement on Thursday 08/11/2014.  The patient is married and lives locally in [place] with his wife and 2 of his adult children. Until recently has lived an active lifestyle. He describes a several month history of progressive fatigue with more recent development of exertional shortness of breath. He denies any history of resting shortness of breath, PND, orthopnea, or lower extremity edema. He has not had dizzy spells or palpitations. He denies any fevers or chills. He has had some night sweats. Appetite is normal. He states that he lost some weight but has been gaining it back. He denies any arthritis or arthralgias  Patient has stage D severe symptomatic recurrent mitral regurgitation associated with severe hemolysis and recent onset symptoms of exertional shortness of breath consistent with acute diastolic congestive heart failure. Dr. Roxy Manns personally reviewed the patient's recent transthoracic and transesophageal echocardiogram. There is severe mitral regurgitation with what may be a perforation of a segment of the posterior leaflet. Left ventricular size and systolic function remai normal. There are no obvious vegetations or other signs of active bacterial endocarditis.  Dr. Roxy Manns discussed the indications, risks, and potential benefits of redo mitral valve repair or replacement with the patient. In the event that their valve cannot be successfully repaired, we discussed the possibility of replacing the mitral valve using a mechanical prosthesis with the attendant need for long-term anticoagulation versus the alternative of replacing it using a bioprosthetic tissue  valve with its potential for late structural valve deterioration and failure, depending upon the patient's longevity. The patient specifically requests that if the mitral valve must be replaced that it be done using a mechanical valve. He underwent a redo sternotomy for mitral valve replacement on 08/11/2014.  Brief Hospital Course:  The patient was extubated late the evening of surgery without difficulty. He remained afebrile and hemodynamically stable. He initially was AAI paced and on Milrinone, Dopamine, and Neo synephrine drips. Gordy Councilman, a line, chest tubes, and foley were removed early in the post operative course. Lopressor was not started because of bradycardia. He was started on Coumadin for mechanical valve. PT and INR were monitored daily. Last INR was 2.38.He was volume over loaded and diuresed. He had ABL anemia. He did not require a post op transfusion. His last H and H was 8 and 25.5. He was weaned off the insulin drip.  The patient's HGA1C pre op was 5.3. The patient was felt surgically stable for transfer from the ICU to PCTU for further convalescence on 08/15/2014. He continues to progress with cardiac rehab. He was ambulating on room air. He has been tolerating a diet and has had a bowel movement. Epicardial pacing wires and chest tube sutures will be removed prior to discharge. The patient is felt surgically stable for discharge 08/17/2014.  Latest Vital Signs: Blood pressure 117/59, pulse 78, temperature 99.3 F (37.4 C), temperature source Oral, resp. rate 18, height 5\' 10"  (1.778 m), weight 161 lb 9.6 oz (73.3 kg), SpO2 97 %.  Physical Exam: Cardiovascular: RRR, no murmurs Pulmonary: Clear to auscultation on left and crackles on right base. Abdomen: Soft, non tender, bowel sounds present. Extremities: Trace bilateral lower extremity edema. Wounds: Clean and dry.  No erythema or signs of infection.  Discharge Condition:Stable and discharged to home  Recent laboratory  studies:  Lab Results  Component Value Date   WBC 7.0 08/16/2014   HGB 8.0* 08/16/2014   HCT 25.5* 08/16/2014   MCV 84.4 08/16/2014   PLT 213 08/16/2014   Lab Results  Component Value Date   NA 135 08/16/2014   K 3.6 08/16/2014   CL 103 08/16/2014   CO2 23 08/16/2014   CREATININE 0.87 08/16/2014   GLUCOSE 101* 08/16/2014     Diagnostic Studies: Dg Chest 2 View  08/16/2014   CLINICAL DATA:  Right pleural effusion, followup  EXAM: CHEST  2 VIEW  COMPARISON:  Chest x-ray of 08/15/2014, and CT chest of 06/22/2014  FINDINGS: The right lower lobe opacity is unchanged and may be all postoperative in nature. Small pleural effusions are unchanged. Cardiomegaly is stable and mitral valve replacement again is noted. No pneumothorax is seen.  IMPRESSION: 1. Little change in right lower lobe opacity possibly postoperative in nature. Pneumonia cannot be excluded. 2. Small pleural effusions.   Electronically Signed   By: Ivar Drape M.D.   On: 08/16/2014 08:09  Discharge Instructions    Amb Referral to Cardiac Rehabilitation    Complete by:  As directed           Discharge Medications:  The patient has been discharged on:   1.Beta Blocker:  Yes [   ]                              No   [  x ]                              If No, reason: Bradycardia  2.Ace Inhibitor/ARB: Yes [   ]                                     No  [  x  ]                                     If No, reason: Preserved LVEF, no CAD  3.Statin:   Yes [   ]                  No  [  x ]                  If No, reason: No CAD, hyperlipidemia  4.Shela Commons:  Yes  [  x ]                  No   [   ]                  If No, reason:    Medication List    TAKE these medications        ALPRAZolam 0.25 MG tablet  Commonly known as:  XANAX  Take 1 tablet (0.25 mg total) by mouth every 6 (six) hours as needed for anxiety.     aspirin 81 MG EC tablet  Take 1 tablet (81 mg total) by mouth daily.     coumadin book Misc  1 each by  Does not apply route once.  folic acid 1 MG tablet  Commonly known as:  FOLVITE  Take 1 tablet (1 mg total) by mouth daily.     furosemide 40 MG tablet  Commonly known as:  LASIX  Take 1 tablet (40 mg total) by mouth daily. For 7 Days     loratadine 10 MG tablet  Commonly known as:  CLARITIN  Take 1 tablet (10 mg total) by mouth daily.     oxyCODONE 5 MG immediate release tablet  Commonly known as:  Oxy IR/ROXICODONE  Take 1-2 tablets (5-10 mg total) by mouth every 3 (three) hours as needed for severe pain.     potassium chloride SA 20 MEQ tablet  Commonly known as:  K-DUR,KLOR-CON  Take 2 tablets (40 mEq total) by mouth 2 (two) times daily.     warfarin 2.5 MG tablet  Commonly known as:  COUMADIN  Take 1 tablet (2.5 mg total) by mouth every Monday,Wednesday,Friday, and Sunday at 6 PM.     warfarin 5 MG tablet  Commonly known as:  COUMADIN  Take 1 tablet (5 mg total) by mouth every Tuesday, Thursday, and Saturday at 6 PM.         Follow Up Appointments: Follow-up Information    Follow up with Kirk Ruths, MD.   Specialty:  Cardiology   Why:  Call for a follow up appointment for 2 weeks   Contact information:   [place] [address] [phone]       Follow up with Rexene Alberts, MD On 09/19/2014.   Specialty:  Cardiothoracic Surgery   Why:  PA/LAT CXR to be taken (at [facility] which is in the same building as Dr. Guy Sandifer office) on 09/19/2014 at 10:30 am;Appointment time is at 11:30 am   Contact information:   [place] [address] [phone]       Follow up with Kirk Ruths, MD.   Specialty:  Cardiology   Why:  Call to have PT and INR (as is on Couamdin for mechanical valve) drawn on Friday 08/19/2014   Contact information:   [place] [address] [phone]       Signed: Cinda Quest 08/17/2014, 11:15 AM

## 2014-08-16 NOTE — Progress Notes (Signed)
Utilization review completed.  

## 2014-08-17 LAB — PROTIME-INR
INR: 2.38 — ABNORMAL HIGH (ref 0.00–1.49)
Prothrombin Time: 26.2 seconds — ABNORMAL HIGH (ref 11.6–15.2)

## 2014-08-17 MED ORDER — ALPRAZOLAM 0.25 MG PO TABS: 0.2500 mg | ORAL_TABLET | Freq: Four times a day (QID) | ORAL | Status: DC | PRN

## 2014-08-17 MED ORDER — COUMADIN BOOK
Freq: Once | Status: DC
  Filled 2014-08-17: qty 1

## 2014-08-17 MED ORDER — OXYCODONE HCL 5 MG PO TABS: 5.0000 mg | ORAL_TABLET | ORAL | Status: DC | PRN

## 2014-08-17 MED ORDER — FUROSEMIDE 40 MG PO TABS: 40.0000 mg | ORAL_TABLET | Freq: Every day | ORAL | Status: DC

## 2014-08-17 MED ORDER — WARFARIN SODIUM 2.5 MG PO TABS: 2.5000 mg | ORAL_TABLET | ORAL | Status: DC

## 2014-08-17 MED ORDER — WARFARIN SODIUM 2.5 MG PO TABS
2.5000 mg | ORAL_TABLET | Freq: Every day | ORAL | Status: DC
  Filled 2014-08-17: qty 1

## 2014-08-17 MED ORDER — ASPIRIN 81 MG PO TBEC: 81.0000 mg | DELAYED_RELEASE_TABLET | Freq: Every day | ORAL | Status: AC

## 2014-08-17 MED ORDER — COUMADIN BOOK: 1.0000 | Freq: Once | Status: DC

## 2014-08-17 MED ORDER — WARFARIN SODIUM 5 MG PO TABS: 5.0000 mg | ORAL_TABLET | ORAL | Status: DC

## 2014-08-17 NOTE — Progress Notes (Signed)
TCTS BRIEF PROGRESS NOTE   Doing well D/C home today Coumadin 2.5 mg today then alternating 2.5 and 5 mg  David [name] 08/17/2014 10:32 AM

## 2014-08-17 NOTE — Progress Notes (Signed)
0824-0848 Education completed re activity. Encouraged IS. Pt stated would like to see pharmacist re Coumadin ed. Notified RN. Put on discharge video and wrote down how pt can view Coumadin video next. Pt stated he was on Coumadin a few months after first surgery but would like a review. Discussed CRP 2 and pt gave permission for referral to [facility]. Gave heart healthy diet for review. Encouraged pt to walk three times today if not discharged.Graylon Good RN BSN 08/17/2014 8:48 AM

## 2014-08-17 NOTE — Progress Notes (Signed)
Removed CT sutures and applied benzoin and 1/2 " steri strips.  Pt tolerated procedure well. Pt given signs and symptoms of infection. Daisi Kentner McClintock, RN    

## 2014-08-17 NOTE — Progress Notes (Signed)
Pt/family given discharge instructions, medication lists, follow up appointments, and when to call the doctor.  Pt/family verbalizes understanding. Pt given signs and symptoms of infection. Pt given information sheets on aspirin, coumadin, potassium, and lasix. Pt transporting to main entrance by volunteers. Payton Emerald, RN

## 2014-08-19 ENCOUNTER — Ambulatory Visit (INDEPENDENT_AMBULATORY_CARE_PROVIDER_SITE_OTHER): Payer: 59 | Admitting: Pharmacist Clinician (PhC)/ Clinical Pharmacy Specialist

## 2014-08-19 DIAGNOSIS — Z7901 Long term (current) use of anticoagulants: Secondary | ICD-10-CM

## 2014-08-19 DIAGNOSIS — Z954 Presence of other heart-valve replacement: Secondary | ICD-10-CM | POA: Diagnosis not present

## 2014-08-19 DIAGNOSIS — I059 Rheumatic mitral valve disease, unspecified: Secondary | ICD-10-CM

## 2014-08-19 LAB — POCT INR: INR: 2.5

## 2014-08-21 NOTE — Progress Notes (Signed)
HPI: FU MVR. He has history of endocarditis. In November of 2009 he had mitral valve repair via a mini thoracotomy. Recently seen for hemolytic anemia. Transesophageal echocardiogram 3/16 revealed normal LV function, s/p MV repair with severe mitral regurgitation directed posteriorly laterally possibly from a perforated posterior mitral valve leaflet. There was a smaller second jet as well.Cath 4/16 showed EF 55, severe MR, severe pulm HTN, 40 LAD. Pt had MVR (bileaflet mechanical prosthesis) 4/16. Preoperative CT revealed a 10 mm nodule in the right middle lobe and biopsy or PET CT scan recommended. Possible artifact versus mass in pancreatic head. Since last seen, he feels much better. He denies dyspnea, chest pain, palpitations or syncope.  Current Outpatient Prescriptions  Medication Sig Dispense Refill  . ALPRAZolam (XANAX) 0.25 MG tablet Take 1 tablet (0.25 mg total) by mouth every 6 (six) hours as needed for anxiety. 30 tablet 0  . aspirin EC 81 MG EC tablet Take 1 tablet (81 mg total) by mouth daily.    . coumadin book MISC 1 each by Does not apply route once.    . folic acid (FOLVITE) 1 MG tablet Take 1 tablet (1 mg total) by mouth daily. 30 tablet 3  . furosemide (LASIX) 40 MG tablet Take 1 tablet (40 mg total) by mouth daily. For 7 Days 7 tablet 0  . loratadine (CLARITIN) 10 MG tablet Take 1 tablet (10 mg total) by mouth daily. 30 tablet 3  . oxyCODONE (OXY IR/ROXICODONE) 5 MG immediate release tablet Take 1-2 tablets (5-10 mg total) by mouth every 3 (three) hours as needed for severe pain. 30 tablet 0  . potassium chloride SA (K-DUR,KLOR-CON) 20 MEQ tablet Take 2 tablets (40 mEq total) by mouth 2 (two) times daily. 30 tablet 2  . warfarin (COUMADIN) 2.5 MG tablet Take 1 tablet (2.5 mg total) by mouth every Monday,Wednesday,Friday, and Sunday at 6 PM. 30 tablet 3  . warfarin (COUMADIN) 5 MG tablet Take 1 tablet (5 mg total) by mouth every Tuesday, Thursday, and Saturday at 6 PM.  30 tablet 3   No current facility-administered medications for this visit.     Past Medical History  Diagnosis Date  . GERD (gastroesophageal reflux disease)   . Endocarditis 10-1999     RECURRENCE 02-2008  . History of chest tube placement 1980    R SIDE FOR SPONTANEOUS  PNEUMOTHORAX  . Periodontitis     PMH  . C. difficile colitis 2009  . External hemorrhoids without mention of complication 94/80/1655    Colonoscopy  . History of blood transfusion 02/2008, 04/2014.     for anemia.   . Prostate cancer     "5 of the samples from the biopsy was positive; Greeson # is 6"  . DDD (degenerative disc disease), lumbar   . Anemia 2009, 04/2014.   . Mitral regurgitation 07/19/2014    Recurrent severe mitral regurgitation s/p mitral valve repair 2009  . Hemolytic anemia 06/29/2014  . S/P redo mitral valve replacement with metallic valve 3/74/8270    31 mm Sorin Carbomedics Optiform bileaflet mechanical prosthesis    Past Surgical History  Procedure Laterality Date  . Colonoscopy  2005, 03/2012    external rrhoids 2005, normal exam 2013; [facility]  . Multiple tooth extractions  2009    "to prep for mitral valve repair"  . Mitral valve repair  2009    via a mini thoracotomy  . Prostate biopsy  03/2014  . Cardiac catheterization  2009  . Esophagogastroduodenoscopy N/A 06/02/2014    Procedure: ESOPHAGOGASTRODUODENOSCOPY (EGD);  Surgeon: Jerene Bears, MD;  Location: [facility] ENDOSCOPY;  Service: Endoscopy;  Laterality: N/A;  . Tee without cardioversion N/A 07/19/2014    Procedure: TRANSESOPHAGEAL ECHOCARDIOGRAM (TEE);  Surgeon: Lelon Perla, MD;  Location: [facility] ENDOSCOPY;  Service: Cardiovascular;  Laterality: N/A;  . Left and right heart catheterization with coronary angiogram N/A 08/10/2014    Procedure: LEFT AND RIGHT HEART CATHETERIZATION WITH CORONARY ANGIOGRAM;  Surgeon: Belva Crome, MD;  Location: [facility] CATH LAB;  Service: Cardiovascular;  Laterality: N/A;  . Mitral valve repair N/A  08/11/2014    Procedure: REDO MITRAL VALVE (MV) REPLACEMENT;  Surgeon: Rexene Alberts, MD;  Location: [facility];  Service: Open Heart Surgery;  Laterality: N/A;  NO NECK LINES ON RIGHT SIDE  . Tee without cardioversion N/A 08/11/2014    Procedure: TRANSESOPHAGEAL ECHOCARDIOGRAM (TEE);  Surgeon: Rexene Alberts, MD;  Location: [facility];  Service: Open Heart Surgery;  Laterality: N/A;    History   Social History  . Marital Status: Married    Spouse Name: N/A  . Number of Children: N/A  . Years of Education: N/A   Occupational History  . 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 newspaper delivery     delivers papers to distributors, part time on weekends.    Social History Main Topics  . Smoking status: Former Smoker -- 2.00 packs/day for 15 years    Types: Cigarettes    Quit date: 08/08/1989  . Smokeless tobacco: Never Used  . Alcohol Use: No     Comment: "stopped drinking in 1991; never treated for abuse"  . Drug Use: No     Comment: "stopped using drugs in ~ 1984"  . Sexual Activity: No   Other Topics Concern  . Not on file   Social History Narrative   REGULAR EXERCISE 2X [employer]READMILL,WEIGHTS    ROS: no fevers or chills, productive cough, hemoptysis, dysphasia, odynophagia, melena, hematochezia, dysuria, hematuria, rash, seizure activity, orthopnea, PND, pedal edema, claudication. Remaining systems are negative.  Physical Exam: Well-developed well-nourished in no acute distress.  Skin is warm and dry.  HEENT is normal.  Neck is supple.  Chest is clear to auscultation with normal expansion. Sternotomy without evidence of infection. Cardiovascular exam is regular rate and rhythm. Crisp mechanical valve sound Abdominal exam nontender or distended. No masses palpated. Extremities show no edema. neuro grossly intact

## 2014-08-24 ENCOUNTER — Encounter: Payer: Self-pay | Admitting: Cardiology

## 2014-08-24 ENCOUNTER — Ambulatory Visit (INDEPENDENT_AMBULATORY_CARE_PROVIDER_SITE_OTHER): Payer: 59 | Admitting: Cardiology

## 2014-08-24 ENCOUNTER — Encounter: Payer: Self-pay | Admitting: *Deleted

## 2014-08-24 ENCOUNTER — Ambulatory Visit (INDEPENDENT_AMBULATORY_CARE_PROVIDER_SITE_OTHER): Payer: 59 | Admitting: Pharmacist Clinician (PhC)/ Clinical Pharmacy Specialist

## 2014-08-24 VITALS — BP 122/84 | HR 89 | Ht 70.5 in | Wt 153.4 lb

## 2014-08-24 DIAGNOSIS — R911 Solitary pulmonary nodule: Secondary | ICD-10-CM

## 2014-08-24 DIAGNOSIS — Z7901 Long term (current) use of anticoagulants: Secondary | ICD-10-CM | POA: Diagnosis not present

## 2014-08-24 DIAGNOSIS — Z954 Presence of other heart-valve replacement: Secondary | ICD-10-CM | POA: Diagnosis not present

## 2014-08-24 DIAGNOSIS — I059 Rheumatic mitral valve disease, unspecified: Secondary | ICD-10-CM

## 2014-08-24 LAB — POCT INR: INR: 2.3

## 2014-08-24 NOTE — Assessment & Plan Note (Signed)
Patient's recent hemolytic anemia was felt secondary to his mitral regurgitation. This has now been corrected as he is status post mitral valve replacement. We will repeat CBC in 1 week.

## 2014-08-24 NOTE — Patient Instructions (Signed)
Your physician recommends that you schedule a follow-up appointment in: [place] has requested that you have an echocardiogram. Echocardiography is a painless test that uses sound waves to create images of your heart. It provides your doctor with information about the size and shape of your heart and how well your heart's chambers and valves are working. This procedure takes approximately one hour. There are no restrictions for this procedure.SCHEDULE IN 4 WEEKS  Non-Cardiac CT scanning, (CAT scanning), is a noninvasive, special x-ray that produces cross-sectional images of the body using x-rays and a computer. CT scans help physicians diagnose and treat medical conditions. For some CT exams, a contrast material is used to enhance visibility in the area of the body being studied. CT scans provide greater clarity and reveal more details than regular x-ray exams.CT OF THE CHEST WO TO F/U LUNG NODULE ALSO NEED TO LOOK AT PANCREAS  Your physician recommends that you return for lab work in: [place]

## 2014-08-24 NOTE — Assessment & Plan Note (Signed)
We will arrange a baseline echocardiogram status post mitral valve replacement in 4 weeks. Patient provided an SBE card. Continue Coumadin and aspirin.

## 2014-08-24 NOTE — Assessment & Plan Note (Signed)
Plan noncontrast chest CT in late May to follow-up on nodule. We will include the pancreas given question abnormality on previous study which was felt to be possibly artifact.

## 2014-08-26 ENCOUNTER — Ambulatory Visit: Payer: 59 | Admitting: Cardiovascular Disease

## 2014-08-29 ENCOUNTER — Encounter (HOSPITAL_COMMUNITY): Payer: Self-pay

## 2014-08-29 ENCOUNTER — Ambulatory Visit (HOSPITAL_COMMUNITY)
Admission: RE | Admit: 2014-08-29 | Discharge: 2014-08-29 | Disposition: A | Discharge status: Home / Self Care | Payer: 59 | Source: Ambulatory Visit | Attending: Cardiology | Admitting: Cardiology

## 2014-08-29 DIAGNOSIS — R911 Solitary pulmonary nodule: Secondary | ICD-10-CM

## 2014-08-30 ENCOUNTER — Other Ambulatory Visit: Payer: Self-pay | Admitting: *Deleted

## 2014-08-30 ENCOUNTER — Other Ambulatory Visit: Payer: Self-pay | Admitting: Physician Assistant

## 2014-08-30 DIAGNOSIS — G8918 Other acute postprocedural pain: Secondary | ICD-10-CM

## 2014-08-30 MED ORDER — OXYCODONE HCL 5 MG PO TABS: 5.0000 mg | ORAL_TABLET | ORAL | Status: DC | PRN

## 2014-08-30 NOTE — Telephone Encounter (Signed)
I called David [name] to inform him that a new RX for Oxycodone would be available at the front desk today for him and he agreed.

## 2014-08-31 ENCOUNTER — Telehealth: Payer: Self-pay | Admitting: Hematology

## 2014-08-31 NOTE — Telephone Encounter (Signed)
pt & adv no order for labs-pt stated Dr Stanford Breed wanted him to sch labs-adv he would need to call Dr Jacalyn Lefevre office and inquire about labs -pt understood

## 2014-09-01 ENCOUNTER — Other Ambulatory Visit: Payer: Self-pay | Admitting: Pharmacist Clinician (PhC)/ Clinical Pharmacy Specialist

## 2014-09-01 ENCOUNTER — Ambulatory Visit (INDEPENDENT_AMBULATORY_CARE_PROVIDER_SITE_OTHER): Payer: 59 | Admitting: Pharmacist Clinician (PhC)/ Clinical Pharmacy Specialist

## 2014-09-01 DIAGNOSIS — I059 Rheumatic mitral valve disease, unspecified: Secondary | ICD-10-CM | POA: Diagnosis not present

## 2014-09-01 DIAGNOSIS — Z954 Presence of other heart-valve replacement: Secondary | ICD-10-CM | POA: Diagnosis not present

## 2014-09-01 DIAGNOSIS — Z7901 Long term (current) use of anticoagulants: Secondary | ICD-10-CM | POA: Diagnosis not present

## 2014-09-01 LAB — POCT INR: INR: 2.4

## 2014-09-01 MED ORDER — WARFARIN SODIUM 5 MG PO TABS: ORAL_TABLET | ORAL | Status: DC

## 2014-09-02 LAB — CBC
HCT: 38 % — ABNORMAL LOW (ref 39.0–52.0)
Hemoglobin: 11.6 g/dL — ABNORMAL LOW (ref 13.0–17.0)
MCH: 25.4 pg — ABNORMAL LOW (ref 26.0–34.0)
MCHC: 30.5 g/dL (ref 30.0–36.0)
MCV: 83.3 fL (ref 78.0–100.0)
MPV: 9.9 fL (ref 8.6–12.4)
Platelets: 510 10*3/uL — ABNORMAL HIGH (ref 150–400)
RBC: 4.56 MIL/uL (ref 4.22–5.81)
RDW: 19.6 % — ABNORMAL HIGH (ref 11.5–15.5)
WBC: 4.6 10*3/uL (ref 4.0–10.5)

## 2014-09-05 ENCOUNTER — Other Ambulatory Visit: Payer: Self-pay | Admitting: Pharmacist Clinician (PhC)/ Clinical Pharmacy Specialist

## 2014-09-05 MED ORDER — WARFARIN SODIUM 5 MG PO TABS: ORAL_TABLET | ORAL | Status: DC

## 2014-09-06 ENCOUNTER — Other Ambulatory Visit: Payer: Self-pay | Admitting: Hematology

## 2014-09-06 ENCOUNTER — Telehealth: Payer: Self-pay | Admitting: Cardiology

## 2014-09-06 DIAGNOSIS — D589 Hereditary hemolytic anemia, unspecified: Secondary | ICD-10-CM

## 2014-09-06 NOTE — Telephone Encounter (Signed)
5.9.16 Received FMLA/Disability Forms ([employer]) late in day by [employer].  5.10.16  Forms given to Evette Cristal, RN for Dr Stanford Breed to review, complete and sign.

## 2014-09-07 ENCOUNTER — Telehealth: Payer: Self-pay | Admitting: Hematology

## 2014-09-07 NOTE — Telephone Encounter (Signed)
per pof to sch pt appt-cld & spoke to pt and gave pt appt time & date-pt stated has MY CHART

## 2014-09-15 ENCOUNTER — Ambulatory Visit (INDEPENDENT_AMBULATORY_CARE_PROVIDER_SITE_OTHER): Payer: 59 | Admitting: Pharmacist Clinician (PhC)/ Clinical Pharmacy Specialist

## 2014-09-15 ENCOUNTER — Ambulatory Visit: Payer: 59 | Admitting: Pharmacist Clinician (PhC)/ Clinical Pharmacy Specialist

## 2014-09-15 ENCOUNTER — Encounter (HOSPITAL_COMMUNITY)
Admission: RE | Admit: 2014-09-15 | Discharge: 2014-09-15 | Disposition: A | Discharge status: Home / Self Care | Payer: 59 | Source: Ambulatory Visit | Attending: Cardiology | Admitting: Cardiology

## 2014-09-15 DIAGNOSIS — Z954 Presence of other heart-valve replacement: Secondary | ICD-10-CM | POA: Diagnosis not present

## 2014-09-15 DIAGNOSIS — I059 Rheumatic mitral valve disease, unspecified: Secondary | ICD-10-CM | POA: Diagnosis not present

## 2014-09-15 DIAGNOSIS — Z48812 Encounter for surgical aftercare following surgery on the circulatory system: Secondary | ICD-10-CM | POA: Insufficient documentation

## 2014-09-15 DIAGNOSIS — Z7901 Long term (current) use of anticoagulants: Secondary | ICD-10-CM | POA: Diagnosis not present

## 2014-09-15 DIAGNOSIS — Z952 Presence of prosthetic heart valve: Secondary | ICD-10-CM | POA: Insufficient documentation

## 2014-09-15 LAB — POCT INR: INR: 1.9

## 2014-09-15 NOTE — Telephone Encounter (Signed)
5.17.16 Received signed Attending Physician Statement ([employer]) from Dr Stanford Breed.  Notified patient.  Faxed Signed statement to [employer] 09/13/14-Mailed copy to patient. lp

## 2014-09-15 NOTE — Progress Notes (Signed)
Cardiac Rehab Medication Review by a Pharmacist  Does the patient  feel that his/her medications are working for him/her?  yes  Has the patient been experiencing any side effects to the medications prescribed?  no  Does the patient measure his/her own blood pressure or blood glucose at home?  no   Does the patient have any problems obtaining medications due to transportation or finances?   no  Understanding of regimen: excellent Understanding of indications: good Potential of compliance: excellent    Pharmacist comments: Patient rates understanding of medications as excellent and compliance as excellent. No issues reported.   Elicia Lamp, PharmD Clinical Pharmacist - Resident Pager [phone] 09/15/2014 8:55 AM

## 2014-09-16 ENCOUNTER — Other Ambulatory Visit: Payer: Self-pay | Admitting: Thoracic Surgery (Cardiothoracic Vascular Surgery)

## 2014-09-16 DIAGNOSIS — Z952 Presence of prosthetic heart valve: Secondary | ICD-10-CM

## 2014-09-19 ENCOUNTER — Encounter: Payer: Self-pay | Admitting: Thoracic Surgery (Cardiothoracic Vascular Surgery)

## 2014-09-19 ENCOUNTER — Ambulatory Visit (INDEPENDENT_AMBULATORY_CARE_PROVIDER_SITE_OTHER): Payer: Self-pay | Admitting: Thoracic Surgery (Cardiothoracic Vascular Surgery)

## 2014-09-19 ENCOUNTER — Ambulatory Visit
Admission: RE | Admit: 2014-09-19 | Discharge: 2014-09-19 | Disposition: A | Discharge status: Home / Self Care | Payer: 59 | Source: Ambulatory Visit | Attending: Thoracic Surgery (Cardiothoracic Vascular Surgery) | Admitting: Thoracic Surgery (Cardiothoracic Vascular Surgery)

## 2014-09-19 VITALS — BP 147/95 | HR 104 | Resp 16 | Ht 70.5 in | Wt 157.5 lb

## 2014-09-19 DIAGNOSIS — Z952 Presence of prosthetic heart valve: Secondary | ICD-10-CM

## 2014-09-19 DIAGNOSIS — Z954 Presence of other heart-valve replacement: Secondary | ICD-10-CM

## 2014-09-19 DIAGNOSIS — I340 Nonrheumatic mitral (valve) insufficiency: Secondary | ICD-10-CM

## 2014-09-19 NOTE — Patient Instructions (Addendum)
The patient should continue to avoid any heavy lifting or strenuous use of arms or shoulders for at least a total of three months from the time of surgery.  The patient may return to driving an automobile as long as they are no longer requiring oral narcotic pain relievers during the daytime.  It would be wise to start driving only short distances during the daylight and gradually increase from there as they feel comfortable.  The patient is encouraged to enroll and participate in the outpatient cardiac rehab program beginning as soon as practical.  The patient should check his pulse and blood pressure every morning for the next 1-2 weeks and report results to Dr Stanford Breed when he sees him for follow up

## 2014-09-19 NOTE — Progress Notes (Signed)
[address]       [place] [address]             [phone]     CARDIOTHORACIC SURGERY OFFICE NOTE  Referring Provider is Lelon Perla, MD PCP is Unice Cobble, MD   HPI:  Patient returns for routine follow-up status post redo mitral valve replacement using a mechanical prosthesis on 08/11/2014 for severe symptomatic recurrent primary mitral regurgitation with acute diastolic congestive heart failure and hemolytic anemia.  His early postoperative recovery in the hospital was notable for sinus bradycardia which resolved. For this reason he was not discharged from the hospital on a beta blocker.  He otherwise did very well and was discharged home on the sixth postoperative day. Since then the patient has done exceptionally well. He was seen in follow-up by Dr. Stanford Breed on 08/24/2014 at which time he was making excellent progress. The patient's anemia resolved and follow-up hemoglobin obtained on 09/01/2014 was up to 11.6.  The patient's Coumadin dosing has been monitored through the Coumadin clinic. His most recent INR was subtherapeutic at 1.9. He remains on low-dose aspirin as well.  The patient returns to our office today for routine follow-up. He states that he feels exceptionally well, much better than he has in quite some time. He is eager to go back to work. He has minimal residual soreness in his chest. His energy level is quite good. He has no shortness of breath.   Current Outpatient Prescriptions  Medication Sig Dispense Refill  . ALPRAZolam (XANAX) 0.25 MG tablet Take 1 tablet (0.25 mg total) by mouth every 6 (six) hours as needed for anxiety. 30 tablet 0  . aspirin EC 81 MG EC tablet Take 1 tablet (81 mg total) by mouth daily.    . folic acid (FOLVITE) 1 MG tablet Take 1 tablet (1 mg total) by mouth daily. 30 tablet 3  . loratadine (CLARITIN) 10 MG tablet Take 1 tablet (10 mg total) by mouth daily. 30 tablet 3  . oxyCODONE (OXY IR/ROXICODONE) 5 MG  immediate release tablet Take 1-2 tablets (5-10 mg total) by mouth every 4 (four) hours as needed for severe pain. 40 tablet 0  . potassium chloride SA (K-DUR,KLOR-CON) 20 MEQ tablet Take 2 tablets (40 mEq total) by mouth 2 (two) times daily. (Patient taking differently: Take 20 mEq by mouth daily. ) 30 tablet 2  . warfarin (COUMADIN) 5 MG tablet Take 1 tablet by mouth daily or as directed by coumadin clinic (Patient taking differently: Take 2.5-5 mg by mouth daily at 6 PM. Take 1 tablet by mouth daily or as directed by coumadin clinic) 90 tablet 1   No current facility-administered medications for this visit.      Physical Exam:   BP 147/95 mmHg  Pulse 104  Resp 16  Ht 5' 10.5" (1.791 m)  Wt 157 lb 8 oz (71.442 kg)  BMI 22.27 kg/m2  SpO2 99%  General:  Well-appearing  Chest:   clear  CV:   Regular rate and rhythm with mechanical heart valve sounds  Incisions:  Healing nicely, sternum is stable  Abdomen:  Soft and nontender  Extremities:  Warm and well-perfused  Diagnostic Tests:  CHEST 2 VIEW  COMPARISON: Chest radiograph August 16, 2014 and chest CT Aug 29, 2014  FINDINGS: There has been considerable clearing of patchy infiltrate from the right lower lobe. There is no new lung edema or consolidation. Heart size and pulmonary vascularity are normal. No  adenopathy. Patient is status post mitral valve replacement. No bone lesions.  IMPRESSION: Considerable clearing of patchy infiltrate from the right lower lobe. No new opacity. Patient is status post mitral valve replacement.  The small nodular opacities described on recent CT are not appreciable radiographically. Recommendations made with respect to the nodular opacities seen on recent CT noted in the chest CT report remain in effect.   Electronically Signed  By: Lowella Grip III M.D.   Impression:  Patient is doing very well over 1 month status post redo mitral valve replacement using a mechanical  prosthesis.  Plan:  I have encouraged the patient to continue to gradually increase his physical activity with his only significant limitations at this point remaining that he refrain from heavy lifting or strenuous use of his arms or shoulders for at least another 2 months. I think he may resume driving a normal built and go back to work with some lifting restrictions. I have urged him to participate in the cardiac rehabilitation program. I have suggested that he should keep tabs on his heart rate and blood pressure for the next 1-2 weeks and discuss whether or not he should be placed on a beta blocker when he returns to see Dr. Stanford Breed for follow-up in the near future. We have not made any recommendations to change his medications at this time. All of his questions have been addressed.   Valentina Gu. Roxy Manns, MD 09/19/2014 10:31 AM

## 2014-09-21 ENCOUNTER — Encounter (HOSPITAL_COMMUNITY)
Admission: RE | Admit: 2014-09-21 | Discharge: 2014-09-21 | Disposition: A | Discharge status: Home / Self Care | Payer: 59 | Source: Ambulatory Visit | Attending: Cardiology | Admitting: Cardiology

## 2014-09-21 DIAGNOSIS — Z48812 Encounter for surgical aftercare following surgery on the circulatory system: Secondary | ICD-10-CM | POA: Diagnosis not present

## 2014-09-21 DIAGNOSIS — Z952 Presence of prosthetic heart valve: Secondary | ICD-10-CM | POA: Diagnosis not present

## 2014-09-21 NOTE — Progress Notes (Signed)
Pt started cardiac rehab today in the 6:45 phase II program.  Pt tolerated light exercise without difficulty. VSS, telemetry showed SR/ST with no noted ectopy, asymptomatic.  Medication list reconciled.  Pt verbalized compliance with medications and denies barriers to compliance. PSYCHOSOCIAL ASSESSMENT:  PHQ-0. Pt denies any depression thoughts nor stress.  Pt attributes things falling in line by Gods divine intervention.  Pt has not had to worry about a thing since his surgery.  Pt readily admits this was a much better recovery verses his first valve repair using the "Mini" approach. Pt plans to return back to work today and is eager and feels ready to go back to work.  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 Pt exhibits positive coping skills, hopeful outlook with supportive family. No psychosocial needs identified at this time, no psychosocial interventions necessary.    Pt enjoys working in the garden and on cars.   Pt cardiac rehab short term goal is  To get back to normal increase his strength.  Prior to his redo surgery pt was exercising three times a week with weights and treamill .  Pt encouraged to participate in home exercise on his days he is not attending cardiac rehab.  Home exercise will be reviewed with pt by the exercise specialist to increase ability to achieve these goals.   Pt long term cardiac rehab goal is to get back to normal activities. Will monitor his progress toward achieving these goals.   Pt oriented to exercise equipment and routine.  Understanding verbalized. Cherre Huger, BSN

## 2014-09-22 ENCOUNTER — Ambulatory Visit (INDEPENDENT_AMBULATORY_CARE_PROVIDER_SITE_OTHER): Payer: 59 | Admitting: Pharmacist Clinician (PhC)/ Clinical Pharmacy Specialist

## 2014-09-22 DIAGNOSIS — Z954 Presence of other heart-valve replacement: Secondary | ICD-10-CM | POA: Diagnosis not present

## 2014-09-22 DIAGNOSIS — Z7901 Long term (current) use of anticoagulants: Secondary | ICD-10-CM | POA: Diagnosis not present

## 2014-09-22 DIAGNOSIS — I059 Rheumatic mitral valve disease, unspecified: Secondary | ICD-10-CM | POA: Diagnosis not present

## 2014-09-22 LAB — POCT INR: INR: 1.8

## 2014-09-23 ENCOUNTER — Ambulatory Visit (HOSPITAL_COMMUNITY)
Admission: RE | Admit: 2014-09-23 | Discharge: 2014-09-23 | Disposition: A | Discharge status: Home / Self Care | Payer: 59 | Source: Ambulatory Visit | Attending: Cardiology | Admitting: Cardiology

## 2014-09-23 ENCOUNTER — Encounter (HOSPITAL_COMMUNITY)
Admission: RE | Admit: 2014-09-23 | Discharge: 2014-09-23 | Disposition: A | Discharge status: Home / Self Care | Payer: 59 | Source: Ambulatory Visit | Attending: Cardiology | Admitting: Cardiology

## 2014-09-23 DIAGNOSIS — I059 Rheumatic mitral valve disease, unspecified: Secondary | ICD-10-CM | POA: Insufficient documentation

## 2014-09-23 DIAGNOSIS — Z48812 Encounter for surgical aftercare following surgery on the circulatory system: Secondary | ICD-10-CM | POA: Diagnosis not present

## 2014-09-23 NOTE — Progress Notes (Signed)
2D Echo Complete.  Preliminary Technician Findings:  The EF is mildly reduced, in the 40-45% range.  May still be residual from surgical Mitral Valve Replacement just over 1 Month ago (08-11-14).  Mechanical Mitral Valve seems to be functioning well.    Deliah Boston, RDCS

## 2014-09-26 ENCOUNTER — Encounter (HOSPITAL_COMMUNITY): Payer: 59

## 2014-09-27 ENCOUNTER — Encounter: Payer: Self-pay | Admitting: Hematology

## 2014-09-27 ENCOUNTER — Ambulatory Visit (HOSPITAL_BASED_OUTPATIENT_CLINIC_OR_DEPARTMENT_OTHER): Payer: 59 | Admitting: Hematology

## 2014-09-27 ENCOUNTER — Other Ambulatory Visit (HOSPITAL_BASED_OUTPATIENT_CLINIC_OR_DEPARTMENT_OTHER): Payer: 59

## 2014-09-27 ENCOUNTER — Encounter: Payer: Self-pay | Admitting: Cardiology

## 2014-09-27 VITALS — BP 144/89 | HR 106 | Temp 98.1°F | Resp 18 | Ht 70.5 in | Wt 163.9 lb

## 2014-09-27 DIAGNOSIS — D560 Alpha thalassemia: Secondary | ICD-10-CM

## 2014-09-27 DIAGNOSIS — D589 Hereditary hemolytic anemia, unspecified: Secondary | ICD-10-CM

## 2014-09-27 DIAGNOSIS — C61 Malignant neoplasm of prostate: Secondary | ICD-10-CM

## 2014-09-27 DIAGNOSIS — I340 Nonrheumatic mitral (valve) insufficiency: Secondary | ICD-10-CM | POA: Diagnosis not present

## 2014-09-27 DIAGNOSIS — D599 Acquired hemolytic anemia, unspecified: Secondary | ICD-10-CM | POA: Diagnosis not present

## 2014-09-27 LAB — CBC & DIFF AND RETIC
BASO%: 0.5 % (ref 0.0–2.0)
Basophils Absolute: 0 10*3/uL (ref 0.0–0.1)
EOS%: 3 % (ref 0.0–7.0)
Eosinophils Absolute: 0.1 10*3/uL (ref 0.0–0.5)
HCT: 37.9 % — ABNORMAL LOW (ref 38.4–49.9)
HGB: 12 g/dL — ABNORMAL LOW (ref 13.0–17.1)
Immature Retic Fract: 14.9 % — ABNORMAL HIGH (ref 3.00–10.60)
LYMPH%: 28.8 % (ref 14.0–49.0)
MCH: 25.9 pg — ABNORMAL LOW (ref 27.2–33.4)
MCHC: 31.7 g/dL — ABNORMAL LOW (ref 32.0–36.0)
MCV: 81.9 fL (ref 79.3–98.0)
MONO#: 0.4 10*3/uL (ref 0.1–0.9)
MONO%: 9.8 % (ref 0.0–14.0)
NEUT#: 2.1 10*3/uL (ref 1.5–6.5)
NEUT%: 57.9 % (ref 39.0–75.0)
Platelets: 266 10*3/uL (ref 140–400)
RBC: 4.63 10*6/uL (ref 4.20–5.82)
RDW: 17.2 % — ABNORMAL HIGH (ref 11.0–14.6)
Retic %: 0.87 % (ref 0.80–1.80)
Retic Ct Abs: 40.28 10*3/uL (ref 34.80–93.90)
WBC: 3.7 10*3/uL — ABNORMAL LOW (ref 4.0–10.3)
lymph#: 1.1 10*3/uL (ref 0.9–3.3)

## 2014-09-27 LAB — COMPREHENSIVE METABOLIC PANEL (CC13)
ALT: 19 U/L (ref 0–55)
AST: 25 U/L (ref 5–34)
Albumin: 3.9 g/dL (ref 3.5–5.0)
Alkaline Phosphatase: 76 U/L (ref 40–150)
Anion Gap: 9 mEq/L (ref 3–11)
BUN: 10.7 mg/dL (ref 7.0–26.0)
CO2: 25 mEq/L (ref 22–29)
Calcium: 9 mg/dL (ref 8.4–10.4)
Chloride: 106 mEq/L (ref 98–109)
Creatinine: 1 mg/dL (ref 0.7–1.3)
EGFR: >90 mL/min/{1.73_m2} (ref >90)
Glucose: 85 mg/dl (ref 70–140)
Potassium: 4.1 mEq/L (ref 3.5–5.1)
Sodium: 141 mEq/L (ref 136–145)
Total Bilirubin: 0.4 mg/dL (ref 0.20–1.20)
Total Protein: 7 g/dL (ref 6.4–8.3)

## 2014-09-27 LAB — LACTATE DEHYDROGENASE (CC13): LDH: 481 U/L — ABNORMAL HIGH (ref 125–245)

## 2014-09-27 NOTE — Progress Notes (Signed)
[facility]  Telephone:([phone]) [phone] Fax:([phone]) [place] consult Note   Patient Care Team: Hendricks Limes, MD as PCP - General (Internal Medicine) Thayer Headings, MD as Consulting Physician (Cardiology) [name], MD as Consulting Physician (Urology) 09/27/2014  CHIEF COMPLAINTS:  Follow up hemolytic anemia  ISTORY OF PRESENTING ILLNESS:  David [name] 54 y.o. male with past medical history significant for endocarditis, status post mitral valve repair 2009, hyperlipidemia, GERD, recently diagnosed prostate cancer, is here because of recently diagnosed hemolytic anemia. I first met him when he was admitted to the hospital last week, and he is here for follow-up.  He was hospitalized in 2009 for endocarditis, and underwent module of repair. He had severe anemia during that hospitalization, and he received 3 units of blood transfusion. He recovered well, and has been slightly anemic with hemoglobin around 11-12, the patient. He developed some fatigue, mild night sweats 3 weeks ago, he was evaluated by his primary care physician Dr. [name]. Routine lab test reviewed hemoglobin 5.9, MCV 70.6, normal WAC and a platelet count, he was referred to emergency room and was admitted. He underwent EGD and colonoscopy, which showed mild to gastropathy biopsy was negative. Colonoscopy was normal. He received 2 units of RBC, And was discharged home on 06/07/2014 with hemoglobin 8.7. He followed up with Dr. Jodi Mourning after discharge, labs showed hemoglobin 7.5 on 06/20/2014, and he developed some chest tightness. He presented to emergency room again on 2/23 and was admitted. His hemoglobin was 7.2 in the ER, and dropped to 6.1 this morning after admission, and he received 2 units of blood transfusion today. He does feel much better after blood transfusion.  He reports 2-3 episode of dark urine (tea color) in the past 3 weeks. No significant abdominal or frank pain, no fever or chills,  no skin rash or lesions. he lost about 5lbs in the past month. Good appetite. No neuro symptoms.   He was recently diagnosed with prostate cancer, with Gleason score 6 . This was discovered on screening PSA, and he underwent prostate biopsy. He is in the process being referred to see a urologist.   He denies family history of sickle cell disease or other inheritable anemia, except that his mother and one of his sisters are slightly anemic. He has 6 siblings. He is not sure the etiology of their anemia.   He has been taking Krill in the past one year, and some glucosomine for the past few month, and prostate health (vitamine supplement), no other urbers or supplement.   He was discharged to home on 06/23/2014. He feels well overall and has been back to work.  He noticed one episode of dark urine, no other complaints.  INTERIM HISTORY: David [name] returns for follow-up. He underwent mitral valve replacement with a mechanical prosthesis on 08/11/2014. He tolerated the procedure very well, was discharged home after 8 days of hospital stay. He underwent extensive cardiac rehabilitation afterwards, and has recovered very well. He went back to work last week, and the follows up with his surgeon Dr. Roxy Manns closely. He is on coumadin now, no bleeding.   MEDICAL HISTORY:  Past Medical History  Diagnosis Date  . GERD (gastroesophageal reflux disease)   . Endocarditis 10-1999     RECURRENCE 02-2008  . History of chest tube placement 1980    R SIDE FOR SPONTANEOUS  PNEUMOTHORAX  . Periodontitis     PMH  . C. difficile colitis 2009  . External hemorrhoids  without mention of complication 96/28/3662    Colonoscopy  . History of blood transfusion 02/2008, 04/2014.     for anemia.   . Prostate cancer     "5 of the samples from the biopsy was positive; Greeson # is 6"  . DDD (degenerative disc disease), lumbar   . Anemia 2009, 04/2014.   . Mitral regurgitation 07/19/2014    Recurrent severe mitral regurgitation  s/p mitral valve repair 2009  . Hemolytic anemia 06/29/2014  . S/P redo mitral valve replacement with metallic valve 9/47/6546    31 mm Sorin [employer]omedics Optiform bileaflet mechanical prosthesis    SURGICAL HISTORY: Past Surgical History  Procedure Laterality Date  . Colonoscopy  2005, 03/2012    external rrhoids 2005, normal exam 2013;  GI  . Multiple tooth extractions  2009    "to prep for mitral valve repair"  . Mitral valve repair  2009    via a mini thoracotomy  . Prostate biopsy  03/2014  . Cardiac catheterization  2009  . Esophagogastroduodenoscopy N/A 06/02/2014    Procedure: ESOPHAGOGASTRODUODENOSCOPY (EGD);  Surgeon: Jerene Bears, MD;  Location[facility] ENDOSCOPY;  Service: Endoscopy;  Laterality: N/A;  . Tee without cardioversion N/A 07/19/2014    Procedure: TRANSESOPHAGEAL ECHOCARDIOGRAM (TEE);  Surgeon: Lelon Perla, MD;  Location: [facility] ENDOSCOPY;  Service: Cardiovascular;  Laterality: N/A;  . Left and right heart catheterization with coronary angiogram N/A 08/10/2014    Procedure: LEFT AND RIGHT HEART CATHETERIZATION WITH CORONARY ANGIOGRAM;  Surgeon: Belva Crome, MD;  Location: [facility] CATH LAB;  Service: Cardiovascular;  Laterality: N/A;  . Mitral valve repair N/A 08/11/2014    Procedure: REDO MITRAL VALVE (MV) REPLACEMENT;  Surgeon: Rexene Alberts, MD;  Location: [facility];  Service: Open Heart Surgery;  Laterality: N/A;  NO NECK LINES ON RIGHT SIDE  . Tee without cardioversion N/A 08/11/2014    Procedure: TRANSESOPHAGEAL ECHOCARDIOGRAM (TEE);  Surgeon: Rexene Alberts, MD;  Location: [facility];  Service: Open Heart Surgery;  Laterality: N/A;    SOCIAL HISTORY: History   Social History  . Marital Status: Married    Spouse Name: N/A  . Number of Children: N/A  . Years of Education: N/A   Occupational History  . 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 newspaper delivery     delivers papers to distributors, part time on weekends.    Social History Main  Topics  . Smoking status: Former Smoker -- 2.00 packs/day for 15 years    Types: Cigarettes    Quit date: 08/08/1989  . Smokeless tobacco: Never Used  . Alcohol Use: 0.0 oz/week    0 Standard drinks or equivalent per week     Comment: "stopped drinking in 1991; never treated for abuse"  . Drug Use: Yes    Special: Cocaine, Heroin, Marijuana, denies IV drug      Comment: "stopped using drugs in ~ 1984"  . Sexual Activity: No   Other Topics Concern  . 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 Ovarian cancer Sister   . Diabetes Neg Hx   . Heart disease Neg Hx   . Stroke Neg Hx   . Alzheimer's disease Father     ALLERGIES:  is allergic to clarithromycin.  MEDICATIONS:  Current Outpatient Prescriptions  Medication Sig Dispense Refill  . ALPRAZolam (XANAX) 0.25  MG tablet Take 1 tablet (0.25 mg total) by mouth every 6 (six) hours as needed for anxiety. 30 tablet 0  . aspirin EC 81 MG EC tablet Take 1 tablet (81 mg total) by mouth daily.    . folic acid (FOLVITE) 1 MG tablet Take 1 tablet (1 mg total) by mouth daily. 30 tablet 3  . loratadine (CLARITIN) 10 MG tablet Take 1 tablet (10 mg total) by mouth daily. 30 tablet 3  . oxyCODONE (OXY IR/ROXICODONE) 5 MG immediate release tablet Take 1-2 tablets (5-10 mg total) by mouth every 4 (four) hours as needed for severe pain. 40 tablet 0  . potassium chloride SA (K-DUR,KLOR-CON) 20 MEQ tablet Take 2 tablets (40 mEq total) by mouth 2 (two) times daily. (Patient taking differently: Take 20 mEq by mouth daily. ) 30 tablet 2  . warfarin (COUMADIN) 5 MG tablet Take 1 tablet by mouth daily or as directed by coumadin clinic (Patient taking differently: Take 2.5-5 mg by mouth daily at 6 PM. Take 1 tablet by mouth daily or as directed by coumadin clinic) 90 tablet 1   No current facility-administered medications for this visit.     REVIEW OF SYSTEMS:   Constitutional: Denies fevers, chills or abnormal night sweats Eyes: Denies blurriness of vision, double vision or watery eyes Ears, nose, mouth, throat, and face: Denies mucositis or sore throat Respiratory: Denies cough, dyspnea or wheezes Cardiovascular: Denies palpitation, chest discomfort or lower extremity swelling Gastrointestinal:  Denies nausea, heartburn or change in bowel habits Skin: Denies abnormal skin rashes Lymphatics: Denies new lymphadenopathy or easy bruising Neurological:Denies numbness, tingling or new weaknesses Behavioral/Psych: Mood is stable, no new changes  All other systems were reviewed with the patient and are negative.  PHYSICAL EXAMINATION: ECOG PERFORMANCE STATUS: 0 - Asymptomatic  Filed Vitals:   09/27/14 1400  BP: 144/89  Pulse: 106  Temp: 98.1 F (36.7 C)  Resp: 18   Filed Weights   09/27/14 1400  Weight: 163 lb 14.4 oz (74.345 kg)    GENERAL:alert, no distress and comfortable SKIN: skin color, texture, turgor are normal, no rashes or significant lesions EYES: normal, conjunctiva are pink and non-injected, sclera clear OROPHARYNX:no exudate, no erythema and lips, buccal mucosa, and tongue normal  NECK: supple, thyroid normal size, non-tender, without nodularity LYMPH:  no palpable lymphadenopathy in the cervical, axillary or inguinal LUNGS: clear to auscultation and percussion with normal breathing effort HEART: regular rate & rhythm and no murmurs and no lower extremity edema ABDOMEN:abdomen soft, non-tender and normal bowel sounds Musculoskeletal:no cyanosis of digits and no clubbing  PSYCH: alert & oriented x 3 with fluent speech NEURO: no focal motor/sensory deficits  LABORATORY DATA:  I have reviewed the data as listed CBC Latest Ref Rng 09/27/2014 09/01/2014 08/16/2014  WBC 4.0 - 10.3 10e3/uL 3.7(L) 4.6 7.0  Hemoglobin 13.0 - 17.1 g/dL 12.0(L) 11.6(L) 8.0(L)  Hematocrit 38.4 - 49.9 % 37.9(L) 38.0(L) 25.5(L)   Platelets 140 - 400 10e3/uL 266 510(H) 213    CMP Latest Ref Rng 08/16/2014 08/15/2014 08/14/2014  Glucose 70 - 99 mg/dL 101(H) 110(H) 105(H)  BUN 6 - 23 mg/dL _0 Creatinine 0.50 - 1.35 mg/dL 0.87 0.83 0.98  Sodium 135 - 145 mmol/L 135 133(L) 130(L)  Potassium 3.5 - 5.1 mmol/L 3.6 3.7 3.7  Chloride 96 - 112 mmol/L 103 101 97  CO2 19 - 32 mmol/L _1 Calcium 8.4 - 10.5 mg/dL 8.3(L) 8.0(L) 7.9(L)  Total Protein 6.4 - 8.3  g/dL - - -  Total Bilirubin 0.20 - 1.20 mg/dL - - -  Alkaline Phos 40 - 150 U/L - - -  AST 5 - 34 U/L - - -  ALT 0 - 55 U/L - - -   Lactate dehydrogenase  Status: Finalresult Visible to patient:  Not Released Nextappt: 09/28/2014 at 06:45 AM in Cardiac Rehabilitation ([facility])              Ref Range 1:40 PM  2moago  334mogo     LDH 125 - 245 U/L 481 (H) 3,483 (H) 2,668 (H)   Resulting Agency  [facility]          RADIOGRAPHIC STUDIES: I have personally reviewed the radiological images as listed and agreed with the findings in the report.  Ct chest, Abdomen Pelvis W Contrast 06/22/2014   IMPRESSION: 1. No evidence of lymphadenopathy. 2. 10 mm nodule in the right middle lobe. Given the size of this nodule, biopsy or follow-up PET-CT should be considered. If these not performed, short-term follow-up with repeat CT in 3 months is recommended. 3. Area of hypoattenuation in the pancreatic head that may be artifact from dense adjacent colonic contrast. A mass is not excluded, but felt less likely. This could also be the assessed on short-term followup CT or pancreatic MRI with and without contrast. 4. Mild wall thickening of the bladder. Consider cystitis if there are consistent symptoms. 5. Trace pelvic free fluid, nonspecific.   Electronically Signed   By: DaLajean Manes.D.   On: 06/22/2014 15:49    ASSESSMENT & PLAN:  [age]ear old African-American male, with past medical history of endocarditis in  2001 and 2009 status post mitral valve repair in 2009, recently diagnosed prostate cancer, presented with recurrent severe anemia required hospitalization.   1. Hemolytic Anemia, secondary to severe mitral valve regurgitation, (+) heterozygous alpha thalassemia -His anemia workup showed normal iron and TIBC, slightly low ferritin, normal B-I-15nd folic acid, no evidence of nutritional deficient anemia and GI workup was negative for bleeding.  -He has significant reticulocytosis, right high LDH, slightly elevated indirect bilirubin, low haptoglobin, peripheral smear showed abundant schistocytes, occasional target cells, his presentation is consistent with hemolysis. 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 -His anemia has nearly resolved after his module valve replacement, hemoglobin 12 today, LDH down to 481. He did not require any blood transfusion after his surgery. -OK to stop folic acid after 2 more weeks.  2. Early stage prostate cancer, untreated -He'll continue follow-up with his urologist.  Follow up: His hemolytic anemia has resolved after surgery, I'll only see him as needed in the future. He knows to call me if he has any concerns.   All questions were answered. The patient knows to call the clinic with any problems, questions or concerns. I spent 20 minutes counseling the patient face to face. The total time spent in the appointment was 30 minutes and more than 50% was on counseling.     FeTruitt MerleMD 09/27/2014 2:15 PM

## 2014-09-27 NOTE — Telephone Encounter (Signed)
Pt is returning the nurse's call to get the results from his test that was done on 5/27. Please call back  Thanks

## 2014-09-28 ENCOUNTER — Ambulatory Visit (INDEPENDENT_AMBULATORY_CARE_PROVIDER_SITE_OTHER): Payer: 59 | Admitting: Cardiology

## 2014-09-28 ENCOUNTER — Encounter: Payer: Self-pay | Admitting: Cardiology

## 2014-09-28 ENCOUNTER — Encounter (HOSPITAL_COMMUNITY)
Admission: RE | Admit: 2014-09-28 | Discharge: 2014-09-28 | Disposition: A | Discharge status: Home / Self Care | Payer: 59 | Source: Ambulatory Visit | Attending: Cardiology | Admitting: Cardiology

## 2014-09-28 VITALS — BP 126/78 | HR 100 | Wt 163.0 lb

## 2014-09-28 DIAGNOSIS — Z952 Presence of prosthetic heart valve: Secondary | ICD-10-CM | POA: Insufficient documentation

## 2014-09-28 DIAGNOSIS — Z48812 Encounter for surgical aftercare following surgery on the circulatory system: Secondary | ICD-10-CM | POA: Diagnosis present

## 2014-09-28 DIAGNOSIS — R911 Solitary pulmonary nodule: Secondary | ICD-10-CM | POA: Diagnosis not present

## 2014-09-28 DIAGNOSIS — Z954 Presence of other heart-valve replacement: Secondary | ICD-10-CM | POA: Diagnosis not present

## 2014-09-28 LAB — HAPTOGLOBIN: Haptoglobin: <15 mg/dL — ABNORMAL LOW (ref 43–212)

## 2014-09-28 MED ORDER — METOPROLOL SUCCINATE ER 25 MG PO TB24: 25.0000 mg | ORAL_TABLET | Freq: Every day | ORAL | Status: DC

## 2014-09-28 NOTE — Progress Notes (Signed)
HPI: FU MVR. He has history of endocarditis. In November of 2009 he had mitral valve repair via a mini thoracotomy. Recently seen for hemolytic anemia. Transesophageal echocardiogram 3/16 revealed normal LV function, s/p MV repair with severe mitral regurgitation directed posteriorly and laterally possibly from perforated posterior mitral valve leaflet. There was a smaller second jet as well.Cath 4/16 showed EF 55, severe MR, severe pulm HTN, 40 LAD. Pt had MVR (bileaflet mechanical prosthesis) 4/16. Echocardiogram May 2016 showed mildly reduced LV function, mechanical mitral valve with no significant mitral regurgitation and mean gradient 6 mmHg. There was mild left atrial enlargement. There was mild to moderate pulmonic insufficiency. Follow-up CT May 2016 showed stable right upper lobe, right lower lobe and left upper lobe nodules. Follow-up recommended in 6 months.Since last seen, the patient denies any dyspnea on exertion, orthopnea, PND, pedal edema, palpitations, syncope or chest pain.   Current Outpatient Prescriptions  Medication Sig Dispense Refill  . ALPRAZolam (XANAX) 0.25 MG tablet Take 1 tablet (0.25 mg total) by mouth every 6 (six) hours as needed for anxiety. 30 tablet 0  . aspirin EC 81 MG EC tablet Take 1 tablet (81 mg total) by mouth daily.    . folic acid (FOLVITE) 1 MG tablet Take 1 tablet (1 mg total) by mouth daily. 30 tablet 3  . loratadine (CLARITIN) 10 MG tablet Take 1 tablet (10 mg total) by mouth daily. 30 tablet 3  . oxyCODONE (OXY IR/ROXICODONE) 5 MG immediate release tablet Take 1-2 tablets (5-10 mg total) by mouth every 4 (four) hours as needed for severe pain. 40 tablet 0  . potassium chloride SA (K-DUR,KLOR-CON) 20 MEQ tablet Take 2 tablets (40 mEq total) by mouth 2 (two) times daily. (Patient taking differently: Take 20 mEq by mouth daily. ) 30 tablet 2  . warfarin (COUMADIN) 5 MG tablet Take 1 tablet by mouth daily or as directed by coumadin clinic (Patient  taking differently: Take 2.5-5 mg by mouth daily at 6 PM. Take 1 tablet by mouth daily or as directed by coumadin clinic) 90 tablet 1   No current facility-administered medications for this visit.     Past Medical History  Diagnosis Date  . GERD (gastroesophageal reflux disease)   . Endocarditis 10-1999     RECURRENCE 02-2008  . History of chest tube placement 1980    R SIDE FOR SPONTANEOUS  PNEUMOTHORAX  . Periodontitis     PMH  . C. difficile colitis 2009  . External hemorrhoids without mention of complication 92/02/9416    Colonoscopy  . History of blood transfusion 02/2008, 04/2014.     for anemia.   . Prostate cancer     "5 of the samples from the biopsy was positive; Greeson # is 6"  . DDD (degenerative disc disease), lumbar   . Anemia 2009, 04/2014.   . Mitral regurgitation 07/19/2014    Recurrent severe mitral regurgitation s/p mitral valve repair 2009  . Hemolytic anemia 06/29/2014  . S/P redo mitral valve replacement with metallic valve 08/05/1446    31 mm Sorin Carbomedics Optiform bileaflet mechanical prosthesis    Past Surgical History  Procedure Laterality Date  . Colonoscopy  2005, 03/2012    external rrhoids 2005, normal exam 2013; [facility]  . Multiple tooth extractions  2009    "to prep for mitral valve repair"  . Mitral valve repair  2009    via a mini thoracotomy  . Prostate biopsy  03/2014  . Cardiac  catheterization  2009  . Esophagogastroduodenoscopy N/A 06/02/2014    Procedure: ESOPHAGOGASTRODUODENOSCOPY (EGD);  Surgeon: Jerene Bears, MD;  Location: [facility] ENDOSCOPY;  Service: Endoscopy;  Laterality: N/A;  . Tee without cardioversion N/A 07/19/2014    Procedure: TRANSESOPHAGEAL ECHOCARDIOGRAM (TEE);  Surgeon: Lelon Perla, MD;  Location: [facility] ENDOSCOPY;  Service: Cardiovascular;  Laterality: N/A;  . Left and right heart catheterization with coronary angiogram N/A 08/10/2014    Procedure: LEFT AND RIGHT HEART CATHETERIZATION WITH CORONARY ANGIOGRAM;  Surgeon:  Belva Crome, MD;  Location[facility] CATH LAB;  Service: Cardiovascular;  Laterality: N/A;  . Mitral valve repair N/A 08/11/2014    Procedure: REDO MITRAL VALVE (MV) REPLACEMENT;  Surgeon: Rexene Alberts, MD;  Location: [place];  Service: Open Heart Surgery;  Laterality: N/A;  NO NECK LINES ON RIGHT SIDE  . Tee without cardioversion N/A 08/11/2014    Procedure: TRANSESOPHAGEAL ECHOCARDIOGRAM (TEE);  Surgeon: Rexene Alberts, MD;  Location: [place];  Service: Open Heart Surgery;  Laterality: N/A;    History   Social History  . Marital Status: Married    Spouse Name: N/A  . Number of Children: N/A  . Years of Education: N/A   Occupational History  . 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 newspaper delivery     delivers papers to distributors, part time on weekends.    Social History Main Topics  . Smoking status: Former Smoker -- 2.00 packs/day for 15 years    Types: Cigarettes    Quit date: 08/08/1989  . Smokeless tobacco: Never Used  . Alcohol Use: No     Comment: "stopped drinking in 1991; never treated for abuse"  . Drug Use: No     Comment: "stopped using drugs in ~ 1984"  . Sexual Activity: No   Other Topics Concern  . Not on file   Social History Narrative   REGULAR EXERCISE 2X WK TREADMILL,WEIGHTS    ROS: no fevers or chills, productive cough, hemoptysis, dysphasia, odynophagia, melena, hematochezia, dysuria, hematuria, rash, seizure activity, orthopnea, PND, pedal edema, claudication. Remaining systems are negative.  Physical Exam: Well-developed well-nourished in no acute distress.  Skin is warm and dry.  HEENT is normal.  Neck is supple.  Chest is clear to auscultation with normal expansion.  Cardiovascular exam is regular rate and rhythm. Crisp mechanical valve sounds Abdominal exam nontender or distended. No masses palpated. Extremities show no edema. neuro grossly intact

## 2014-09-28 NOTE — Telephone Encounter (Signed)
This encounter was created in error - please disregard.

## 2014-09-28 NOTE — Patient Instructions (Signed)
Your physician wants you to follow-up in: David [name] will receive a reminder letter in the mail two months in advance. If you don't receive a letter, please call our office to schedule the follow-up appointment.   START METOPROLOL SUCC ER 25 MG ONCE DAILY AT BEDTIME

## 2014-09-28 NOTE — Assessment & Plan Note (Signed)
Repeat chest CT November 2016.

## 2014-09-28 NOTE — Assessment & Plan Note (Signed)
Continue Coumadin and aspirin. Continue SBE prophylaxis. Note his heart rate has been running approximately 100. His LV function is mildly reduced on echo. Add Toprol 25 mg daily.

## 2014-09-29 ENCOUNTER — Ambulatory Visit (INDEPENDENT_AMBULATORY_CARE_PROVIDER_SITE_OTHER): Payer: 59 | Admitting: Pharmacist Clinician (PhC)/ Clinical Pharmacy Specialist

## 2014-09-29 DIAGNOSIS — I059 Rheumatic mitral valve disease, unspecified: Secondary | ICD-10-CM | POA: Diagnosis not present

## 2014-09-29 DIAGNOSIS — Z954 Presence of other heart-valve replacement: Secondary | ICD-10-CM

## 2014-09-29 DIAGNOSIS — Z7901 Long term (current) use of anticoagulants: Secondary | ICD-10-CM

## 2014-09-29 LAB — POCT INR: INR: 1.8

## 2014-09-30 ENCOUNTER — Encounter (HOSPITAL_COMMUNITY)
Admission: RE | Admit: 2014-09-30 | Discharge: 2014-09-30 | Disposition: A | Discharge status: Home / Self Care | Payer: 59 | Source: Ambulatory Visit | Attending: Cardiology | Admitting: Cardiology

## 2014-09-30 DIAGNOSIS — Z48812 Encounter for surgical aftercare following surgery on the circulatory system: Secondary | ICD-10-CM | POA: Diagnosis not present

## 2014-10-03 ENCOUNTER — Encounter (HOSPITAL_COMMUNITY)
Admission: RE | Admit: 2014-10-03 | Discharge: 2014-10-03 | Disposition: A | Discharge status: Home / Self Care | Payer: 59 | Source: Ambulatory Visit | Attending: Cardiology | Admitting: Cardiology

## 2014-10-03 DIAGNOSIS — Z48812 Encounter for surgical aftercare following surgery on the circulatory system: Secondary | ICD-10-CM | POA: Diagnosis not present

## 2014-10-05 ENCOUNTER — Encounter (HOSPITAL_COMMUNITY)
Admission: RE | Admit: 2014-10-05 | Discharge: 2014-10-05 | Disposition: A | Discharge status: Home / Self Care | Payer: 59 | Source: Ambulatory Visit | Attending: Cardiology | Admitting: Cardiology

## 2014-10-05 DIAGNOSIS — Z48812 Encounter for surgical aftercare following surgery on the circulatory system: Secondary | ICD-10-CM | POA: Diagnosis not present

## 2014-10-05 NOTE — Progress Notes (Signed)
QUALITY OF LIFE SCORE REVIEW Patient score well above the score of 21.Scores less than 21 are considered low.  Pt scored overall 24.50, Health and function 23.13, Socioeconomic 23.14, Physical and Spiritual 29.14 and Family 24.0. Pt denies any needs no further intervention is required.  Will continue to monitor pt and reassess his overall mental well being. Cherre Huger, BSN

## 2014-10-06 ENCOUNTER — Ambulatory Visit (INDEPENDENT_AMBULATORY_CARE_PROVIDER_SITE_OTHER): Payer: 59 | Admitting: Pharmacist Clinician (PhC)/ Clinical Pharmacy Specialist

## 2014-10-06 DIAGNOSIS — Z7901 Long term (current) use of anticoagulants: Secondary | ICD-10-CM

## 2014-10-06 DIAGNOSIS — I059 Rheumatic mitral valve disease, unspecified: Secondary | ICD-10-CM

## 2014-10-06 DIAGNOSIS — Z954 Presence of other heart-valve replacement: Secondary | ICD-10-CM | POA: Diagnosis not present

## 2014-10-06 LAB — POCT INR: INR: 3.7

## 2014-10-07 ENCOUNTER — Encounter (HOSPITAL_COMMUNITY)
Admission: RE | Admit: 2014-10-07 | Discharge: 2014-10-07 | Disposition: A | Discharge status: Home / Self Care | Payer: 59 | Source: Ambulatory Visit | Attending: Cardiology | Admitting: Cardiology

## 2014-10-07 DIAGNOSIS — Z48812 Encounter for surgical aftercare following surgery on the circulatory system: Secondary | ICD-10-CM | POA: Diagnosis not present

## 2014-10-07 NOTE — Progress Notes (Signed)
Reviewed home exercise with pt today.  Pt plans to walk and go to gym at work or use recreation center behind house for exercise. Goal is to exercise 2-3 days in addition to cardiac rehab sessions. Reviewed THR, pulse, RPE, sign and symptoms, and when to call 911 or MD.  Pt voiced understanding.     [employer]

## 2014-10-10 ENCOUNTER — Encounter (HOSPITAL_COMMUNITY)
Admission: RE | Admit: 2014-10-10 | Discharge: 2014-10-10 | Disposition: A | Discharge status: Home / Self Care | Payer: 59 | Source: Ambulatory Visit | Attending: Cardiology | Admitting: Cardiology

## 2014-10-10 DIAGNOSIS — Z48812 Encounter for surgical aftercare following surgery on the circulatory system: Secondary | ICD-10-CM | POA: Diagnosis not present

## 2014-10-12 ENCOUNTER — Ambulatory Visit (INDEPENDENT_AMBULATORY_CARE_PROVIDER_SITE_OTHER): Payer: 59 | Admitting: Pharmacist Clinician (PhC)/ Clinical Pharmacy Specialist

## 2014-10-12 ENCOUNTER — Encounter (HOSPITAL_COMMUNITY)
Admission: RE | Admit: 2014-10-12 | Discharge: 2014-10-12 | Disposition: A | Discharge status: Home / Self Care | Payer: 59 | Source: Ambulatory Visit | Attending: Cardiology | Admitting: Cardiology

## 2014-10-12 DIAGNOSIS — Z7901 Long term (current) use of anticoagulants: Secondary | ICD-10-CM

## 2014-10-12 DIAGNOSIS — Z954 Presence of other heart-valve replacement: Secondary | ICD-10-CM | POA: Diagnosis not present

## 2014-10-12 DIAGNOSIS — Z48812 Encounter for surgical aftercare following surgery on the circulatory system: Secondary | ICD-10-CM | POA: Diagnosis not present

## 2014-10-12 DIAGNOSIS — I059 Rheumatic mitral valve disease, unspecified: Secondary | ICD-10-CM

## 2014-10-12 LAB — POCT INR: INR: 2.8

## 2014-10-14 ENCOUNTER — Encounter (HOSPITAL_COMMUNITY)
Admission: RE | Admit: 2014-10-14 | Discharge: 2014-10-14 | Disposition: A | Discharge status: Home / Self Care | Payer: 59 | Source: Ambulatory Visit | Attending: Cardiology | Admitting: Cardiology

## 2014-10-14 DIAGNOSIS — Z48812 Encounter for surgical aftercare following surgery on the circulatory system: Secondary | ICD-10-CM | POA: Diagnosis not present

## 2014-10-17 ENCOUNTER — Telehealth: Payer: Self-pay | Admitting: Cardiology

## 2014-10-17 ENCOUNTER — Encounter (HOSPITAL_COMMUNITY)
Admission: RE | Admit: 2014-10-17 | Discharge: 2014-10-17 | Disposition: A | Discharge status: Home / Self Care | Payer: 59 | Source: Ambulatory Visit | Attending: Cardiology | Admitting: Cardiology

## 2014-10-17 DIAGNOSIS — Z48812 Encounter for surgical aftercare following surgery on the circulatory system: Secondary | ICD-10-CM | POA: Diagnosis not present

## 2014-10-17 NOTE — Telephone Encounter (Signed)
Pt wants to know if you received more paper work from his disability?They were suppose to be sending more,said they need more information.

## 2014-10-17 NOTE — Telephone Encounter (Signed)
Staff message sent to Fredia Beets RN concerning this pt.s disability papers

## 2014-10-19 ENCOUNTER — Ambulatory Visit (INDEPENDENT_AMBULATORY_CARE_PROVIDER_SITE_OTHER): Payer: 59 | Admitting: Pharmacist

## 2014-10-19 ENCOUNTER — Encounter (HOSPITAL_COMMUNITY)
Admission: RE | Admit: 2014-10-19 | Discharge: 2014-10-19 | Disposition: A | Discharge status: Home / Self Care | Payer: 59 | Source: Ambulatory Visit | Attending: Cardiology | Admitting: Cardiology

## 2014-10-19 DIAGNOSIS — Z954 Presence of other heart-valve replacement: Secondary | ICD-10-CM

## 2014-10-19 DIAGNOSIS — Z7901 Long term (current) use of anticoagulants: Secondary | ICD-10-CM

## 2014-10-19 DIAGNOSIS — I059 Rheumatic mitral valve disease, unspecified: Secondary | ICD-10-CM

## 2014-10-19 DIAGNOSIS — Z48812 Encounter for surgical aftercare following surgery on the circulatory system: Secondary | ICD-10-CM | POA: Diagnosis not present

## 2014-10-19 LAB — POCT INR: INR: 3.4

## 2014-10-19 NOTE — Progress Notes (Signed)
David [name] 54 y.o. male Nutrition Note Spoke with pt. Nutrition Survey reviewed with pt. Pt is not currently following the Therapeutic Lifestyle Changes diet. Pt reports he is making some dietary changes especially regarding salt. Pt expressed understanding of the information reviewed. Pt aware of nutrition education classes offered. Lab Results  Component Value Date   HGBA1C 5.3 08/10/2014   Wt Readings from Last 3 Encounters:  09/28/14 163 lb (73.936 kg)  09/27/14 163 lb 14.4 oz (74.345 kg)  09/19/14 157 lb 8 oz (71.442 kg)   Nutrition Diagnosis ? Food-and nutrition-related knowledge deficit related to lack of exposure to information as related to diagnosis of: ? CVD  Nutrition Intervention ? Benefits of adopting Therapeutic Lifestyle Changes discussed when Medficts reviewed. ? Pt to attend the Portion Distortion class ? Pt to attend the  ? Nutrition I class                      ? Nutrition II class ? Pt given handouts for: ? Nutrition I class ? Nutrition II class ? Continue client-centered nutrition education by RD, as part of interdisciplinary care.  Goal(s) ? Pt to identify and limit food sources of saturated fat, trans fat, and cholesterol  Monitor and Evaluate progress toward nutrition goal with team.  Derek Mound, M.Ed, RD, LDN, CDE 10/19/2014 8:23 AM

## 2014-10-21 ENCOUNTER — Encounter (HOSPITAL_COMMUNITY)
Admission: RE | Admit: 2014-10-21 | Discharge: 2014-10-21 | Disposition: A | Discharge status: Home / Self Care | Payer: 59 | Source: Ambulatory Visit | Attending: Cardiology | Admitting: Cardiology

## 2014-10-21 DIAGNOSIS — Z48812 Encounter for surgical aftercare following surgery on the circulatory system: Secondary | ICD-10-CM | POA: Diagnosis not present

## 2014-10-21 NOTE — Progress Notes (Signed)
  30 day Psychosocial followup assessment  Patient psychosocial assessment reveals no barriers to cardiac rehab participation.  Patient maintains good attendance to exercise and some to education classes.  Pt work schedule prevents him from staying for the education class.  Pt observed interacting and supporting other participants in the class.  Pt denies any further needs and demonstrates healthy and positive coping skills with supportive family. Cherre Huger, BSN

## 2014-10-24 ENCOUNTER — Encounter (HOSPITAL_COMMUNITY)
Admission: RE | Admit: 2014-10-24 | Discharge: 2014-10-24 | Disposition: A | Discharge status: Home / Self Care | Payer: 59 | Source: Ambulatory Visit | Attending: Cardiology | Admitting: Cardiology

## 2014-10-24 DIAGNOSIS — Z48812 Encounter for surgical aftercare following surgery on the circulatory system: Secondary | ICD-10-CM | POA: Diagnosis not present

## 2014-10-26 ENCOUNTER — Encounter (HOSPITAL_COMMUNITY)
Admission: RE | Admit: 2014-10-26 | Discharge: 2014-10-26 | Disposition: A | Discharge status: Home / Self Care | Payer: 59 | Source: Ambulatory Visit | Attending: Cardiology | Admitting: Cardiology

## 2014-10-26 DIAGNOSIS — Z48812 Encounter for surgical aftercare following surgery on the circulatory system: Secondary | ICD-10-CM | POA: Diagnosis not present

## 2014-10-27 ENCOUNTER — Telehealth: Payer: Self-pay | Admitting: Cardiology

## 2014-10-27 NOTE — Telephone Encounter (Signed)
Spoke with pt, paperwork signed today and faxed to [phone].

## 2014-10-27 NOTE — Telephone Encounter (Signed)
Pt is calling in to check the status of his Short term disability form that was brought to the office last week. Please f/u with the pt   Thanks

## 2014-10-28 ENCOUNTER — Encounter (HOSPITAL_COMMUNITY)
Admission: RE | Admit: 2014-10-28 | Discharge: 2014-10-28 | Disposition: A | Discharge status: Home / Self Care | Payer: 59 | Source: Ambulatory Visit | Attending: Cardiology | Admitting: Cardiology

## 2014-10-28 DIAGNOSIS — Z952 Presence of prosthetic heart valve: Secondary | ICD-10-CM | POA: Insufficient documentation

## 2014-10-28 DIAGNOSIS — Z48812 Encounter for surgical aftercare following surgery on the circulatory system: Secondary | ICD-10-CM | POA: Insufficient documentation

## 2014-11-02 ENCOUNTER — Encounter (HOSPITAL_COMMUNITY): Admission: RE | Admit: 2014-11-02 | Payer: 59 | Source: Ambulatory Visit

## 2014-11-02 ENCOUNTER — Ambulatory Visit: Payer: 59 | Admitting: Pharmacist Clinician (PhC)/ Clinical Pharmacy Specialist

## 2014-11-04 ENCOUNTER — Ambulatory Visit (INDEPENDENT_AMBULATORY_CARE_PROVIDER_SITE_OTHER): Payer: 59 | Admitting: Pharmacist Clinician (PhC)/ Clinical Pharmacy Specialist

## 2014-11-04 ENCOUNTER — Encounter (HOSPITAL_COMMUNITY)
Admission: RE | Admit: 2014-11-04 | Discharge: 2014-11-04 | Disposition: A | Discharge status: Home / Self Care | Payer: 59 | Source: Ambulatory Visit | Attending: Cardiology | Admitting: Cardiology

## 2014-11-04 ENCOUNTER — Telehealth: Payer: Self-pay | Admitting: Cardiology

## 2014-11-04 DIAGNOSIS — Z954 Presence of other heart-valve replacement: Secondary | ICD-10-CM

## 2014-11-04 DIAGNOSIS — I059 Rheumatic mitral valve disease, unspecified: Secondary | ICD-10-CM | POA: Diagnosis not present

## 2014-11-04 DIAGNOSIS — Z7901 Long term (current) use of anticoagulants: Secondary | ICD-10-CM | POA: Diagnosis not present

## 2014-11-04 DIAGNOSIS — Z48812 Encounter for surgical aftercare following surgery on the circulatory system: Secondary | ICD-10-CM | POA: Diagnosis not present

## 2014-11-04 LAB — POCT INR: INR: 1.8

## 2014-11-07 ENCOUNTER — Encounter (HOSPITAL_COMMUNITY)
Admission: RE | Admit: 2014-11-07 | Discharge: 2014-11-07 | Disposition: A | Discharge status: Home / Self Care | Payer: 59 | Source: Ambulatory Visit | Attending: Cardiology | Admitting: Cardiology

## 2014-11-07 DIAGNOSIS — Z48812 Encounter for surgical aftercare following surgery on the circulatory system: Secondary | ICD-10-CM | POA: Diagnosis not present

## 2014-11-07 NOTE — Telephone Encounter (Signed)
Closed encounter °

## 2014-11-09 ENCOUNTER — Encounter (HOSPITAL_COMMUNITY)
Admission: RE | Admit: 2014-11-09 | Discharge: 2014-11-09 | Disposition: A | Discharge status: Home / Self Care | Payer: 59 | Source: Ambulatory Visit | Attending: Cardiology | Admitting: Cardiology

## 2014-11-09 DIAGNOSIS — Z48812 Encounter for surgical aftercare following surgery on the circulatory system: Secondary | ICD-10-CM | POA: Diagnosis not present

## 2014-11-11 ENCOUNTER — Encounter (HOSPITAL_COMMUNITY)
Admission: RE | Admit: 2014-11-11 | Discharge: 2014-11-11 | Disposition: A | Discharge status: Home / Self Care | Payer: 59 | Source: Ambulatory Visit | Attending: Cardiology | Admitting: Cardiology

## 2014-11-11 DIAGNOSIS — Z48812 Encounter for surgical aftercare following surgery on the circulatory system: Secondary | ICD-10-CM | POA: Diagnosis not present

## 2014-11-14 ENCOUNTER — Encounter (HOSPITAL_COMMUNITY)
Admission: RE | Admit: 2014-11-14 | Discharge: 2014-11-14 | Disposition: A | Discharge status: Home / Self Care | Payer: 59 | Source: Ambulatory Visit | Attending: Cardiology | Admitting: Cardiology

## 2014-11-14 ENCOUNTER — Telehealth: Payer: Self-pay | Admitting: *Deleted

## 2014-11-14 DIAGNOSIS — Z48812 Encounter for surgical aftercare following surgery on the circulatory system: Secondary | ICD-10-CM | POA: Diagnosis not present

## 2014-11-14 NOTE — Telephone Encounter (Signed)
Patient needing clearance to hold coumadin for prostate biopsy. Per dr Stanford Breed, pt will need a lovenox bridge

## 2014-11-16 ENCOUNTER — Encounter (HOSPITAL_COMMUNITY)
Admission: RE | Admit: 2014-11-16 | Discharge: 2014-11-16 | Disposition: A | Discharge status: Home / Self Care | Payer: 59 | Source: Ambulatory Visit | Attending: Cardiology | Admitting: Cardiology

## 2014-11-16 ENCOUNTER — Other Ambulatory Visit: Payer: Self-pay | Admitting: Hematology

## 2014-11-16 ENCOUNTER — Ambulatory Visit (INDEPENDENT_AMBULATORY_CARE_PROVIDER_SITE_OTHER): Payer: 59 | Admitting: Pharmacist Clinician (PhC)/ Clinical Pharmacy Specialist

## 2014-11-16 DIAGNOSIS — Z48812 Encounter for surgical aftercare following surgery on the circulatory system: Secondary | ICD-10-CM | POA: Diagnosis not present

## 2014-11-16 DIAGNOSIS — Z954 Presence of other heart-valve replacement: Secondary | ICD-10-CM

## 2014-11-16 DIAGNOSIS — I059 Rheumatic mitral valve disease, unspecified: Secondary | ICD-10-CM

## 2014-11-16 DIAGNOSIS — Z7901 Long term (current) use of anticoagulants: Secondary | ICD-10-CM

## 2014-11-16 LAB — POCT INR: INR: 3.2

## 2014-11-17 ENCOUNTER — Telehealth: Payer: Self-pay | Admitting: *Deleted

## 2014-11-17 NOTE — Telephone Encounter (Signed)
Notified pt per Dr Burr Medico to start MVI with folic acid OTC.  Script for folic acid denied to pharmacy.  Pt expressed understanding.

## 2014-11-18 ENCOUNTER — Encounter (HOSPITAL_COMMUNITY)
Admission: RE | Admit: 2014-11-18 | Discharge: 2014-11-18 | Disposition: A | Discharge status: Home / Self Care | Payer: 59 | Source: Ambulatory Visit | Attending: Cardiology | Admitting: Cardiology

## 2014-11-18 DIAGNOSIS — Z48812 Encounter for surgical aftercare following surgery on the circulatory system: Secondary | ICD-10-CM | POA: Diagnosis not present

## 2014-11-18 NOTE — Progress Notes (Addendum)
60 day Psychosocial Assessment Patient psychosocial assessment reveals no barriers to cardiac rehab participation. Patient maintains good attendance to exercise and some to education classes. Pt works full time with no complaints. Pt observed interacting and supporting other participants in the class. Pt denies any further needs and demonstrates healthy and positive coping skills with supportive family. Cherre Huger, BSN

## 2014-11-21 ENCOUNTER — Encounter (HOSPITAL_COMMUNITY)
Admission: RE | Admit: 2014-11-21 | Discharge: 2014-11-21 | Disposition: A | Discharge status: Home / Self Care | Payer: 59 | Source: Ambulatory Visit | Attending: Cardiology | Admitting: Cardiology

## 2014-11-21 DIAGNOSIS — Z48812 Encounter for surgical aftercare following surgery on the circulatory system: Secondary | ICD-10-CM | POA: Diagnosis not present

## 2014-11-21 NOTE — Progress Notes (Signed)
HPI: FU MVR. He has history of endocarditis. In November of 2009 he had mitral valve repair via a mini thoracotomy. Recently seen for hemolytic anemia. Transesophageal echocardiogram 3/16 revealed normal LV function, s/p MV repair with severe mitral regurgitation directed posteriorly and laterally possibly from perforated posterior mitral valve leaflet. There was a smaller second jet as well.Cath 4/16 showed EF 55, severe MR, severe pulm HTN, 40 LAD. Pt had MVR (bileaflet mechanical prosthesis) 4/16. Echocardiogram May 2016 showed mildly reduced LV function, mechanical mitral valve with no significant mitral regurgitation and mean gradient 6 mmHg. There was mild left atrial enlargement. There was mild to moderate pulmonic insufficiency. Follow-up CT May 2016 showed stable right upper lobe, right lower lobe and left upper lobe nodules. Follow-up recommended in 6 months.Since last seen, the patient denies any dyspnea on exertion, orthopnea, PND, pedal edema, palpitations, syncope or chest pain.   Current Outpatient Prescriptions  Medication Sig Dispense Refill  . aspirin EC 81 MG EC tablet Take 1 tablet (81 mg total) by mouth daily.    . folic acid (FOLVITE) 1 MG tablet Take 1 tablet (1 mg total) by mouth daily. 30 tablet 3  . loratadine (CLARITIN) 10 MG tablet Take 1 tablet (10 mg total) by mouth daily. 30 tablet 3  . metoprolol succinate (TOPROL XL) 25 MG 24 hr tablet Take 1 tablet (25 mg total) by mouth daily. 90 tablet 3  . potassium chloride SA (K-DUR,KLOR-CON) 20 MEQ tablet Take 2 tablets (40 mEq total) by mouth 2 (two) times daily. (Patient taking differently: Take 20 mEq by mouth daily. ) 30 tablet 2  . warfarin (COUMADIN) 5 MG tablet Take 1 tablet by mouth daily or as directed by coumadin clinic (Patient taking differently: Take 2.5-5 mg by mouth daily at 6 PM. Take 1 tablet by mouth daily or as directed by coumadin clinic) 90 tablet 1   No current facility-administered medications for  this visit.     Past Medical History  Diagnosis Date  . GERD (gastroesophageal reflux disease)   . Endocarditis 10-1999     RECURRENCE 02-2008  . History of chest tube placement 1980    R SIDE FOR SPONTANEOUS  PNEUMOTHORAX  . Periodontitis     PMH  . C. difficile colitis 2009  . External hemorrhoids without mention of complication 24/12/7351    Colonoscopy  . History of blood transfusion 02/2008, 04/2014.     for anemia.   . Prostate cancer     "5 of the samples from the biopsy was positive; Greeson # is 6"  . DDD (degenerative disc disease), lumbar   . Anemia 2009, 04/2014.   . Mitral regurgitation 07/19/2014    Recurrent severe mitral regurgitation s/p mitral valve repair 2009  . Hemolytic anemia 06/29/2014  . S/P redo mitral valve replacement with metallic valve 2/99/2426    31 mm Sorin Carbomedics Optiform bileaflet mechanical prosthesis    Past Surgical History  Procedure Laterality Date  . Colonoscopy  2005, 03/2012    external rrhoids 2005, normal exam 2013; [facility]  . Multiple tooth extractions  2009    "to prep for mitral valve repair"  . Mitral valve repair  2009    via a mini thoracotomy  . Prostate biopsy  03/2014  . Cardiac catheterization  2009  . Esophagogastroduodenoscopy N/A 06/02/2014    Procedure: ESOPHAGOGASTRODUODENOSCOPY (EGD);  Surgeon: Jerene Bears, MD;  Location: [facility] ENDOSCOPY;  Service: Endoscopy;  Laterality: N/A;  . Darden Dates  without cardioversion N/A 07/19/2014    Procedure: TRANSESOPHAGEAL ECHOCARDIOGRAM (TEE);  Surgeon: Lelon Perla, MD;  Location: [facility] ENDOSCOPY;  Service: Cardiovascular;  Laterality: N/A;  . Left and right heart catheterization with coronary angiogram N/A 08/10/2014    Procedure: LEFT AND RIGHT HEART CATHETERIZATION WITH CORONARY ANGIOGRAM;  Surgeon: Belva Crome, MD;  Location: [facility] CATH LAB;  Service: Cardiovascular;  Laterality: N/A;  . Mitral valve repair N/A 08/11/2014    Procedure: REDO MITRAL VALVE (MV) REPLACEMENT;  Surgeon:  Rexene Alberts, MD;  Location: [place];  Service: Open Heart Surgery;  Laterality: N/A;  NO NECK LINES ON RIGHT SIDE  . Tee without cardioversion N/A 08/11/2014    Procedure: TRANSESOPHAGEAL ECHOCARDIOGRAM (TEE);  Surgeon: Rexene Alberts, MD;  Location: [place];  Service: Open Heart Surgery;  Laterality: N/A;    History   Social History  . Marital Status: Married    Spouse Name: N/A  . Number of Children: N/A  . Years of Education: N/A   Occupational History  . 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 newspaper delivery     delivers papers to distributors, part time on weekends.    Social History Main Topics  . Smoking status: Former Smoker -- 2.00 packs/day for 15 years    Types: Cigarettes    Quit date: 08/08/1989  . Smokeless tobacco: Never Used  . Alcohol Use: No     Comment: "stopped drinking in 1991; never treated for abuse"  . Drug Use: No     Comment: "stopped using drugs in ~ 1984"  . Sexual Activity: No   Other Topics Concern  . Not on file   Social History Narrative   REGULAR EXERCISE 2X WK TREADMILL,WEIGHTS    ROS: no fevers or chills, productive cough, hemoptysis, dysphasia, odynophagia, melena, hematochezia, dysuria, hematuria, rash, seizure activity, orthopnea, PND, pedal edema, claudication. Remaining systems are negative.  Physical Exam: Well-developed well-nourished in no acute distress.  Skin is warm and dry.  HEENT is normal.  Neck is supple.  Chest is clear to auscultation with normal expansion.  Cardiovascular exam is regular rate and rhythm. Crisp mechanical valve sound Abdominal exam nontender or distended. No masses palpated. Extremities show no edema. neuro grossly intact

## 2014-11-22 ENCOUNTER — Telehealth: Payer: Self-pay | Admitting: Cardiology

## 2014-11-22 ENCOUNTER — Ambulatory Visit (INDEPENDENT_AMBULATORY_CARE_PROVIDER_SITE_OTHER): Payer: 59 | Admitting: Cardiology

## 2014-11-22 ENCOUNTER — Telehealth: Payer: Self-pay

## 2014-11-22 ENCOUNTER — Encounter: Payer: Self-pay | Admitting: Cardiology

## 2014-11-22 VITALS — BP 126/88 | HR 99 | Ht 70.0 in | Wt 163.0 lb

## 2014-11-22 DIAGNOSIS — R911 Solitary pulmonary nodule: Secondary | ICD-10-CM

## 2014-11-22 DIAGNOSIS — Z7901 Long term (current) use of anticoagulants: Secondary | ICD-10-CM

## 2014-11-22 DIAGNOSIS — I059 Rheumatic mitral valve disease, unspecified: Secondary | ICD-10-CM | POA: Diagnosis not present

## 2014-11-22 DIAGNOSIS — C61 Malignant neoplasm of prostate: Secondary | ICD-10-CM | POA: Diagnosis not present

## 2014-11-22 DIAGNOSIS — Z954 Presence of other heart-valve replacement: Secondary | ICD-10-CM

## 2014-11-22 LAB — POCT INR: INR: 4.4

## 2014-11-22 NOTE — Assessment & Plan Note (Signed)
Continue Coumadin and aspirin. Continue SBE prophylaxis. 

## 2014-11-22 NOTE — Telephone Encounter (Signed)
Pt is calling in to give David [name] the date and time for his biopsy is 8/10 at 10:45am . Please call him back  Thanks

## 2014-11-22 NOTE — Assessment & Plan Note (Signed)
Repeat chest CT November 2016.

## 2014-11-22 NOTE — Telephone Encounter (Signed)
Wrong provider

## 2014-11-22 NOTE — Telephone Encounter (Signed)
Pt will need lovenox bridging prior to the prostate biopsy. Will forward to the [facility] clinic

## 2014-11-22 NOTE — Assessment & Plan Note (Signed)
Patient will require surgery for his prostate in the near future. He will require a Lovenox bridge.

## 2014-11-22 NOTE — Patient Instructions (Signed)
Your physician wants you to follow-up in: 6 MONTHS WITH DR CRENSHAW You will receive a reminder letter in the mail two months in advance. If you don't receive a letter, please call our office to schedule the follow-up appointment.  

## 2014-11-23 ENCOUNTER — Encounter (HOSPITAL_COMMUNITY)
Admission: RE | Admit: 2014-11-23 | Discharge: 2014-11-23 | Disposition: A | Discharge status: Home / Self Care | Payer: 59 | Source: Ambulatory Visit | Attending: Cardiology | Admitting: Cardiology

## 2014-11-23 DIAGNOSIS — Z48812 Encounter for surgical aftercare following surgery on the circulatory system: Secondary | ICD-10-CM | POA: Diagnosis not present

## 2014-11-23 NOTE — Telephone Encounter (Signed)
Spoke with patient, moved appt to 8/1 to coordinate lovenox bridge

## 2014-11-25 ENCOUNTER — Encounter (HOSPITAL_COMMUNITY): Payer: 59

## 2014-11-28 ENCOUNTER — Encounter (HOSPITAL_COMMUNITY): Payer: 59

## 2014-11-28 ENCOUNTER — Encounter (HOSPITAL_COMMUNITY)
Admission: RE | Admit: 2014-11-28 | Discharge: 2014-11-28 | Disposition: A | Discharge status: Home / Self Care | Payer: 59 | Source: Ambulatory Visit | Attending: Cardiology | Admitting: Cardiology

## 2014-11-28 ENCOUNTER — Ambulatory Visit (INDEPENDENT_AMBULATORY_CARE_PROVIDER_SITE_OTHER): Payer: 59 | Admitting: Pharmacist Clinician (PhC)/ Clinical Pharmacy Specialist

## 2014-11-28 DIAGNOSIS — Z48812 Encounter for surgical aftercare following surgery on the circulatory system: Secondary | ICD-10-CM | POA: Insufficient documentation

## 2014-11-28 DIAGNOSIS — Z7901 Long term (current) use of anticoagulants: Secondary | ICD-10-CM | POA: Diagnosis not present

## 2014-11-28 DIAGNOSIS — Z954 Presence of other heart-valve replacement: Secondary | ICD-10-CM | POA: Diagnosis not present

## 2014-11-28 DIAGNOSIS — I059 Rheumatic mitral valve disease, unspecified: Secondary | ICD-10-CM | POA: Diagnosis not present

## 2014-11-28 DIAGNOSIS — Z952 Presence of prosthetic heart valve: Secondary | ICD-10-CM | POA: Insufficient documentation

## 2014-11-28 LAB — POCT INR: INR: 2.9

## 2014-11-28 MED ORDER — ENOXAPARIN SODIUM 80 MG/0.8ML ~~LOC~~ SOLN: 80.0000 mg | Freq: Two times a day (BID) | SUBCUTANEOUS | Status: DC

## 2014-11-28 NOTE — Patient Instructions (Addendum)
Enoxaprin Dosing Schedule  Enoxparin dose:  Date  Warfarin Dose (evenings) Enoxaprin Dose  8-4 6 7.5 mg  (1.5 tabs)   8-5 5 0   8-6 4 0 8am   8pm  8-7 3 0 8am   8pm  8-8 2 0 8am   8pm  8-9 1 0 8am  8-10 Procedure 5 mg     (1 tab)   8-11 1 10  mg  (2 tabs) 8am   8pm  8-12 2 10  mg  (2 tabs) 8am   8pm  8-13 3 7.5 mg  (1.5 tabs) 8am   8pm  8-14 4 7.5 mg  (1.5 tabs) 8am   8pm  8-15 5 Check INR 8am     8-16 6

## 2014-11-30 ENCOUNTER — Encounter (HOSPITAL_COMMUNITY)
Admission: RE | Admit: 2014-11-30 | Discharge: 2014-11-30 | Disposition: A | Discharge status: Home / Self Care | Payer: 59 | Source: Ambulatory Visit | Attending: Cardiology | Admitting: Cardiology

## 2014-11-30 DIAGNOSIS — Z48812 Encounter for surgical aftercare following surgery on the circulatory system: Secondary | ICD-10-CM | POA: Diagnosis not present

## 2014-12-02 ENCOUNTER — Encounter (HOSPITAL_COMMUNITY)
Admission: RE | Admit: 2014-12-02 | Discharge: 2014-12-02 | Disposition: A | Discharge status: Home / Self Care | Payer: 59 | Source: Ambulatory Visit | Attending: Cardiology | Admitting: Cardiology

## 2014-12-02 ENCOUNTER — Ambulatory Visit: Payer: 59 | Admitting: Pharmacist Clinician (PhC)/ Clinical Pharmacy Specialist

## 2014-12-02 DIAGNOSIS — Z48812 Encounter for surgical aftercare following surgery on the circulatory system: Secondary | ICD-10-CM | POA: Diagnosis not present

## 2014-12-05 ENCOUNTER — Encounter (HOSPITAL_COMMUNITY): Payer: 59

## 2014-12-07 ENCOUNTER — Encounter (HOSPITAL_COMMUNITY): Admission: RE | Admit: 2014-12-07 | Payer: 59 | Source: Ambulatory Visit

## 2014-12-09 ENCOUNTER — Encounter (HOSPITAL_COMMUNITY)
Admission: RE | Admit: 2014-12-09 | Discharge: 2014-12-09 | Disposition: A | Discharge status: Home / Self Care | Payer: 59 | Source: Ambulatory Visit | Attending: Cardiology | Admitting: Cardiology

## 2014-12-09 ENCOUNTER — Encounter (HOSPITAL_COMMUNITY): Payer: 59

## 2014-12-09 DIAGNOSIS — Z48812 Encounter for surgical aftercare following surgery on the circulatory system: Secondary | ICD-10-CM | POA: Diagnosis not present

## 2014-12-12 ENCOUNTER — Ambulatory Visit (INDEPENDENT_AMBULATORY_CARE_PROVIDER_SITE_OTHER): Payer: 59 | Admitting: Pharmacist Clinician (PhC)/ Clinical Pharmacy Specialist

## 2014-12-12 ENCOUNTER — Encounter (HOSPITAL_COMMUNITY)
Admission: RE | Admit: 2014-12-12 | Discharge: 2014-12-12 | Disposition: A | Discharge status: Home / Self Care | Payer: 59 | Source: Ambulatory Visit | Attending: Cardiology | Admitting: Cardiology

## 2014-12-12 ENCOUNTER — Encounter (HOSPITAL_COMMUNITY): Payer: 59

## 2014-12-12 DIAGNOSIS — Z48812 Encounter for surgical aftercare following surgery on the circulatory system: Secondary | ICD-10-CM | POA: Diagnosis not present

## 2014-12-12 DIAGNOSIS — Z7901 Long term (current) use of anticoagulants: Secondary | ICD-10-CM | POA: Diagnosis not present

## 2014-12-12 DIAGNOSIS — Z954 Presence of other heart-valve replacement: Secondary | ICD-10-CM | POA: Diagnosis not present

## 2014-12-12 DIAGNOSIS — I059 Rheumatic mitral valve disease, unspecified: Secondary | ICD-10-CM | POA: Diagnosis not present

## 2014-12-12 LAB — POCT INR: INR: 3.6

## 2014-12-12 NOTE — Progress Notes (Signed)
Pt graduated from cardiac rehab program today with completion of 30 exercise sessions in Phase II due to insurance limitations for coverage for 30 exercise sessions. Pt maintained good attendance to exercise and attended some of the education classes due to his work schedule.  Pt progressed nicely during his participation in rehab as evidenced by increased MET level.  Pt increased from 4.6 at the beginning of his participation to 5.2.  Medication list reconciled. Repeat  PHQ score-0.  Pt continues to display positive and healthy coping skills and has good support from his wife and family.  Pt feels he has achieved his goals during cardiac rehab to get back to normal increase strength, able to exercise again - walking 2 miles.   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  Pt plans to go and exercise early as he does here before he goes to work.  I believe he will be successful with this based upon the commitment he as has exhibited in rehab.  It was a delight to have this patient participate in cardiac rehab. Cherre Huger, BSN

## 2014-12-14 ENCOUNTER — Encounter (HOSPITAL_COMMUNITY): Payer: 59

## 2014-12-16 ENCOUNTER — Encounter (HOSPITAL_COMMUNITY): Payer: 59

## 2014-12-19 ENCOUNTER — Encounter (HOSPITAL_COMMUNITY): Payer: 59

## 2014-12-21 ENCOUNTER — Encounter (HOSPITAL_COMMUNITY): Payer: 59

## 2014-12-23 ENCOUNTER — Encounter (HOSPITAL_COMMUNITY): Payer: 59

## 2014-12-26 ENCOUNTER — Encounter (HOSPITAL_COMMUNITY): Payer: 59

## 2014-12-26 ENCOUNTER — Ambulatory Visit (INDEPENDENT_AMBULATORY_CARE_PROVIDER_SITE_OTHER): Payer: 59 | Admitting: Pharmacist Clinician (PhC)/ Clinical Pharmacy Specialist

## 2014-12-26 DIAGNOSIS — Z954 Presence of other heart-valve replacement: Secondary | ICD-10-CM

## 2014-12-26 DIAGNOSIS — Z7901 Long term (current) use of anticoagulants: Secondary | ICD-10-CM

## 2014-12-26 DIAGNOSIS — I059 Rheumatic mitral valve disease, unspecified: Secondary | ICD-10-CM | POA: Diagnosis not present

## 2014-12-26 LAB — POCT INR: INR: 1.9

## 2015-01-09 ENCOUNTER — Encounter: Payer: Self-pay | Admitting: Radiation Oncology

## 2015-01-09 ENCOUNTER — Ambulatory Visit (INDEPENDENT_AMBULATORY_CARE_PROVIDER_SITE_OTHER): Payer: 59 | Admitting: Pharmacist Clinician (PhC)/ Clinical Pharmacy Specialist

## 2015-01-09 ENCOUNTER — Ambulatory Visit
Admission: RE | Admit: 2015-01-09 | Discharge: 2015-01-09 | Disposition: A | Discharge status: Home / Self Care | Payer: 59 | Source: Ambulatory Visit | Attending: Radiation Oncology | Admitting: Radiation Oncology

## 2015-01-09 VITALS — BP 135/89 | HR 103 | Resp 16 | Ht 70.0 in | Wt 166.3 lb

## 2015-01-09 DIAGNOSIS — Z510 Encounter for antineoplastic radiation therapy: Secondary | ICD-10-CM | POA: Diagnosis not present

## 2015-01-09 DIAGNOSIS — I059 Rheumatic mitral valve disease, unspecified: Secondary | ICD-10-CM

## 2015-01-09 DIAGNOSIS — Z954 Presence of other heart-valve replacement: Secondary | ICD-10-CM | POA: Diagnosis not present

## 2015-01-09 DIAGNOSIS — Z7901 Long term (current) use of anticoagulants: Secondary | ICD-10-CM

## 2015-01-09 DIAGNOSIS — I340 Nonrheumatic mitral (valve) insufficiency: Secondary | ICD-10-CM | POA: Diagnosis not present

## 2015-01-09 DIAGNOSIS — C61 Malignant neoplasm of prostate: Secondary | ICD-10-CM | POA: Insufficient documentation

## 2015-01-09 DIAGNOSIS — D649 Anemia, unspecified: Secondary | ICD-10-CM | POA: Diagnosis not present

## 2015-01-09 DIAGNOSIS — M5136 Other intervertebral disc degeneration, lumbar region: Secondary | ICD-10-CM | POA: Insufficient documentation

## 2015-01-09 LAB — POCT INR: INR: 5.3

## 2015-01-09 NOTE — Progress Notes (Signed)
GU Location of Tumor / Histology: prostatic adenocarcinoma  If Prostate Cancer, Gleason Score is (3 + 4) and PSA is (3.16) on 04/14/2014.  Montel Clock presented with nocturia and has been under active surveillance since December 2015.  Biopsies of prostate (if applicable) revealed:    Past/Anticipated interventions by urology, if any: prostate biopsy X2, recommending definitive treatment, patient not interested in surgery despite encouragement from [facility], referred to radiation oncology for consideration of brachytherapy  Past/Anticipated interventions by medical oncology, if any: no  Weight changes, if any: denies weight loss or night sweats.  Bowel/Bladder complaints, if any: nocturia x 1; explains that prior to his heart surgery he experience nocturia x 6.  Nausea/Vomiting, if any: no  Pain issues, if any:  Denies bone pain  SAFETY ISSUES:  Prior radiation? no  Pacemaker/ICD? no  Possible current pregnancy? no  Is the patient on methotrexate? no  Current Complaints / other details:  54 year old male. Married with 1 son and 1 daughter. Works as a Optometrist.

## 2015-01-09 NOTE — Progress Notes (Signed)
See progress note under physician encounter. 

## 2015-01-09 NOTE — Progress Notes (Signed)
Radiation Oncology         [phone]) [phone] ________________________________  Initial outpatient Consultation  Name: David [name] MRN: [id number]  Date: 01/09/2015  DOB: March 15, 1961  CC:Unice Cobble, MD  [name], MD   REFERRING PHYSICIAN: [name], MD  DIAGNOSIS: 54 y.o. gentleman with stage T1c adenocarcinoma of the prostate with a Gleason's score of 3+4 and a PSA of 3.76.    ICD-9-CM ICD-10-CM   1. Malignant neoplasm of prostate [place] PRESENT ILLNESS::David [name] is a 54 y.o. gentleman who was noted to have a rising PSA by his primary care physician, Dr. [name].  Accordingly, he was referred for evaluation in urology by Dr. Janice Norrie where repeat PSA was 3.16 and digital rectal examination was performed at that time revealing no nodules.  The patient proceeded to transrectal ultrasound with 12 biopsies of the prostate on 04/15/15.  Out of 12 core biopsies, 5 were positive.  The maximum Gleason score was 3+3, and this was seen in primarily in both cores from the left base and mid gland, with one core positive on the right side. The patient elected for active surveillance. PSA on 06/27/14 was 2.35. The PSA on 7.7.16 was 3.76. The patient proceeded to transrectal ultrasound with 12 biopsies of the prostate on 12/07/14.  The prostate volume measured 33.45 cc.  Out of 12 core biopsies, 2 were positive.  The maximum Gleason score was 3+4, and this was seen in 40% of the left lateral base and Gleason's score of  3+3 in 80% of the left base.  The patient reviewed the biopsy results with his urologist and he has kindly been referred today for discussion of potential radiation treatment options.   PREVIOUS RADIATION THERAPY: No  PAST MEDICAL HISTORY:  has a past medical history of GERD (gastroesophageal reflux disease); Endocarditis (10-1999); History of chest tube placement (1980); Periodontitis; C. difficile colitis (2009); External hemorrhoids without mention of complication  (31/54/0086); History of blood transfusion (02/2008, 04/2014. ); Prostate cancer; DDD (degenerative disc disease), lumbar; Anemia (2009, 04/2014. ); Mitral regurgitation (07/19/2014); Hemolytic anemia (06/29/2014); and S/P redo mitral valve replacement with metallic valve (7/61/9509).    PAST SURGICAL HISTORY: Past Surgical History  Procedure Laterality Date  . Colonoscopy  2005, 03/2012    external rrhoids 2005, normal exam 2013; [facility]  . Multiple tooth extractions  2009    "to prep for mitral valve repair"  . Mitral valve repair  2009    via a mini thoracotomy  . Prostate biopsy  03/2014  . Cardiac catheterization  2009  . Esophagogastroduodenoscopy N/A 06/02/2014    Procedure: ESOPHAGOGASTRODUODENOSCOPY (EGD);  Surgeon: Jerene Bears, MD;  Location: [facility] ENDOSCOPY;  Service: Endoscopy;  Laterality: N/A;  . Tee without cardioversion N/A 07/19/2014    Procedure: TRANSESOPHAGEAL ECHOCARDIOGRAM (TEE);  Surgeon: Lelon Perla, MD;  Location: [facility] ENDOSCOPY;  Service: Cardiovascular;  Laterality: N/A;  . Left and right heart catheterization with coronary angiogram N/A 08/10/2014    Procedure: LEFT AND RIGHT HEART CATHETERIZATION WITH CORONARY ANGIOGRAM;  Surgeon: Belva Crome, MD;  Location: [facility] CATH LAB;  Service: Cardiovascular;  Laterality: N/A;  . Mitral valve repair N/A 08/11/2014    Procedure: REDO MITRAL VALVE (MV) REPLACEMENT;  Surgeon: Rexene Alberts, MD;  Location: [place];  Service: Open Heart Surgery;  Laterality: N/A;  NO NECK LINES ON RIGHT SIDE  . Tee without cardioversion N/A 08/11/2014    Procedure: TRANSESOPHAGEAL ECHOCARDIOGRAM (TEE);  Surgeon:  Rexene Alberts, MD;  Location: [place];  Service: Open Heart Surgery;  Laterality: N/A;    FAMILY HISTORY: family history includes Alzheimer's disease in his father; Ovarian cancer in his sister. There is no history of Diabetes, Heart disease, or Stroke.  SOCIAL HISTORY:  reports that he quit smoking about 25 years ago. His smoking use  included Cigarettes. He has a 30 pack-year smoking history. He has never used smokeless tobacco. He reports that he does not drink alcohol or use illicit drugs.  ALLERGIES: Clarithromycin  MEDICATIONS:  Current Outpatient Prescriptions  Medication Sig Dispense Refill  . aspirin EC 81 MG EC tablet Take 1 tablet (81 mg total) by mouth daily.    . folic acid (FOLVITE) 1 MG tablet Take 1 tablet (1 mg total) by mouth daily. 30 tablet 3  . loratadine (CLARITIN) 10 MG tablet Take 1 tablet (10 mg total) by mouth daily. 30 tablet 3  . metoprolol succinate (TOPROL XL) 25 MG 24 hr tablet Take 1 tablet (25 mg total) by mouth daily. 90 tablet 3  . warfarin (COUMADIN) 5 MG tablet Take 1 tablet by mouth daily or as directed by coumadin clinic (Patient taking differently: Take 2.5-5 mg by mouth daily at 6 PM. Take 1 tablet by mouth daily or as directed by coumadin clinic) 90 tablet 1   No current facility-administered medications for this encounter.    REVIEW OF SYSTEMS:  A 15 point review of systems is documented in the electronic medical record. This was obtained by the nursing staff. However, I reviewed this with the patient to discuss relevant findings and make appropriate changes.  Pertinent items are noted in HPI..  The patient completed an IPSS and IIEF questionnaire.  His IPSS score was 5 indicating mild urinary outflow obstructive symptoms.  He indicated that his erectile function is able to complete sexual activity half the time.   PHYSICAL EXAM: This patient is in no acute distress.  He is alert and oriented.   height is 5\' 10"  (1.778 m) and weight is 166 lb 4.8 oz (75.433 kg). His blood pressure is 135/89 and his pulse is 103. His respiration is 16 and oxygen saturation is 100%.  He exhibits no respiratory distress or labored breathing.  He appears neurologically intact.  His mood is pleasant.  His affect is appropriate.  Please note the digital rectal exam findings described above.  KPS = 100  100  - Normal; no complaints; no evidence of disease. 90   - Able to carry on normal activity; minor signs or symptoms of disease. 80   - Normal activity with effort; some signs or symptoms of disease. 32   - Cares for self; unable to carry on normal activity or to do active work. 60   - Requires occasional assistance, but is able to care for most of his personal needs. 50   - Requires considerable assistance and frequent medical care. 42   - Disabled; requires special care and assistance. 64   - Severely disabled; hospital admission is indicated although death not imminent. 39   - Very sick; hospital admission necessary; active supportive treatment necessary. 10   - Moribund; fatal processes progressing rapidly. 0     - Dead  Karnofsky DA, Abelmann WH, Craver LS and Burchenal [facility] [phone]) The use of the nitrogen mustards in the palliative treatment of carcinoma: with particular reference to bronchogenic carcinoma Cancer 1 [phone]   LABORATORY DATA:  Lab Results  Component Value Date  WBC 3.7* 09/27/2014   HGB 12.0* 09/27/2014   HCT 37.9* 09/27/2014   MCV 81.9 09/27/2014   PLT 266 09/27/2014   Lab Results  Component Value Date   NA 141 09/27/2014   K 4.1 09/27/2014   CL 103 08/16/2014   CO2 25 09/27/2014   Lab Results  Component Value Date   ALT 19 09/27/2014   AST 25 09/27/2014   ALKPHOS 76 09/27/2014   BILITOT 0.40 09/27/2014     RADIOGRAPHY: No results found.    IMPRESSION: This gentleman is a 54 y.o. gentleman with stage T1c adenocarcinoma of the prostate with a Gleason's score of 3+4 and a PSA of 3.76.  His T-Stage, Gleason's Score, and PSA put him into the intermediate risk group. This patient falls into the select subgroup of intermediate risk patients who may be eligible for seed implant alone.  Accordingly he is eligible for a variety of potential treatment options including seed implant, prostatectomy, or IMRT.  PLAN: Today I reviewed the findings and workup thus far.   We discussed the natural history of prostate cancer.  We reviewed the the implications of T-stage, Gleason's Score, and PSA on decision-making and outcomes in prostate cancer.  We discussed radiation treatment in the management of prostate cancer with regard to the logistics and delivery of external beam radiation treatment as well as the logistics and delivery of prostate brachytherapy.  We compared and contrasted each of these approaches and also compared these against prostatectomy.  The patient expressed interest in prostate brachytherapy.  I filled out a patient counseling form for him with relevant treatment diagrams and we retained a copy for our records.   The patient would like to proceed with prostate brachytherapy.  I will share my findings with Dr. Janice Norrie and move forward with scheduling the procedure in the near future with one of Dr. Sammie Bench colleagues given his upcoming retirement.  I enjoyed meeting with him today, and will look forward to participating in the care of this very nice gentleman.  I spent 60 minutes face to face with the patient and more than 50% of that time was spent in counseling and/or coordination of care.  This document serves as a record of services personally performed by Tyler Pita, MD. It was created on his behalf by Darcus Austin, a trained medical scribe. The creation of this record is based on the scribe's personal observations and the provider's statements to them. This document has been checked and approved by the attending provider.       ------------------------------------------------  Sheral Apley Tammi Klippel, M.D.

## 2015-01-16 ENCOUNTER — Ambulatory Visit (INDEPENDENT_AMBULATORY_CARE_PROVIDER_SITE_OTHER): Payer: 59 | Admitting: Pharmacist Clinician (PhC)/ Clinical Pharmacy Specialist

## 2015-01-16 DIAGNOSIS — I059 Rheumatic mitral valve disease, unspecified: Secondary | ICD-10-CM | POA: Diagnosis not present

## 2015-01-16 DIAGNOSIS — Z954 Presence of other heart-valve replacement: Secondary | ICD-10-CM | POA: Diagnosis not present

## 2015-01-16 DIAGNOSIS — Z7901 Long term (current) use of anticoagulants: Secondary | ICD-10-CM | POA: Diagnosis not present

## 2015-01-16 LAB — POCT INR: INR: 2.7

## 2015-01-24 ENCOUNTER — Other Ambulatory Visit: Payer: Self-pay | Admitting: Urology

## 2015-01-26 ENCOUNTER — Telehealth: Payer: Self-pay | Admitting: *Deleted

## 2015-01-26 NOTE — Telephone Encounter (Signed)
CALLED PATIENT TO REMIND OF PRE-SEED APPT. ON 01-27-15, LVM FOR A RETURN CALL

## 2015-01-27 ENCOUNTER — Ambulatory Visit
Admission: RE | Admit: 2015-01-27 | Discharge: 2015-01-27 | Disposition: A | Discharge status: Home / Self Care | Payer: 59 | Source: Ambulatory Visit | Attending: Radiation Oncology | Admitting: Radiation Oncology

## 2015-01-27 DIAGNOSIS — C61 Malignant neoplasm of prostate: Secondary | ICD-10-CM

## 2015-01-27 DIAGNOSIS — Z510 Encounter for antineoplastic radiation therapy: Secondary | ICD-10-CM | POA: Diagnosis not present

## 2015-01-27 NOTE — Progress Notes (Signed)
  Radiation Oncology         [phone]) [phone] ________________________________  Name: David [name] MRN: [id number]  Date: 01/27/2015  DOB: 1960-08-15  SIMULATION AND TREATMENT PLANNING NOTE PUBIC ARCH STUDY  QV:ZDGLOVF [name], MD  [name], MD  DIAGNOSIS: 54 y.o. gentleman with stage T1c adenocarcinoma of the prostate with a Gleason's score of 3+4 and a PSA of 3.76.     ICD-9-CM ICD-10-CM   1. Prostate cancer 185 C61     COMPLEX SIMULATION:  The patient presented today for evaluation for possible prostate seed implant. He was brought to the radiation planning suite and placed supine on the CT [place]. A 3-dimensional image study set was obtained in upload to the planning computer. There, on each axial slice, I contoured the prostate gland. Then, using three-dimensional radiation planning tools I reconstructed the prostate in view of the structures from the transperineal needle pathway to assess for possible pubic arch interference. In doing so, I did not appreciate any pubic arch interference. Also, the patient's prostate volume was estimated based on the drawn structure. The volume was 29 cc.  Given the pubic arch appearance and prostate volume, patient remains a good candidate to proceed with prostate seed implant. Today, he freely provided informed written consent to proceed.    PLAN: The patient will undergo prostate seed implant.   ________________________________  Sheral Apley. Tammi Klippel, M.D.       This document serves as a record of services personally performed by Tyler Pita, MD. It was created on his behalf by Lenn Cal, a trained medical scribe. The creation of this record is based on the scribe's personal observations and the provider's statements to them. This document has been checked and approved by the attending provider.

## 2015-01-30 ENCOUNTER — Ambulatory Visit (INDEPENDENT_AMBULATORY_CARE_PROVIDER_SITE_OTHER): Payer: 59 | Admitting: Pharmacist Clinician (PhC)/ Clinical Pharmacy Specialist

## 2015-01-30 DIAGNOSIS — Z954 Presence of other heart-valve replacement: Secondary | ICD-10-CM

## 2015-01-30 DIAGNOSIS — Z7901 Long term (current) use of anticoagulants: Secondary | ICD-10-CM | POA: Diagnosis not present

## 2015-01-30 DIAGNOSIS — I059 Rheumatic mitral valve disease, unspecified: Secondary | ICD-10-CM

## 2015-01-30 LAB — POCT INR: INR: 2.8

## 2015-02-03 ENCOUNTER — Telehealth: Payer: Self-pay | Admitting: Cardiology

## 2015-02-03 ENCOUNTER — Other Ambulatory Visit: Payer: Self-pay | Admitting: Urology

## 2015-02-03 NOTE — Telephone Encounter (Signed)
David [name] is calling to have David [name] coumadin stopped before he have surgery for radioactive seed implant on 03/17/15. Please call   Thanks

## 2015-02-03 NOTE — Telephone Encounter (Signed)
LMOM for David [name], aware of surgery date, will coordinate bridge

## 2015-02-20 ENCOUNTER — Ambulatory Visit (INDEPENDENT_AMBULATORY_CARE_PROVIDER_SITE_OTHER): Payer: 59 | Admitting: Pharmacist Clinician (PhC)/ Clinical Pharmacy Specialist

## 2015-02-20 DIAGNOSIS — I059 Rheumatic mitral valve disease, unspecified: Secondary | ICD-10-CM

## 2015-02-20 DIAGNOSIS — Z7901 Long term (current) use of anticoagulants: Secondary | ICD-10-CM

## 2015-02-20 DIAGNOSIS — Z954 Presence of other heart-valve replacement: Secondary | ICD-10-CM | POA: Diagnosis not present

## 2015-02-20 LAB — POCT INR: INR: 3.2

## 2015-02-20 MED ORDER — ENOXAPARIN SODIUM 80 MG/0.8ML ~~LOC~~ SOLN: 80.0000 mg | Freq: Two times a day (BID) | SUBCUTANEOUS | Status: DC

## 2015-02-20 NOTE — Patient Instructions (Signed)
Enoxaprin Dosing Schedule  Enoxparin dose: 80 mg  Date  Warfarin Dose (evenings) Enoxaprin Dose  11-12 6 5  mg (1 tab)   11-13 5 0   11-14 4 0 8 am   8 pm  11-15 3 0 8 am   8 pm  11-16 2 0 8 am   8 pm  11-17 1 0 8 am  11-18 Procedure 5 mg (1 tab)   11-19 1 7.5 mg (1.5 tabs) 8 am   8 pm  11-20 2 7.5 mg (1.5 tabs) 8 am   8 pm  11-21 3 7.5 mg (1.5 tabs) 8 am   8 pm  11-22 4 7.5 mg (1.5 tabs) 8 am   8 pm  11-23 5 Repeat INR 8 am   6

## 2015-02-21 ENCOUNTER — Other Ambulatory Visit: Payer: Self-pay | Admitting: Cardiology

## 2015-02-28 ENCOUNTER — Ambulatory Visit (INDEPENDENT_AMBULATORY_CARE_PROVIDER_SITE_OTHER): Payer: 59 | Admitting: Internal Medicine

## 2015-02-28 ENCOUNTER — Encounter: Payer: Self-pay | Admitting: Internal Medicine

## 2015-02-28 VITALS — BP 138/80 | HR 86 | Temp 97.7°F | Ht 70.0 in | Wt 167.8 lb

## 2015-02-28 DIAGNOSIS — M5414 Radiculopathy, thoracic region: Secondary | ICD-10-CM | POA: Diagnosis not present

## 2015-02-28 DIAGNOSIS — Z23 Encounter for immunization: Secondary | ICD-10-CM | POA: Diagnosis not present

## 2015-02-28 MED ORDER — LORATADINE 10 MG PO TABS
10.0000 mg | ORAL_TABLET | Freq: Every day | ORAL | Status: DC
Start: 2015-02-28 — End: 2016-09-20

## 2015-02-28 MED ORDER — TADALAFIL 20 MG PO TABS: 10.0000 mg | ORAL_TABLET | Freq: Every day | ORAL | Status: DC | PRN

## 2015-02-28 NOTE — Progress Notes (Signed)
Pre visit review using our clinic review tool, if applicable. No additional management support is needed unless otherwise documented below in the visit note. 

## 2015-02-28 NOTE — Addendum Note (Signed)
Addended by: Lowella Dandy on: 02/28/2015 03:32 PM   Modules accepted: Orders

## 2015-02-28 NOTE — Progress Notes (Signed)
   Subjective:    Patient ID: David [name], male    DOB: Sep 25, 1960, 54 y.o.   MRN: [id number]  HPI Symptoms began as right thoracic discomfort 3 days ago without trigger or injury. He describes the symptoms as an isolated dull discomfort which lasts seconds when he flexes the chest to the right.  He has a history of disparate protrusion at L4-5 and 2009 for which he had an epidural steroid injection. That MRI also revealed degenerative disc disease in the lumbosacral spine.   Review of Systems Fever, chills, sweats, or unexplained weight loss not present.  There is no significant cough, sputum production, wheezing,or  paroxysmal nocturnal dyspnea. Vertigo, near syncope or imbalance denied. There is no numbness, tingling, or weakness in extremities.   No loss of control of bladder or bowels. Dysuria, pyuria, hematuria, frequency, nocturia or polyuria are denied.     Objective:   Physical Exam  Pertinent or positive findings include: He has a mechanical click at the apex. He is able to lie flat and sit up without help. His symptoms are initiated with right thoracic flexion.  General appearance :adequately nourished; in no distress.  Eyes: No conjunctival inflammation or scleral icterus is present.  Oral exam:  Lips and gums are healthy appearing.There is no oropharyngeal erythema or exudate noted. Dental hygiene is good.  Heart:  Normal rate and regular rhythm. S1 and S2 normal without gallop, murmur, rub or other extra sounds    Lungs:Chest clear to auscultation; no wheezes, rhonchi,rales ,or rubs present.No increased work of breathing.   Abdomen: bowel sounds normal, soft and non-tender without masses, organomegaly or hernias noted.  No guarding or rebound. No flank tenderness to percussion.  Vascular : all pulses equal ; no bruits present.  Skin:Warm & dry.  Intact without suspicious lesions or rashes ; no tenting   Lymphatic: No lymphadenopathy is noted about the head,  neck, axilla  Neuro: Strength, tone & DTRs normal.      Assessment & Plan:  #1 positional R T7 radiculopathy most likely related to degenerative disc disease Plan: Therapeutic exercises  Imaging and Gabapentin trial if no better

## 2015-02-28 NOTE — Addendum Note (Signed)
Addended by: Lowella Dandy on: 02/28/2015 03:00 PM   Modules accepted: Orders

## 2015-02-28 NOTE — Patient Instructions (Addendum)
     The best exercises for the  back include freestyle swimming, stretch aerobics, and yoga.

## 2015-03-09 ENCOUNTER — Telehealth: Payer: Self-pay | Admitting: *Deleted

## 2015-03-09 NOTE — Telephone Encounter (Signed)
CALLED PATIENT TO INFORM OF LAB FOR IMPLANT ON  03-17-15, SPOKE WITH PATIENT AND HE IS AWARE OF THIS LAB

## 2015-03-10 ENCOUNTER — Telehealth: Payer: Self-pay | Admitting: *Deleted

## 2015-03-10 ENCOUNTER — Encounter (HOSPITAL_BASED_OUTPATIENT_CLINIC_OR_DEPARTMENT_OTHER): Payer: Self-pay | Admitting: *Deleted

## 2015-03-10 DIAGNOSIS — Z7982 Long term (current) use of aspirin: Secondary | ICD-10-CM | POA: Diagnosis not present

## 2015-03-10 DIAGNOSIS — Z87891 Personal history of nicotine dependence: Secondary | ICD-10-CM | POA: Diagnosis not present

## 2015-03-10 DIAGNOSIS — Z7901 Long term (current) use of anticoagulants: Secondary | ICD-10-CM | POA: Diagnosis not present

## 2015-03-10 DIAGNOSIS — I440 Atrioventricular block, first degree: Secondary | ICD-10-CM | POA: Diagnosis not present

## 2015-03-10 DIAGNOSIS — D509 Iron deficiency anemia, unspecified: Secondary | ICD-10-CM | POA: Diagnosis not present

## 2015-03-10 DIAGNOSIS — M1990 Unspecified osteoarthritis, unspecified site: Secondary | ICD-10-CM | POA: Diagnosis not present

## 2015-03-10 DIAGNOSIS — K219 Gastro-esophageal reflux disease without esophagitis: Secondary | ICD-10-CM | POA: Diagnosis not present

## 2015-03-10 DIAGNOSIS — R918 Other nonspecific abnormal finding of lung field: Secondary | ICD-10-CM

## 2015-03-10 DIAGNOSIS — I447 Left bundle-branch block, unspecified: Secondary | ICD-10-CM | POA: Diagnosis not present

## 2015-03-10 DIAGNOSIS — C61 Malignant neoplasm of prostate: Secondary | ICD-10-CM | POA: Diagnosis present

## 2015-03-10 DIAGNOSIS — Z79899 Other long term (current) drug therapy: Secondary | ICD-10-CM | POA: Diagnosis not present

## 2015-03-10 LAB — CBC
HCT: 41.1 % (ref 39.0–52.0)
Hemoglobin: 13.4 g/dL (ref 13.0–17.0)
MCH: 29.3 pg (ref 26.0–34.0)
MCHC: 32.6 g/dL (ref 30.0–36.0)
MCV: 89.9 fL (ref 78.0–100.0)
Platelets: 234 10*3/uL (ref 150–400)
RBC: 4.57 MIL/uL (ref 4.22–5.81)
RDW: 14.1 % (ref 11.5–15.5)
WBC: 2.6 10*3/uL — ABNORMAL LOW (ref 4.0–10.5)

## 2015-03-10 LAB — COMPREHENSIVE METABOLIC PANEL
ALT: 22 U/L (ref 17–63)
AST: 29 U/L (ref 15–41)
Albumin: 4.2 g/dL (ref 3.5–5.0)
Alkaline Phosphatase: 60 U/L (ref 38–126)
Anion gap: 7 (ref 5–15)
BUN: 14 mg/dL (ref 6–20)
CO2: 25 mmol/L (ref 22–32)
Calcium: 9.3 mg/dL (ref 8.9–10.3)
Chloride: 106 mmol/L (ref 101–111)
Creatinine, Ser: 1.06 mg/dL (ref 0.61–1.24)
GFR calc Af Amer: >60 mL/min (ref >60)
GFR calc non Af Amer: >60 mL/min (ref >60)
Glucose, Bld: 84 mg/dL (ref 65–99)
Potassium: 3.9 mmol/L (ref 3.5–5.1)
Sodium: 138 mmol/L (ref 135–145)
Total Bilirubin: 0.6 mg/dL (ref 0.3–1.2)
Total Protein: 7.1 g/dL (ref 6.5–8.1)

## 2015-03-10 LAB — PROTIME-INR
INR: 2.58 — ABNORMAL HIGH (ref 0.00–1.49)
Prothrombin Time: 27.3 seconds — ABNORMAL HIGH (ref 11.6–15.2)

## 2015-03-10 LAB — APTT: aPTT: 34 seconds (ref 24–37)

## 2015-03-10 NOTE — Progress Notes (Signed)
NPO AFTER MN WITH EXCEPTION CLEAR LIQUIDS UNTIL 0700 (NO CREAM/ MILK PRODUCTS).  ARRIVE AT 1130.  CURRENT LAB RESULTS , CXR AND EKG IN CHART AND EPIC.  WILL TAKE CLARITIN AM DOS W/ SIPS OF WATER AND DO FLEET ENEMA.  PT'S VERBALIZED WAS GIVEN INSTRUCTIONS FROM  DR CRENSHAW OFFICE ABOUT BRIDGING W/ LOVENOX INJECTION'S.

## 2015-03-10 NOTE — Telephone Encounter (Signed)
Unable to reach pt or leave a message Pt is due to have a CT wo contrast to f/u lung nodules. Order placed

## 2015-03-16 ENCOUNTER — Telehealth: Payer: Self-pay | Admitting: *Deleted

## 2015-03-16 ENCOUNTER — Ambulatory Visit (INDEPENDENT_AMBULATORY_CARE_PROVIDER_SITE_OTHER)
Admission: RE | Admit: 2015-03-16 | Discharge: 2015-03-16 | Disposition: A | Discharge status: Home / Self Care | Payer: 59 | Source: Ambulatory Visit | Attending: Cardiology | Admitting: Cardiology

## 2015-03-16 DIAGNOSIS — R918 Other nonspecific abnormal finding of lung field: Secondary | ICD-10-CM

## 2015-03-16 NOTE — Progress Notes (Signed)
  Radiation Oncology         [phone]) [phone] ________________________________  Name: David [name] MRN: [id number]  Date: 03/17/2015  DOB: [birth year]       Prostate Seed Implant  CC:Unice Cobble, MD  No ref. provider found  DIAGNOSIS: 54 y.o. gentleman with stage T1c adenocarcinoma of the prostate with a Gleason's score of 3+4 and a PSA of 3.76   (C61)  PROCEDURE: Insertion of radioactive I-125 seeds into the prostate gland.  RADIATION DOSE: 145 Gy, definitive therapy.  TECHNIQUE: David [name] was brought to the operating room with the urologist. He was placed in the dorsolithotomy position. He was catheterized and a rectal tube was inserted. The perineum was shaved, prepped and draped. The ultrasound probe was then introduced into the rectum to see the prostate gland.  TREATMENT DEVICE: A needle grid was attached to the ultrasound probe stand and anchor needles were placed.  3D PLANNING: The prostate was imaged in 3D using a sagittal sweep of the prostate probe. These images were transferred to the planning computer. There, the prostate, urethra and rectum were defined on each axial reconstructed image. Then, the software created an optimized 3D plan and a few seed positions were adjusted. The quality of the plan was reviewed using [facility] information for the target and the following two organs at risk:  Urethra and Rectum.  Then the accepted plan was uploaded to the seed Selectron afterloading unit.  PROSTATE VOLUME STUDY:  Using transrectal ultrasound the volume of the prostate was verified to be 40.88 cc.  SPECIAL TREATMENT PROCEDURE/SUPERVISION AND HANDLING: The Nucletron FIRST system was used to place the needles under sagittal guidance. A total of 27 needles were used to deposit 86 seeds in the prostate gland. The individual seed activity was 0.358 mCi.  COMPLEX SIMULATION: At the end of the procedure, an anterior radiograph of the pelvis was obtained to document seed positioning and  count. Cystoscopy was performed to check the urethra and bladder.  MICRODOSIMETRY: At the end of the procedure, the patient was emitting 0.18 mR/hr at 1 meter. Accordingly, he was considered safe for hospital discharge.  PLAN: The patient will return to the radiation oncology clinic for post implant CT dosimetry in three weeks.   ________________________________  David [name], M.D.

## 2015-03-16 NOTE — Progress Notes (Signed)
Dr Jillyn Hidden , this pt is scheduled for tomorrow whom has had MVR this past April 2016.  Would he need other antibiotic besides cipro ordered by dr [name]?

## 2015-03-16 NOTE — Telephone Encounter (Signed)
CALLED PATIENT TO REMIND OF IMPLANT FOR 03-17-15, LVM FOR A RETURN CALL

## 2015-03-17 ENCOUNTER — Ambulatory Visit (HOSPITAL_COMMUNITY): Payer: 59

## 2015-03-17 ENCOUNTER — Encounter (HOSPITAL_BASED_OUTPATIENT_CLINIC_OR_DEPARTMENT_OTHER): Payer: Self-pay

## 2015-03-17 ENCOUNTER — Ambulatory Visit (HOSPITAL_BASED_OUTPATIENT_CLINIC_OR_DEPARTMENT_OTHER): Payer: 59 | Admitting: Anesthesiology

## 2015-03-17 ENCOUNTER — Encounter (HOSPITAL_BASED_OUTPATIENT_CLINIC_OR_DEPARTMENT_OTHER)
Admission: RE | Disposition: A | Discharge status: Home / Self Care | Payer: Self-pay | Source: Ambulatory Visit | Attending: Urology

## 2015-03-17 ENCOUNTER — Ambulatory Visit (HOSPITAL_BASED_OUTPATIENT_CLINIC_OR_DEPARTMENT_OTHER)
Admission: RE | Admit: 2015-03-17 | Discharge: 2015-03-17 | Disposition: A | Discharge status: Home / Self Care | Payer: 59 | Source: Ambulatory Visit | Attending: Urology | Admitting: Urology

## 2015-03-17 DIAGNOSIS — Z87891 Personal history of nicotine dependence: Secondary | ICD-10-CM | POA: Insufficient documentation

## 2015-03-17 DIAGNOSIS — I440 Atrioventricular block, first degree: Secondary | ICD-10-CM | POA: Insufficient documentation

## 2015-03-17 DIAGNOSIS — M1990 Unspecified osteoarthritis, unspecified site: Secondary | ICD-10-CM | POA: Diagnosis not present

## 2015-03-17 DIAGNOSIS — Z7982 Long term (current) use of aspirin: Secondary | ICD-10-CM | POA: Insufficient documentation

## 2015-03-17 DIAGNOSIS — Z79899 Other long term (current) drug therapy: Secondary | ICD-10-CM | POA: Insufficient documentation

## 2015-03-17 DIAGNOSIS — I447 Left bundle-branch block, unspecified: Secondary | ICD-10-CM | POA: Diagnosis not present

## 2015-03-17 DIAGNOSIS — D509 Iron deficiency anemia, unspecified: Secondary | ICD-10-CM | POA: Insufficient documentation

## 2015-03-17 DIAGNOSIS — K219 Gastro-esophageal reflux disease without esophagitis: Secondary | ICD-10-CM | POA: Insufficient documentation

## 2015-03-17 DIAGNOSIS — Z7901 Long term (current) use of anticoagulants: Secondary | ICD-10-CM | POA: Insufficient documentation

## 2015-03-17 DIAGNOSIS — C61 Malignant neoplasm of prostate: Secondary | ICD-10-CM | POA: Diagnosis not present

## 2015-03-17 HISTORY — DX: Presence of spectacles and contact lenses: Z97.3

## 2015-03-17 HISTORY — PX: RADIOACTIVE SEED IMPLANT: SHX5150

## 2015-03-17 HISTORY — DX: Personal history of other infectious and parasitic diseases: Z86.19

## 2015-03-17 HISTORY — DX: Presence of dental prosthetic device (complete) (partial): Z97.2

## 2015-03-17 HISTORY — DX: Radiculopathy, thoracic region: M54.14

## 2015-03-17 HISTORY — DX: Personal history of other diseases of the digestive system: Z87.19

## 2015-03-17 HISTORY — DX: Other intervertebral disc degeneration, lumbosacral region: M51.37

## 2015-03-17 HISTORY — DX: Other nonspecific abnormal finding of lung field: R91.8

## 2015-03-17 HISTORY — DX: Personal history of other diseases of the circulatory system: Z86.79

## 2015-03-17 HISTORY — DX: Other intervertebral disc degeneration, lumbosacral region without mention of lumbar back pain or lower extremity pain: M51.379

## 2015-03-17 SURGERY — INSERTION, RADIATION SOURCE, PROSTATE
Anesthesia: General

## 2015-03-17 MED ORDER — KETOROLAC TROMETHAMINE 30 MG/ML IJ SOLN
INTRAMUSCULAR | Status: DC | PRN
  Administered 2015-03-17: 30 mg via INTRAVENOUS

## 2015-03-17 MED ORDER — PROPOFOL 10 MG/ML IV BOLUS
INTRAVENOUS | Status: AC
  Filled 2015-03-17: qty 20

## 2015-03-17 MED ORDER — FENTANYL CITRATE (PF) 250 MCG/5ML IJ SOLN
INTRAMUSCULAR | Status: AC
  Filled 2015-03-17: qty 5

## 2015-03-17 MED ORDER — PHENAZOPYRIDINE HCL 200 MG PO TABS: 200.0000 mg | ORAL_TABLET | Freq: Three times a day (TID) | ORAL | Status: DC | PRN

## 2015-03-17 MED ORDER — DEXAMETHASONE SODIUM PHOSPHATE 4 MG/ML IJ SOLN
INTRAMUSCULAR | Status: DC | PRN
  Administered 2015-03-17: 10 mg via INTRAVENOUS

## 2015-03-17 MED ORDER — TRAMADOL HCL 50 MG PO TABS: 50.0000 mg | ORAL_TABLET | Freq: Four times a day (QID) | ORAL | Status: DC | PRN

## 2015-03-17 MED ORDER — FENTANYL CITRATE (PF) 100 MCG/2ML IJ SOLN
25.0000 ug | INTRAMUSCULAR | Status: DC | PRN
  Filled 2015-03-17: qty 1

## 2015-03-17 MED ORDER — LACTATED RINGERS IV SOLN
INTRAVENOUS | Status: DC
  Administered 2015-03-17 (×2): via INTRAVENOUS
  Filled 2015-03-17: qty 1000

## 2015-03-17 MED ORDER — KETOROLAC TROMETHAMINE 30 MG/ML IJ SOLN
INTRAMUSCULAR | Status: AC
  Filled 2015-03-17: qty 1

## 2015-03-17 MED ORDER — CIPROFLOXACIN IN D5W 400 MG/200ML IV SOLN
INTRAVENOUS | Status: AC
  Filled 2015-03-17: qty 200

## 2015-03-17 MED ORDER — LIDOCAINE HCL (CARDIAC) 20 MG/ML IV SOLN
INTRAVENOUS | Status: DC | PRN
  Administered 2015-03-17: 100 mg via INTRAVENOUS

## 2015-03-17 MED ORDER — CIPROFLOXACIN IN D5W 400 MG/200ML IV SOLN
400.0000 mg | INTRAVENOUS | Status: AC
  Administered 2015-03-17: 400 mg via INTRAVENOUS
  Filled 2015-03-17: qty 200

## 2015-03-17 MED ORDER — CIPROFLOXACIN HCL 500 MG PO TABS: 500.0000 mg | ORAL_TABLET | Freq: Two times a day (BID) | ORAL | Status: DC

## 2015-03-17 MED ORDER — FLEET ENEMA 7-19 GM/118ML RE ENEM
1.0000 | ENEMA | Freq: Once | RECTAL | Status: DC
  Filled 2015-03-17: qty 1

## 2015-03-17 MED ORDER — DEXAMETHASONE SODIUM PHOSPHATE 10 MG/ML IJ SOLN
INTRAMUSCULAR | Status: AC
  Filled 2015-03-17: qty 1

## 2015-03-17 MED ORDER — TAMSULOSIN HCL 0.4 MG PO CAPS: 0.4000 mg | ORAL_CAPSULE | Freq: Every day | ORAL | Status: DC

## 2015-03-17 MED ORDER — MIDAZOLAM HCL 2 MG/2ML IJ SOLN
INTRAMUSCULAR | Status: AC
  Filled 2015-03-17: qty 2

## 2015-03-17 MED ORDER — MIDAZOLAM HCL 5 MG/5ML IJ SOLN
INTRAMUSCULAR | Status: DC | PRN
  Administered 2015-03-17: 2 mg via INTRAVENOUS

## 2015-03-17 MED ORDER — SULFAMETHOXAZOLE-TRIMETHOPRIM 800-160 MG PO TABS: 1.0000 | ORAL_TABLET | Freq: Two times a day (BID) | ORAL | Status: DC

## 2015-03-17 MED ORDER — WARFARIN SODIUM 5 MG PO TABS: 5.0000 mg | ORAL_TABLET | Freq: Every day | ORAL | Status: DC

## 2015-03-17 MED ORDER — LIDOCAINE HCL (CARDIAC) 20 MG/ML IV SOLN
INTRAVENOUS | Status: AC
  Filled 2015-03-17: qty 5

## 2015-03-17 MED ORDER — LACTATED RINGERS IV SOLN
INTRAVENOUS | Status: DC
  Filled 2015-03-17: qty 1000

## 2015-03-17 MED ORDER — IOHEXOL 350 MG/ML SOLN
INTRAVENOUS | Status: DC | PRN
  Administered 2015-03-17: 7 mL

## 2015-03-17 MED ORDER — SUCCINYLCHOLINE CHLORIDE 20 MG/ML IJ SOLN
INTRAMUSCULAR | Status: DC | PRN
  Administered 2015-03-17: 100 mg via INTRAVENOUS

## 2015-03-17 MED ORDER — FENTANYL CITRATE (PF) 100 MCG/2ML IJ SOLN
INTRAMUSCULAR | Status: DC | PRN
  Administered 2015-03-17 (×10): 25 ug via INTRAVENOUS

## 2015-03-17 MED ORDER — ONDANSETRON HCL 4 MG/2ML IJ SOLN
INTRAMUSCULAR | Status: DC | PRN
  Administered 2015-03-17: 4 mg via INTRAVENOUS

## 2015-03-17 MED ORDER — ONDANSETRON HCL 4 MG/2ML IJ SOLN
INTRAMUSCULAR | Status: AC
  Filled 2015-03-17: qty 2

## 2015-03-17 MED ORDER — PROPOFOL 10 MG/ML IV BOLUS
INTRAVENOUS | Status: DC | PRN
  Administered 2015-03-17: 160 mg via INTRAVENOUS

## 2015-03-17 SURGICAL SUPPLY — 30 items
BAG URINE DRAINAGE (UROLOGICAL SUPPLIES) ×2 IMPLANT
BLADE CLIPPER SURG (BLADE) ×2 IMPLANT
CATH FOLEY 2WAY SLVR  5CC 16FR (CATHETERS) ×1
CATH FOLEY 2WAY SLVR 5CC 16FR (CATHETERS) ×1 IMPLANT
CATH ROBINSON RED A/P 16FR (CATHETERS) ×2 IMPLANT
CATH ROBINSON RED A/P 20FR (CATHETERS) ×2 IMPLANT
CLOTH BEACON ORANGE TIMEOUT ST (SAFETY) ×2 IMPLANT
COVER BACK TABLE 60X90IN (DRAPES) ×2 IMPLANT
COVER MAYO STAND STRL (DRAPES) ×2 IMPLANT
DRSG TEGADERM 4X4.75 (GAUZE/BANDAGES/DRESSINGS) ×2 IMPLANT
DRSG TEGADERM 8X12 (GAUZE/BANDAGES/DRESSINGS) ×2 IMPLANT
DRSG TELFA 3X8 NADH (GAUZE/BANDAGES/DRESSINGS) ×2 IMPLANT
GLOVE BIO SURGEON STRL SZ7.5 (GLOVE) ×4 IMPLANT
GLOVE ECLIPSE 8.0 STRL XLNG CF (GLOVE) ×2 IMPLANT
GOWN STRL REUS W/ TWL LRG LVL3 (GOWN DISPOSABLE) ×1 IMPLANT
GOWN STRL REUS W/ TWL XL LVL3 (GOWN DISPOSABLE) ×1 IMPLANT
GOWN STRL REUS W/TWL LRG LVL3 (GOWN DISPOSABLE) ×1
GOWN STRL REUS W/TWL XL LVL3 (GOWN DISPOSABLE) ×1
HOLDER FOLEY CATH W/STRAP (MISCELLANEOUS) ×2 IMPLANT
IV NS 1000ML (IV SOLUTION) ×1
IV NS 1000ML BAXH (IV SOLUTION) ×1 IMPLANT
KIT ROOM TURNOVER WOR (KITS) ×2 IMPLANT
MANIFOLD NEPTUNE II (INSTRUMENTS) IMPLANT
PACK CYSTO (CUSTOM PROCEDURE TRAY) ×2 IMPLANT
SELECTSEED ×172 IMPLANT
SPONGE GAUZE 4X4 12PLY STER LF (GAUZE/BANDAGES/DRESSINGS) ×2 IMPLANT
SYRINGE 10CC LL (SYRINGE) ×2 IMPLANT
TUBE CONNECTING 12X1/4 (SUCTIONS) IMPLANT
UNDERPAD 30X30 INCONTINENT (UNDERPADS AND DIAPERS) ×4 IMPLANT
WATER STERILE IRR 500ML POUR (IV SOLUTION) ×2 IMPLANT

## 2015-03-17 NOTE — H&P (Signed)
History of Present Illness David [name] has Gleason 6 prostate cancer in <5 to 60% of 5 cores with atypia in another core. PSA at diagnosis on 04/14/14 was 3.16. He has low risk prostate cancer. He chose to be on active surveillance. In view of his age radical prostatectomy would be a better option for him. He was found to have microcytic anemia and received 4 units of packed cells to bring his hemoglobin to 9.4 from 5.9. He had mitral valve surgery on 4/7. He states that his hemoglobin is normal. He does not have any more nocturia. At the most he would get up once at night to urinate. Repeat biopsy showed Gleason 3+4 in 40% of one core at the left lateral base and Gleason 6 in 80% of another core at the left medial base. The previous biopsy was positive at the left base, left mid gland and right mid gland. He probably has more extensive disease than the current biopsy shows. I went again over the treatment options with him. I believe he needs definitive therapy: radical robotic prostatectomy, IMRT, brachytherapy. The risks, benefits of each option were discussed in detail. He had previously visited the web sites about prostate cancer. He is well aware of his options. He returns today for further discussion and his decision regarding his treatment of choice. He states that he does not want to have surgery. He recently had open heart surgery. He would like to consider brachytherapy. He is not interested in a consultation at the [facility]. I will refer him to Radiation-Oncology for consultation.  Past Medical History Problems  1. History of No significant past medical history  Surgical History Problems  1. History of Heart Surgery 2. History of Mitral Valve Repair 3. History of Therapeutic Pneumothorax  Current Meds 1. Aspirin TABS;  Therapy: (Recorded:05Nov2015) to Recorded 2. Claritin TABS;  Therapy: (Recorded:05Nov2015) to Recorded 3. Coumadin TABS (Warfarin Sodium);  Therapy: (Recorded:08Jul2016) to  Recorded 4. Levofloxacin 500 MG Oral Tablet (Levaquin); 1 tablet the day before the procedure, 1  tablet the day of the procedure, and 1 tablet the day after the procedure;  Therapy: [id number] to (Last Rx:18Nov2015) Ordered 5. Levofloxacin 500 MG Oral Tablet (Levaquin); 1 tablet the day before the procedure, 1  tablet the day of the procedure, and 1 tablet the day after the procedure;  Therapy: [id number] to (Last Rx:26Jul2016) Ordered 6. Omega-3 CAPS;  Therapy: (Recorded:05Nov2015) to Recorded 7. Vitamin C TABS;  Therapy: (Recorded:05Nov2015) to Recorded 8. Zantac TABS (Ranitidine HCl);  Therapy: (Recorded:05Nov2015) to Recorded  Allergies Medication  1. No Known Drug Allergies  Family History Problems  1. Family history of Deceased : Mother, Father  Social History Problems  1. Caffeine use (F15.90)   5 per day 2. Former smoker [phone] 3. Former smoker (Z87.891)   2 ppd x 28yrs 4. Married 5. No alcohol use 6. Number of children   1 son, 1 daughter 82. Occupation   Optometrist  Review of Systems Genitourinary, constitutional, skin, eye, otolaryngeal, hematologic/lymphatic, cardiovascular, pulmonary, endocrine, musculoskeletal, gastrointestinal, neurological and psychiatric system(s) were reviewed and pertinent findings if present are noted and are otherwise negative.    Physical Exam Constitutional: Well nourished and well developed . No acute distress.    Results/Data Urine [Data Includes: Last 1 Day]   31Aug2016  COLOR YELLOW   APPEARANCE CLEAR   SPECIFIC GRAVITY 1.015   pH 6.0   GLUCOSE NEGATIVE   BILIRUBIN NEGATIVE   KETONE NEGATIVE   BLOOD 2+  PROTEIN NEGATIVE   NITRITE NEGATIVE   LEUKOCYTE ESTERASE NEGATIVE   SQUAMOUS EPITHELIAL/HPF NONE SEEN HPF  WBC 0-5 WBC/HPF  RBC NONE SEEN RBC/HPF  BACTERIA MODERATE HPF  CRYSTALS NONE SEEN HPF  CASTS NONE SEEN LPF  Yeast NONE SEEN HPF   Assessment Assessed  1. Adenocarcinoma of prostate  (C61)  Plan Adenocarcinoma of prostate  1. Radiation Oncology Referral Referral  Referral to Dr Tammi Klippel or Dr Valere Dross  Status: Hold  For - Appointment,Records  Requested for: 31Aug2016 Health Maintenance  2. UA With REFLEX; [Do Not Release]; Status:Complete;   Done: [id number] 10:35AM Urinary tract infection  3. URINE CULTURE; Status:In Progress - Specimen/Data Collected;   Done: 31Aug2016  Radiation-Oncology consultation.

## 2015-03-17 NOTE — Interval H&P Note (Signed)
History and Physical Interval Note:  03/17/2015 1:14 PM  David [name]  has presented today for surgery, with the diagnosis of PROSTATE CANCER  The various methods of treatment have been discussed with the patient and family. After consideration of risks, benefits and other options for treatment, the patient has consented to  Procedure(s) with comments: RADIOACTIVE SEED IMPLANT/BRACHYTHERAPY IMPLANT (N/A) - DR PORTABLE as a surgical intervention .  The patient's history has been reviewed, patient examined, no change in status, stable for surgery.  I have reviewed the patient's chart and labs.  Questions were answered to the patient's satisfaction.     [name] W

## 2015-03-17 NOTE — Anesthesia Postprocedure Evaluation (Signed)
  Anesthesia Post-op Note  Patient: David [name]  Procedure(s) Performed: Procedure(s) (LRB): RADIOACTIVE SEED IMPLANT/BRACHYTHERAPY IMPLANT (N/A)  Patient Location: PACU  Anesthesia Type: General  Level of Consciousness: awake and alert   Airway and Oxygen Therapy: Patient Spontanous Breathing  Post-op Pain: mild  Post-op Assessment: Post-op Vital signs reviewed, Patient's Cardiovascular Status Stable, Respiratory Function Stable, Patent Airway and No signs of Nausea or vomiting  Last Vitals:  Filed Vitals:   03/17/15 1530  BP: 130/74  Pulse: 85  Temp:   Resp: 21    Post-op Vital Signs: stable   Complications: No apparent anesthesia complications

## 2015-03-17 NOTE — Op Note (Signed)
Preoperative diagnosis: Clinical stage TI C adenocarcinoma the prostate  Postoperative diagnosis: Same  Procedure: I-125 prostate seed implantation with Nucletron robotic implanter  Surgeon: [name], M.D., Tyler Pita, M.D.  Anesthesia: Gen.  Indications: Patient  was diagnosed with clinical stage TI C prostate cancer. We had extensive discussion with him about treatment options versus. He elected to proceed with seed implantation. He underwent consultation my office as well as with Dr. Arloa Koh. He appeared to understand the advantages disadvantages potential risks of this treatment option. Full informed consent has been obtained. The patient is had preoperative ciprofloxacin. PAS compression boots were placed.  Technique and findings: Patient was brought the operating room where he had successful induction of general anesthesia. He was placed in lithotomy position and prepped and draped in usual manner. Appropriate surgical timeout was performed. Radiation oncology department placed a transrectal ultrasound probe anchoring stand. Foley catheter with contrast in the balloon was inserted without difficulty. Anchoring needles were placed within the prostate. Real-time contouring of the urethra prostate and rectum were performed and the dosing parameters were established. Targeted dose was 145 gray. We then came to the operating suite suite for placement of the needles. A second timeout was performed. All needle passage was done with real-time transrectal ultrasound guidance with the sagittal plane. A total of 27 needles were placed. See implantation itself was done with the robotic implanter. 81 active seeds were implanted. A Foley catheter was removed and flexible cystoscopy failed to show any seeds outside the prostate. The Foley catheter was inserted which drained clear urine. The patient was brought to recovery room in stable condition.

## 2015-03-17 NOTE — Anesthesia Preprocedure Evaluation (Addendum)
Anesthesia Evaluation  Patient identified by MRN, date of birth, ID band Patient awake    Reviewed: Allergy & Precautions, NPO status , Patient's Chart, lab work & pertinent test results  History of Anesthesia Complications Negative for: history of anesthetic complications  Airway Mallampati: II  TM Distance: >3 FB Neck ROM: Full    Dental no notable dental hx. (+) Dental Advisory Given, Partial Upper   Pulmonary former smoker,    Pulmonary exam normal breath sounds clear to auscultation       Cardiovascular Exercise Tolerance: Good negative cardio ROS Normal cardiovascular exam+ Valvular Problems/Murmurs (s/p MVR, significant improvement in symptoms since surgery, exercises daily)  Rhythm:Regular Rate:Normal  5/16 Echo: The cavity size was normal. There was mild focal basal hypertrophy of the septum. Systolic function was mildly reduced. The estimated ejection fraction was in the range of 45% to 50%. Wall motion was normal; there were no regional wall motion abnormalities. The study is not technically sufficient to allow evaluation of LV diastolic function.  - Mitral valve: Mechanical bileaflet valve sits well in the mitral position. There is no central or paravalvular regurgitation. Leaflets appear to open well. Transmitral gradient is minimally elevated with mean gradient 6 mmHg. Valve area by continuity equation (using LVOT flow): 1.61 cm^2.    Neuro/Psych negative neurological ROS  negative psych ROS   GI/Hepatic Neg liver ROS, GERD  Medicated and Controlled,  Endo/Other  negative endocrine ROS  Renal/GU negative Renal ROS  negative genitourinary   Musculoskeletal  (+) Arthritis ,   Abdominal   Peds negative pediatric ROS (+)  Hematology negative hematology ROS (+)   Anesthesia Other Findings   Reproductive/Obstetrics negative OB ROS                             Anesthesia Physical Anesthesia Plan  ASA: III  Anesthesia Plan: General   Post-op Pain Management:    Induction: Intravenous  Airway Management Planned: Oral ETT  Additional Equipment:   Intra-op Plan:   Post-operative Plan: Extubation in OR  Informed Consent: I have reviewed the patients History and Physical, chart, labs and discussed the procedure including the risks, benefits and alternatives for the proposed anesthesia with the patient or authorized representative who has indicated his/her understanding and acceptance.   Dental advisory given  Plan Discussed with: CRNA  Anesthesia Plan Comments: (AHA guidelines no longer recommend antibiotic ppx for endocarditis for GI or GU procedures. )       Anesthesia Quick Evaluation

## 2015-03-17 NOTE — Anesthesia Procedure Notes (Signed)
Procedure Name: Intubation Date/Time: 03/17/2015 1:52 PM Performed by: Justice Rocher Pre-anesthesia Checklist: Patient identified, Emergency Drugs available, Suction available and Patient being monitored Patient Re-evaluated:Patient Re-evaluated prior to inductionOxygen Delivery Method: Circle System Utilized Preoxygenation: Pre-oxygenation with 100% oxygen Intubation Type: IV induction Ventilation: Mask ventilation without difficulty Laryngoscope Size: Mac and 3 Tube type: Oral Tube size: 7.0 mm Number of attempts: 1 Airway Equipment and Method: Stylet and Oral airway Placement Confirmation: ETT inserted through vocal cords under direct vision,  positive ETCO2 and breath sounds checked- equal and bilateral Secured at: 23 cm Tube secured with: Tape Dental Injury: Teeth and Oropharynx as per pre-operative assessment

## 2015-03-17 NOTE — Transfer of Care (Signed)
Immediate Anesthesia Transfer of Care Note  Patient: David [name]  Procedure(s) Performed: Procedure(s) (LRB): RADIOACTIVE SEED IMPLANT/BRACHYTHERAPY IMPLANT (N/A)  Patient Location: PACU  Anesthesia Type: General  Level of Consciousness: awake, sedated, patient cooperative and responds to stimulation  Airway & Oxygen Therapy: Patient Spontanous Breathing and Patient connected to face mask oxygen  Post-op Assessment: Report given to PACU RN, Post -op Vital signs reviewed and stable and Patient moving all extremities  Post vital signs: Reviewed and stable  Complications: No apparent anesthesia complications

## 2015-03-17 NOTE — Discharge Instructions (Signed)

## 2015-03-20 ENCOUNTER — Encounter (HOSPITAL_BASED_OUTPATIENT_CLINIC_OR_DEPARTMENT_OTHER): Payer: Self-pay | Admitting: Urology

## 2015-03-22 ENCOUNTER — Ambulatory Visit (INDEPENDENT_AMBULATORY_CARE_PROVIDER_SITE_OTHER): Payer: 59 | Admitting: Pharmacist Clinician (PhC)/ Clinical Pharmacy Specialist

## 2015-03-22 DIAGNOSIS — Z7901 Long term (current) use of anticoagulants: Secondary | ICD-10-CM | POA: Diagnosis not present

## 2015-03-22 DIAGNOSIS — I059 Rheumatic mitral valve disease, unspecified: Secondary | ICD-10-CM | POA: Diagnosis not present

## 2015-03-22 DIAGNOSIS — Z954 Presence of other heart-valve replacement: Secondary | ICD-10-CM | POA: Diagnosis not present

## 2015-03-22 LAB — POCT INR: INR: 2

## 2015-03-22 NOTE — Telephone Encounter (Signed)
Ct scheduled and complete.

## 2015-03-28 ENCOUNTER — Encounter (HOSPITAL_BASED_OUTPATIENT_CLINIC_OR_DEPARTMENT_OTHER): Payer: Self-pay | Admitting: Urology

## 2015-03-29 ENCOUNTER — Encounter: Payer: Self-pay | Admitting: Cardiology

## 2015-03-31 ENCOUNTER — Ambulatory Visit (INDEPENDENT_AMBULATORY_CARE_PROVIDER_SITE_OTHER): Payer: 59 | Admitting: Pharmacist Clinician (PhC)/ Clinical Pharmacy Specialist

## 2015-03-31 DIAGNOSIS — Z7901 Long term (current) use of anticoagulants: Secondary | ICD-10-CM | POA: Diagnosis not present

## 2015-03-31 DIAGNOSIS — Z954 Presence of other heart-valve replacement: Secondary | ICD-10-CM | POA: Diagnosis not present

## 2015-03-31 DIAGNOSIS — I059 Rheumatic mitral valve disease, unspecified: Secondary | ICD-10-CM | POA: Diagnosis not present

## 2015-03-31 LAB — POCT INR: INR: 2.1

## 2015-04-06 ENCOUNTER — Telehealth: Payer: Self-pay | Admitting: *Deleted

## 2015-04-06 NOTE — Telephone Encounter (Signed)
CALLED PATIENT TO REMIND OF APPTS. FOR 04-07-15, SPOKE WITH PATIENT AND HE IS AWARE OF THESE APPTS.

## 2015-04-07 ENCOUNTER — Encounter: Payer: Self-pay | Admitting: Radiation Oncology

## 2015-04-07 ENCOUNTER — Ambulatory Visit
Admission: RE | Admit: 2015-04-07 | Discharge: 2015-04-07 | Disposition: A | Discharge status: Home / Self Care | Payer: 59 | Source: Ambulatory Visit | Attending: Radiation Oncology | Admitting: Radiation Oncology

## 2015-04-07 VITALS — BP 127/82 | HR 88 | Resp 16

## 2015-04-07 DIAGNOSIS — C61 Malignant neoplasm of prostate: Secondary | ICD-10-CM

## 2015-04-07 DIAGNOSIS — Z510 Encounter for antineoplastic radiation therapy: Secondary | ICD-10-CM | POA: Diagnosis not present

## 2015-04-07 NOTE — Progress Notes (Signed)
  Radiation Oncology         [phone]) [phone] ________________________________  Name: David [name] MRN: [id number]  Date: 04/07/2015  DOB: [birth year]  COMPLEX SIMULATION NOTE  NARRATIVE:  The patient was brought to the [facility] today following prostate seed implantation approximately one month ago.  Identity was confirmed.  All relevant records and images related to the planned course of therapy were reviewed.  Then, the patient was set-up supine.  CT images were obtained.  The CT images were loaded into the planning software.  Then the prostate and rectum were contoured.  Treatment planning then occurred.  The implanted iodine 125 seeds were identified by the physics staff for projection of radiation distribution  I have requested : 3D Simulation  I have requested a DVH of the following structures: Prostate and rectum.    ________________________________  Sheral Apley Tammi Klippel, M.D.  This document serves as a record of services personally performed by Tyler Pita, MD. It was created on his behalf by Arlyce Harman, a trained medical scribe. The creation of this record is based on the scribe's personal observations and the provider's statements to them. This document has been checked and approved by the attending provider.

## 2015-04-07 NOTE — Progress Notes (Signed)
Radiation Oncology         [phone]) [phone] ________________________________  Name: David [name] MRN: [id number]  Date: 04/07/2015  DOB: 11-09-1960  Follow-Up Visit Note  CC: Unice Cobble, MD  [name], MD  Diagnosis:   54 y.o. gentleman with stage T1c adenocarcinoma of the prostate with a Gleason's score of 3+4 and a PSA of 3.76     ICD-9-CM ICD-10-CM   1. Prostate cancer (HCC) 185 C61     Interval Since Last Radiation:  1  months  Narrative:  The patient returns today for routine follow-up.  He is complaining of increased urinary frequency and urinary hesitation symptoms. He filled out a questionnaire regarding urinary function today providing and overall IPSS score of 8 characterizing his symptoms as moderate.  His pre-implant score was 5. He denies any bowel symptoms.  Patient presented to the clinic following post seed simulation. Weight and vitals WDL. Denies pain. Denies dysuria but, reports taking Pyridium. Denies visual hematuria but, reports [name] told him his urine show blood. Reports he has been inconsistently taking his Flomax. Nocturia x 1. Patient reports frequency less than half the time.   ALLERGIES:  is allergic to clarithromycin.  Meds: Current Outpatient Prescriptions  Medication Sig Dispense Refill  . aspirin EC 81 MG EC tablet Take 1 tablet (81 mg total) by mouth daily.    [name] loratadine (CLARITIN) 10 MG tablet Take 1 tablet (10 mg total) by mouth daily. (Patient taking differently: Take 10 mg by mouth every morning. ) 90 tablet 3  . metoprolol succinate (TOPROL XL) 25 MG 24 hr tablet Take 1 tablet (25 mg total) by mouth daily. (Patient taking differently: Take 25 mg by mouth every evening. ) 90 tablet 3  . phenazopyridine (PYRIDIUM) 200 MG tablet Take 1 tablet (200 mg total) by mouth 3 (three) times daily as needed for pain. 10 tablet 0  . tadalafil (CIALIS) 20 MG tablet Take 0.5-1 tablets (10-20 mg total) by mouth daily as needed for erectile dysfunction.  10 tablet 5  . tamsulosin (FLOMAX) 0.4 MG CAPS capsule Take 1 capsule (0.4 mg total) by mouth daily. 30 capsule 2  . traMADol (ULTRAM) 50 MG tablet Take 1-2 tablets (50-100 mg total) by mouth every 6 (six) hours as needed for moderate pain. 30 tablet 0  . warfarin (COUMADIN) 5 MG tablet Take 1 tablet (5 mg total) by mouth daily at 6 PM. Restart once no more blood in your urine x 24 hours. 90 tablet 1  . enoxaparin (LOVENOX) 80 MG/0.8ML injection INJECT 0.8 MLS (80 MG TOTAL) INTO THE SKIN EVERY 12 (TWELVE) HOURS.  0   No current facility-administered medications for this encounter.    Physical Findings: The patient is in no acute distress. Patient is alert and oriented.  blood pressure is 127/82 and his pulse is 88. His respiration is 16 and oxygen saturation is 100%. .  No significant changes.  Lab Findings: Lab Results  Component Value Date   WBC 2.6* 03/10/2015   HGB 13.4 03/10/2015   HCT 41.1 03/10/2015   MCV 89.9 03/10/2015   PLT 234 03/10/2015    Radiographic Findings:  Patient underwent CT imaging in our clinic for post implant dosimetry. The CT appears to demonstrate an adequate distribution of radioactive seeds throughout the prostate gland. There no seeds in her near the rectum. I suspect the final radiation plan and dosimetry will show appropriate coverage of the prostate gland.   Impression: The patient is recovering from the  effects of radiation. His urinary symptoms should gradually improve over the next 4-6 months. We talked about this today. He is encouraged by his improvement already and is otherwise please with his outcome.   Plan: Today, I spent time talking to the patient about his prostate seed implant and resolving urinary symptoms. We also talked about long-term follow-up for prostate cancer following seed implant. He understands that ongoing PSA determinations and digital rectal exams will help perform surveillance to rule out disease recurrence. He understands what  to expect with his PSA measures. Patient was also educated today about some of the long-term effects from radiation including a small risk for rectal bleeding and possibly erectile dysfunction. We talked about some of the general management approaches to these potential complications. However, I did encourage the patient to contact our office or return at any point if he has questions or concerns related to his previous radiation and prostate cancer.  _____________________________________  Sheral Apley. Tammi Klippel, M.D.    This document serves as a record of services personally performed by Tyler Pita, MD. It was created on his behalf by Arlyce Harman, a trained medical scribe. The creation of this record is based on the scribe's personal observations and the provider's statements to them. This document has been checked and approved by the attending provider.

## 2015-04-07 NOTE — Progress Notes (Signed)
Patient presented to the clinic following post seed simulation. Weight and vitals WDL. Denies pain. Denies dysuria but, reports taking Pyridium. Denies visual hematuria but, reports [name] told him his urine show blood. Reports he has been inconsistently taking his Flomax. Pre seed IPSS 5 and post seed IPSS 8. Nocturia x 1. Patient reports frequency less than half the time.   BP 127/82 mmHg  Pulse 88  Resp 16  SpO2 100% Wt Readings from Last 3 Encounters:  03/17/15 163 lb 8 oz (74.163 kg)  02/28/15 167 lb 12 oz (76.091 kg)  01/09/15 166 lb 4.8 oz (75.433 kg)

## 2015-04-14 ENCOUNTER — Ambulatory Visit (INDEPENDENT_AMBULATORY_CARE_PROVIDER_SITE_OTHER): Payer: 59 | Admitting: Pharmacist Clinician (PhC)/ Clinical Pharmacy Specialist

## 2015-04-14 ENCOUNTER — Encounter: Payer: 59 | Admitting: Pharmacist Clinician (PhC)/ Clinical Pharmacy Specialist

## 2015-04-14 DIAGNOSIS — Z954 Presence of other heart-valve replacement: Secondary | ICD-10-CM | POA: Diagnosis not present

## 2015-04-14 DIAGNOSIS — I059 Rheumatic mitral valve disease, unspecified: Secondary | ICD-10-CM | POA: Diagnosis not present

## 2015-04-14 DIAGNOSIS — Z7901 Long term (current) use of anticoagulants: Secondary | ICD-10-CM | POA: Diagnosis not present

## 2015-04-14 LAB — POCT INR: INR: 4.2

## 2015-04-23 ENCOUNTER — Other Ambulatory Visit: Payer: Self-pay | Admitting: Cardiology

## 2015-04-28 ENCOUNTER — Ambulatory Visit: Payer: 59 | Attending: Radiation Oncology

## 2015-04-28 ENCOUNTER — Encounter: Payer: Self-pay | Admitting: Radiation Oncology

## 2015-04-28 DIAGNOSIS — C61 Malignant neoplasm of prostate: Secondary | ICD-10-CM | POA: Insufficient documentation

## 2015-04-28 DIAGNOSIS — Z510 Encounter for antineoplastic radiation therapy: Secondary | ICD-10-CM | POA: Diagnosis not present

## 2015-05-01 NOTE — Progress Notes (Signed)
  Radiation Oncology         [phone]) [phone] ________________________________  Name: WLADIMIR AERNI MRN: [id number]  Date: 04/28/2015  DOB: 25-Jun-1960  3D Planning Note   Prostate Brachytherapy Post-Implant Dosimetry  Diagnosis: 55 y.o. gentleman with stage T1c adenocarcinoma of the prostate with a Gleason's score of 3+4 and a PSA of 3.76.  Narrative: On a previous date, TORIEN EMMETT returned following prostate seed implantation for post implant planning. He underwent CT scan complex simulation to delineate the three-dimensional structures of the pelvis and demonstrate the radiation distribution.  Since that time, the seed localization, and complex isodose planning with dose volume histograms have now been completed.  Results:   Prostate Coverage - The dose of radiation delivered to the 90% or more of the prostate gland (D90) was 108.45% of the prescription dose. This exceeds our goal of greater than 90%. Rectal Sparing - The volume of rectal tissue receiving the prescription dose or higher was 0.01 cc. This falls under our thresholds tolerance of 1.0 cc.  Impression: The prostate seed implant appears to show adequate target coverage and appropriate rectal sparing.  Plan:  The patient will continue to follow with urology for ongoing PSA determinations. I would anticipate a high likelihood for local tumor control with minimal risk for rectal morbidity.  ________________________________  Sheral Apley Tammi Klippel, M.D.

## 2015-05-03 ENCOUNTER — Ambulatory Visit (INDEPENDENT_AMBULATORY_CARE_PROVIDER_SITE_OTHER): Payer: 59 | Admitting: Pharmacist Clinician (PhC)/ Clinical Pharmacy Specialist

## 2015-05-03 DIAGNOSIS — I059 Rheumatic mitral valve disease, unspecified: Secondary | ICD-10-CM

## 2015-05-03 DIAGNOSIS — Z954 Presence of other heart-valve replacement: Secondary | ICD-10-CM

## 2015-05-03 DIAGNOSIS — Z7901 Long term (current) use of anticoagulants: Secondary | ICD-10-CM

## 2015-05-03 LAB — POCT INR: INR: 3.5

## 2015-05-16 NOTE — Progress Notes (Signed)
HPI: FU MVR. He has history of endocarditis. In November of 2009 he had mitral valve repair via a mini thoracotomy. Transesophageal echocardiogram 3/16 revealed normal LV function, s/p MV repair with severe mitral regurgitation directed posteriorly and laterally possibly from perforated posterior mitral valve leaflet. There was a smaller second jet as well.Cath 4/16 showed EF 55, severe MR, severe pulm HTN, 40 LAD. Pt had MVR (bileaflet mechanical prosthesis) 4/16. Echocardiogram May 2016 showed mildly reduced LV function, mechanical mitral valve with no significant mitral regurgitation and mean gradient 6 mmHg. There was mild left atrial enlargement. There was mild to moderate pulmonic insufficiency. Follow-up CT Nov 2016 showed stable right upper lobe, right lower lobe and left upper lobe nodules. Follow-up recommended in 24 months. Since last seen, the patient denies any dyspnea on exertion, orthopnea, PND, pedal edema, palpitations, syncope or chest pain.   Current Outpatient Prescriptions  Medication Sig Dispense Refill  . aspirin EC 81 MG EC tablet Take 1 tablet (81 mg total) by mouth daily.    [name] enoxaparin (LOVENOX) 80 MG/0.8ML injection INJECT 0.8 MLS (80 MG TOTAL) INTO THE SKIN EVERY 12 (TWELVE) HOURS.  0  . loratadine (CLARITIN) 10 MG tablet Take 1 tablet (10 mg total) by mouth daily. (Patient taking differently: Take 10 mg by mouth every morning. ) 90 tablet 3  . metoprolol succinate (TOPROL XL) 25 MG 24 hr tablet Take 1 tablet (25 mg total) by mouth daily. (Patient taking differently: Take 25 mg by mouth every evening. ) 90 tablet 3  . tadalafil (CIALIS) 20 MG tablet Take 0.5-1 tablets (10-20 mg total) by mouth daily as needed for erectile dysfunction. 10 tablet 5  . tamsulosin (FLOMAX) 0.4 MG CAPS capsule Take 1 capsule (0.4 mg total) by mouth daily. 30 capsule 2  . warfarin (COUMADIN) 5 MG tablet Take 1 tablet (5 mg total) by mouth daily at 6 PM. Restart once no more blood in  your urine x 24 hours. 90 tablet 1  . warfarin (COUMADIN) 5 MG tablet Take 1-1.5 tablets by mouth daily as directed by coumadin clinic 120 tablet 1   No current facility-administered medications for this visit.     Past Medical History  Diagnosis Date  . S/P redo mitral valve replacement with metallic valve cardiologist--  dr Stanford Breed    08-11-2014--- 31 mm Sorin Carbomedics Optiform bileaflet mechanical prosthesis  . History of chest tube placement     1980's --- R SIDE  SPONTANEOUS  PNEUMOTHORAX  . History of Clostridium difficile colitis     2009  . Pulmonary nodules followed by dr Stanford Breed    right upper & lower lobe and left lower lobe--  stable per CT 08-29-2014  . Prostate cancer [facility]) urologist--  dr herrick/  oncologist-- dr Tammi Klippel    Stage  T1c,  Gleason 3+4,  PSA 3.76,  vol 33.45cc  . DDD (degenerative disc disease), lumbosacral   . Hemolytic anemia ([place]) 06/29/2014  . History of bacterial endocarditis     07/ 2001  enterococcal//  recurrence  11/ 2009  steptococcus viridans  . Wears glasses   . Wears partial dentures     upper  . History of periodontal disease   . Thoracic radiculopathy     T7  (positional)  per PCP note--  pt doing yoga and swimming state has helpted    Past Surgical History  Procedure Laterality Date  . Colonoscopy  last one 12/ 2013  . Multiple tooth extractions  03-16-2008     w/ alveoloplasty  . Mitral valve repair  03-22-2008    via  right mini thoracotomy  . Prostate biopsy  03/2014  . Esophagogastroduodenoscopy N/A 06/02/2014    Procedure: ESOPHAGOGASTRODUODENOSCOPY (EGD);  Surgeon: Jerene Bears, MD;  Location: [facility] ENDOSCOPY;  Service: Endoscopy;  Laterality: N/A;  . Tee without cardioversion N/A 07/19/2014    Procedure: TRANSESOPHAGEAL ECHOCARDIOGRAM (TEE);  Surgeon: Lelon Perla, MD;  Location: [facility] ENDOSCOPY;  Service: Cardiovascular;  Laterality: N/A;  . Left and right heart catheterization with coronary angiogram N/A 08/10/2014     Procedure: LEFT AND RIGHT HEART CATHETERIZATION WITH CORONARY ANGIOGRAM;  Surgeon: Belva Crome, MD;  Location: [facility] CATH LAB;  Service: Cardiovascular;  Laterality: N/A;   EF 55%,  severe MR, severe pulm HTN,  40% LAD  . Mitral valve repair N/A 08/11/2014    Procedure: REDO MITRAL VALVE (MV) REPLACEMENT;  Surgeon: Rexene Alberts, MD;  Location: [place];  Service: Open Heart Surgery;  Laterality: N/A;  NO NECK LINES ON RIGHT SIDE  . Tee without cardioversion N/A 08/11/2014    Procedure: TRANSESOPHAGEAL ECHOCARDIOGRAM (TEE);  Surgeon: Rexene Alberts, MD;  Location: [place];  Service: Open Heart Surgery;  Laterality: N/A;  . Transthoracic echocardiogram  09-23-2014   dr  Stanford Breed    mild focal basal LVH of the septum,  ef 45-50%,  normal wall motion/  mechanical bileaflet MV sits well in mitrial position, no sig. regurg., valve area 1.61 cm^2,  mean grandient 64mmHg/  mild LAE/  RA prominent Chiari malformation , unchanged from prior study, normal size/  mild TR/  mild to moderate pulmonic insuffiency  . Cardiac catheterization  03-15-2008  dr Lia Foyer    no sig. coronary disease/  normal LVF/  moderate pulmonary hypertension w/ elevated V wave  . Radioactive seed implant N/A 03/17/2015    Procedure: RADIOACTIVE SEED IMPLANT/BRACHYTHERAPY IMPLANT;  Surgeon: Ardis Hughs, MD;  Location: [facility];  Service: Urology;  Laterality: N/A;  DR PORTABLE    Social History   Social History  . Marital Status: Married    Spouse Name: N/A  . Number of Children: N/A  . Years of Education: N/A   Occupational History  . 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 newspaper delivery     delivers papers to distributors, part time on weekends.    Social History Main Topics  . Smoking status: Former Smoker -- 2.00 packs/day for 15 years    Types: Cigarettes    Quit date: 08/08/1989  . Smokeless tobacco: Never Used  . Alcohol Use: No     Comment: "stopped drinking in 1991    . Drug Use: No     Comment: "stopped using drugs in ~ 1984"  . Sexual Activity: Not on file   Other Topics Concern  . Not on file   Social History Narrative   REGULAR EXERCISE 2X [employer],WEIGHTS    Family History  Problem Relation Age of Onset  . Ovarian cancer Sister   . Diabetes Neg Hx   . Heart disease Neg Hx   . Stroke Neg Hx   . Alzheimer's disease Father     ROS: no fevers or chills, productive cough, hemoptysis, dysphasia, odynophagia, melena, hematochezia, dysuria, hematuria, rash, seizure activity, orthopnea, PND, pedal edema, claudication. Remaining systems are negative.  Physical Exam: Well-developed well-nourished in no acute distress.  Skin is warm and dry.  HEENT is normal.  Neck is supple.  Chest is clear to auscultation with normal expansion.  Cardiovascular exam is regular rate and rhythm. Crisp mechanical valve sound.No systolic murmur.  Abdominal exam nontender or distended. No masses palpated. Extremities show no edema. neuro grossly intact

## 2015-05-18 ENCOUNTER — Encounter: Payer: Self-pay | Admitting: Cardiology

## 2015-05-18 ENCOUNTER — Ambulatory Visit (INDEPENDENT_AMBULATORY_CARE_PROVIDER_SITE_OTHER): Payer: 59 | Admitting: Pharmacist Clinician (PhC)/ Clinical Pharmacy Specialist

## 2015-05-18 ENCOUNTER — Ambulatory Visit (INDEPENDENT_AMBULATORY_CARE_PROVIDER_SITE_OTHER): Payer: 59 | Admitting: Cardiology

## 2015-05-18 VITALS — BP 120/80 | HR 88 | Ht 70.0 in | Wt 174.0 lb

## 2015-05-18 DIAGNOSIS — I059 Rheumatic mitral valve disease, unspecified: Secondary | ICD-10-CM | POA: Diagnosis not present

## 2015-05-18 DIAGNOSIS — Z7901 Long term (current) use of anticoagulants: Secondary | ICD-10-CM | POA: Diagnosis not present

## 2015-05-18 DIAGNOSIS — R911 Solitary pulmonary nodule: Secondary | ICD-10-CM | POA: Diagnosis not present

## 2015-05-18 DIAGNOSIS — Z954 Presence of other heart-valve replacement: Secondary | ICD-10-CM | POA: Diagnosis not present

## 2015-05-18 LAB — POCT INR: INR: 3.6

## 2015-05-18 NOTE — Assessment & Plan Note (Signed)
Follow-up chest CT November 2018.

## 2015-05-18 NOTE — Patient Instructions (Signed)
Medication Instructions:   Your physician recommends that you continue on your current medications as directed. Please refer to the Current Medication list given to you today.   If you need a refill on your cardiac medications before your next appointment, please call your pharmacy.  Labwork: NONE ORDER TODAY    Testing/Procedures: NONE ORDER TODAY    Follow-Up:  Your physician wants you to follow-up in: [place] will receive a reminder letter in the mail two months in advance. If you don't receive a letter, please call our office to schedule the follow-up appointment.     Any Other Special Instructions Will Be Listed Below (If Applicable).

## 2015-05-18 NOTE — Assessment & Plan Note (Signed)
Continue Coumadin. Continue SBE prophylaxis.

## 2015-05-22 ENCOUNTER — Ambulatory Visit: Payer: 59 | Admitting: Cardiology

## 2015-06-07 ENCOUNTER — Ambulatory Visit (INDEPENDENT_AMBULATORY_CARE_PROVIDER_SITE_OTHER): Payer: 59 | Admitting: Pharmacist Clinician (PhC)/ Clinical Pharmacy Specialist

## 2015-06-07 DIAGNOSIS — I059 Rheumatic mitral valve disease, unspecified: Secondary | ICD-10-CM

## 2015-06-07 DIAGNOSIS — Z7901 Long term (current) use of anticoagulants: Secondary | ICD-10-CM

## 2015-06-07 DIAGNOSIS — Z954 Presence of other heart-valve replacement: Secondary | ICD-10-CM | POA: Diagnosis not present

## 2015-06-07 LAB — POCT INR: INR: 4

## 2015-06-21 ENCOUNTER — Ambulatory Visit (INDEPENDENT_AMBULATORY_CARE_PROVIDER_SITE_OTHER): Payer: 59 | Admitting: Pharmacist Clinician (PhC)/ Clinical Pharmacy Specialist

## 2015-06-21 DIAGNOSIS — I059 Rheumatic mitral valve disease, unspecified: Secondary | ICD-10-CM | POA: Diagnosis not present

## 2015-06-21 DIAGNOSIS — Z954 Presence of other heart-valve replacement: Secondary | ICD-10-CM

## 2015-06-21 DIAGNOSIS — Z7901 Long term (current) use of anticoagulants: Secondary | ICD-10-CM

## 2015-06-21 LAB — POCT INR: INR: 2.5

## 2015-07-11 ENCOUNTER — Ambulatory Visit (INDEPENDENT_AMBULATORY_CARE_PROVIDER_SITE_OTHER): Payer: 59 | Admitting: Pharmacist Clinician (PhC)/ Clinical Pharmacy Specialist

## 2015-07-11 DIAGNOSIS — I059 Rheumatic mitral valve disease, unspecified: Secondary | ICD-10-CM | POA: Diagnosis not present

## 2015-07-11 DIAGNOSIS — Z7901 Long term (current) use of anticoagulants: Secondary | ICD-10-CM

## 2015-07-11 DIAGNOSIS — Z954 Presence of other heart-valve replacement: Secondary | ICD-10-CM

## 2015-07-11 LAB — POCT INR: INR: 2.5

## 2015-08-01 ENCOUNTER — Ambulatory Visit (INDEPENDENT_AMBULATORY_CARE_PROVIDER_SITE_OTHER): Payer: 59 | Admitting: Family

## 2015-08-01 ENCOUNTER — Encounter: Payer: Self-pay | Admitting: Family

## 2015-08-01 VITALS — BP 128/88 | HR 87 | Temp 98.3°F | Resp 16 | Ht 70.0 in | Wt 175.0 lb

## 2015-08-01 DIAGNOSIS — M25562 Pain in left knee: Secondary | ICD-10-CM | POA: Diagnosis not present

## 2015-08-01 DIAGNOSIS — S39012A Strain of muscle, fascia and tendon of lower back, initial encounter: Secondary | ICD-10-CM | POA: Diagnosis not present

## 2015-08-01 MED ORDER — DICLOFENAC SODIUM 2 % TD SOLN: 1.0000 "application " | Freq: Two times a day (BID) | TRANSDERMAL | Status: DC | PRN

## 2015-08-01 NOTE — Progress Notes (Signed)
Subjective:    Patient ID: David [name], male    DOB: [birth year], 55 y.o.   MRN: [id number]  Chief Complaint  Patient presents with  . Back Pain    has pain in lower left back and the back of left knee     HPI:  David [name] is a 55 y.o. male who  has a past medical history of S/P redo mitral valve replacement with metallic valve (cardiologist--  dr Stanford Breed); History of chest tube placement; History of Clostridium difficile colitis; Pulmonary nodules (followed by dr Stanford Breed); Prostate cancer [facility]) (urologist--  dr herrick/  oncologist-- dr Tammi Klippel); DDD (degenerative disc disease), lumbosacral; Hemolytic anemia ([place]) (06/29/2014); History of bacterial endocarditis; Wears glasses; Wears partial dentures; History of periodontal disease; and Thoracic radiculopathy. and presents today For an office visit.  1.) Left knee pain - Associated symptom of pain and tightness located in the posterior aspect of his left knee has been going on for about a week. Pain is described as sharp and constant. Modifying factors include Advil which does help with his symptoms. Denies any trauma or sounds/sensations heard or felt. No previous history of injury to either knee that he can recall.   2.) Low back pain - Associated symptom of pain located on the left side of his lower back is described as sharp and nagging that has been going on for about 1 week. Modifying factors include Advil which makes it more tolerable. States he does a lot of walking and also uses the steps. Denies any new trauma. Believes it may be the result of lifting as he lifted something and twisted. He works as a Publishing copy which requires some lifting.  Previously had a muscle strain when he was at work from lifting. No saddle anesthesia or changes to bowel/bladder habits.    Allergies  Allergen Reactions  . Clarithromycin Nausea Only     Current Outpatient Prescriptions on File Prior to Visit  Medication Sig Dispense  Refill  . aspirin EC 81 MG EC tablet Take 1 tablet (81 mg total) by mouth daily.    [name] enoxaparin (LOVENOX) 80 MG/0.8ML injection INJECT 0.8 MLS (80 MG TOTAL) INTO THE SKIN EVERY 12 (TWELVE) HOURS.  0  . loratadine (CLARITIN) 10 MG tablet Take 1 tablet (10 mg total) by mouth daily. (Patient taking differently: Take 10 mg by mouth every morning. ) 90 tablet 3  . metoprolol succinate (TOPROL XL) 25 MG 24 hr tablet Take 1 tablet (25 mg total) by mouth daily. (Patient taking differently: Take 25 mg by mouth every evening. ) 90 tablet 3  . tadalafil (CIALIS) 20 MG tablet Take 0.5-1 tablets (10-20 mg total) by mouth daily as needed for erectile dysfunction. 10 tablet 5  . tamsulosin (FLOMAX) 0.4 MG CAPS capsule Take 1 capsule (0.4 mg total) by mouth daily. 30 capsule 2  . warfarin (COUMADIN) 5 MG tablet Take 1 tablet (5 mg total) by mouth daily at 6 PM. Restart once no more blood in your urine x 24 hours. 90 tablet 1  . warfarin (COUMADIN) 5 MG tablet Take 1-1.5 tablets by mouth daily as directed by coumadin clinic 120 tablet 1   No current facility-administered medications on file prior to visit.    Past Medical History  Diagnosis Date  . S/P redo mitral valve replacement with metallic valve cardiologist--  dr Stanford Breed    08-11-2014--- 31 mm Sorin [employer]omed[employer] Optiform bileaflet mechanical prosthesis  . History of chest tube placement  1980's --- R SIDE  SPONTANEOUS  PNEUMOTHORAX  . History of Clostridium difficile colitis     2009  . Pulmonary nodules followed by dr Stanford Breed    right upper & lower lobe and left lower lobe--  stable per CT 08-29-2014  . Prostate cancer [facility]) urologist--  dr herrick/  oncologist-- dr Tammi Klippel    Stage  T1c,  Gleason 3+4,  PSA 3.76,  vol 33.45cc  . DDD (degenerative disc disease), lumbosacral   . Hemolytic anemia ([place]) 06/29/2014  . History of bacterial endocarditis     07/ 2001  enterococcal//  recurrence  11/ 2009  steptococcus viridans  . Wears glasses   .  Wears partial dentures     upper  . History of periodontal disease   . Thoracic radiculopathy     T7  (positional)  per PCP note--  pt doing yoga and swimming state has helpted     Past Surgical History  Procedure Laterality Date  . Colonoscopy  last one 12/ 2013  . Multiple tooth extractions  03-16-2008     w/ alveoloplasty  . Mitral valve repair  03-22-2008    via  right mini thoracotomy  . Prostate biopsy  03/2014  . Esophagogastroduodenoscopy N/A 06/02/2014    Procedure: ESOPHAGOGASTRODUODENOSCOPY (EGD);  Surgeon: Jerene Bears, MD;  Location: [facility] ENDOSCOPY;  Service: Endoscopy;  Laterality: N/A;  . Tee without cardioversion N/A 07/19/2014    Procedure: TRANSESOPHAGEAL ECHOCARDIOGRAM (TEE);  Surgeon: Lelon Perla, MD;  Location: [facility] ENDOSCOPY;  Service: Cardiovascular;  Laterality: N/A;  . Left and right heart catheterization with coronary angiogram N/A 08/10/2014    Procedure: LEFT AND RIGHT HEART CATHETERIZATION WITH CORONARY ANGIOGRAM;  Surgeon: Belva Crome, MD;  Location: [facility] CATH LAB;  Service: Cardiovascular;  Laterality: N/A;   EF 55%,  severe MR, severe pulm HTN,  40% LAD  . Mitral valve repair N/A 08/11/2014    Procedure: REDO MITRAL VALVE (MV) REPLACEMENT;  Surgeon: Rexene Alberts, MD;  Location: [place];  Service: Open Heart Surgery;  Laterality: N/A;  NO NECK LINES ON RIGHT SIDE  . Tee without cardioversion N/A 08/11/2014    Procedure: TRANSESOPHAGEAL ECHOCARDIOGRAM (TEE);  Surgeon: Rexene Alberts, MD;  Location: [place];  Service: Open Heart Surgery;  Laterality: N/A;  . Transthoracic echocardiogram  09-23-2014   dr  Stanford Breed    mild focal basal LVH of the septum,  ef 45-50%,  normal wall motion/  mechanical bileaflet MV sits well in mitrial position, no sig. regurg., valve area 1.61 cm^2,  mean grandient 4mmHg/  mild LAE/  RA prominent Chiari malformation , unchanged from prior study, normal size/  mild TR/  mild to moderate pulmonic insuffiency  . Cardiac catheterization   03-15-2008  dr Lia Foyer    no sig. coronary disease/  normal LVF/  moderate pulmonary hypertension w/ elevated V wave  . Radioactive seed implant N/A 03/17/2015    Procedure: RADIOACTIVE SEED IMPLANT/BRACHYTHERAPY IMPLANT;  Surgeon: Ardis Hughs, MD;  Location: [facility];  Service: Urology;  Laterality: N/A;  DR PORTABLE     Review of Systems  Constitutional: Negative for fever and chills.  Musculoskeletal: Positive for back pain.       Positive for left knee pain.  Neurological: Negative for weakness and numbness.      Objective:    BP 128/88 mmHg  Pulse 87  Temp(Src) 98.3 F (36.8 C) (Oral)  Resp 16  Ht 5\' 10"  (1.778 m)  Wt  175 lb (79.379 kg)  BMI 25.11 kg/m2  SpO2 97% Nursing note and vital signs reviewed.  Physical Exam  Constitutional: He is oriented to person, place, and time. He appears well-developed and well-nourished. No distress.  Cardiovascular: Normal rate, regular rhythm, normal heart sounds and intact distal pulses.   Pulmonary/Chest: Effort normal and breath sounds normal.  Musculoskeletal:  Low back - no obvious deformity, discoloration, or edema noted. Mild tenderness elicited along the left paraspinal musculature in area quadratus lumborum. Range of motion is normal with some discomfort noted in rotation to the left and left lateral bending. Distal pulses and sensation are intact and appropriate. Negative straight leg raise; negative Faber's.  Left knee - no obvious deformity, discoloration, or edema noted. Mild tenderness elicited bilateral posterior corner with no crepitus. Range of motion is normal. Strength is 5+. Ligamentous and meniscal testing are negative. Distal pulses and sensation are intact and appropriate.  Neurological: He is alert and oriented to person, place, and time.  Skin: Skin is warm and dry.  Psychiatric: He has a normal mood and affect. His behavior is normal. Judgment and thought content normal.         Assessment & Plan:   Problem List Items Addressed This Visit      Musculoskeletal and Integument   Strain of lumbar paraspinal muscle - Primary    Symptoms and exam consistent with strain of paraspinal musculature with no spasm evident. Treat with ice and initiate home exercise therapy. Tylenol as needed for discomfort. Start Pennsaid as needed with risk as described under knee pain. Follow up as needed or if symptoms do not improve.       Relevant Medications   Diclofenac Sodium (PENNSAID) 2 % SOLN     Other   Left knee pain    Left knee pain consistent with possible synovitis or tendinitis. No evidence of ligamentous or meniscal pathology. Treat conservatively with ice and home exercise therapy. Start Pennsaid. Discussed using medication only for a short period of time and limited quantity secondary to potential for increased risk of bleeding with coumadin. Instructed to monitor closely. Follow up if symptoms worsen or do not improve with potential for imaging or possible formal therapy.      Relevant Medications   Diclofenac Sodium (PENNSAID) 2 % SOLN       I am having Mr. Kilcrease start on Diclofenac Sodium. I am also having him maintain his aspirin, metoprolol succinate, loratadine, tadalafil, warfarin, tamsulosin, enoxaparin, and warfarin.   Meds ordered this encounter  Medications  . Diclofenac Sodium (PENNSAID) 2 % SOLN    Sig: Place 1 application onto the skin 2 (two) times daily as needed.    Dispense:  112 g    Refill:  1    Order Specific Question:  Supervising Provider    Answer:  Pricilla Holm A [id number]     Follow-up: Return if symptoms worsen or fail to improve.  Mauricio Po, FNP

## 2015-08-01 NOTE — Progress Notes (Signed)
Pre visit review using our clinic review tool, if applicable. No additional management support is needed unless otherwise documented below in the visit note. 

## 2015-08-01 NOTE — Assessment & Plan Note (Signed)
Left knee pain consistent with possible synovitis or tendinitis. No evidence of ligamentous or meniscal pathology. Treat conservatively with ice and home exercise therapy. Start Pennsaid. Discussed using medication only for a short period of time and limited quantity secondary to potential for increased risk of bleeding with coumadin. Instructed to monitor closely. Follow up if symptoms worsen or do not improve with potential for imaging or possible formal therapy.

## 2015-08-01 NOTE — Patient Instructions (Signed)
Thank you for choosing [facility].  Summary/Instructions:  Recommend ice 2-3 times per day and after activity/work.  Exercises daily.  Pennsaid - 2x per day about 1/2 packet per application to knee and back  Knee sleeve for comfort and compression  OTC medications - Tylenol for pain; Icy/hot as needed; Thermacare as needed.   Follow up if symptoms do not improve.   Your prescription(s) have been submitted to your pharmacy or been printed and provided for you. Please take as directed and contact our office if you believe you are having problem(s) with the medication(s) or have any questions.  If your symptoms worsen or fail to improve, please contact our office for further instruction, or in case of emergency go directly to the emergency room at the closest medical facility.   Low Back Strain With Rehab A strain is an injury in which a tendon or muscle is torn. The muscles and tendons of the lower back are vulnerable to strains. However, these muscles and tendons are very strong and require a great force to be injured. Strains are classified into three categories. Grade 1 strains cause pain, but the tendon is not lengthened. Grade 2 strains include a lengthened ligament, due to the ligament being stretched or partially ruptured. With grade 2 strains there is still function, although the function may be decreased. Grade 3 strains involve a complete tear of the tendon or muscle, and function is usually impaired. SYMPTOMS   Pain in the lower back.  Pain that affects one side more than the other.  Pain that gets worse with movement and may be felt in the hip, buttocks, or back of the thigh.  Muscle spasms of the muscles in the back.  Swelling along the muscles of the back.  Loss of strength of the back muscles.  Crackling sound (crepitation) when the muscles are touched. CAUSES  Lower back strains occur when a force is placed on the muscles or tendons that is greater than  they can handle. Common causes of injury include:  Prolonged overuse of the muscle-tendon units in the lower back, usually from incorrect posture.  A single violent injury or force applied to the back. RISK INCREASES WITH:  Sports that involve twisting forces on the spine or a lot of bending at the waist (football, rugby, weightlifting, bowling, golf, tennis, speed skating, racquetball, swimming, running, gymnastics, diving).  Poor strength and flexibility.  Failure to warm up properly before activity.  Family history of lower back pain or disk disorders.  Previous back injury or surgery (especially fusion).  Poor posture with lifting, especially heavy objects.  Prolonged sitting, especially with poor posture. PREVENTION   Learn and use proper posture when sitting or lifting (maintain proper posture when sitting, lift using the knees and legs, not at the waist).  Warm up and stretch properly before activity.  Allow for adequate recovery between workouts.  Maintain physical fitness:  Strength, flexibility, and endurance.  Cardiovascular fitness. PROGNOSIS  If treated properly, lower back strains usually heal within 6 weeks. RELATED COMPLICATIONS   Recurring symptoms, resulting in a chronic problem.  Chronic inflammation, scarring, and partial muscle-tendon tear.  Delayed healing or resolution of symptoms.  Prolonged disability. TREATMENT  Treatment first involves the use of ice and medicine, to reduce pain and inflammation. The use of strengthening and stretching exercises may help reduce pain with activity. These exercises may be performed at home or with a therapist. Severe injuries may require referral to a therapist for further  evaluation and treatment, such as ultrasound. Your caregiver may advise that you wear a back brace or corset, to help reduce pain and discomfort. Often, prolonged bed rest results in greater harm then benefit. Corticosteroid injections may be  recommended. However, these should be reserved for the most serious cases. It is important to avoid using your back when lifting objects. At night, sleep on your back on a firm mattress with a pillow placed under your knees. If non-surgical treatment is unsuccessful, surgery may be needed.  MEDICATION   If pain medicine is needed, nonsteroidal anti-inflammatory medicines (aspirin and ibuprofen), or other minor pain relievers (acetaminophen), are often advised.  Do not take pain medicine for 7 days before surgery.  Prescription pain relievers may be given, if your caregiver thinks they are needed. Use only as directed and only as much as you need.  Ointments applied to the skin may be helpful.  Corticosteroid injections may be given by your caregiver. These injections should be reserved for the most serious cases, because they may only be given a certain number of times. HEAT AND COLD  Cold treatment (icing) should be applied for 10 to 15 minutes every 2 to 3 hours for inflammation and pain, and immediately after activity that aggravates your symptoms. Use ice packs or an ice massage.  Heat treatment may be used before performing stretching and strengthening activities prescribed by your caregiver, physical therapist, or athletic trainer. Use a heat pack or a warm water soak. SEEK MEDICAL CARE IF:   Symptoms get worse or do not improve in 2 to 4 weeks, despite treatment.  You develop numbness, weakness, or loss of bowel or bladder function.  New, unexplained symptoms develop. (Drugs used in treatment may produce side effects.) EXERCISES  RANGE OF MOTION (ROM) AND STRETCHING EXERCISES - Low Back Strain Most people with lower back pain will find that their symptoms get worse with excessive bending forward (flexion) or arching at the lower back (extension). The exercises which will help resolve your symptoms will focus on the opposite motion.  Your physician, physical therapist or athletic  trainer will help you determine which exercises will be most helpful to resolve your lower back pain. Do not complete any exercises without first consulting with your caregiver. Discontinue any exercises which make your symptoms worse until you speak to your caregiver.  If you have pain, numbness or tingling which travels down into your buttocks, leg or foot, the goal of the therapy is for these symptoms to move closer to your back and eventually resolve. Sometimes, these leg symptoms will get better, but your lower back pain may worsen. This is typically an indication of progress in your rehabilitation. Be very alert to any changes in your symptoms and the activities in which you participated in the 24 hours prior to the change. Sharing this information with your caregiver will allow him/her to most efficiently treat your condition.  These exercises may help you when beginning to rehabilitate your injury. Your symptoms may resolve with or without further involvement from your physician, physical therapist or athletic trainer. While completing these exercises, remember:  Restoring tissue flexibility helps normal motion to return to the joints. This allows healthier, less painful movement and activity.  An effective stretch should be held for at least 30 seconds.  A stretch should never be painful. You should only feel a gentle lengthening or release in the stretched tissue. FLEXION RANGE OF MOTION AND STRETCHING EXERCISES: STRETCH - Flexion, Single Knee to Chest     Lie on a firm bed or floor with both legs extended in front of you.  Keeping one leg in contact with the floor, bring your opposite knee to your chest. Hold your leg in place by either grabbing behind your thigh or at your knee.  Pull until you feel a gentle stretch in your lower back. Hold __________ seconds.  Slowly release your grasp and repeat the exercise with the opposite side. Repeat __________ times. Complete this exercise  __________ times per day.  STRETCH - Flexion, Double Knee to Chest   Lie on a firm bed or floor with both legs extended in front of you.  Keeping one leg in contact with the floor, bring your opposite knee to your chest.  Tense your stomach muscles to support your back and then lift your other knee to your chest. Hold your legs in place by either grabbing behind your thighs or at your knees.  Pull both knees toward your chest until you feel a gentle stretch in your lower back. Hold __________ seconds.  Tense your stomach muscles and slowly return one leg at a time to the floor. Repeat __________ times. Complete this exercise __________ times per day.  STRETCH - Low Trunk Rotation  Lie on a firm bed or floor. Keeping your legs in front of you, bend your knees so they are both pointed toward the ceiling and your feet are flat on the floor.  Extend your arms out to the side. This will stabilize your upper body by keeping your shoulders in contact with the floor.  Gently and slowly drop both knees together to one side until you feel a gentle stretch in your lower back. Hold for __________ seconds.  Tense your stomach muscles to support your lower back as you bring your knees back to the starting position. Repeat the exercise to the other side. Repeat __________ times. Complete this exercise __________ times per day  EXTENSION RANGE OF MOTION AND FLEXIBILITY EXERCISES: STRETCH - Extension, Prone on Elbows   Lie on your stomach on the floor, a bed will be too soft. Place your palms about shoulder width apart and at the height of your head.  Place your elbows under your shoulders. If this is too painful, stack pillows under your chest.  Allow your body to relax so that your hips drop lower and make contact more completely with the floor.  Hold this position for __________ seconds.  Slowly return to lying flat on the floor. Repeat __________ times. Complete this exercise __________ times  per day.  RANGE OF MOTION - Extension, Prone Press Ups  Lie on your stomach on the floor, a bed will be too soft. Place your palms about shoulder width apart and at the height of your head.  Keeping your back as relaxed as possible, slowly straighten your elbows while keeping your hips on the floor. You may adjust the placement of your hands to maximize your comfort. As you gain motion, your hands will come more underneath your shoulders.  Hold this position __________ seconds.  Slowly return to lying flat on the floor. Repeat __________ times. Complete this exercise __________ times per day.  RANGE OF MOTION- Quadruped, Neutral Spine   Assume a hands and knees position on a firm surface. Keep your hands under your shoulders and your knees under your hips. You may place padding under your knees for comfort.  Drop your head and point your tail bone toward the ground below you. This will round out  your lower back like an angry cat. Hold this position for __________ seconds.  Slowly lift your head and release your tail bone so that your back sags into a large arch, like an old horse.  Hold this position for __________ seconds.  Repeat this until you feel limber in your lower back.  Now, find your "sweet spot." This will be the most comfortable position somewhere between the two previous positions. This is your neutral spine. Once you have found this position, tense your stomach muscles to support your lower back.  Hold this position for __________ seconds. Repeat __________ times. Complete this exercise __________ times per day.  STRENGTHENING EXERCISES - Low Back Strain These exercises may help you when beginning to rehabilitate your injury. These exercises should be done near your "sweet spot." This is the neutral, low-back arch, somewhere between fully rounded and fully arched, that is your least painful position. When performed in this safe range of motion, these exercises can be used  for people who have either a flexion or extension based injury. These exercises may resolve your symptoms with or without further involvement from your physician, physical therapist or athletic trainer. While completing these exercises, remember:   Muscles can gain both the endurance and the strength needed for everyday activities through controlled exercises.  Complete these exercises as instructed by your physician, physical therapist or athletic trainer. Increase the resistance and repetitions only as guided.  You may experience muscle soreness or fatigue, but the pain or discomfort you are trying to eliminate should never worsen during these exercises. If this pain does worsen, stop and make certain you are following the directions exactly. If the pain is still present after adjustments, discontinue the exercise until you can discuss the trouble with your caregiver. STRENGTHENING - Deep Abdominals, Pelvic Tilt  Lie on a firm bed or floor. Keeping your legs in front of you, bend your knees so they are both pointed toward the ceiling and your feet are flat on the floor.  Tense your lower abdominal muscles to press your lower back into the floor. This motion will rotate your pelvis so that your tail bone is scooping upwards rather than pointing at your feet or into the floor.  With a gentle tension and even breathing, hold this position for __________ seconds. Repeat __________ times. Complete this exercise __________ times per day.  STRENGTHENING - Abdominals, Crunches   Lie on a firm bed or floor. Keeping your legs in front of you, bend your knees so they are both pointed toward the ceiling and your feet are flat on the floor. Cross your arms over your chest.  Slightly tip your chin down without bending your neck.  Tense your abdominals and slowly lift your trunk high enough to just clear your shoulder blades. Lifting higher can put excessive stress on the lower back and does not further  strengthen your abdominal muscles.  Control your return to the starting position. Repeat __________ times. Complete this exercise __________ times per day.  STRENGTHENING - Quadruped, Opposite UE/LE Lift   Assume a hands and knees position on a firm surface. Keep your hands under your shoulders and your knees under your hips. You may place padding under your knees for comfort.  Find your neutral spine and gently tense your abdominal muscles so that you can maintain this position. Your shoulders and hips should form a rectangle that is parallel with the floor and is not twisted.  Keeping your trunk steady, lift your right   hand no higher than your shoulder and then your left leg no higher than your hip. Make sure you are not holding your breath. Hold this position __________ seconds.  Continuing to keep your abdominal muscles tense and your back steady, slowly return to your starting position. Repeat with the opposite arm and leg. Repeat __________ times. Complete this exercise __________ times per day.  STRENGTHENING - Lower Abdominals, Double Knee Lift  Lie on a firm bed or floor. Keeping your legs in front of you, bend your knees so they are both pointed toward the ceiling and your feet are flat on the floor.  Tense your abdominal muscles to brace your lower back and slowly lift both of your knees until they come over your hips. Be certain not to hold your breath.  Hold __________ seconds. Using your abdominal muscles, return to the starting position in a slow and controlled manner. Repeat __________ times. Complete this exercise __________ times per day.  POSTURE AND BODY MECHANICS CONSIDERATIONS - Low Back Strain Keeping correct posture when sitting, standing or completing your activities will reduce the stress put on different body tissues, allowing injured tissues a chance to heal and limiting painful experiences. The following are general guidelines for improved posture. Your physician  or physical therapist will provide you with any instructions specific to your needs. While reading these guidelines, remember:  The exercises prescribed by your provider will help you have the flexibility and strength to maintain correct postures.  The correct posture provides the best environment for your joints to work. All of your joints have less wear and tear when properly supported by a spine with good posture. This means you will experience a healthier, less painful body.  Correct posture must be practiced with all of your activities, especially prolonged sitting and standing. Correct posture is as important when doing repetitive low-stress activities (typing) as it is when doing a single heavy-load activity (lifting). RESTING POSITIONS Consider which positions are most painful for you when choosing a resting position. If you have pain with flexion-based activities (sitting, bending, stooping, squatting), choose a position that allows you to rest in a less flexed posture. You would want to avoid curling into a fetal position on your side. If your pain worsens with extension-based activities (prolonged standing, working overhead), avoid resting in an extended position such as sleeping on your stomach. Most people will find more comfort when they rest with their spine in a more neutral position, neither too rounded nor too arched. Lying on a non-sagging bed on your side with a pillow between your knees, or on your back with a pillow under your knees will often provide some relief. Keep in mind, being in any one position for a prolonged period of time, no matter how correct your posture, can still lead to stiffness. PROPER SITTING POSTURE In order to minimize stress and discomfort on your spine, you must sit with correct posture. Sitting with good posture should be effortless for a healthy body. Returning to good posture is a gradual process. Many people can work toward this most comfortably by using  various supports until they have the flexibility and strength to maintain this posture on their own. When sitting with proper posture, your ears will fall over your shoulders and your shoulders will fall over your hips. You should use the back of the chair to support your upper back. Your lower back will be in a neutral position, just slightly arched. You may place a small pillow or   folded towel at the base of your lower back for support.  When working at a desk, create an environment that supports good, upright posture. Without extra support, muscles tire, which leads to excessive strain on joints and other tissues. Keep these recommendations in mind: CHAIR:  A chair should be able to slide under your desk when your back makes contact with the back of the chair. This allows you to work closely.  The chair's height should allow your eyes to be level with the upper part of your monitor and your hands to be slightly lower than your elbows. BODY POSITION  Your feet should make contact with the floor. If this is not possible, use a foot rest.  Keep your ears over your shoulders. This will reduce stress on your neck and lower back. INCORRECT SITTING POSTURES  If you are feeling tired and unable to assume a healthy sitting posture, do not slouch or slump. This puts excessive strain on your back tissues, causing more damage and pain. Healthier options include:  Using more support, like a lumbar pillow.  Switching tasks to something that requires you to be upright or walking.  Talking a brief walk.  Lying down to rest in a neutral-spine position. PROLONGED STANDING WHILE SLIGHTLY LEANING FORWARD  When completing a task that requires you to lean forward while standing in one place for a long time, place either foot up on a stationary 2-4 inch high object to help maintain the best posture. When both feet are on the ground, the lower back tends to lose its slight inward curve. If this curve flattens (or  becomes too large), then the back and your other joints will experience too much stress, tire more quickly, and can cause pain. CORRECT STANDING POSTURES Proper standing posture should be assumed with all daily activities, even if they only take a few moments, like when brushing your teeth. As in sitting, your ears should fall over your shoulders and your shoulders should fall over your hips. You should keep a slight tension in your abdominal muscles to brace your spine. Your tailbone should point down to the ground, not behind your body, resulting in an over-extended swayback posture.  INCORRECT STANDING POSTURES  Common incorrect standing postures include a forward head, locked knees and/or an excessive swayback. WALKING Walk with an upright posture. Your ears, shoulders and hips should all line-up. PROLONGED ACTIVITY IN A FLEXED POSITION When completing a task that requires you to bend forward at your waist or lean over a low surface, try to find a way to stabilize 3 out of 4 of your limbs. You can place a hand or elbow on your thigh or rest a knee on the surface you are reaching across. This will provide you more stability so that your muscles do not fatigue as quickly. By keeping your knees relaxed, or slightly bent, you will also reduce stress across your lower back. CORRECT LIFTING TECHNIQUES DO :   Assume a wide stance. This will provide you more stability and the opportunity to get as close as possible to the object which you are lifting.  Tense your abdominals to brace your spine. Bend at the knees and hips. Keeping your back locked in a neutral-spine position, lift using your leg muscles. Lift with your legs, keeping your back straight.  Test the weight of unknown objects before attempting to lift them.  Try to keep your elbows locked down at your sides in order get the best strength from your   shoulders when carrying an object.  Always ask for help when lifting heavy or awkward  objects. INCORRECT LIFTING TECHNIQUES DO NOT:   Lock your knees when lifting, even if it is a small object.  Bend and twist. Pivot at your feet or move your feet when needing to change directions.  Assume that you can safely pick up even a paper clip without proper posture.   This information is not intended to replace advice given to you by your health care provider. Make sure you discuss any questions you have with your health care provider.   Document Released: 04/15/2005 Document Revised: 05/06/2014 Document Reviewed: 07/28/2008 [employer] Interactive Patient Education 2016 [employer]  Generic Knee Exercises EXERCISES RANGE OF MOTION (ROM) AND STRETCHING EXERCISES These exercises may help you when beginning to rehabilitate your injury. Your symptoms may resolve with or without further involvement from your physician, physical therapist, or athletic trainer. While completing these exercises, remember:   Restoring tissue flexibility helps normal motion to return to the joints. This allows healthier, less painful movement and activity.  An effective stretch should be held for at least 30 seconds.  A stretch should never be painful. You should only feel a gentle lengthening or release in the stretched tissue. STRETCH - Knee Extension, Prone  Lie on your stomach on a firm surface, such as a bed or countertop. Place your right / left knee and leg just beyond the edge of the surface. You may wish to place a towel under the far end of your right / left thigh for comfort.  Relax your leg muscles and allow gravity to straighten your knee. Your clinician may advise you to add an ankle weight if more resistance is helpful for you.  You should feel a stretch in the back of your right / left knee. Hold this position for __________ seconds. Repeat __________ times. Complete this stretch __________ times per day. * Your physician, physical therapist, or athletic trainer may ask you to add  ankle weight to enhance your stretch.  RANGE OF MOTION - Knee Flexion, Active  Lie on your back with both knees straight. (If this causes back discomfort, bend your opposite knee, placing your foot flat on the floor.)  Slowly slide your heel back toward your buttocks until you feel a gentle stretch in the front of your knee or thigh.  Hold for __________ seconds. Slowly slide your heel back to the starting position. Repeat __________ times. Complete this exercise __________ times per day.  STRETCH - Quadriceps, Prone   Lie on your stomach on a firm surface, such as a bed or padded floor.  Bend your right / left knee and grasp your ankle. If you are unable to reach your ankle or pant leg, use a belt around your foot to lengthen your reach.  Gently pull your heel toward your buttocks. Your knee should not slide out to the side. You should feel a stretch in the front of your thigh and/or knee.  Hold this position for __________ seconds. Repeat __________ times. Complete this stretch __________ times per day.  STRETCH - Hamstrings, Supine   Lie on your back. Loop a belt or towel over the ball of your right / left foot.  Straighten your right / left knee and slowly pull on the belt to raise your leg. Do not allow the right / left knee to bend. Keep your opposite leg flat on the floor.  Raise the leg until you feel a gentle stretch  behind your right / left knee or thigh. Hold this position for __________ seconds. Repeat __________ times. Complete this stretch __________ times per day.  STRENGTHENING EXERCISES These exercises may help you when beginning to rehabilitate your injury. They may resolve your symptoms with or without further involvement from your physician, physical therapist, or athletic trainer. While completing these exercises, remember:   Muscles can gain both the endurance and the strength needed for everyday activities through controlled exercises.  Complete these exercises  as instructed by your physician, physical therapist, or athletic trainer. Progress the resistance and repetitions only as guided.  You may experience muscle soreness or fatigue, but the pain or discomfort you are trying to eliminate should never worsen during these exercises. If this pain does worsen, stop and make certain you are following the directions exactly. If the pain is still present after adjustments, discontinue the exercise until you can discuss the trouble with your clinician. STRENGTH - Quadriceps, Isometrics  Lie on your back with your right / left leg extended and your opposite knee bent.  Gradually tense the muscles in the front of your right / left thigh. You should see either your knee cap slide up toward your hip or increased dimpling just above the knee. This motion will push the back of the knee down toward the floor/mat/bed on which you are lying.  Hold the muscle as tight as you can without increasing your pain for __________ seconds.  Relax the muscles slowly and completely in between each repetition. Repeat __________ times. Complete this exercise __________ times per day.  STRENGTH - Quadriceps, Short Arcs   Lie on your back. Place a __________ inch towel roll under your knee so that the knee slightly bends.  Raise only your lower leg by tightening the muscles in the front of your thigh. Do not allow your thigh to rise.  Hold this position for __________ seconds. Repeat __________ times. Complete this exercise __________ times per day.  OPTIONAL ANKLE WEIGHTS: Begin with ____________________, but DO NOT exceed ____________________. Increase in 1 pound/0.5 kilogram increments.  STRENGTH - Quadriceps, Straight Leg Raises  Quality counts! Watch for signs that the quadriceps muscle is working to insure you are strengthening the correct muscles and not "cheating" by substituting with healthier muscles.  Lay on your back with your right / left leg extended and your  opposite knee bent.  Tense the muscles in the front of your right / left thigh. You should see either your knee cap slide up or increased dimpling just above the knee. Your thigh may even quiver.  Tighten these muscles even more and raise your leg 4 to 6 inches off the floor. Hold for __________ seconds.  Keeping these muscles tense, lower your leg.  Relax the muscles slowly and completely in between each repetition. Repeat __________ times. Complete this exercise __________ times per day.  STRENGTH - Hamstring, Curls  Lay on your stomach with your legs extended. (If you lay on a bed, your feet may hang over the edge.)  Tighten the muscles in the back of your thigh to bend your right / left knee up to 90 degrees. Keep your hips flat on the bed/floor.  Hold this position for __________ seconds.  Slowly lower your leg back to the starting position. Repeat __________ times. Complete this exercise __________ times per day.  OPTIONAL ANKLE WEIGHTS: Begin with ____________________, but DO NOT exceed ____________________. Increase in 1 pound/0.5 kilogram increments.  STRENGTH - Quadriceps, Squats  Stand in  a door frame so that your feet and knees are in line with the frame.  Use your hands for balance, not support, on the frame.  Slowly lower your weight, bending at the hips and knees. Keep your lower legs upright so that they are parallel with the door frame. Squat only within the range that does not increase your knee pain. Never let your hips drop below your knees.  Slowly return upright, pushing with your legs, not pulling with your hands. Repeat __________ times. Complete this exercise __________ times per day.  STRENGTH - Quadriceps, Wall Slides  Follow guidelines for form closely. Increased knee pain often results from poorly placed feet or knees.  Lean against a smooth wall or door and walk your feet out 18-24 inches. Place your feet hip-width apart.  Slowly slide down the wall  or door until your knees bend __________ degrees.* Keep your knees over your heels, not your toes, and in line with your hips, not falling to either side.  Hold for __________ seconds. Stand up to rest for __________ seconds in between each repetition. Repeat __________ times. Complete this exercise __________ times per day. * Your physician, physical therapist, or athletic trainer will alter this angle based on your symptoms and progress.   This information is not intended to replace advice given to you by your health care provider. Make sure you discuss any questions you have with your health care provider.   Document Released: 02/27/2005 Document Revised: 05/06/2014 Document Reviewed: 07/28/2008 Elsevier Interactive Patient Education [employer].

## 2015-08-01 NOTE — Assessment & Plan Note (Signed)
Symptoms and exam consistent with strain of paraspinal musculature with no spasm evident. Treat with ice and initiate home exercise therapy. Tylenol as needed for discomfort. Start Pennsaid as needed with risk as described under knee pain. Follow up as needed or if symptoms do not improve.

## 2015-08-04 IMAGING — CR DG CHEST 1V PORT
1 series · 1 of 1 positions shown · non-contrast
Comparison: Radiographs 06/21/2014 and 08/10/2014.  CT 06/22/2014.

CLINICAL DATA: Status post revision of mitral valve replacement.

EXAM:
PORTABLE CHEST - 1 VIEW

[AP]
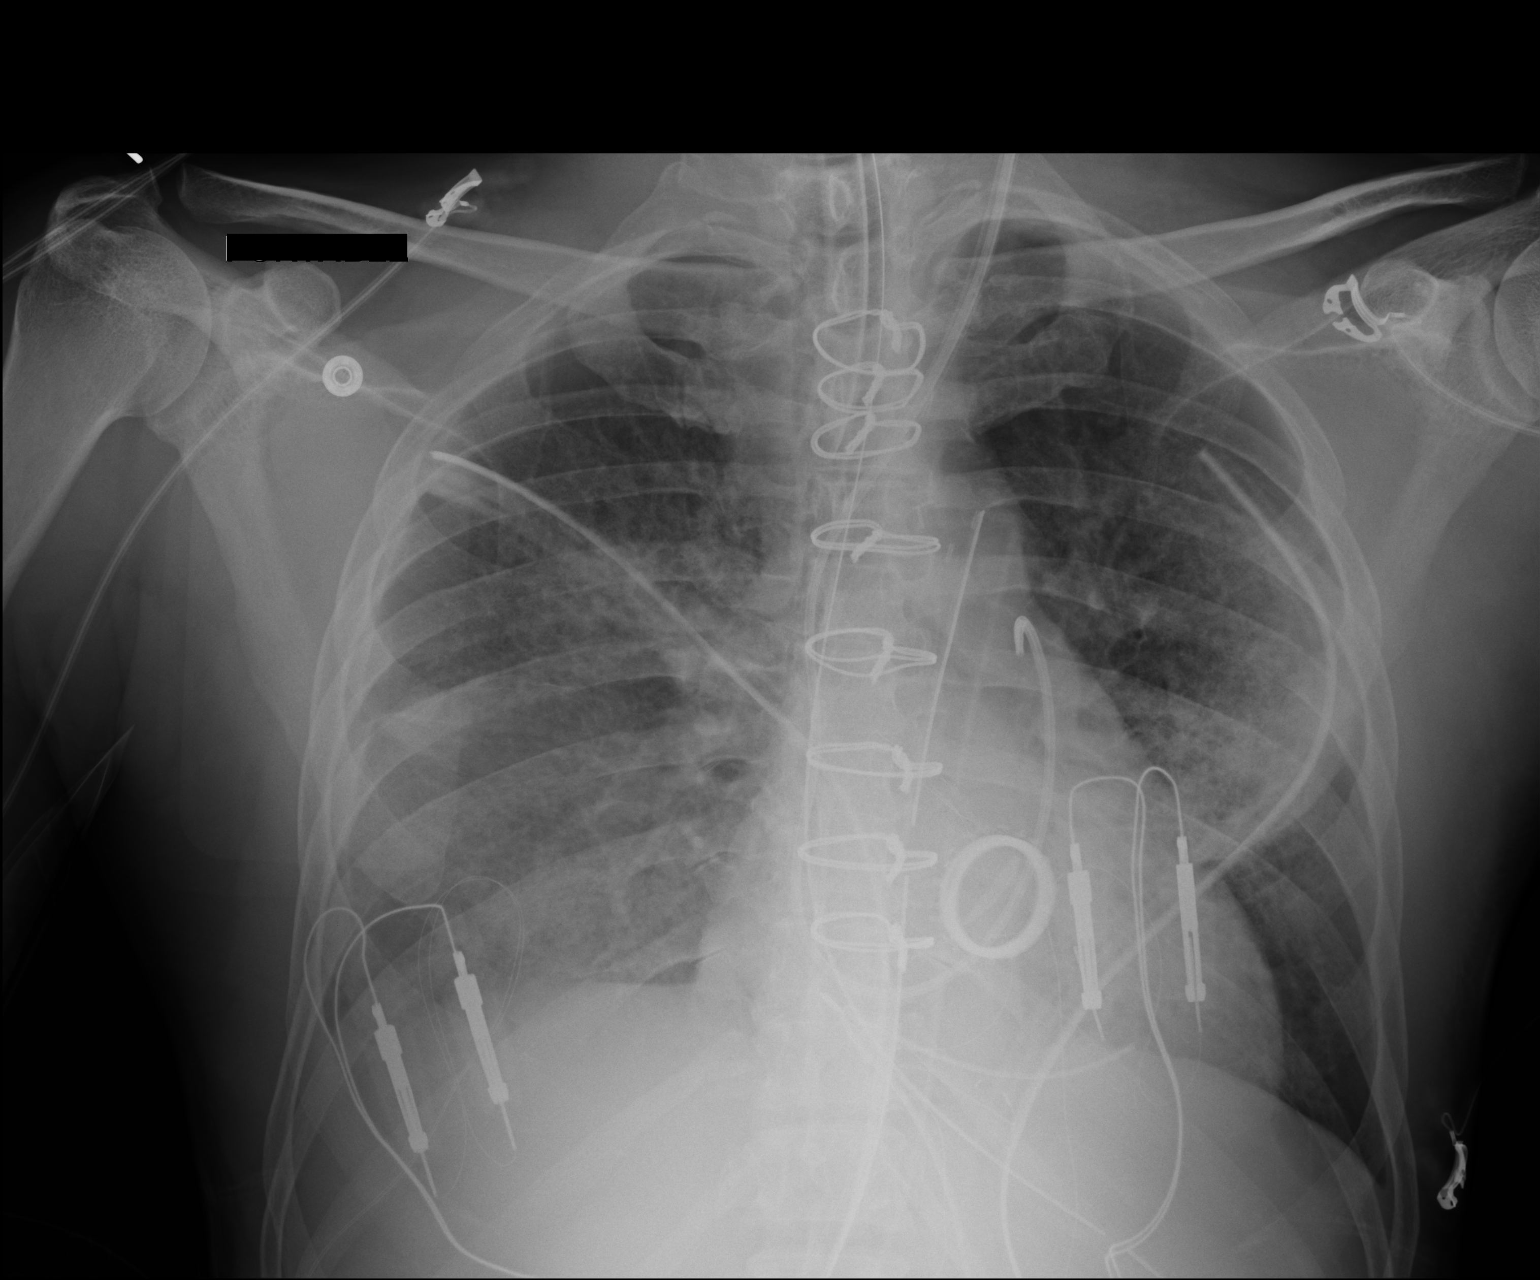

[1 of 1 positions shown; findings below may reference images not displayed]

FINDINGS: 8167 hour. Interval median sternotomy and revision of mitral
annuloplasty. Endotracheal tube tip is 2.7 cm above the carina. Left
IJ Swan-Ganz catheter projects into the proximal left pulmonary
artery. A nasogastric tube projects below the diaphragm. Mediastinal
drain and bilateral chest tubes are in place. There is mild
pulmonary edema with a possible small right pleural effusion. No
evidence of pneumothorax.
IMPRESSION: Postop revision of mitral valve replacement as described. Probable
edema and small right pleural effusion.

## 2015-08-07 IMAGING — CR DG CHEST 1V PORT
1 series · 1 of 1 positions shown · non-contrast
Comparison: 08/13/2014

CLINICAL DATA: Postop day 4 following cardiac surgery and mitral
valve replacement.

EXAM:
PORTABLE CHEST - 1 VIEW

[AP]
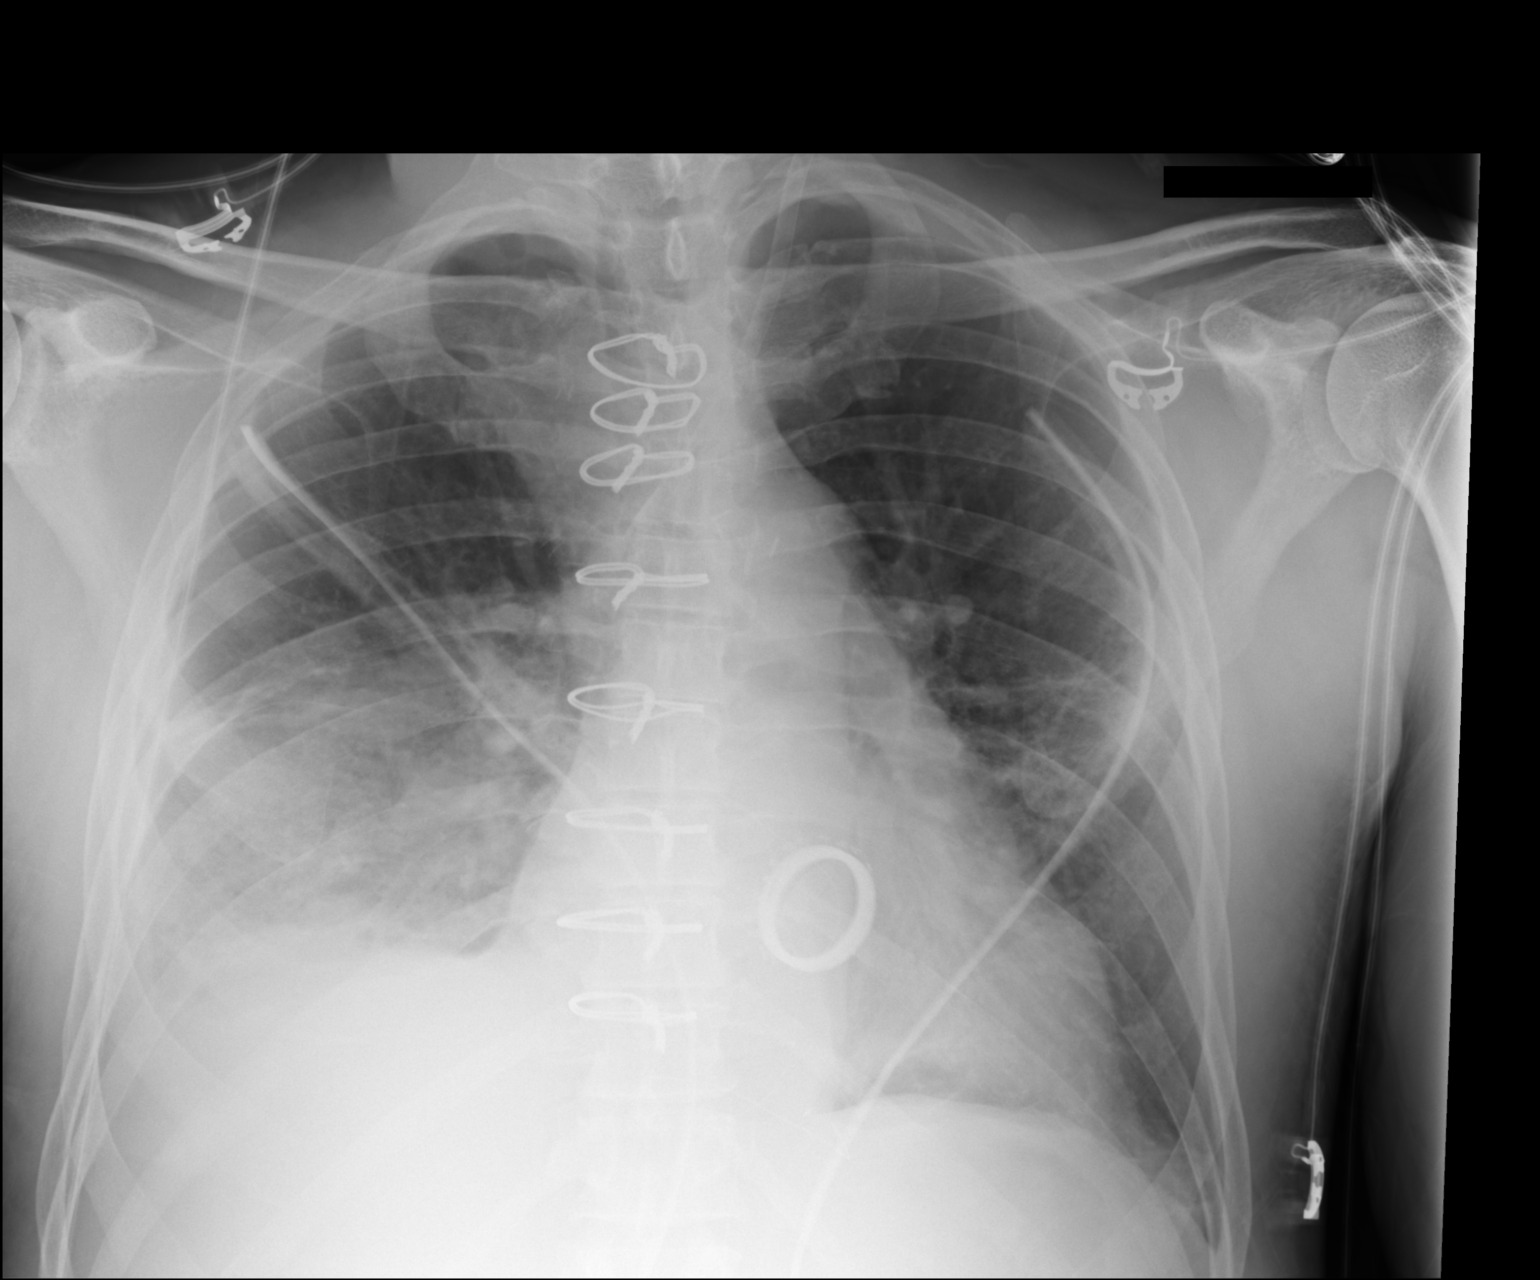

[1 of 1 positions shown; findings below may reference images not displayed]

FINDINGS: Cardiac silhouette is mildly enlarged. There is no mediastinal
widening.

There is persistent opacity at the right lung base obscuring the
hemidiaphragm consistent with a combination of pleural fluid with
parenchymal opacity, likely atelectasis, residual asymmetric edema
or a combination. Mild atelectasis is noted at the left lung base
and adjacent to the left chest tube. There is no pneumothorax.

Left internal jugular introducer sheath is stable as are the
bilateral chest tubes. Mediastinal tube has been removed.
IMPRESSION: 1. Persistent right lower lung zone opacity likely combination of
pleural fluid with atelectasis and/or asymmetric pulmonary edema.
Aeration of the lungs is similar to the previous day's study. No new
lung opacities.
2. No pneumothorax. No mediastinal widening. Remaining support
apparatus is well positioned.

## 2015-08-21 ENCOUNTER — Ambulatory Visit (INDEPENDENT_AMBULATORY_CARE_PROVIDER_SITE_OTHER): Payer: 59 | Admitting: Thoracic Surgery (Cardiothoracic Vascular Surgery)

## 2015-08-21 ENCOUNTER — Encounter: Payer: Self-pay | Admitting: Thoracic Surgery (Cardiothoracic Vascular Surgery)

## 2015-08-21 VITALS — BP 133/82 | HR 87 | Resp 16 | Ht 70.0 in | Wt 175.0 lb

## 2015-08-21 DIAGNOSIS — C61 Malignant neoplasm of prostate: Secondary | ICD-10-CM | POA: Diagnosis not present

## 2015-08-21 DIAGNOSIS — Z954 Presence of other heart-valve replacement: Secondary | ICD-10-CM

## 2015-08-21 NOTE — Progress Notes (Signed)
[address]       [place] [address]             [phone]     CARDIOTHORACIC SURGERY OFFICE NOTE  Referring Provider is Lelon Perla, MD PCP is Unice Cobble, MD   HPI:  Patient is a 55 year old male who returns for routine follow-up approximately one year status post redo mitral valve replacement using a mechanical prosthetic valve on 08/11/2014 for severe symptomatic recurrent primary mitral regurgitation with acute diastolic congestive heart failure and hemolytic anemia. His postoperative recovery was uncomplicated and he was last seen here in our office on 09/19/2014 at which time he was doing well.  Since then he has been followed closely by Dr. Stanford Breed who also is following him for small benign-appearing pulmonary nodules. He is scheduled for follow-up CT scan next November. From a cardiac standpoint the patient has been doing remarkably well. He reports normal exercise tolerance with no limitations whatsoever. He specifically denies any symptoms of exertional shortness of breath or chest discomfort. He has not had any problems with long-term Coumadin therapy. He did undergo radioactive seed implantation for prosthetic cancer several months ago.   Current Outpatient Prescriptions  Medication Sig Dispense Refill  . aspirin EC 81 MG EC tablet Take 1 tablet (81 mg total) by mouth daily.    . Diclofenac Sodium (PENNSAID) 2 % SOLN Place 1 application onto the skin 2 (two) times daily as needed. 112 g 1  . enoxaparin (LOVENOX) 80 MG/0.8ML injection INJECT 0.8 MLS (80 MG TOTAL) INTO THE SKIN EVERY 12 (TWELVE) HOURS.  0  . loratadine (CLARITIN) 10 MG tablet Take 1 tablet (10 mg total) by mouth daily. (Patient taking differently: Take 10 mg by mouth every morning. ) 90 tablet 3  . metoprolol succinate (TOPROL XL) 25 MG 24 hr tablet Take 1 tablet (25 mg total) by mouth daily. (Patient taking differently: Take 25 mg by mouth every evening. ) 90 tablet 3  .  tadalafil (CIALIS) 20 MG tablet Take 0.5-1 tablets (10-20 mg total) by mouth daily as needed for erectile dysfunction. 10 tablet 5  . tamsulosin (FLOMAX) 0.4 MG CAPS capsule Take 1 capsule (0.4 mg total) by mouth daily. 30 capsule 2  . warfarin (COUMADIN) 5 MG tablet Take 1-1.5 tablets by mouth daily as directed by coumadin clinic 120 tablet 1   No current facility-administered medications for this visit.      Physical Exam:   BP 133/82 mmHg  Pulse 87  Resp 16  Ht 5\' 10"  (1.778 m)  Wt 175 lb (79.379 kg)  BMI 25.11 kg/m2  SpO2 98%  General:  Well-appearing  Chest:   Clear to auscultation  CV:   Regular rate and rhythm with mechanical heart valve sounds  Incisions:  Completely healed, sternum is stable  Abdomen:  Soft nontender  Extremities:  Warm and well-perfused  Diagnostic Tests:  N/A   Impression:  Patient is doing very well approximately one year status post redo mitral valve replacement using a bileaflet mechanical prosthetic valve.  Plan:  In the future the patient will call and return to see [place] as needed.  The patient has been reminded regarding the importance of dental hygiene and the lifelong need for antibiotic prophylaxis for all dental cleanings and other related invasive procedures.  I spent in excess of 10 minutes during the conduct of this office consultation and >50% of this time involved direct face-to-face encounter with the patient for  counseling and/or coordination of their care.   Valentina Gu. Roxy Manns, MD 08/21/2015 11:03 AM

## 2015-08-21 NOTE — Patient Instructions (Signed)

## 2015-08-22 ENCOUNTER — Ambulatory Visit (INDEPENDENT_AMBULATORY_CARE_PROVIDER_SITE_OTHER): Payer: 59 | Admitting: Pharmacist Clinician (PhC)/ Clinical Pharmacy Specialist

## 2015-08-22 DIAGNOSIS — Z954 Presence of other heart-valve replacement: Secondary | ICD-10-CM

## 2015-08-22 DIAGNOSIS — Z7901 Long term (current) use of anticoagulants: Secondary | ICD-10-CM

## 2015-08-22 DIAGNOSIS — I059 Rheumatic mitral valve disease, unspecified: Secondary | ICD-10-CM | POA: Diagnosis not present

## 2015-08-22 LAB — POCT INR: INR: 3.5

## 2015-09-03 ENCOUNTER — Other Ambulatory Visit: Payer: Self-pay | Admitting: Cardiology

## 2015-09-04 NOTE — Telephone Encounter (Signed)
Rx request sent to pharmacy.  

## 2015-10-03 ENCOUNTER — Ambulatory Visit (INDEPENDENT_AMBULATORY_CARE_PROVIDER_SITE_OTHER): Payer: 59 | Admitting: Pharmacist

## 2015-10-03 DIAGNOSIS — Z7901 Long term (current) use of anticoagulants: Secondary | ICD-10-CM | POA: Diagnosis not present

## 2015-10-03 DIAGNOSIS — I059 Rheumatic mitral valve disease, unspecified: Secondary | ICD-10-CM

## 2015-10-03 DIAGNOSIS — Z954 Presence of other heart-valve replacement: Secondary | ICD-10-CM | POA: Diagnosis not present

## 2015-10-03 LAB — POCT INR: INR: 5.8

## 2015-10-24 ENCOUNTER — Ambulatory Visit (INDEPENDENT_AMBULATORY_CARE_PROVIDER_SITE_OTHER): Payer: 59 | Admitting: Pharmacist

## 2015-10-24 DIAGNOSIS — Z954 Presence of other heart-valve replacement: Secondary | ICD-10-CM

## 2015-10-24 DIAGNOSIS — Z7901 Long term (current) use of anticoagulants: Secondary | ICD-10-CM

## 2015-10-24 DIAGNOSIS — I059 Rheumatic mitral valve disease, unspecified: Secondary | ICD-10-CM | POA: Diagnosis not present

## 2015-10-24 LAB — POCT INR: INR: 2.8

## 2015-12-05 ENCOUNTER — Ambulatory Visit (INDEPENDENT_AMBULATORY_CARE_PROVIDER_SITE_OTHER): Payer: 59 | Admitting: Pharmacist Clinician (PhC)/ Clinical Pharmacy Specialist

## 2015-12-05 DIAGNOSIS — Z7901 Long term (current) use of anticoagulants: Secondary | ICD-10-CM

## 2015-12-05 DIAGNOSIS — I059 Rheumatic mitral valve disease, unspecified: Secondary | ICD-10-CM | POA: Diagnosis not present

## 2015-12-05 DIAGNOSIS — Z954 Presence of other heart-valve replacement: Secondary | ICD-10-CM | POA: Diagnosis not present

## 2015-12-05 LAB — POCT INR: INR: 2

## 2015-12-07 ENCOUNTER — Telehealth: Payer: Self-pay | Admitting: Cardiology

## 2015-12-07 ENCOUNTER — Encounter: Payer: Self-pay | Admitting: Cardiology

## 2015-12-07 ENCOUNTER — Ambulatory Visit (INDEPENDENT_AMBULATORY_CARE_PROVIDER_SITE_OTHER): Payer: 59 | Admitting: Cardiology

## 2015-12-07 VITALS — BP 114/82 | HR 80 | Ht 70.0 in | Wt 167.0 lb

## 2015-12-07 DIAGNOSIS — Z9889 Other specified postprocedural states: Secondary | ICD-10-CM | POA: Diagnosis not present

## 2015-12-07 DIAGNOSIS — R911 Solitary pulmonary nodule: Secondary | ICD-10-CM

## 2015-12-07 DIAGNOSIS — R072 Precordial pain: Secondary | ICD-10-CM

## 2015-12-07 NOTE — Progress Notes (Signed)
HPI: FU MVR. He has history of endocarditis. In November of 2009 he had mitral valve repair via a mini thoracotomy. Transesophageal echocardiogram 3/16 revealed normal LV function, s/p MV repair with severe mitral regurgitation directed posteriorly and laterally possibly from perforated posterior mitral valve leaflet. There was a smaller second jet as well.Cath 4/16 showed EF 55, severe MR, severe pulm HTN, 40 LAD. Pt had MVR (bileaflet mechanical prosthesis) 4/16. Echocardiogram May 2016 showed mildly reduced LV function, mechanical mitral valve with no significant mitral regurgitation and mean gradient 6 mmHg. There was mild left atrial enlargement. There was mild to moderate pulmonic insufficiency. Follow-up CT Nov 2016 showed stable right upper lobe, right lower lobe and left upper lobe nodules. Follow-up recommended in 24 months. Since last seen, Over the past week he has had occasional pain in his left lateral chest area and breast area. It lasts approximately 1 minute and resolves spontaneously. It is not pleuritic, positional, exertional or related to food. No associated symptoms. He otherwise denies dyspnea on exertion, orthopnea, PND, pedal edema or exertional chest pain. No bleeding. Patient was added to my schedule for evaluation of chest pain.  Current Outpatient Prescriptions  Medication Sig Dispense Refill  . aspirin EC 81 MG EC tablet Take 1 tablet (81 mg total) by mouth daily.    . Diclofenac Sodium (PENNSAID) 2 % SOLN Place 1 application onto the skin 2 (two) times daily as needed. 112 g 1  . enoxaparin (LOVENOX) 80 MG/0.8ML injection INJECT 0.8 MLS (80 MG TOTAL) INTO THE SKIN EVERY 12 (TWELVE) HOURS.  0  . loratadine (CLARITIN) 10 MG tablet Take 1 tablet (10 mg total) by mouth daily. (Patient taking differently: Take 10 mg by mouth every morning. ) 90 tablet 3  . metoprolol succinate (TOPROL-XL) 25 MG 24 hr tablet TAKE 1 TABLET (25 MG TOTAL) BY MOUTH DAILY. 90 tablet 2  .  tadalafil (CIALIS) 20 MG tablet Take 0.5-1 tablets (10-20 mg total) by mouth daily as needed for erectile dysfunction. 10 tablet 5  . tamsulosin (FLOMAX) 0.4 MG CAPS capsule Take 1 capsule (0.4 mg total) by mouth daily. 30 capsule 2  . warfarin (COUMADIN) 5 MG tablet TAKE 1-1.5 TABLETS BY MOUTH DAILY AS DIRECTED BY COUMADIN CLINIC 120 tablet 1   No current facility-administered medications for this visit.      Past Medical History:  Diagnosis Date  . DDD (degenerative disc disease), lumbosacral   . Hemolytic anemia ([place]) 06/29/2014  . History of bacterial endocarditis    07/ 2001  enterococcal//  recurrence  11/ 2009  steptococcus viridans  . History of chest tube placement    1980's --- R SIDE  SPONTANEOUS  PNEUMOTHORAX  . History of Clostridium difficile colitis    2009  . History of periodontal disease   . Prostate cancer [facility]) urologist--  dr herrick/  oncologist-- dr Tammi Klippel   Stage  T1c,  Gleason 3+4,  PSA 3.76,  vol 33.45cc  . Pulmonary nodules followed by dr Stanford Breed   right upper & lower lobe and left lower lobe--  stable per CT 08-29-2014  . S/P redo mitral valve replacement with metallic valve cardiologist--  dr Stanford Breed   08-11-2014--- 31 mm Sorin Carbomedics Optiform bileaflet mechanical prosthesis  . Thoracic radiculopathy    T7  (positional)  per PCP note--  pt doing yoga and swimming state has helpted  . Wears glasses   . Wears partial dentures    upper    Past  Surgical History:  Procedure Laterality Date  . CARDIAC CATHETERIZATION  03-15-2008  dr Lia Foyer   no sig. coronary disease/  normal LVF/  moderate pulmonary hypertension w/ elevated V wave  . COLONOSCOPY  last one 12/ 2013  . ESOPHAGOGASTRODUODENOSCOPY N/A 06/02/2014   Procedure: ESOPHAGOGASTRODUODENOSCOPY (EGD);  Surgeon: Jerene Bears, MD;  Location: [facility] ENDOSCOPY;  Service: Endoscopy;  Laterality: N/A;  . LEFT AND RIGHT HEART CATHETERIZATION WITH CORONARY ANGIOGRAM N/A 08/10/2014   Procedure: LEFT AND RIGHT  HEART CATHETERIZATION WITH CORONARY ANGIOGRAM;  Surgeon: Belva Crome, MD;  Location: [facility] CATH LAB;  Service: Cardiovascular;  Laterality: N/A;   EF 55%,  severe MR, severe pulm HTN,  40% LAD  . MITRAL VALVE REPAIR  03-22-2008   via  right mini thoracotomy  . MITRAL VALVE REPAIR N/A 08/11/2014   Procedure: REDO MITRAL VALVE (MV) REPLACEMENT;  Surgeon: Rexene Alberts, MD;  Location: [place];  Service: Open Heart Surgery;  Laterality: N/A;  NO NECK LINES ON RIGHT SIDE  . MULTIPLE TOOTH EXTRACTIONS  03-16-2008    w/ alveoloplasty  . PROSTATE BIOPSY  03/2014  . RADIOACTIVE SEED IMPLANT N/A 03/17/2015   Procedure: RADIOACTIVE SEED IMPLANT/BRACHYTHERAPY IMPLANT;  Surgeon: Ardis Hughs, MD;  Location: [facility];  Service: Urology;  Laterality: N/A;  DR PORTABLE  . TEE WITHOUT CARDIOVERSION N/A 07/19/2014   Procedure: TRANSESOPHAGEAL ECHOCARDIOGRAM (TEE);  Surgeon: Lelon Perla, MD;  Location: [facility] ENDOSCOPY;  Service: Cardiovascular;  Laterality: N/A;  . TEE WITHOUT CARDIOVERSION N/A 08/11/2014   Procedure: TRANSESOPHAGEAL ECHOCARDIOGRAM (TEE);  Surgeon: Rexene Alberts, MD;  Location: [place];  Service: Open Heart Surgery;  Laterality: N/A;  . TRANSTHORACIC ECHOCARDIOGRAM  09-23-2014   dr  Stanford Breed   mild focal basal LVH of the septum,  ef 45-50%,  normal wall motion/  mechanical bileaflet MV sits well in mitrial position, no sig. regurg., valve area 1.61 cm^2,  mean grandient 59mmHg/  mild LAE/  RA prominent Chiari malformation , unchanged from prior study, normal size/  mild TR/  mild to moderate pulmonic insuffiency    Social History   Social History  . Marital status: Married    Spouse name: N/A  . Number of children: N/A  . Years of education: N/A   Occupational History  . 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 newspaper delivery     delivers papers to distributors, part time on weekends.    Social History Main Topics  . Smoking  status: Former Smoker    Packs/day: 2.00    Years: 15.00    Types: Cigarettes    Quit date: 08/08/1989  . Smokeless tobacco: Never Used  . Alcohol use No     Comment: "stopped drinking in 1991  . Drug use: No     Comment: "stopped using drugs in ~ 1984"  . Sexual activity: Not on file   Other Topics Concern  . Not on file   Social History Narrative   REGULAR EXERCISE 2X [employer] TREADMILL,WEIGHTS    Family History  Problem Relation Age of Onset  . Ovarian cancer Sister   . Alzheimer's disease Father   . Diabetes Neg Hx   . Heart disease Neg Hx   . Stroke Neg Hx     ROS: no fevers or chills, productive cough, hemoptysis, dysphasia, odynophagia, melena, hematochezia, dysuria, hematuria, rash, seizure activity, orthopnea, PND, pedal edema, claudication. Remaining systems are negative.  Physical Exam: Well-developed well-nourished  in no acute distress.  Skin is warm and dry.  HEENT is normal.  Neck is supple.  Chest is clear to auscultation with normal expansion.  Cardiovascular exam is regular rate and rhythm. Crisp mechanical valve sound with no murmur.   Abdominal exam nontender or distended. No masses palpated. Extremities show no edema. neuro grossly intact  ECG Sinus rhythm with first-degree AV block. Left bundle branch block.  A/P  1 Chest pain-symptoms are atypical. Pain does not sound cardiac. May be musculoskeletal. We will not pursue further evaluation unless symptoms persist.  2 status post mitral valve replacement-continue Coumadin and aspirin. Continue SBE prophylaxis.  3 lung nodule-plan follow-up CT scan November 2018 as recommended.  Kirk Ruths, MD

## 2015-12-07 NOTE — Patient Instructions (Signed)
Recall scheduled for May 17, 2016 for your 1 year follow up.  If you need a refill on your cardiac medications before your next appointment, please call your pharmacy.

## 2015-12-07 NOTE — Telephone Encounter (Signed)
New message      Pt c/o of Chest Pain: 1. Are you having CP right now? no 2. Are you experiencing any other symptoms (ex. SOB, nausea, vomiting, sweating)?  Night sweats 3. How long have you been experiencing CP? Started saturday or Sunday-----not everyday 4. Is your CP continuous or coming and going? Comes and goes 5. Have you taken Nitroglycerin? no

## 2015-12-07 NOTE — Telephone Encounter (Signed)
Pt of Dr. Stanford Breed Hx of MVR, endocarditis No CAD hx noted  Pt notes intermittent episodes of chest pain this week which he describes as "just little discomfort".  Initially noticed Saturday or Sunday. Notes is followed in coumadin clinic - anticoagulant therapy post-MVR He had coumadin check on Tuesday, number was 2.0 - he is concerned about subtherapeutic level since his range should be 2.5-3.5, He has been compliant w recommended dose alterations for correction.  Pain not severe when occurrent - 3-4 out of 10. Persists for 1-2 mins at a time.  Patient states discomfort/annoyance rather than pain. Denies SOB - states he has had some arm soreness and neck soreness - unsure if due to sleep position. States on Sun felt like "eyes were crossing". This resolved after 1-2 mins. No syncope, dizziness, lightheadedness, etc.  He also notes he has had some sweating at night, most nights for past 2 weeks. He keeps room cool so does not think related to ambient temperature. He woke up w this at 3am today. Denied occurrence of other symptoms at that time.  Pt aware if symptoms worse, to go to ER.  Discussed w Dr. Stanford Breed after review of appt calendar, availability on his schedule today - asked if OK to add pt for appt and this was recommended - o/w have pt follow up w next available PA. I called patient who stated that he could come in for 11:45a appt today. Added to schedule.

## 2015-12-19 ENCOUNTER — Ambulatory Visit (INDEPENDENT_AMBULATORY_CARE_PROVIDER_SITE_OTHER): Payer: 59 | Admitting: Pharmacist

## 2015-12-19 DIAGNOSIS — Z7901 Long term (current) use of anticoagulants: Secondary | ICD-10-CM | POA: Diagnosis not present

## 2015-12-19 DIAGNOSIS — Z954 Presence of other heart-valve replacement: Secondary | ICD-10-CM

## 2015-12-19 DIAGNOSIS — I059 Rheumatic mitral valve disease, unspecified: Secondary | ICD-10-CM | POA: Diagnosis not present

## 2015-12-19 LAB — POCT INR: INR: 3.5

## 2015-12-20 ENCOUNTER — Telehealth: Payer: Self-pay

## 2015-12-20 NOTE — Telephone Encounter (Signed)
LVM for pt to call back as soon as possible.   RE: Pt needs new PCP.    PCP has been removed until pt can schedule with a new provider.

## 2015-12-20 NOTE — Telephone Encounter (Signed)
-----   Message from Mickie Kay sent at 12/08/2015  2:56 PM EDT ----- Needs PCP  ----- Message ----- From: Hendricks Limes, MD Sent: 12/07/2015   3:58 PM To: Mickie Kay  Please verify patient's new primary care physician and route reports to that individual. Thank you very much. He's a great guy ----- Message ----- From: Lelon Perla, MD Sent: 12/07/2015  12:57 PM To: Hendricks Limes, MD

## 2016-01-09 ENCOUNTER — Ambulatory Visit (INDEPENDENT_AMBULATORY_CARE_PROVIDER_SITE_OTHER): Payer: 59 | Admitting: Pharmacist

## 2016-01-09 DIAGNOSIS — I059 Rheumatic mitral valve disease, unspecified: Secondary | ICD-10-CM

## 2016-01-09 DIAGNOSIS — Z7901 Long term (current) use of anticoagulants: Secondary | ICD-10-CM

## 2016-01-09 DIAGNOSIS — Z954 Presence of other heart-valve replacement: Secondary | ICD-10-CM

## 2016-01-09 LAB — POCT INR: INR: 3.6

## 2016-01-30 ENCOUNTER — Ambulatory Visit (INDEPENDENT_AMBULATORY_CARE_PROVIDER_SITE_OTHER): Payer: 59 | Admitting: Pharmacist Clinician (PhC)/ Clinical Pharmacy Specialist

## 2016-01-30 DIAGNOSIS — Z954 Presence of other heart-valve replacement: Secondary | ICD-10-CM | POA: Diagnosis not present

## 2016-01-30 DIAGNOSIS — I059 Rheumatic mitral valve disease, unspecified: Secondary | ICD-10-CM | POA: Diagnosis not present

## 2016-01-30 DIAGNOSIS — Z7901 Long term (current) use of anticoagulants: Secondary | ICD-10-CM | POA: Diagnosis not present

## 2016-01-30 LAB — POCT INR: INR: 3.1

## 2016-02-09 ENCOUNTER — Emergency Department (HOSPITAL_BASED_OUTPATIENT_CLINIC_OR_DEPARTMENT_OTHER)
Admission: EM | Admit: 2016-02-09 | Discharge: 2016-02-09 | Disposition: A | Discharge status: Home / Self Care | Payer: 59 | Attending: Emergency Medicine | Admitting: Emergency Medicine

## 2016-02-09 ENCOUNTER — Encounter (HOSPITAL_BASED_OUTPATIENT_CLINIC_OR_DEPARTMENT_OTHER): Payer: Self-pay | Admitting: *Deleted

## 2016-02-09 DIAGNOSIS — Z87891 Personal history of nicotine dependence: Secondary | ICD-10-CM | POA: Diagnosis not present

## 2016-02-09 DIAGNOSIS — Z7982 Long term (current) use of aspirin: Secondary | ICD-10-CM | POA: Diagnosis not present

## 2016-02-09 DIAGNOSIS — L02213 Cutaneous abscess of chest wall: Secondary | ICD-10-CM | POA: Insufficient documentation

## 2016-02-09 DIAGNOSIS — Z8546 Personal history of malignant neoplasm of prostate: Secondary | ICD-10-CM | POA: Insufficient documentation

## 2016-02-09 DIAGNOSIS — L0291 Cutaneous abscess, unspecified: Secondary | ICD-10-CM

## 2016-02-09 MED ORDER — LIDOCAINE HCL (PF) 1 % IJ SOLN
5.0000 mL | Freq: Once | INTRAMUSCULAR | Status: AC
  Administered 2016-02-09: 5 mL via INTRADERMAL
  Filled 2016-02-09: qty 5

## 2016-02-09 NOTE — ED Provider Notes (Signed)
[facility] DEPT [facility] Provider Note   CSN: [id number] Arrival date & time: 02/09/16  1321     History   Chief Complaint Chief Complaint  Patient presents with  . Abscess    HPI David [name] is a 55 y.o. male.  HPI  Patient presents with left anterior chest wall abscess over the past couple of days.  Denies drainage.  No prior treatment.  No fever, chills, N/V, surrounding erythema. Symptom onset gradual, mild-moderate, worsening.  Past Medical History:  Diagnosis Date  . DDD (degenerative disc disease), lumbosacral   . Hemolytic anemia ([place]) 06/29/2014  . History of bacterial endocarditis    07/ 2001  enterococcal//  recurrence  11/ 2009  steptococcus viridans  . History of chest tube placement    1980's --- R SIDE  SPONTANEOUS  PNEUMOTHORAX  . History of Clostridium difficile colitis    2009  . History of periodontal disease   . Prostate cancer [facility]) urologist--  dr herrick/  oncologist-- dr Tammi Klippel   Stage  T1c,  Gleason 3+4,  PSA 3.76,  vol 33.45cc  . Pulmonary nodules followed by dr Stanford Breed   right upper & lower lobe and left lower lobe--  stable per CT 08-29-2014  . S/P redo mitral valve replacement with metallic valve cardiologist--  dr Stanford Breed   08-11-2014--- 31 mm Sorin Carbomedics Optiform bileaflet mechanical prosthesis  . Thoracic radiculopathy    T7  (positional)  per PCP note--  pt doing yoga and swimming state has helpted  . Wears glasses   . Wears partial dentures    upper    Patient Active Problem List   Diagnosis Date Noted  . Strain of lumbar paraspinal muscle 08/01/2015  . Left knee pain 08/01/2015  . Long-term (current) use of anticoagulants 08/19/2014  . S/P redo mitral valve replacement with metallic valve [id number]  . Mitral valve regurgitation 08/09/2014  . Severe mitral regurgitation 08/03/2014  . Hx of mitral valve repair 08/03/2014  . Mitral regurgitation 07/19/2014  . Hemolytic anemia ([place]) 06/29/2014  . Cystitis 06/23/2014    . Malignant neoplasm of prostate ([place]) 06/23/2014  . Nodule of right lung 06/23/2014  . Symptomatic anemia 06/21/2014  . Prostate cancer ([place]) 06/07/2014  . Microcytic anemia   . Hyperlipidemia 05/31/2014  . Nonspecific abnormal electrocardiogram (ECG) (EKG) 11/19/2011  . HLD (hyperlipidemia) 01/12/2010  . GERD 01/12/2010  . HIATAL HERNIA 05/12/2009  . Mitral valve disorder 06/29/2008    Past Surgical History:  Procedure Laterality Date  . CARDIAC CATHETERIZATION  03-15-2008  dr Lia Foyer   no sig. coronary disease/  normal LVF/  moderate pulmonary hypertension w/ elevated V wave  . COLONOSCOPY  last one 12/ 2013  . ESOPHAGOGASTRODUODENOSCOPY N/A 06/02/2014   Procedure: ESOPHAGOGASTRODUODENOSCOPY (EGD);  Surgeon: Jerene Bears, MD;  Location: [facility] ENDOSCOPY;  Service: Endoscopy;  Laterality: N/A;  . LEFT AND RIGHT HEART CATHETERIZATION WITH CORONARY ANGIOGRAM N/A 08/10/2014   Procedure: LEFT AND RIGHT HEART CATHETERIZATION WITH CORONARY ANGIOGRAM;  Surgeon: Belva Crome, MD;  Location: [facility] CATH LAB;  Service: Cardiovascular;  Laterality: N/A;   EF 55%,  severe MR, severe pulm HTN,  40% LAD  . MITRAL VALVE REPAIR  03-22-2008   via  right mini thoracotomy  . MITRAL VALVE REPAIR N/A 08/11/2014   Procedure: REDO MITRAL VALVE (MV) REPLACEMENT;  Surgeon: Rexene Alberts, MD;  Location: [facility];  Service: Open Heart Surgery;  Laterality: N/A;  NO NECK LINES ON RIGHT SIDE  . MULTIPLE TOOTH EXTRACTIONS  03-16-2008    w/ alveoloplasty  . PROSTATE BIOPSY  03/2014  . RADIOACTIVE SEED IMPLANT N/A 03/17/2015   Procedure: RADIOACTIVE SEED IMPLANT/BRACHYTHERAPY IMPLANT;  Surgeon: Ardis Hughs, MD;  Location: [facility];  Service: Urology;  Laterality: N/A;  DR PORTABLE  . TEE WITHOUT CARDIOVERSION N/A 07/19/2014   Procedure: TRANSESOPHAGEAL ECHOCARDIOGRAM (TEE);  Surgeon: Lelon Perla, MD;  Location: [facility] ENDOSCOPY;  Service: Cardiovascular;  Laterality: N/A;  . TEE WITHOUT  CARDIOVERSION N/A 08/11/2014   Procedure: TRANSESOPHAGEAL ECHOCARDIOGRAM (TEE);  Surgeon: Rexene Alberts, MD;  Location: [place];  Service: Open Heart Surgery;  Laterality: N/A;  . TRANSTHORACIC ECHOCARDIOGRAM  09-23-2014   dr  Stanford Breed   mild focal basal LVH of the septum,  ef 45-50%,  normal wall motion/  mechanical bileaflet MV sits well in mitrial position, no sig. regurg., valve area 1.61 cm^2,  mean grandient 40mmHg/  mild LAE/  RA prominent Chiari malformation , unchanged from prior study, normal size/  mild TR/  mild to moderate pulmonic insuffiency       Home Medications    Prior to Admission medications   Medication Sig Start Date End Date Taking? Authorizing Provider  aspirin EC 81 MG EC tablet Take 1 tablet (81 mg total) by mouth daily. 08/17/14   Erin R Barrett, PA-C  Diclofenac Sodium (PENNSAID) 2 % SOLN Place 1 application onto the skin 2 (two) times daily as needed. 08/01/15   Golden Circle, FNP  enoxaparin (LOVENOX) 80 MG/0.8ML injection INJECT 0.8 MLS (80 MG TOTAL) INTO THE SKIN EVERY 12 (TWELVE) HOURS. 02/20/15   Historical Provider, MD  loratadine (CLARITIN) 10 MG tablet Take 1 tablet (10 mg total) by mouth daily. Patient taking differently: Take 10 mg by mouth every morning.  02/28/15   Hendricks Limes, MD  metoprolol succinate (TOPROL-XL) 25 MG 24 hr tablet TAKE 1 TABLET (25 MG TOTAL) BY MOUTH DAILY. 09/04/15   Lelon Perla, MD  tadalafil (CIALIS) 20 MG tablet Take 0.5-1 tablets (10-20 mg total) by mouth daily as needed for erectile dysfunction. 02/28/15   Hendricks Limes, MD  tamsulosin (FLOMAX) 0.4 MG CAPS capsule Take 1 capsule (0.4 mg total) by mouth daily. 03/17/15   Ardis Hughs, MD  warfarin (COUMADIN) 5 MG tablet TAKE 1-1.5 TABLETS BY MOUTH DAILY AS DIRECTED BY COUMADIN CLINIC 09/04/15   Lelon Perla, MD    Family History Family History  Problem Relation Age of Onset  . Ovarian cancer Sister   . Alzheimer's disease Father   . Diabetes Neg Hx   .  Heart disease Neg Hx   . Stroke Neg Hx     Social History Social History  Substance Use Topics  . Smoking status: Former Smoker    Packs/day: 2.00    Years: 15.00    Types: Cigarettes    Quit date: 08/08/1989  . Smokeless tobacco: Never Used  . Alcohol use No     Comment: "stopped drinking in 1991     Allergies   Clarithromycin   Review of Systems Review of Systems All other systems negative unless otherwise stated in HPI   Physical Exam Updated Vital Signs BP 134/84   Pulse 87   Temp 97.8 F (36.6 C)   Resp 16   Ht 5\' 10"  (1.778 m)   Wt 75.8 kg   SpO2 100%   BMI 23.96 kg/m   Physical Exam  Constitutional: He is oriented to person, place, and time. He appears  well-developed and well-nourished.  HENT:  Head: Normocephalic and atraumatic.  Right Ear: External ear normal.  Left Ear: External ear normal.  Eyes: Conjunctivae are normal. No scleral icterus.  Neck: No tracheal deviation present.  Pulmonary/Chest: Effort normal. No respiratory distress.  Abdominal: He exhibits no distension.  Musculoskeletal: Normal range of motion.  Neurological: He is alert and oriented to person, place, and time.  Skin: Skin is warm and dry.  1x1 cm area of fluctuance with NO surrounding induration.  Very mild erythema.  Psychiatric: He has a normal mood and affect. His behavior is normal.     ED Treatments / Results  Labs (all labs ordered are listed, but only abnormal results are displayed) Labs Reviewed - No data to display  EKG  EKG Interpretation None       Radiology No results found.  Procedures [place] bedside Date/Time: 02/09/2016 3:33 PM Performed by: Gloriann Loan Authorized by: Gloriann Loan  Consent: Verbal consent obtained. Consent given by: patient Comments: Small superficial spherical anechoic collection consistent with abscess.    (including critical care time)   INCISION AND DRAINAGE Performed by: Gloriann Loan Consent: Verbal consent  obtained. Risks and benefits: risks, benefits and alternatives were discussed Type: abscess  Body area: left anterior chest  Anesthesia: local infiltration  Incision was made with a scalpel.  Local anesthetic: lidocaine 1% with epinephrine  Anesthetic total: 3 ml  Complexity: complex Blunt dissection to break up loculations  Drainage: purulent  Drainage amount: moderate  Packing material: none  Patient tolerance: Patient tolerated the procedure well with no immediate complications.     Medications Ordered in ED Medications  lidocaine (PF) (XYLOCAINE) 1 % injection 5 mL (5 mLs Intradermal Given by Other 02/09/16 1440)     Initial Impression / Assessment and Plan / ED Course  I have reviewed the triage vital signs and the nursing notes.  Pertinent labs & imaging results that were available during my care of the patient were reviewed by me and considered in my medical decision making (see chart for details).  Clinical Course   Patient with skin abscess. Incision and drainage performed in the ED today.  Abscess was not large enough to warrant packing or drain placement.  No surrounding cellulitis, no indication for antibiotics at this time.  Wound recheck in 2 days. Supportive care and return precautions discussed.  The patient appears reasonably screened and/or stabilized for discharge and I doubt any other emergent medical condition requiring further screening, evaluation, or treatment in the ED prior to discharge.    Final Clinical Impressions(s) / ED Diagnoses   Final diagnoses:  Abscess    New Prescriptions New Prescriptions   No medications on file     Vivien Rossetti 02/09/16 1536    Tanna Furry, MD 02/25/16 2018

## 2016-02-09 NOTE — ED Notes (Signed)
PA at bedside.

## 2016-02-09 NOTE — ED Triage Notes (Signed)
C/o abscess to right axillary x 1 day

## 2016-02-28 ENCOUNTER — Ambulatory Visit (INDEPENDENT_AMBULATORY_CARE_PROVIDER_SITE_OTHER): Payer: 59 | Admitting: Pharmacist Clinician (PhC)/ Clinical Pharmacy Specialist

## 2016-02-28 DIAGNOSIS — Z7901 Long term (current) use of anticoagulants: Secondary | ICD-10-CM | POA: Diagnosis not present

## 2016-02-28 DIAGNOSIS — I059 Rheumatic mitral valve disease, unspecified: Secondary | ICD-10-CM | POA: Diagnosis not present

## 2016-02-28 DIAGNOSIS — Z954 Presence of other heart-valve replacement: Secondary | ICD-10-CM | POA: Diagnosis not present

## 2016-02-28 LAB — POCT INR: INR: 2.1

## 2016-03-08 ENCOUNTER — Ambulatory Visit (INDEPENDENT_AMBULATORY_CARE_PROVIDER_SITE_OTHER): Payer: 59 | Admitting: Family

## 2016-03-08 ENCOUNTER — Encounter: Payer: Self-pay | Admitting: Cardiology

## 2016-03-08 ENCOUNTER — Encounter: Payer: Self-pay | Admitting: Family

## 2016-03-08 ENCOUNTER — Encounter: Payer: Self-pay | Admitting: *Deleted

## 2016-03-08 DIAGNOSIS — Z0000 Encounter for general adult medical examination without abnormal findings: Secondary | ICD-10-CM | POA: Diagnosis not present

## 2016-03-08 NOTE — Telephone Encounter (Signed)
This encounter was created in error - please disregard.

## 2016-03-08 NOTE — Progress Notes (Signed)
Subjective:    Patient ID: David [name], male    DOB: [birth year], 55 y.o.   MRN: [id number]  Chief Complaint  Patient presents with  . CPE    not fasting    HPI:  David [name] is a 55 y.o. male who presents today for an annual wellness visit.   1) Health Maintenance -   Diet - Averaging about 3 meals per day consisting of a regular diet; Caffeine intake of about 4-5 cups per day  Exercise - Does a lot of walking; Occasionally at the gym.   2) Preventative Exams / Immunizations:  Dental -- Up to date  Vision -- Up todate   Health Maintenance  Topic Date Due  . URINE MICROALBUMIN  11/24/1969  . TETANUS/TDAP  01/13/2020  . COLONOSCOPY  04/13/2022  . INFLUENZA VACCINE  Completed  . Hepatitis C Screening  Completed  . HIV Screening  Completed    Immunization History  Administered Date(s) Administered  . Influenza Whole 04/10/2007, 01/31/2012  . Influenza-Unspecified 02/28/2015  . Pneumococcal Polysaccharide-23 07/30/2004  . Td 01/12/2010     Allergies  Allergen Reactions  . Clarithromycin Nausea Only     Outpatient Medications Prior to Visit  Medication Sig Dispense Refill  . aspirin EC 81 MG EC tablet Take 1 tablet (81 mg total) by mouth daily.    [name] loratadine (CLARITIN) 10 MG tablet Take 1 tablet (10 mg total) by mouth daily. (Patient taking differently: Take 10 mg by mouth every morning. ) 90 tablet 3  . metoprolol succinate (TOPROL-XL) 25 MG 24 hr tablet TAKE 1 TABLET (25 MG TOTAL) BY MOUTH DAILY. 90 tablet 2  . tadalafil (CIALIS) 20 MG tablet Take 0.5-1 tablets (10-20 mg total) by mouth daily as needed for erectile dysfunction. 10 tablet 5  . tamsulosin (FLOMAX) 0.4 MG CAPS capsule Take 1 capsule (0.4 mg total) by mouth daily. 30 capsule 2  . warfarin (COUMADIN) 5 MG tablet TAKE 1-1.5 TABLETS BY MOUTH DAILY AS DIRECTED BY COUMADIN CLINIC 120 tablet 1  . Diclofenac Sodium (PENNSAID) 2 % SOLN Place 1 application onto the skin 2 (two) times daily as  needed. 112 g 1  . enoxaparin (LOVENOX) 80 MG/0.8ML injection INJECT 0.8 MLS (80 MG TOTAL) INTO THE SKIN EVERY 12 (TWELVE) HOURS.  0   No facility-administered medications prior to visit.      Past Medical History:  Diagnosis Date  . DDD (degenerative disc disease), lumbosacral   . Hemolytic anemia ([place]) 06/29/2014  . History of bacterial endocarditis    07/ 2001  enterococcal//  recurrence  11/ 2009  steptococcus viridans  . History of chest tube placement    1980's --- R SIDE  SPONTANEOUS  PNEUMOTHORAX  . History of Clostridium difficile colitis    2009  . History of periodontal disease   . Prostate cancer [facility]) urologist--  dr herrick/  oncologist-- dr Tammi Klippel   Stage  T1c,  Gleason 3+4,  PSA 3.76,  vol 33.45cc  . Pulmonary nodules followed by dr Stanford Breed   right upper & lower lobe and left lower lobe--  stable per CT 08-29-2014  . S/P redo mitral valve replacement with metallic valve cardiologist--  dr Stanford Breed   08-11-2014--- 31 mm Sorin Carbomedics Optiform bileaflet mechanical prosthesis  . Thoracic radiculopathy    T7  (positional)  per PCP note--  pt doing yoga and swimming state has helpted  . Wears glasses   . Wears partial dentures    upper  Past Surgical History:  Procedure Laterality Date  . CARDIAC CATHETERIZATION  03-15-2008  dr Lia Foyer   no sig. coronary disease/  normal LVF/  moderate pulmonary hypertension w/ elevated V wave  . COLONOSCOPY  last one 12/ 2013  . ESOPHAGOGASTRODUODENOSCOPY N/A 06/02/2014   Procedure: ESOPHAGOGASTRODUODENOSCOPY (EGD);  Surgeon: Jerene Bears, MD;  Location: [facility] ENDOSCOPY;  Service: Endoscopy;  Laterality: N/A;  . LEFT AND RIGHT HEART CATHETERIZATION WITH CORONARY ANGIOGRAM N/A 08/10/2014   Procedure: LEFT AND RIGHT HEART CATHETERIZATION WITH CORONARY ANGIOGRAM;  Surgeon: Belva Crome, MD;  Location[facility] C[facility] LAB;  Service: Cardiovascular;  Laterality: N/A;   EF 55%,  severe MR, severe pulm HTN,  40% LAD  . MITRAL VALVE REPAIR   03-22-2008   via  right mini thoracotomy  . MITRAL VALVE REPAIR N/A 08/11/2014   Procedure: REDO MITRAL VALVE (MV) REPLACEMENT;  Surgeon: Rexene Alberts, MD;  Location: [place];  Service: Open Heart Surgery;  Laterality: N/A;  NO NECK LINES ON RIGHT SIDE  . MULTIPLE TOOTH EXTRACTIONS  03-16-2008    w/ alveoloplasty  . PROSTATE BIOPSY  03/2014  . RADIOACTIVE SEED IMPLANT N/A 03/17/2015   Procedure: RADIOACTIVE SEED IMPLANT/BRACHYTHERAPY IMPLANT;  Surgeon: Ardis Hughs, MD;  Location: [facility];  Service: Urology;  Laterality: N/A;  DR PORTABLE  . TEE WITHOUT CARDIOVERSION N/A 07/19/2014   Procedure: TRANSESOPHAGEAL ECHOCARDIOGRAM (TEE);  Surgeon: Lelon Perla, MD;  Location: [facility] ENDOSCOPY;  Service: Cardiovascular;  Laterality: N/A;  . TEE WITHOUT CARDIOVERSION N/A 08/11/2014   Procedure: TRANSESOPHAGEAL ECHOCARDIOGRAM (TEE);  Surgeon: Rexene Alberts, MD;  Location: [place];  Service: Open Heart Surgery;  Laterality: N/A;  . TRANSTHORACIC ECHOCARDIOGRAM  09-23-2014   dr  Stanford Breed   mild focal basal LVH of the septum,  ef 45-50%,  normal wall motion/  mechanical bileaflet MV sits well in mitrial position, no sig. regurg., valve area 1.61 cm^2,  mean grandient 5mmHg/  mild LAE/  RA prominent Chiari malformation , unchanged from prior study, normal size/  mild TR/  mild to moderate pulmonic insuffiency     Family History  Problem Relation Age of Onset  . Ovarian cancer Sister   . Alzheimer's disease Father   . Diabetes Neg Hx   . Heart disease Neg Hx   . Stroke Neg Hx      Social History   Social History  . Marital status: Married    Spouse name: N/A  . Number of children: 3  . Years of education: 12   Occupational History  . REL[employer]ABILITY [employer] TE[employer]    for [employer] History Main Topics  . Smoking status: Former Smoker    Packs/day: 2.00    Years: 15.00    Types: Cigarettes    Quit date: 08/08/1989  . Smokeless  tobacco: Never Used  . Alcohol use No     Comment: "stopped drinking in 1991  . Drug use: No     Comment: "stopped using drugs in ~ 1984"  . Sexual activity: Not on file   Other Topics Concern  . Not on file   Social History Narrative   REGULAR EXERCISE 2X [employer]READ[employer],WEIGHTS      Review of Systems  Constitutional: Denies fever, chills, fatigue, or significant weight gain/loss. HENT: Head: Denies headache or neck pain Ears: Denies changes in hearing, ringing in ears, earache, drainage Nose: Denies discharge, stuffiness, itching, nosebleed, sinus pain Throat: Denies sore throat, hoarseness,  dry mouth, sores, thrush Eyes: Denies loss/changes in vision, pain, redness, blurry/double vision, flashing lights Cardiovascular: Denies chest pain/discomfort, tightness, palpitations, shortness of breath with activity, difficulty lying down, swelling, sudden awakening with shortness of breath Respiratory: Denies shortness of breath, cough, sputum production, wheezing Gastrointestinal: Denies dysphasia, heartburn, change in appetite, nausea, change in bowel habits, rectal bleeding, constipation, diarrhea, yellow skin or eyes Genitourinary: Denies frequency, urgency, burning/pain, blood in urine, incontinence, change in urinary strength. Musculoskeletal: Denies muscle/joint pain, stiffness, back pain, redness or swelling of joints, trauma Skin: Denies rashes, lumps, itching, dryness, color changes, or hair/nail changes Neurological: Denies dizziness, fainting, seizures, weakness, numbness, tingling, tremor Psychiatric - Denies nervousness, stress, depression or memory loss Endocrine: Denies heat or cold intolerance, sweating, frequent urination, excessive thirst, changes in appetite Hematologic: Denies ease of bruising or bleeding     Objective:     BP 132/82 (BP Location: Left Arm, Patient Position: Sitting, Cuff Size: Normal)   Pulse 71   Temp 98.3 F (36.8 C) (Oral)   Resp 16   Ht 5'  10" (1.778 m)   Wt 170 lb (77.1 kg)   SpO2 98%   BMI 24.39 kg/m  Nursing note and vital signs reviewed.  Physical Exam  Constitutional: He is oriented to person, place, and time. He appears well-developed and well-nourished.  HENT:  Head: Normocephalic.  Right Ear: Hearing, tympanic membrane, external ear and ear canal normal.  Left Ear: Hearing, tympanic membrane, external ear and ear canal normal.  Nose: Nose normal.  Mouth/Throat: Uvula is midline, oropharynx is clear and moist and mucous membranes are normal.  Eyes: Conjunctivae and EOM are normal. Pupils are equal, round, and reactive to light.  Neck: Neck supple. No JVD present. No tracheal deviation present. No thyromegaly present.  Cardiovascular: Normal rate, regular rhythm, normal heart sounds and intact distal pulses.   Pulmonary/Chest: Effort normal and breath sounds normal.  Abdominal: Soft. Bowel sounds are normal. He exhibits no distension and no mass. There is no tenderness. There is no rebound and no guarding.  Musculoskeletal: Normal range of motion. He exhibits no edema or tenderness.  Lymphadenopathy:    He has no cervical adenopathy.  Neurological: He is alert and oriented to person, place, and time. He has normal reflexes. No cranial nerve deficit. He exhibits normal muscle tone. Coordination normal.  Skin: Skin is warm and dry.  Psychiatric: He has a normal mood and affect. His behavior is normal. Judgment and thought content normal.       Assessment & Plan:   Problem List Items Addressed This Visit      Other   Routine general medical examination at a health care facility    1) Anticipatory Guidance: Discussed importance of wearing a seatbelt while driving and not texting while driving; changing batteries in smoke detector at least once annually; wearing suntan lotion when outside; eating a balanced and moderate diet; getting physical activity at least 30 minutes per day.  2) Immunizations / Screenings /  Labs:  All immunizations are up-to-date per recommendations. Obtain A1c for diabetes screening. Prostate cancer screening and PSA completed by urology. All other screenings are up-to-date per recommendations. Obtain CBC, CMET, and lipid profile.   Overall well exam with risk factors for cardiovascular disease include hyperlipidemia and previous history of mitral valve replacement. Anticoagulated with warfarin and maintain by cardiology. Prostate cancer currently managed by urology and appears stable. Encouraged to increase physical activity as able. Appears to have a good nutritional intake. Continue healthy  lifestyle behaviors and choices. Follow-up prevention exam in 1 year. Follow-up office visit pending blood work        Relevant Orders   CBC   Comprehensive metabolic panel   Lipid panel   Hemoglobin A1c       I have discontinued Mr. Aeschliman's enoxaparin and Diclofenac Sodium. I am also having him maintain his aspirin, loratadine, tadalafil, tamsulosin, metoprolol succinate, and warfarin.   Follow-up: Return in about 6 months (around 09/05/2016), or if symptoms worsen or fail to improve.   Mauricio Po, FNP

## 2016-03-08 NOTE — Telephone Encounter (Signed)
Left message for pt to call to discuss scheduling f/u CT wo to follow up on lung nodule.

## 2016-03-08 NOTE — Patient Instructions (Signed)
Thank you for choosing [facility].  SUMMARY AND INSTRUCTIONS:  Medication:  Continue to take your medications as prescribed.   Labs:  Please stop by the lab on the lower level of the building for your blood work. Your results will be released to [facility] (or called to you) after review, usually within 72 hours after test completion. If any changes need to be made, you will be notified at that same time.  1.) The lab is open from 7:30am to 5:30 pm Monday-Friday 2.) No appointment is necessary 3.) Fasting (if needed) is 6-8 hours after food and drink; black coffee and water are okay   Follow up:  If your symptoms worsen or fail to improve, please contact our office for further instruction, or in case of emergency go directly to the emergency room at the closest medical facility.    Health Maintenance, Male A healthy lifestyle and preventative care can promote health and wellness.  Maintain regular health, dental, and eye exams.  Eat a healthy diet. Foods like vegetables, fruits, whole grains, low-fat dairy products, and lean protein foods contain the nutrients you need and are low in calories. Decrease your intake of foods high in solid fats, added sugars, and salt. Get information about a proper diet from your health care provider, if necessary.  Regular physical exercise is one of the most important things you can do for your health. Most adults should get at least 150 minutes of moderate-intensity exercise (any activity that increases your heart rate and causes you to sweat) each week. In addition, most adults need muscle-strengthening exercises on 2 or more days a week.   Maintain a healthy weight. The body mass index (BMI) is a screening tool to identify possible weight problems. It provides an estimate of body fat based on height and weight. Your health care provider can find your BMI and can help you achieve or maintain a healthy weight. For males 20 years and older:  A  BMI below 18.5 is considered underweight.  A BMI of 18.5 to 24.9 is normal.  A BMI of 25 to 29.9 is considered overweight.  A BMI of 30 and above is considered obese.  Maintain normal blood lipids and cholesterol by exercising and minimizing your intake of saturated fat. Eat a balanced diet with plenty of fruits and vegetables. Blood tests for lipids and cholesterol should begin at age 71 and be repeated every 5 years. If your lipid or cholesterol levels are high, you are over age 39, or you are at high risk for heart disease, you may need your cholesterol levels checked more frequently.Ongoing high lipid and cholesterol levels should be treated with medicines if diet and exercise are not working.  If you smoke, find out from your health care provider how to quit. If you do not use tobacco, do not start.  Lung cancer screening is recommended for adults aged 6-80 years who are at high risk for developing lung cancer because of a history of smoking. A yearly low-dose CT scan of the lungs is recommended for people who have at least a 30-pack-year history of smoking and are current smokers or have quit within the past 15 years. A pack year of smoking is smoking an average of 1 pack of cigarettes a day for 1 year (for example, a 30-pack-year history of smoking could mean smoking 1 pack a day for 30 years or 2 packs a day for 15 years). Yearly screening should continue until the smoker has stopped  smoking for at least 15 years. Yearly screening should be stopped for people who develop a health problem that would prevent them from having lung cancer treatment.  If you choose to drink alcohol, do not have more than 2 drinks per day. One drink is considered to be 12 oz (360 mL) of beer, 5 oz (150 mL) of wine, or 1.5 oz (45 mL) of liquor.  Avoid the use of street drugs. Do not share needles with anyone. Ask for help if you need support or instructions about stopping the use of drugs.  High blood pressure  causes heart disease and increases the risk of stroke. High blood pressure is more likely to develop in:  People who have blood pressure in the end of the normal range (100-139/85-89 mm Hg).  People who are overweight or obese.  People who are African American.  If you are 46-44 years of age, have your blood pressure checked every 3-5 years. If you are 29 years of age or older, have your blood pressure checked every year. You should have your blood pressure measured twice--once when you are at a hospital or clinic, and once when you are not at a hospital or clinic. Record the average of the two measurements. To check your blood pressure when you are not at a hospital or clinic, you can use:  An automated blood pressure machine at a pharmacy.  A home blood pressure monitor.  If you are 21-49 years old, ask your health care provider if you should take aspirin to prevent heart disease.  Diabetes screening involves taking a blood sample to check your fasting blood sugar level. This should be done once every 3 years after age 46 if you are at a normal weight and without risk factors for diabetes. Testing should be considered at a younger age or be carried out more frequently if you are overweight and have at least 1 risk factor for diabetes.  Colorectal cancer can be detected and often prevented. Most routine colorectal cancer screening begins at the age of 20 and continues through age 75. However, your health care provider may recommend screening at an earlier age if you have risk factors for colon cancer. On a yearly basis, your health care provider may provide home test kits to check for hidden blood in the stool. A small camera at the end of a tube may be used to directly examine the colon (sigmoidoscopy or colonoscopy) to detect the earliest forms of colorectal cancer. Talk to your health care provider about this at age 50 when routine screening begins. A direct exam of the colon should be repeated  every 5-10 years through age 89, unless early forms of precancerous polyps or small growths are found.  People who are at an increased risk for hepatitis B should be screened for this virus. You are considered at high risk for hepatitis B if:  You were born in a country where hepatitis B occurs often. Talk with your health care provider about which countries are considered high risk.  Your parents were born in a high-risk country and you have not received a shot to protect against hepatitis B (hepatitis B vaccine).  You have HIV or AIDS.  You use needles to inject street drugs.  You live with, or have sex with, someone who has hepatitis B.  You are a man who has sex with other men (MSM).  You get hemodialysis treatment.  You take certain medicines for conditions like cancer, organ transplantation,  and autoimmune conditions.  Hepatitis C blood testing is recommended for all people born from [birth year] through 1966 and any individual with known risk factors for hepatitis C.  Healthy men should no longer receive prostate-specific antigen (PSA) blood tests as part of routine cancer screening. Talk to your health care provider about prostate cancer screening.  Testicular cancer screening is not recommended for adolescents or adult males who have no symptoms. Screening includes self-exam, a health care provider exam, and other screening tests. Consult with your health care provider about any symptoms you have or any concerns you have about testicular cancer.  Practice safe sex. Use condoms and avoid high-risk sexual practices to reduce the spread of sexually transmitted infections (STIs).  You should be screened for STIs, including gonorrhea and chlamydia if:  You are sexually active and are younger than 24 years.  You are older than 24 years, and your health care provider tells you that you are at risk for this type of infection.  Your sexual activity has changed since you were last screened,  and you are at an increased risk for chlamydia or gonorrhea. Ask your health care provider if you are at risk.  If you are at risk of being infected with HIV, it is recommended that you take a prescription medicine daily to prevent HIV infection. This is called pre-exposure prophylaxis (PrEP). You are considered at risk if:  You are a man who has sex with other men (MSM).  You are a heterosexual man who is sexually active with multiple partners.  You take drugs by injection.  You are sexually active with a partner who has HIV.  Talk with your health care provider about whether you are at high risk of being infected with HIV. If you choose to begin PrEP, you should first be tested for HIV. You should then be tested every 3 months for as long as you are taking PrEP.  Use sunscreen. Apply sunscreen liberally and repeatedly throughout the day. You should seek shade when your shadow is shorter than you. Protect yourself by wearing long sleeves, pants, a wide-brimmed hat, and sunglasses year round whenever you are outdoors.  Tell your health care provider of new moles or changes in moles, especially if there is a change in shape or color. Also, tell your health care provider if a mole is larger than the size of a pencil eraser.  A one-time screening for abdominal aortic aneurysm (AAA) and surgical repair of large AAAs by ultrasound is recommended for men aged 82-75 years who are current or former smokers.  Stay current with your vaccines (immunizations).   This information is not intended to replace advice given to you by your health care provider. Make sure you discuss any questions you have with your health care provider.   Document Released: 10/12/2007 Document Revised: 05/06/2014 Document Reviewed: 09/10/2010 Elsevier Interactive Patient Education [employer].

## 2016-03-08 NOTE — Telephone Encounter (Signed)
New message  Pt is returning call to [facility]

## 2016-03-08 NOTE — Assessment & Plan Note (Signed)
1) Anticipatory Guidance: Discussed importance of wearing a seatbelt while driving and not texting while driving; changing batteries in smoke detector at least once annually; wearing suntan lotion when outside; eating a balanced and moderate diet; getting physical activity at least 30 minutes per day.  2) Immunizations / Screenings / Labs:  All immunizations are up-to-date per recommendations. Obtain A1c for diabetes screening. Prostate cancer screening and PSA completed by urology. All other screenings are up-to-date per recommendations. Obtain CBC, CMET, and lipid profile.   Overall well exam with risk factors for cardiovascular disease include hyperlipidemia and previous history of mitral valve replacement. Anticoagulated with warfarin and maintain by cardiology. Prostate cancer currently managed by urology and appears stable. Encouraged to increase physical activity as able. Appears to have a good nutritional intake. Continue healthy lifestyle behaviors and choices. Follow-up prevention exam in 1 year. Follow-up office visit pending blood work

## 2016-03-10 ENCOUNTER — Other Ambulatory Visit: Payer: Self-pay | Admitting: Internal Medicine

## 2016-03-11 ENCOUNTER — Encounter: Payer: Self-pay | Admitting: Family

## 2016-03-11 ENCOUNTER — Other Ambulatory Visit (INDEPENDENT_AMBULATORY_CARE_PROVIDER_SITE_OTHER): Payer: 59

## 2016-03-11 DIAGNOSIS — Z0000 Encounter for general adult medical examination without abnormal findings: Secondary | ICD-10-CM

## 2016-03-11 LAB — CBC
HCT: 45 % (ref 39.0–52.0)
Hemoglobin: 14.7 g/dL (ref 13.0–17.0)
MCHC: 32.7 g/dL (ref 30.0–36.0)
MCV: 90.7 fl (ref 78.0–100.0)
Platelets: 211 10*3/uL (ref 150.0–400.0)
RBC: 4.96 Mil/uL (ref 4.22–5.81)
RDW: 14.9 % (ref 11.5–15.5)
WBC: 2.7 10*3/uL — ABNORMAL LOW (ref 4.0–10.5)

## 2016-03-11 LAB — LIPID PANEL
Cholesterol: 244 mg/dL — ABNORMAL HIGH (ref 0–200)
HDL: 62.1 mg/dL (ref >39.00)
LDL Cholesterol: 166 mg/dL — ABNORMAL HIGH (ref 0–99)
NonHDL: 181.61
Total CHOL/HDL Ratio: 4
Triglycerides: 78 mg/dL (ref 0.0–149.0)
VLDL: 15.6 mg/dL (ref 0.0–40.0)

## 2016-03-11 LAB — COMPREHENSIVE METABOLIC PANEL
ALT: 25 U/L (ref 0–53)
AST: 22 U/L (ref 0–37)
Albumin: 4.3 g/dL (ref 3.5–5.2)
Alkaline Phosphatase: 63 U/L (ref 39–117)
BUN: 12 mg/dL (ref 6–23)
CO2: 30 mEq/L (ref 19–32)
Calcium: 9.9 mg/dL (ref 8.4–10.5)
Chloride: 104 mEq/L (ref 96–112)
Creatinine, Ser: 1.08 mg/dL (ref 0.40–1.50)
GFR: 90.86 mL/min (ref >60.00)
Glucose, Bld: 86 mg/dL (ref 70–99)
Potassium: 4.2 mEq/L (ref 3.5–5.1)
Sodium: 140 mEq/L (ref 135–145)
Total Bilirubin: 0.4 mg/dL (ref 0.2–1.2)
Total Protein: 7.2 g/dL (ref 6.0–8.3)

## 2016-03-11 LAB — HEMOGLOBIN A1C: Hgb A1c MFr Bld: 5.6 % (ref 4.6–6.5)

## 2016-03-18 ENCOUNTER — Ambulatory Visit (INDEPENDENT_AMBULATORY_CARE_PROVIDER_SITE_OTHER): Payer: 59 | Admitting: Pharmacist Clinician (PhC)/ Clinical Pharmacy Specialist

## 2016-03-18 DIAGNOSIS — Z954 Presence of other heart-valve replacement: Secondary | ICD-10-CM

## 2016-03-18 DIAGNOSIS — Z7901 Long term (current) use of anticoagulants: Secondary | ICD-10-CM | POA: Diagnosis not present

## 2016-03-18 DIAGNOSIS — I059 Rheumatic mitral valve disease, unspecified: Secondary | ICD-10-CM

## 2016-03-18 LAB — POCT INR: INR: 4.1

## 2016-04-08 ENCOUNTER — Ambulatory Visit (INDEPENDENT_AMBULATORY_CARE_PROVIDER_SITE_OTHER): Payer: 59 | Admitting: Pharmacist Clinician (PhC)/ Clinical Pharmacy Specialist

## 2016-04-08 DIAGNOSIS — Z954 Presence of other heart-valve replacement: Secondary | ICD-10-CM | POA: Diagnosis not present

## 2016-04-08 DIAGNOSIS — I059 Rheumatic mitral valve disease, unspecified: Secondary | ICD-10-CM | POA: Diagnosis not present

## 2016-04-08 DIAGNOSIS — Z7901 Long term (current) use of anticoagulants: Secondary | ICD-10-CM | POA: Diagnosis not present

## 2016-04-08 LAB — POCT INR: INR: 3.2

## 2016-05-06 ENCOUNTER — Ambulatory Visit (INDEPENDENT_AMBULATORY_CARE_PROVIDER_SITE_OTHER): Payer: 59 | Admitting: Pharmacist Clinician (PhC)/ Clinical Pharmacy Specialist

## 2016-05-06 DIAGNOSIS — I059 Rheumatic mitral valve disease, unspecified: Secondary | ICD-10-CM

## 2016-05-06 DIAGNOSIS — Z954 Presence of other heart-valve replacement: Secondary | ICD-10-CM | POA: Diagnosis not present

## 2016-05-06 DIAGNOSIS — Z7901 Long term (current) use of anticoagulants: Secondary | ICD-10-CM

## 2016-05-06 LAB — POCT INR: INR: 3.2

## 2016-05-21 ENCOUNTER — Other Ambulatory Visit: Payer: Self-pay | Admitting: Cardiology

## 2016-05-21 NOTE — Telephone Encounter (Signed)
REFILL 

## 2016-06-03 ENCOUNTER — Ambulatory Visit (INDEPENDENT_AMBULATORY_CARE_PROVIDER_SITE_OTHER): Payer: 59 | Admitting: Pharmacist

## 2016-06-03 DIAGNOSIS — I059 Rheumatic mitral valve disease, unspecified: Secondary | ICD-10-CM | POA: Diagnosis not present

## 2016-06-03 DIAGNOSIS — Z7901 Long term (current) use of anticoagulants: Secondary | ICD-10-CM

## 2016-06-03 DIAGNOSIS — Z954 Presence of other heart-valve replacement: Secondary | ICD-10-CM

## 2016-06-03 LAB — POCT INR: INR: 3.4

## 2016-06-20 ENCOUNTER — Other Ambulatory Visit: Payer: Self-pay | Admitting: Cardiology

## 2016-08-07 ENCOUNTER — Ambulatory Visit (INDEPENDENT_AMBULATORY_CARE_PROVIDER_SITE_OTHER): Payer: 59 | Admitting: Pharmacist Clinician (PhC)/ Clinical Pharmacy Specialist

## 2016-08-07 DIAGNOSIS — Z7901 Long term (current) use of anticoagulants: Secondary | ICD-10-CM

## 2016-08-07 DIAGNOSIS — I059 Rheumatic mitral valve disease, unspecified: Secondary | ICD-10-CM

## 2016-08-07 DIAGNOSIS — Z954 Presence of other heart-valve replacement: Secondary | ICD-10-CM | POA: Diagnosis not present

## 2016-08-07 LAB — POCT INR: INR: 4.4

## 2016-09-20 ENCOUNTER — Ambulatory Visit (INDEPENDENT_AMBULATORY_CARE_PROVIDER_SITE_OTHER): Payer: 59 | Admitting: Nurse Practitioner

## 2016-09-20 ENCOUNTER — Other Ambulatory Visit (INDEPENDENT_AMBULATORY_CARE_PROVIDER_SITE_OTHER): Payer: 59

## 2016-09-20 ENCOUNTER — Encounter: Payer: Self-pay | Admitting: Nurse Practitioner

## 2016-09-20 VITALS — BP 138/90 | HR 81 | Temp 98.5°F | Ht 70.0 in | Wt 164.0 lb

## 2016-09-20 DIAGNOSIS — R030 Elevated blood-pressure reading, without diagnosis of hypertension: Secondary | ICD-10-CM | POA: Diagnosis not present

## 2016-09-20 DIAGNOSIS — R51 Headache: Secondary | ICD-10-CM

## 2016-09-20 DIAGNOSIS — Z7901 Long term (current) use of anticoagulants: Secondary | ICD-10-CM

## 2016-09-20 DIAGNOSIS — R519 Headache, unspecified: Secondary | ICD-10-CM

## 2016-09-20 LAB — BASIC METABOLIC PANEL
BUN: 12 mg/dL (ref 6–23)
CO2: 30 mEq/L (ref 19–32)
Calcium: 9.8 mg/dL (ref 8.4–10.5)
Chloride: 104 mEq/L (ref 96–112)
Creatinine, Ser: 1.08 mg/dL (ref 0.40–1.50)
GFR: 90.69 mL/min (ref >60.00)
Glucose, Bld: 82 mg/dL (ref 70–99)
Potassium: 4.3 mEq/L (ref 3.5–5.1)
Sodium: 139 mEq/L (ref 135–145)

## 2016-09-20 LAB — PROTIME-INR
INR: 3.6 ratio — ABNORMAL HIGH (ref 0.8–1.0)
Prothrombin Time: 38.4 s — ABNORMAL HIGH (ref 9.6–13.1)

## 2016-09-20 NOTE — Patient Instructions (Addendum)
Please go to basement for blood draw.  Continue to check BP at home (once a day in morning).  Call office if BP >150/90 for more than 2days.  Due to today's INR of 3.6, hold coumadin dose today. Take 5mg  of coumadin once a day on Saturday and Sunday. Call cardiology office on Monday for additional instructions on coumadin.  General Headache Without Cause A headache is pain or discomfort felt around the head or neck area. There are many causes and types of headaches. In some cases, the cause may not be found. Follow these instructions at home: Managing pain   Take over-the-counter and prescription medicines only as told by your doctor.  Lie down in a dark, quiet room when you have a headache.  If directed, apply ice to the head and neck area:  Put ice in a plastic bag.  Place a towel between your skin and the bag.  Leave the ice on for 20 minutes, 2-3 times per day.  Use a heating pad or hot shower to apply heat to the head and neck area as told by your doctor.  Keep lights dim if bright lights bother you or make your headaches worse. Eating and drinking   Eat meals on a regular schedule.  Lessen how much alcohol you drink.  Lessen how much caffeine you drink, or stop drinking caffeine. General instructions   Keep all follow-up visits as told by your doctor. This is important.  Keep a journal to find out if certain things bring on headaches. For example, write down:  What you eat and drink.  How much sleep you get.  Any change to your diet or medicines.  Relax by getting a massage or doing other relaxing activities.  Lessen stress.  Sit up straight. Do not tighten (tense) your muscles.  Do not use tobacco products. This includes cigarettes, chewing tobacco, or e-cigarettes. If you need help quitting, ask your doctor.  Exercise regularly as told by your doctor.  Get enough sleep. This often means 7-9 hours of sleep. Contact a doctor if:  Your symptoms are  not helped by medicine.  You have a headache that feels different than the other headaches.  You feel sick to your stomach (nauseous) or you throw up (vomit).  You have a fever. Get help right away if:  Your headache becomes really bad.  You keep throwing up.  You have a stiff neck.  You have trouble seeing.  You have trouble speaking.  You have pain in the eye or ear.  Your muscles are weak or you lose muscle control.  You lose your balance or have trouble walking.  You feel like you will pass out (faint) or you pass out.  You have confusion. This information is not intended to replace advice given to you by your health care provider. Make sure you discuss any questions you have with your health care provider. Document Released: 01/23/2008 Document Revised: 09/21/2015 Document Reviewed: 08/08/2014 Elsevier Interactive Patient Education  2017 [employer].

## 2016-09-20 NOTE — Progress Notes (Signed)
Subjective:  Patient ID: David [name], male    DOB: 02/21/1961  Age: 56 y.o. MRN: [id number]  CC: Hypertension (high BP reading 137/92-141/96 and pulse is lower 60-65. not his normal, headache at times. )   Hypertension  This is a new problem. The current episode started in the past 7 days. The problem is unchanged. Associated symptoms include headaches. Pertinent negatives include no anxiety, blurred vision, chest pain, malaise/fatigue, neck pain, orthopnea, palpitations, peripheral edema, PND, shortness of breath or sweats. (Rates headache at 4/10.) There are no associated agents to hypertension. Risk factors for coronary artery disease include male gender. Past treatments include beta blockers. There are no compliance problems.  There is no history of a hypertension causing med or a thyroid problem.    Outpatient Medications Prior to Visit  Medication Sig Dispense Refill  . aspirin EC 81 MG EC tablet Take 1 tablet (81 mg total) by mouth daily.    . metoprolol succinate (TOPROL-XL) 25 MG 24 hr tablet TAKE 1 TABLET (25 MG TOTAL) BY MOUTH DAILY. 90 tablet 2  . tadalafil (CIALIS) 20 MG tablet Take 0.5-1 tablets (10-20 mg total) by mouth daily as needed for erectile dysfunction. 10 tablet 5  . warfarin (COUMADIN) 5 MG tablet TAKE 1-1.5 TABLETS BY MOUTH DAILY AS DIRECTED BY COUMADIN CLINIC 120 tablet 1  . loratadine (CLARITIN) 10 MG tablet Take 1 tablet (10 mg total) by mouth daily. (Patient taking differently: Take 10 mg by mouth every morning. ) 90 tablet 3  . tamsulosin (FLOMAX) 0.4 MG CAPS capsule Take 1 capsule (0.4 mg total) by mouth daily. (Patient not taking: Reported on 09/20/2016) 30 capsule 2   No facility-administered medications prior to visit.     ROS See HPI  Objective:  BP 138/90   Pulse 81   Temp 98.5 F (36.9 C)   Ht 5\' 10"  (1.778 m)   Wt 164 lb (74.4 kg)   SpO2 100%   BMI 23.53 kg/m   BP Readings from Last 3 Encounters:  09/20/16 138/90  03/08/16 132/82    02/09/16 134/84    Wt Readings from Last 3 Encounters:  09/20/16 164 lb (74.4 kg)  03/08/16 170 lb (77.1 kg)  02/09/16 167 lb (75.8 kg)    Physical Exam  Constitutional: He is oriented to person, place, and time. No distress.  HENT:  Mouth/Throat: No oropharyngeal exudate.  Eyes: No scleral icterus.  Neck: Normal range of motion. Neck supple. No JVD present.  Cardiovascular: Normal rate and regular rhythm.   Murmur heard. Pulmonary/Chest: Effort normal and breath sounds normal.  Musculoskeletal: Normal range of motion. He exhibits no edema.  Lymphadenopathy:    He has no cervical adenopathy.  Neurological: He is alert and oriented to person, place, and time. No cranial nerve deficit. Coordination normal.  Skin: Skin is warm and dry.  Vitals reviewed.   Lab Results  Component Value Date   WBC 2.7 Repeated and verified X2. (L) 03/11/2016   HGB 14.7 03/11/2016   HCT 45.0 03/11/2016   PLT 211.0 03/11/2016   GLUCOSE 82 09/20/2016   CHOL 244 (H) 03/11/2016   TRIG 78.0 03/11/2016   HDL 62.10 03/11/2016   LDLDIRECT 132.4 11/19/2011   LDLCALC 166 (H) 03/11/2016   ALT 25 03/11/2016   AST 22 03/11/2016   NA 139 09/20/2016   K 4.3 09/20/2016   CL 104 09/20/2016   CREATININE 1.08 09/20/2016   BUN 12 09/20/2016   CO2 30 09/20/2016   TSH  0.941 06/01/2014   PSA 2.51 11/19/2011   INR 3.6 (H) 09/20/2016   HGBA1C 5.6 03/11/2016    Assessment & Plan:   David [name] was seen today for hypertension.  Diagnoses and all orders for this visit:  Elevated BP without diagnosis of hypertension -     Basic metabolic panel; Future  Nonintractable episodic headache, unspecified headache type  Long term current use of anticoagulant therapy -     INR/PT; Future   I have discontinued David [name]'s loratadine and tamsulosin. I am also having him maintain his aspirin, tadalafil, metoprolol succinate, and warfarin.  No orders of the defined types were placed in this  encounter.   Follow-up: Return if symptoms worsen or fail to improve.  Wilfred Lacy, NP

## 2016-10-08 ENCOUNTER — Ambulatory Visit (INDEPENDENT_AMBULATORY_CARE_PROVIDER_SITE_OTHER): Payer: 59 | Admitting: Pharmacist Clinician (PhC)/ Clinical Pharmacy Specialist

## 2016-10-08 DIAGNOSIS — Z7901 Long term (current) use of anticoagulants: Secondary | ICD-10-CM

## 2016-10-08 DIAGNOSIS — I059 Rheumatic mitral valve disease, unspecified: Secondary | ICD-10-CM

## 2016-10-08 DIAGNOSIS — Z954 Presence of other heart-valve replacement: Secondary | ICD-10-CM | POA: Diagnosis not present

## 2016-10-08 LAB — POCT INR: INR: 2.4

## 2016-10-22 ENCOUNTER — Encounter (HOSPITAL_COMMUNITY): Payer: Self-pay | Admitting: *Deleted

## 2016-10-22 ENCOUNTER — Emergency Department (HOSPITAL_COMMUNITY)
Admission: EM | Admit: 2016-10-22 | Discharge: 2016-10-22 | Disposition: A | Discharge status: Home / Self Care | Payer: 59 | Attending: Emergency Medicine | Admitting: Emergency Medicine

## 2016-10-22 DIAGNOSIS — S4991XA Unspecified injury of right shoulder and upper arm, initial encounter: Secondary | ICD-10-CM | POA: Diagnosis present

## 2016-10-22 DIAGNOSIS — Y9389 Activity, other specified: Secondary | ICD-10-CM | POA: Insufficient documentation

## 2016-10-22 DIAGNOSIS — Y929 Unspecified place or not applicable: Secondary | ICD-10-CM | POA: Diagnosis not present

## 2016-10-22 DIAGNOSIS — W19XXXA Unspecified fall, initial encounter: Secondary | ICD-10-CM

## 2016-10-22 DIAGNOSIS — Z7901 Long term (current) use of anticoagulants: Secondary | ICD-10-CM | POA: Insufficient documentation

## 2016-10-22 DIAGNOSIS — Z8546 Personal history of malignant neoplasm of prostate: Secondary | ICD-10-CM | POA: Diagnosis not present

## 2016-10-22 DIAGNOSIS — Z7982 Long term (current) use of aspirin: Secondary | ICD-10-CM | POA: Insufficient documentation

## 2016-10-22 DIAGNOSIS — Z87891 Personal history of nicotine dependence: Secondary | ICD-10-CM | POA: Insufficient documentation

## 2016-10-22 DIAGNOSIS — S40811A Abrasion of right upper arm, initial encounter: Secondary | ICD-10-CM | POA: Insufficient documentation

## 2016-10-22 DIAGNOSIS — Y999 Unspecified external cause status: Secondary | ICD-10-CM | POA: Insufficient documentation

## 2016-10-22 DIAGNOSIS — W1789XA Other fall from one level to another, initial encounter: Secondary | ICD-10-CM | POA: Diagnosis not present

## 2016-10-22 LAB — PROTIME-INR
INR: 2.04
Prothrombin Time: 23.4 seconds — ABNORMAL HIGH (ref 11.4–15.2)

## 2016-10-22 NOTE — Discharge Instructions (Signed)
Please return to the ER immediately for any pain, numbness, tingling, weakness, headache, any additional symptoms. Please have a low threshold to return for recheck. Follow up with your primary care provider.

## 2016-10-22 NOTE — ED Triage Notes (Addendum)
Pt reports he was working in an attic this afternoon, fell through the ceiling.  Pt was at work.  Pt presents with a hematoma in his L medial FA and abrasion on his R upper arm.  Pt reports it was about 12 feet high.  Landed on his feet.  Pt denies any other complaints at this time.  Pt is ambulatory without difficulty.  Pt takes coumadin for mechanical valve in his heart.  Pt is requesting to have his PT checked.

## 2016-10-22 NOTE — ED Notes (Signed)
Pt reports he was able to grab hold onto a hook during the fall, slowing his decent.

## 2016-10-22 NOTE — ED Provider Notes (Signed)
[facility] DEPT Provider Note   CSN: [id number] Arrival date & time: 10/22/16  3546  By signing my name below, I, David [name], attest that this documentation has been prepared under the direction and in the presence of [facility], PA-C. Electronically Signed: Evelene [name], Scribe. 10/22/2016. 4:59 PM.  History   Chief Complaint Chief Complaint  Patient presents with  . Fall    The history is provided by the patient. No language interpreter was used.    HPI Comments:  David [name] is a 56 y.o. male who presents to the Emergency Department s/p fall today. Pt states he fell through a ceiling. He caught himself on the ceiling before he on the ground in a standing position. He was working in the attic this afternoon and the floor "gave out". He states that he was able to grab onto a hook and that slowed the fall. He also notes that the insulation was all underneath him, causing him more to slide down than fall abruptly. He denies head injury and LOC. He reports abrasions to his BUE which he sustained from the sheet rock her fell through. He notes mild-moderate overlying pain to the sites. Pt has no other acute complaints or associated symptoms at this time. No abdominal pain, CP, back pain, neck pain, dizziness, lightheadedness, or numbness/tingling. Tetanus was last received in 2009. Pt is currently on coumadin which he has been taking since 2016 following mitral valve replacement. In the past pt has been supratherapeutic on coumadin and is requesting a check today.   Past Medical History:  Diagnosis Date  . DDD (degenerative disc disease), lumbosacral   . Hemolytic anemia ([place]) 06/29/2014  . History of bacterial endocarditis    07/ 2001  enterococcal//  recurrence  11/ 2009  steptococcus viridans  . History of chest tube placement    1980's --- R SIDE  SPONTANEOUS  PNEUMOTHORAX  . History of Clostridium difficile colitis    2009  . History of periodontal disease   . Prostate  cancer [facility]) urologist--  dr herrick/  oncologist-- dr Tammi Klippel   Stage  T1c,  Gleason 3+4,  PSA 3.76,  vol 33.45cc  . Pulmonary nodules followed by dr Stanford Breed   right upper & lower lobe and left lower lobe--  stable per CT 08-29-2014  . S/P redo mitral valve replacement with metallic valve cardiologist--  dr Stanford Breed   08-11-2014--- 31 mm Sorin Carbomedics Optiform bileaflet mechanical prosthesis  . Thoracic radiculopathy    T7  (positional)  per PCP note--  pt doing yoga and swimming state has helpted  . Wears glasses   . Wears partial dentures    upper    Patient Active Problem List   Diagnosis Date Noted  . Routine general medical examination at a health care facility 03/08/2016  . Strain of lumbar paraspinal muscle 08/01/2015  . Left knee pain 08/01/2015  . Long term current use of anticoagulant therapy 08/19/2014  . S/P redo mitral valve replacement with metallic valve 56/81/2751  . Mitral valve regurgitation 08/09/2014  . Severe mitral regurgitation 08/03/2014  . Hx of mitral valve repair 08/03/2014  . Mitral regurgitation 07/19/2014  . Hemolytic anemia ([place]) 06/29/2014  . Cystitis 06/23/2014  . Malignant neoplasm of prostate ([place]) 06/23/2014  . Nodule of right lung 06/23/2014  . Symptomatic anemia 06/21/2014  . Prostate cancer ([place]) 06/07/2014  . Microcytic anemia   . Hyperlipidemia 05/31/2014  . Nonspecific abnormal electrocardiogram (ECG) (EKG) 11/19/2011  . HLD (hyperlipidemia) 01/12/2010  .  GERD 01/12/2010  . HIATAL HERNIA 05/12/2009  . Mitral valve disorder 06/29/2008    Past Surgical History:  Procedure Laterality Date  . CARDIAC CATHETERIZATION  03-15-2008  dr Lia Foyer   no sig. coronary disease/  normal LVF/  moderate pulmonary hypertension w/ elevated V wave  . COLONOSCOPY  last one 12/ 2013  . ESOPHAGOGASTRODUODENOSCOPY N/A 06/02/2014   Procedure: ESOPHAGOGASTRODUODENOSCOPY (EGD);  Surgeon: Jerene Bears, MD;  Location: [facility] ENDOSCOPY;  Service: Endoscopy;   Laterality: N/A;  . LEFT AND RIGHT HEART CATHETERIZATION WITH CORONARY ANGIOGRAM N/A 08/10/2014   Procedure: LEFT AND RIGHT HEART CATHETERIZATION WITH CORONARY ANGIOGRAM;  Surgeon: Belva Crome, MD;  Location[facility] C[facility] LAB;  Service: Cardiovascular;  Laterality: N/A;   EF 55%,  severe MR, severe pulm HTN,  40% LAD  . MITRAL VALVE REPAIR  03-22-2008   via  right mini thoracotomy  . MITRAL VALVE REPAIR N/A 08/11/2014   Procedure: REDO MITRAL VALVE (MV) REPLACEMENT;  Surgeon: Rexene Alberts, MD;  Location: [place];  Service: Open Heart Surgery;  Laterality: N/A;  NO NECK LINES ON RIGHT SIDE  . MULTIPLE TOOTH EXTRACTIONS  03-16-2008    w/ alveoloplasty  . PROSTATE BIOPSY  03/2014  . RADIOACTIVE SEED IMPLANT N/A 03/17/2015   Procedure: RADIOACTIVE SEED IMPLANT/BRACHYTHERAPY IMPLANT;  Surgeon: Ardis Hughs, MD;  Location: [facility];  Service: Urology;  Laterality: N/A;  DR PORTABLE  . TEE WITHOUT CARDIOVERSION N/A 07/19/2014   Procedure: TRANSESOPHAGEAL ECHOCARDIOGRAM (TEE);  Surgeon: Lelon Perla, MD;  Location: [facility] ENDOSCOPY;  Service: Cardiovascular;  Laterality: N/A;  . TEE WITHOUT CARDIOVERSION N/A 08/11/2014   Procedure: TRANSESOPHAGEAL ECHOCARDIOGRAM (TEE);  Surgeon: Rexene Alberts, MD;  Location: [place];  Service: Open Heart Surgery;  Laterality: N/A;  . TRANSTHORACIC ECHOCARDIOGRAM  09-23-2014   dr  Stanford Breed   mild focal basal LVH of the septum,  ef 45-50%,  normal wall motion/  mechanical bileaflet MV sits well in mitrial position, no sig. regurg., valve area 1.61 cm^2,  mean grandient 48mmHg/  mild LAE/  RA prominent Chiari malformation , unchanged from prior study, normal size/  mild TR/  mild to moderate pulmonic insuffiency       Home Medications    Prior to Admission medications   Medication Sig Start Date End Date Taking? Authorizing Provider  aspirin EC 81 MG EC tablet Take 1 tablet (81 mg total) by mouth daily. 08/17/14   Barrett, Erin R, PA-C  metoprolol  succinate (TOPROL-XL) 25 MG 24 hr tablet TAKE 1 TABLET (25 MG TOTAL) BY MOUTH DAILY. 05/21/16   Lelon Perla, MD  tadalafil (CIALIS) 20 MG tablet Take 0.5-1 tablets (10-20 mg total) by mouth daily as needed for erectile dysfunction. 02/28/15   Hendricks Limes, MD  warfarin (COUMADIN) 5 MG tablet TAKE 1-1.5 TABLETS BY MOUTH DAILY AS DIRECTED BY COUMADIN CLINIC 06/20/16   Lelon Perla, MD    Family History Family History  Problem Relation Age of Onset  . Ovarian cancer Sister   . Alzheimer's disease Father   . Diabetes Neg Hx   . Heart disease Neg Hx   . Stroke Neg Hx     Social History Social History  Substance Use Topics  . Smoking status: Former Smoker    Packs/day: 2.00    Years: 15.00    Types: Cigarettes    Quit date: 08/08/1989  . Smokeless tobacco: Never Used  . Alcohol use No     Comment: "stopped  drinking in 1991     Allergies   Clarithromycin   Review of Systems Review of Systems  Constitutional: Negative for fatigue.  Eyes: Negative for visual disturbance.  Respiratory: Negative for shortness of breath.   Cardiovascular: Negative for chest pain.  Gastrointestinal: Negative for abdominal pain, nausea and vomiting.  Musculoskeletal: Negative for arthralgias, back pain, gait problem, myalgias and neck pain.  Skin: Positive for wound.  Neurological: Negative for dizziness, syncope, weakness, light-headedness and numbness.     Physical Exam Updated Vital Signs BP 136/83 (BP Location: Right Arm)   Pulse 81   Temp 98.1 F (36.7 C) (Oral)   Resp 18   Ht 5' 10.5" (1.791 m)   Wt 165 lb (74.8 kg)   SpO2 100%   BMI 23.34 kg/m   Physical Exam  Constitutional: He appears well-developed and well-nourished. No distress.  HENT:  Head: Normocephalic and atraumatic. Head is without raccoon's eyes and without Battle's sign.  Right Ear: No hemotympanum.  Left Ear: No hemotympanum.  Neck: Neck supple.  No midline or paraspinal tenderness. Full ROM without  pain.  Cardiovascular: Normal rate, regular rhythm and normal heart sounds.   No murmur heard. Pulmonary/Chest: Effort normal and breath sounds normal. No respiratory distress. He has no wheezes. He has no rales. He exhibits no tenderness.  Abdominal: Soft. He exhibits no distension. There is no tenderness.  Musculoskeletal: Normal range of motion.  No midline or paraspinal tenderness. No tenderness to lower extremities. No bony tenderness to upper extremities. Mildly tender to palpation over superficial abrasions to UE's. Full ROM and 5/5 muscle strength x 4.   Neurological: He is alert.  Alert, oriented, thought content appropriate, able to give a coherent history. Speech is clear and goal oriented, able to follow commands.  Cranial Nerves:  II:  Peripheral visual fields grossly normal, pupils equal, round, reactive to light III, IV, VI: EOM intact bilaterally, ptosis not present V,VII: smile symmetric, eyes kept closed tightly against resistance, facial light touch sensation equal VIII: hearing grossly normal IX, X: symmetric soft palate movement, uvula elevates symmetrically  XI: bilateral shoulder shrug symmetric and strong XII: midline tongue extension 5/5 muscle strength in upper and lower extremities bilaterally including strong and equal grip strength and dorsiflexion/plantar flexion Sensory to light touch normal in all four extremities.  Normal finger-to-nose and rapid alternating movements; normal gait and balance. No drift.  Skin: Skin is warm and dry.  Superficial abrasion to proximal right arm.  Small hematoma to left forearm.  Nursing note and vitals reviewed.    ED Treatments / Results  DIAGNOSTIC STUDIES:  Oxygen Saturation is 100% on RA, normal by my interpretation.    COORDINATION OF CARE:  4:57 PM Discussed treatment plan with pt at bedside and pt agreed to plan.  Labs (all labs ordered are listed, but only abnormal results are displayed) Labs Reviewed - No  data to display  EKG  EKG Interpretation None       Radiology No results found.  Procedures Procedures (including critical care time)  Medications Ordered in ED Medications - No data to display   Initial Impression / Assessment and Plan / ED Course  I have reviewed the triage vital signs and the nursing notes.  Pertinent labs & imaging results that were available during my care of the patient were reviewed by me and considered in my medical decision making (see chart for details).    CORIN TILLY is a 56 y.o. male who presents to  ED for evaluation after a fall just prior to arrival. Patient fell approx. 12 feet through floor of the attic, but thankfully grabbed on to a hook which slowed the fall. He was also lucky enough to have a great deal of insulation under him, cushioning the fall. He has no complaints today other than superficial abrasion to the right arm which was thoroughly cleaned and dressed in ED today. Tetanus is up-to-date. He has a normal neuro exam and did not hit his head. He landed on his feet. Very thorough musk exam was performed with no abnormalities. No tenderness to neck, back, hips, upper/lower extremities, chest, ribs, abdomen. Full ROM, strength and sensation. No pain complaints. He is on Coumadin with INR of 2.04. Gave patient a very low threshold to return to ER for recheck given mechanism, however evaluation today does not show pathology that would require ongoing emergent intervention or inpatient treatment. Patient and wife at bedside understand return precautions. All questions answered.    Final Clinical Impressions(s) / ED Diagnoses   Final diagnoses:  None    New Prescriptions New Prescriptions   No medications on file   I personally performed the services described in this documentation, which was scribed in my presence. The recorded information has been reviewed and is accurate.     David [name], David Almond, PA-C 10/23/16 [id number]    Dorie Rank, MD 10/25/16 [phone]

## 2017-03-07 ENCOUNTER — Encounter: Payer: Self-pay | Admitting: *Deleted

## 2017-03-07 ENCOUNTER — Other Ambulatory Visit: Payer: Self-pay | Admitting: *Deleted

## 2017-03-07 DIAGNOSIS — R918 Other nonspecific abnormal finding of lung field: Secondary | ICD-10-CM

## 2017-03-10 ENCOUNTER — Other Ambulatory Visit: Payer: Self-pay | Admitting: Cardiology

## 2017-03-10 NOTE — Telephone Encounter (Signed)
REFILL 

## 2017-04-08 ENCOUNTER — Other Ambulatory Visit: Payer: Self-pay | Admitting: Cardiology

## 2017-05-14 ENCOUNTER — Other Ambulatory Visit: Payer: Self-pay | Admitting: Cardiology

## 2017-05-14 NOTE — Telephone Encounter (Signed)
Patient not following warfarin with [place] since June/2018.   Follow up done by PCP; they will have to refill his warfarin.

## 2017-05-14 NOTE — Telephone Encounter (Signed)
David [name] is calling because his pharmacy is telling him in order to refill his Warfarin  it has to authorized . Please call

## 2017-05-14 NOTE — Telephone Encounter (Signed)
Ok to refill coumadin David [name]

## 2017-05-15 MED ORDER — WARFARIN SODIUM 5 MG PO TABS: ORAL_TABLET | ORAL | 0 refills | Status: DC

## 2017-05-15 NOTE — Telephone Encounter (Signed)
°  Follow Up  Pt needs some clarification regarding his Warfarin prescription. Please call.

## 2017-05-16 NOTE — Telephone Encounter (Signed)
Attempted to call patient. His VM is full. MyChart message sent notifying him of [facility] info below.

## 2017-05-19 ENCOUNTER — Ambulatory Visit (INDEPENDENT_AMBULATORY_CARE_PROVIDER_SITE_OTHER): Payer: BLUE CROSS/BLUE SHIELD | Admitting: Pharmacist Clinician (PhC)/ Clinical Pharmacy Specialist

## 2017-05-19 DIAGNOSIS — I059 Rheumatic mitral valve disease, unspecified: Secondary | ICD-10-CM | POA: Diagnosis not present

## 2017-05-19 DIAGNOSIS — Z954 Presence of other heart-valve replacement: Secondary | ICD-10-CM

## 2017-05-19 DIAGNOSIS — Z7901 Long term (current) use of anticoagulants: Secondary | ICD-10-CM

## 2017-05-19 LAB — POCT INR: INR: 2.4

## 2017-05-27 ENCOUNTER — Other Ambulatory Visit: Payer: Self-pay | Admitting: Cardiology

## 2017-05-28 NOTE — Telephone Encounter (Signed)
°*  STAT* If patient is at the pharmacy, call can be transferred to refill team.   1. Which medications need to be refilled? (please list name of each medication and dose if known) Warfarin  2. Which pharmacy/location (including street and city if local pharmacy) is medication to be sent to?[facility],  3. Do they need a 30 day or 90 day supply? 45 and refills*

## 2017-06-07 ENCOUNTER — Other Ambulatory Visit: Payer: Self-pay | Admitting: Cardiology

## 2017-06-09 ENCOUNTER — Ambulatory Visit (INDEPENDENT_AMBULATORY_CARE_PROVIDER_SITE_OTHER): Payer: BLUE CROSS/BLUE SHIELD | Admitting: Pharmacist Clinician (PhC)/ Clinical Pharmacy Specialist

## 2017-06-09 DIAGNOSIS — I059 Rheumatic mitral valve disease, unspecified: Secondary | ICD-10-CM

## 2017-06-09 DIAGNOSIS — Z7901 Long term (current) use of anticoagulants: Secondary | ICD-10-CM

## 2017-06-09 DIAGNOSIS — Z954 Presence of other heart-valve replacement: Secondary | ICD-10-CM

## 2017-06-09 LAB — POCT INR: INR: 2.8

## 2017-06-14 ENCOUNTER — Other Ambulatory Visit: Payer: Self-pay | Admitting: Cardiology

## 2017-06-24 ENCOUNTER — Other Ambulatory Visit: Payer: Self-pay | Admitting: Cardiology

## 2017-07-07 ENCOUNTER — Other Ambulatory Visit: Payer: Self-pay | Admitting: Cardiology

## 2017-07-08 ENCOUNTER — Telehealth: Payer: Self-pay | Admitting: Cardiology

## 2017-07-08 MED ORDER — WARFARIN SODIUM 5 MG PO TABS: ORAL_TABLET | ORAL | 0 refills | Status: DC

## 2017-07-08 NOTE — Telephone Encounter (Signed)
NEW MESSAGE    If Home Health RN is calling please get Coumadin Nurse on the phone STAT  1.  Are you calling in regards to an appointment? NO  2.  Are you calling for a refill ? YES, REFILL NEEDED  3.  Are you having bleeding issues? NO  4.  Do you need clearance to hold Coumadin? NO   Please route to the Coumadin Clinic Pool

## 2017-07-09 ENCOUNTER — Ambulatory Visit (INDEPENDENT_AMBULATORY_CARE_PROVIDER_SITE_OTHER): Payer: BLUE CROSS/BLUE SHIELD | Admitting: Pharmacist Clinician (PhC)/ Clinical Pharmacy Specialist

## 2017-07-09 DIAGNOSIS — I059 Rheumatic mitral valve disease, unspecified: Secondary | ICD-10-CM | POA: Diagnosis not present

## 2017-07-09 DIAGNOSIS — Z7901 Long term (current) use of anticoagulants: Secondary | ICD-10-CM | POA: Diagnosis not present

## 2017-07-09 DIAGNOSIS — Z954 Presence of other heart-valve replacement: Secondary | ICD-10-CM

## 2017-07-09 LAB — POCT INR: INR: 2.4

## 2017-07-09 NOTE — Patient Instructions (Signed)
Description   Continue with 1 tablet daily.  Repeat INR in 4 weeks      

## 2017-07-15 ENCOUNTER — Telehealth: Payer: Self-pay | Admitting: Cardiology

## 2017-07-15 NOTE — Telephone Encounter (Signed)
New message    Patient wants to know if its okay for him to see Curt Bears for his 2 year check up

## 2017-07-15 NOTE — Progress Notes (Signed)
Cardiology Office Note   Date:  07/16/2017   ID:  [name], DOB 01-12-1961, MRN [id number]  PCP:  [place], FNP  Cardiologist: Dr. Stanford Breed Chief Complaint  Patient presents with  . Follow-up  . Chest Pain     History of Present Illness: David [name] is a 57 y.o. male who presents for ongoing assessment and management of history of endocarditis, mitral valve repair with mini thoracotomy, due to severe MR, severe pulmonary hypertension, the patient has a bileaflet mechanical prosthesis which was placed in April 2016.  He was last seen by Dr. Stanford Breed on 12/07/2015 at which time he was complaining of occasional pain in the left lateral chest area and breast area.  This lasted approximately 1 minute and resolved spontaneously.  When last seen by Dr. Stanford Breed, his chest pain symptoms were believed to be atypical and not sound cardiac in etiology.  It was felt that the patient had musculoskeletal pain.  The patient was to continue Coumadin and aspirin and SBE prophylaxis for mitral valve replacement.  He is here for annual year follow-up.  He is here today without any complaints.  He just needs medication refills.  He has had no new diagnoses, he has had no operations, ER visits, or need for antibiotic prophylaxis.    Past Medical History:  Diagnosis Date  . DDD (degenerative disc disease), lumbosacral   . Hemolytic anemia ([place]) 06/29/2014  . History of bacterial endocarditis    07/ 2001  enterococcal//  recurrence  11/ 2009  steptococcus viridans  . History of chest tube placement    1980's --- R SIDE  SPONTANEOUS  PNEUMOTHORAX  . History of Clostridium difficile colitis    2009  . History of periodontal disease   . Prostate cancer [facility]) urologist--  dr herrick/  oncologist-- dr Tammi Klippel   Stage  T1c,  Gleason 3+4,  PSA 3.76,  vol 33.45cc  . Pulmonary nodules followed by dr Stanford Breed   right upper & lower lobe and left lower lobe--  stable per CT 08-29-2014  . S/P redo  mitral valve replacement with metallic valve cardiologist--  dr Stanford Breed   08-11-2014--- 31 mm Sorin Carbomedics Optiform bileaflet mechanical prosthesis  . Thoracic radiculopathy    T7  (positional)  per PCP note--  pt doing yoga and swimming state has helpted  . Wears glasses   . Wears partial dentures    upper    Past Surgical History:  Procedure Laterality Date  . CARDIAC CATHETERIZATION  03-15-2008  dr Lia Foyer   no sig. coronary disease/  normal LVF/  moderate pulmonary hypertension w/ elevated V wave  . COLONOSCOPY  last one 12/ 2013  . ESOPHAGOGASTRODUODENOSCOPY N/A 06/02/2014   Procedure: ESOPHAGOGASTRODUODENOSCOPY (EGD);  Surgeon: Jerene Bears, MD;  Location: [facility] ENDOSCOPY;  Service: Endoscopy;  Laterality: N/A;  . LEFT AND RIGHT HEART CATHETERIZATION WITH CORONARY ANGIOGRAM N/A 08/10/2014   Procedure: LEFT AND RIGHT HEART CATHETERIZATION WITH CORONARY ANGIOGRAM;  Surgeon: Belva Crome, MD;  Location: [facility] CATH LAB;  Service: Cardiovascular;  Laterality: N/A;   EF 55%,  severe MR, severe pulm HTN,  40% LAD  . MITRAL VALVE REPAIR  03-22-2008   via  right mini thoracotomy  . MITRAL VALVE REPAIR N/A 08/11/2014   Procedure: REDO MITRAL VALVE (MV) REPLACEMENT;  Surgeon: Rexene Alberts, MD;  Location: [place];  Service: Open Heart Surgery;  Laterality: N/A;  NO NECK LINES ON RIGHT SIDE  . MULTIPLE TOOTH EXTRACTIONS  03-16-2008  w/ alveoloplasty  . PROSTATE BIOPSY  03/2014  . RADIOACTIVE SEED IMPLANT N/A 03/17/2015   Procedure: RADIOACTIVE SEED IMPLANT/BRACHYTHERAPY IMPLANT;  Surgeon: Ardis Hughs, MD;  Location: [facility];  Service: Urology;  Laterality: N/A;  DR PORTABLE  . TEE WITHOUT CARDIOVERSION N/A 07/19/2014   Procedure: TRANSESOPHAGEAL ECHOCARDIOGRAM (TEE);  Surgeon: Lelon Perla, MD;  Location: [facility] ENDOSCOPY;  Service: Cardiovascular;  Laterality: N/A;  . TEE WITHOUT CARDIOVERSION N/A 08/11/2014   Procedure: TRANSESOPHAGEAL ECHOCARDIOGRAM (TEE);  Surgeon:  Rexene Alberts, MD;  Location: [place];  Service: Open Heart Surgery;  Laterality: N/A;  . TRANSTHORACIC ECHOCARDIOGRAM  09-23-2014   dr  Stanford Breed   mild focal basal LVH of the septum,  ef 45-50%,  normal wall motion/  mechanical bileaflet MV sits well in mitrial position, no sig. regurg., valve area 1.61 cm^2,  mean grandient 52mmHg/  mild LAE/  RA prominent Chiari malformation , unchanged from prior study, normal size/  mild TR/  mild to moderate pulmonic insuffiency     Current Outpatient Medications  Medication Sig Dispense Refill  . aspirin EC 81 MG EC tablet Take 1 tablet (81 mg total) by mouth daily.    [name] loratadine (CLARITIN) 10 MG tablet Take 10 mg by mouth daily as needed for allergies.    . metoprolol succinate (TOPROL-XL) 25 MG 24 hr tablet TAKE 1 TABLET (25 MG TOTAL) DAILY BY MOUTH. NEED OFFICE VISIT. 90 tablet 0  . metoprolol succinate (TOPROL-XL) 25 MG 24 hr tablet Take 25 mg by mouth daily.    . tadalafil (CIALIS) 20 MG tablet Take 0.5-1 tablets (10-20 mg total) by mouth daily as needed for erectile dysfunction. 10 tablet 5  . warfarin (COUMADIN) 5 MG tablet Take 1 to 1.5 tablets by mouth daily as directed 120 tablet 0   No current facility-administered medications for this visit.     Allergies:   Clarithromycin    Social History:  The patient  reports that he quit smoking about 27 years ago. His smoking use included cigarettes. He has a 30.00 pack-year smoking history. he has never used smokeless tobacco. He reports that he does not drink alcohol or use drugs.   Family History:  The patient's family history includes Alzheimer's disease in his father; Ovarian cancer in his sister.    ROS: All other systems are reviewed and negative. Unless otherwise mentioned in H&P    PHYSICAL EXAM: VS:  BP 120/76   Pulse 80   Ht 5' 10.5" (1.791 m)   Wt 159 lb 9.6 oz (72.4 kg)   BMI 22.58 kg/m  , BMI Body mass index is 22.58 kg/m. GEN: Well nourished, well developed, in no acute  distress  HEENT: normal  Neck: no JVD, carotid bruits, or masses Cardiac: RRR; crisp murmurs, no rubs, or gallops,no edema  Respiratory:  clear to auscultation bilaterally, normal work of breathing GI: soft, nontender, nondistended, + BS MS: no deformity or atrophy  Skin: warm and dry, no rash Neuro:  Strength and sensation are intact Psych: euthymic mood, full affect   EKG: Normal sinus rhythm with first-degree AV block, HR 80 bpm  Recent Labs: 09/20/2016: BUN 12; Creatinine, Ser 1.08; Potassium 4.3; Sodium 139    Lipid Panel    Component Value Date/Time   CHOL 244 (H) 03/11/2016 0736   TRIG 78.0 03/11/2016 0736   HDL 62.10 03/11/2016 0736   CHOLHDL 4 03/11/2016 0736   VLDL 15.6 03/11/2016 0736   LDLCALC 166 (H) 03/11/2016  0736   LDLDIRECT 132.4 11/19/2011 1135      Wt Readings from Last 3 Encounters:  07/16/17 159 lb 9.6 oz (72.4 kg)  10/22/16 165 lb (74.8 kg)  09/20/16 164 lb (74.4 kg)   Other studies Reviewed: Left ventricle: The cavity size was normal. There was mild focal   basal hypertrophy of the septum. Systolic function was mildly   reduced. The estimated ejection fraction was in the range of 45%   to 50%. Wall motion was normal; there were no regional wall   motion abnormalities. The study is not technically sufficient to   allow evaluation of LV diastolic function. - Mitral valve: Mechanical bileaflet valve sits well in the mitral   position. There is no central or paravalvular regurgitation.   Leaflets appear to open well.   Transmitral gradient is minimally elevated with mean gradient 6   mmHg. Valve area by continuity equation (using LVOT flow): 1.61   cm^2. - Left atrium: The atrium was mildly dilated. - Right ventricle: The cavity size was normal. Wall thickness was   normal. Systolic function was normal. - Right atrium: There is a prominent Chiari malformation -   unchanged from the prior study. The atrium was normal in size. - Tricuspid valve:  There was mild regurgitation. - Pulmonic valve: There was mild to moderate regurgitation. - Pulmonary arteries: Systolic pressure was within the normal   range. - Inferior vena cava: The vessel was normal in size. The   respirophasic diameter changes were in the normal range (= 50%),   consistent with normal central venous pressure. - Pericardium, extracardiac: There was no pericardial effusion.  Impressions:  - When compared to the prior study from July 19, 2014 there is now   well functioning mechanical mitral valve.   LVEF is slightly decreased estimated at 45-50%.  ASSESSMENT AND PLAN:  1.   Mitral valve disease: Status post mitral valve repair in the setting of bacterial endocarditis.  He is without complaints today.  He has had no need for any antibiotic prophylaxis at this time.  He will need to have amoxicillin 2 g 1 hour before any dental procedures.  Patient is given refills on metoprolol 25 mg daily.  No further testing at this time.  Can consider follow-up echocardiogram office visit.  2.  Coumadin therapy: Followed in our Coumadin clinic with PT/INR.  Refills provided by pharmacist.   Current medicines are reviewed at length with the patient today.    Labs/ tests ordered today include: none  Phill Myron. West Pugh, ANP, AACC   07/16/2017 2:46 PM    [facility] [address], [place], [place] [address] Phone: [phone]; Fax: [phone]

## 2017-07-15 NOTE — Telephone Encounter (Signed)
Returned call to patient he will keep appointment with Jory Sims DNP tomorrow 07/16/17 at 2:30 pm.

## 2017-07-16 ENCOUNTER — Ambulatory Visit: Payer: BLUE CROSS/BLUE SHIELD | Admitting: Adult Health

## 2017-07-16 ENCOUNTER — Encounter: Payer: Self-pay | Admitting: Adult Health

## 2017-07-16 VITALS — BP 120/76 | HR 80 | Ht 70.5 in | Wt 159.6 lb

## 2017-07-16 DIAGNOSIS — Z954 Presence of other heart-valve replacement: Secondary | ICD-10-CM

## 2017-07-16 DIAGNOSIS — I330 Acute and subacute infective endocarditis: Secondary | ICD-10-CM | POA: Diagnosis not present

## 2017-07-16 DIAGNOSIS — I340 Nonrheumatic mitral (valve) insufficiency: Secondary | ICD-10-CM

## 2017-07-16 MED ORDER — METOPROLOL SUCCINATE ER 25 MG PO TB24: ORAL_TABLET | ORAL | 3 refills | Status: DC

## 2017-07-16 NOTE — Patient Instructions (Signed)
Medication Instructions:  NO CHANGES- Your physician recommends that you continue on your current medications as directed. Please refer to the Current Medication list given to you today.  If you need a refill on your cardiac medications before your next appointment, please call your pharmacy.  Follow-Up: Your physician wants you to follow-up in: [place] should receive a reminder letter in the mail two months in advance. If you do not receive a letter, please call our office [phone] to schedule the 06-2018 follow-up appointment.   Thank you for choosing [facility]!!

## 2017-07-31 ENCOUNTER — Encounter: Payer: Self-pay | Admitting: *Deleted

## 2017-08-18 ENCOUNTER — Ambulatory Visit: Payer: BLUE CROSS/BLUE SHIELD | Admitting: Family Medicine

## 2017-08-20 ENCOUNTER — Ambulatory Visit (INDEPENDENT_AMBULATORY_CARE_PROVIDER_SITE_OTHER): Payer: BLUE CROSS/BLUE SHIELD | Admitting: Pharmacist

## 2017-08-20 DIAGNOSIS — I059 Rheumatic mitral valve disease, unspecified: Secondary | ICD-10-CM

## 2017-08-20 DIAGNOSIS — Z7901 Long term (current) use of anticoagulants: Secondary | ICD-10-CM

## 2017-08-20 DIAGNOSIS — Z954 Presence of other heart-valve replacement: Secondary | ICD-10-CM

## 2017-08-20 LAB — POCT INR: INR: 2.7

## 2017-08-20 NOTE — Patient Instructions (Signed)
Description   Continue with 1 tablet daily.  Repeat INR in 4 weeks      

## 2017-08-25 ENCOUNTER — Ambulatory Visit (INDEPENDENT_AMBULATORY_CARE_PROVIDER_SITE_OTHER): Payer: BLUE CROSS/BLUE SHIELD | Admitting: Family Medicine

## 2017-08-25 ENCOUNTER — Encounter: Payer: Self-pay | Admitting: Family Medicine

## 2017-08-25 VITALS — BP 118/78 | HR 74 | Ht 70.5 in | Wt 164.1 lb

## 2017-08-25 DIAGNOSIS — Z0000 Encounter for general adult medical examination without abnormal findings: Secondary | ICD-10-CM

## 2017-08-25 DIAGNOSIS — D709 Neutropenia, unspecified: Secondary | ICD-10-CM

## 2017-08-25 LAB — COMPREHENSIVE METABOLIC PANEL
ALT: 20 U/L (ref 0–53)
AST: 20 U/L (ref 0–37)
Albumin: 4.3 g/dL (ref 3.5–5.2)
Alkaline Phosphatase: 58 U/L (ref 39–117)
BUN: 13 mg/dL (ref 6–23)
CO2: 34 mEq/L — ABNORMAL HIGH (ref 19–32)
Calcium: 9.5 mg/dL (ref 8.4–10.5)
Chloride: 104 mEq/L (ref 96–112)
Creatinine, Ser: 0.82 mg/dL (ref 0.40–1.50)
GFR: 124.21 mL/min (ref >60.00)
Glucose, Bld: 92 mg/dL (ref 70–99)
Potassium: 4.3 mEq/L (ref 3.5–5.1)
Sodium: 141 mEq/L (ref 135–145)
Total Bilirubin: 0.5 mg/dL (ref 0.2–1.2)
Total Protein: 6.8 g/dL (ref 6.0–8.3)

## 2017-08-25 LAB — URINALYSIS, ROUTINE W REFLEX MICROSCOPIC
Bilirubin Urine: NEGATIVE
Hgb urine dipstick: NEGATIVE
Ketones, ur: NEGATIVE
Leukocytes, UA: NEGATIVE
Nitrite: NEGATIVE
RBC / HPF: NONE SEEN (ref 0–?)
Specific Gravity, Urine: <=1.005 — AB (ref 1.000–1.030)
Total Protein, Urine: NEGATIVE
Urine Glucose: NEGATIVE
Urobilinogen, UA: 0.2 (ref 0.0–1.0)
pH: 7 (ref 5.0–8.0)

## 2017-08-25 LAB — LIPID PANEL
Cholesterol: 225 mg/dL — ABNORMAL HIGH (ref 0–200)
HDL: 64.1 mg/dL (ref >39.00)
LDL Cholesterol: 146 mg/dL — ABNORMAL HIGH (ref 0–99)
NonHDL: 161.1
Total CHOL/HDL Ratio: 4
Triglycerides: 76 mg/dL (ref 0.0–149.0)
VLDL: 15.2 mg/dL (ref 0.0–40.0)

## 2017-08-25 LAB — CBC
HCT: 42.9 % (ref 39.0–52.0)
Hemoglobin: 14.1 g/dL (ref 13.0–17.0)
MCHC: 32.9 g/dL (ref 30.0–36.0)
MCV: 90.9 fl (ref 78.0–100.0)
Platelets: 188 10*3/uL (ref 150.0–400.0)
RBC: 4.71 Mil/uL (ref 4.22–5.81)
RDW: 14.3 % (ref 11.5–15.5)
WBC: 2.2 10*3/uL — ABNORMAL LOW (ref 4.0–10.5)

## 2017-08-25 LAB — TSH: TSH: 0.81 u[IU]/mL (ref 0.35–4.50)

## 2017-08-25 NOTE — Progress Notes (Addendum)
Subjective:  Patient ID: David [name], male    DOB: [birth year]  Age: 57 y.o. MRN: [id number]  CC: Annual Exam and Establish Care   HPI David [name] presents for establishment of care and a complete physical exam.  He has a significant past medical history to include prostate cancer treated with seeding, mitral valve replacement and a history of bacterial endocarditis.  He does not smoke, drink alcohol or use illicit drugs.  He lives with his wife mother in law and 77 year old son.  He works in as an Doctor, general practice.  He is active on his job.  He had a colonoscopy in 2017.[name]  And was advised to come back in 10 years.  Both of his parents passed in their 56s of old age.  He continues to see urology every 3 months for follow-up on his prostate cancer.  He says that he is doing well for this.  He continues on Coumadin because of his mechanical mitral heart valve.  History [name] has a past medical history of DDD (degenerative disc disease), lumbosacral, Hemolytic anemia ([place]) (06/29/2014), History of bacterial endocarditis, History of chest tube placement, History of Clostridium difficile colitis, History of periodontal disease, Prostate cancer [facility] (urologist--  dr herrick/  oncologist-- dr Tammi Klippel), Pulmonary nodules (followed by dr Stanford Breed), S/P redo mitral valve replacement with metallic valve (cardiologist--  dr Stanford Breed), Thoracic radiculopathy, Wears glasses, and Wears partial dentures.   He has a past surgical history that includes Colonoscopy (last one 12/ 2013); Multiple tooth extractions (03-16-2008); Mitral valve repair (03-22-2008); Prostate biopsy (03/2014); Esophagogastroduodenoscopy (N/A, 06/02/2014); TEE without cardioversion (N/A, 07/19/2014); left and right heart catheterization with coronary angiogram (N/A, 08/10/2014); Mitral valve repair (N/A, 08/11/2014); TEE without cardioversion (N/A, 08/11/2014); transthoracic echocardiogram (09-23-2014   dr  Stanford Breed); Cardiac  catheterization (03-15-2008  dr Lia Foyer); and Radioactive seed implant (N/A, 03/17/2015).   His family history includes Alzheimer's disease in his father; Ovarian cancer in his sister.He reports that he quit smoking about 28 years ago. His smoking use included cigarettes. He has a 30.00 pack-year smoking history. He has never used smokeless tobacco. He reports that he does not drink alcohol or use drugs.  Outpatient Medications Prior to Visit  Medication Sig Dispense Refill  . aspirin EC 81 MG EC tablet Take 1 tablet (81 mg total) by mouth daily.    [name] loratadine (CLARITIN) 10 MG tablet Take 10 mg by mouth daily as needed for allergies.    . metoprolol succinate (TOPROL-XL) 25 MG 24 hr tablet TAKE 1 TABLET (25 MG TOTAL) DAILY BY MOUTH. 90 tablet 3  . tadalafil (CIALIS) 20 MG tablet Take 0.5-1 tablets (10-20 mg total) by mouth daily as needed for erectile dysfunction. 10 tablet 5  . warfarin (COUMADIN) 5 MG tablet Take 1 to 1.5 tablets by mouth daily as directed 120 tablet 0   No facility-administered medications prior to visit.     ROS Review of Systems  Constitutional: Negative.   HENT: Negative.   Eyes: Negative.   Respiratory: Negative.   Cardiovascular: Negative.   Gastrointestinal: Negative.   Endocrine: Negative for polyphagia and polyuria.  Genitourinary: Negative for decreased urine volume, difficulty urinating and hematuria.  Musculoskeletal: Negative for gait problem and myalgias.  Skin: Negative.   Neurological: Negative for headaches.  Hematological: Negative.   Psychiatric/Behavioral: Negative.     Objective:  BP 118/78   Pulse 74   Ht 5' 10.5" (1.791 m)   Wt 164 lb 2 oz (74.4 kg)  SpO2 95%   BMI 23.22 kg/m   Physical Exam  Constitutional: He is oriented to person, place, and time. He appears well-developed and well-nourished. No distress.  HENT:  Head: Normocephalic and atraumatic.  Right Ear: External ear normal.  Left Ear: External ear normal.  Nose:  Nose normal.  Mouth/Throat: Oropharynx is clear and moist. No oropharyngeal exudate.  Eyes: Pupils are equal, round, and reactive to light. Conjunctivae and EOM are normal. Right eye exhibits no discharge. Left eye exhibits no discharge. No scleral icterus.  Neck: Normal range of motion. Neck supple. No tracheal deviation present. No thyromegaly present.  Cardiovascular: Normal rate and regular rhythm.  Click heard cw ho mitral valve replacement.   Pulmonary/Chest: Effort normal and breath sounds normal.  Abdominal: Soft. Bowel sounds are normal. Hernia confirmed negative in the right inguinal area and confirmed negative in the left inguinal area.  Genitourinary: Penis normal. Right testis shows no mass, no swelling and no tenderness. Left testis shows no mass, no swelling and no tenderness. No penile tenderness. No discharge found.  Musculoskeletal: Normal range of motion.  Lymphadenopathy:    He has no cervical adenopathy. No inguinal adenopathy noted on the right or left side.  Neurological: He is alert and oriented to person, place, and time.  Skin: Skin is warm and dry. He is not diaphoretic.  Psychiatric: He has a normal mood and affect. His behavior is normal.      Assessment & Plan:   [name] was seen today for annual exam and establish care.  Diagnoses and all orders for this visit:  Healthcare maintenance -     CBC -     Comprehensive metabolic panel -     HIV antibody -     Lipid panel -     TSH -     Urinalysis, Routine w reflex microscopic  Neutropenia, unspecified type [facility]) -     Ambulatory referral to Hematology   I am having David [name]. David [name] maintain his aspirin, tadalafil, warfarin, loratadine, and metoprolol succinate.  No orders of the defined types were placed in this encounter.  Anticipatory guidance was given on health maintenance and prevention.  Chart review shows that his cholesterol has been elevated in the past and anticipate need for follow-up in  3 months. Follow-up: Return in about 3 months (around 11/24/2017).  [name], MD

## 2017-08-25 NOTE — Patient Instructions (Signed)
Health Maintenance, Male A healthy lifestyle and preventive care is important for your health and wellness. Ask your health care provider about what schedule of regular examinations is right for you. What should I know about weight and diet? Eat a Healthy Diet  Eat plenty of vegetables, fruits, whole grains, low-fat dairy products, and lean protein.  Do not eat a lot of foods high in solid fats, added sugars, or salt.  Maintain a Healthy Weight Regular exercise can help you achieve or maintain a healthy weight. You should:  Do at least 150 minutes of exercise each week. The exercise should increase your heart rate and make you sweat (moderate-intensity exercise).  Do strength-training exercises at least twice a week.  Watch Your Levels of Cholesterol and Blood Lipids  Have your blood tested for lipids and cholesterol every 5 years starting at 58 years of age. If you are at high risk for heart disease, you should start having your blood tested when you are 58 years old. You may need to have your cholesterol levels checked more often if: ? Your lipid or cholesterol levels are high. ? You are older than 58 years of age. ? You are at high risk for heart disease.  What should I know about cancer screening? Many types of cancers can be detected early and may often be prevented. Lung Cancer  You should be screened every year for lung cancer if: ? You are a current smoker who has smoked for at least 30 years. ? You are a former smoker who has quit within the past 15 years.  Talk to your health care provider about your screening options, when you should start screening, and how often you should be screened.  Colorectal Cancer  Routine colorectal cancer screening usually begins at 58 years of age and should be repeated every 5-10 years until you are 58 years old. You may need to be screened more often if early forms of precancerous polyps or small growths are found. Your health care provider  may recommend screening at an earlier age if you have risk factors for colon cancer.  Your health care provider may recommend using home test kits to check for hidden blood in the stool.  A small camera at the end of a tube can be used to examine your colon (sigmoidoscopy or colonoscopy). This checks for the earliest forms of colorectal cancer.  Prostate and Testicular Cancer  Depending on your age and overall health, your health care provider may do certain tests to screen for prostate and testicular cancer.  Talk to your health care provider about any symptoms or concerns you have about testicular or prostate cancer.  Skin Cancer  Check your skin from head to toe regularly.  Tell your health care provider about any new moles or changes in moles, especially if: ? There is a change in a mole's size, shape, or color. ? You have a mole that is larger than a pencil eraser.  Always use sunscreen. Apply sunscreen liberally and repeat throughout the day.  Protect yourself by wearing long sleeves, pants, a wide-brimmed hat, and sunglasses when outside.  What should I know about heart disease, diabetes, and high blood pressure?  If you are 89-13 years of age, have your blood pressure checked every 3-5 years. If you are 70 years of age or older, have your blood pressure checked every year. You should have your blood pressure measured twice-once when you are at a hospital or clinic, and once when  you are not at a hospital or clinic. Record the average of the two measurements. To check your blood pressure when you are not at a hospital or clinic, you can use: ? An automated blood pressure machine at a pharmacy. ? A home blood pressure monitor.  Talk to your health care provider about your target blood pressure.  If you are between 59-6 years old, ask your health care provider if you should take aspirin to prevent heart disease.  Have regular diabetes screenings by checking your fasting blood  sugar level. ? If you are at a normal weight and have a low risk for diabetes, have this test once every three years after the age of 32. ? If you are overweight and have a high risk for diabetes, consider being tested at a younger age or more often.  A one-time screening for abdominal aortic aneurysm (AAA) by ultrasound is recommended for men aged 27-75 years who are current or former smokers. What should I know about preventing infection? Hepatitis B If you have a higher risk for hepatitis B, you should be screened for this virus. Talk with your health care provider to find out if you are at risk for hepatitis B infection. Hepatitis C Blood testing is recommended for:  Everyone born from [birth year] through 1966.  Anyone with known risk factors for hepatitis C.  Sexually Transmitted Diseases (STDs)  You should be screened each year for STDs including gonorrhea and chlamydia if: ? You are sexually active and are younger than 58 years of age. ? You are older than 58 years of age and your health care provider tells you that you are at risk for this type of infection. ? Your sexual activity has changed since you were last screened and you are at an increased risk for chlamydia or gonorrhea. Ask your health care provider if you are at risk.  Talk with your health care provider about whether you are at high risk of being infected with HIV. Your health care provider may recommend a prescription medicine to help prevent HIV infection.  What else can I do?  Schedule regular health, dental, and eye exams.  Stay current with your vaccines (immunizations).  Do not use any tobacco products, such as cigarettes, chewing tobacco, and e-cigarettes. If you need help quitting, ask your health care provider.  Limit alcohol intake to no more than 2 drinks per day. One drink equals 12 ounces of beer, 5 ounces of wine, or 1 ounces of hard liquor.  Do not use street drugs.  Do not share needles.  Ask your  health care provider for help if you need support or information about quitting drugs.  Tell your health care provider if you often feel depressed.  Tell your health care provider if you have ever been abused or do not feel safe at home. This information is not intended to replace advice given to you by your health care provider. Make sure you discuss any questions you have with your health care provider. Document Released: 10/12/2007 Document Revised: 12/13/2015 Document Reviewed: 01/17/2015 Elsevier Interactive Patient Education  2018 [place] Years, Male Preventive care refers to lifestyle choices and visits with your health care provider that can promote health and wellness. What does preventive care include?  A yearly physical exam. This is also called an annual well check.  Dental exams once or twice a year.  Routine eye exams. Ask your health care provider how often you should have  your eyes checked.  Personal lifestyle choices, including: ? Daily care of your teeth and gums. ? Regular physical activity. ? Eating a healthy diet. ? Avoiding tobacco and drug use. ? Limiting alcohol use. ? Practicing safe sex. ? Taking low-dose aspirin every day starting at age 13. What happens during an annual well check? The services and screenings done by your health care provider during your annual well check will depend on your age, overall health, lifestyle risk factors, and family history of disease. Counseling Your health care provider may ask you questions about your:  Alcohol use.  Tobacco use.  Drug use.  Emotional well-being.  Home and relationship well-being.  Sexual activity.  Eating habits.  Work and work Statistician.  Screening You may have the following tests or measurements:  Height, weight, and BMI.  Blood pressure.  Lipid and cholesterol levels. These may be checked every 5 years, or more frequently if you are over 58 years  old.  Skin check.  Lung cancer screening. You may have this screening every year starting at age 90 if you have a 30-pack-year history of smoking and currently smoke or have quit within the past 15 years.  Fecal occult blood test (FOBT) of the stool. You may have this test every year starting at age 33.  Flexible sigmoidoscopy or colonoscopy. You may have a sigmoidoscopy every 5 years or a colonoscopy every 10 years starting at age 37.  Prostate cancer screening. Recommendations will vary depending on your family history and other risks.  Hepatitis C blood test.  Hepatitis B blood test.  Sexually transmitted disease (STD) testing.  Diabetes screening. This is done by checking your blood sugar (glucose) after you have not eaten for a while (fasting). You may have this done every 1-3 years.  Discuss your test results, treatment options, and if necessary, the need for more tests with your health care provider. Vaccines Your health care provider may recommend certain vaccines, such as:  Influenza vaccine. This is recommended every year.  Tetanus, diphtheria, and acellular pertussis (Tdap, Td) vaccine. You may need a Td booster every 10 years.  Varicella vaccine. You may need this if you have not been vaccinated.  Zoster vaccine. You may need this after age 97.  Measles, mumps, and rubella (MMR) vaccine. You may need at least one dose of MMR if you were born in 1957 or later. You may also need a second dose.  Pneumococcal 13-valent conjugate (PCV13) vaccine. You may need this if you have certain conditions and have not been vaccinated.  Pneumococcal polysaccharide (PPSV23) vaccine. You may need one or two doses if you smoke cigarettes or if you have certain conditions.  Meningococcal vaccine. You may need this if you have certain conditions.  Hepatitis A vaccine. You may need this if you have certain conditions or if you travel or work in places where you may be exposed to  hepatitis A.  Hepatitis B vaccine. You may need this if you have certain conditions or if you travel or work in places where you may be exposed to hepatitis B.  Haemophilus influenzae type b (Hib) vaccine. You may need this if you have certain risk factors.  Talk to your health care provider about which screenings and vaccines you need and how often you need them. This information is not intended to replace advice given to you by your health care provider. Make sure you discuss any questions you have with your health care provider. Document Released: 05/12/2015 Document  Revised: 01/03/2016 Document Reviewed: 02/14/2015 Elsevier Interactive Patient Education  [employer].

## 2017-08-26 ENCOUNTER — Telehealth: Payer: Self-pay | Admitting: Family Medicine

## 2017-08-26 ENCOUNTER — Telehealth: Payer: Self-pay | Admitting: Cardiology

## 2017-08-26 DIAGNOSIS — D709 Neutropenia, unspecified: Secondary | ICD-10-CM | POA: Insufficient documentation

## 2017-08-26 LAB — HIV ANTIBODY (ROUTINE TESTING W REFLEX): HIV 1&2 Ab, 4th Generation: NONREACTIVE

## 2017-08-26 NOTE — Addendum Note (Signed)
Addended by: Abelino Derrick A on: 08/26/2017 11:59 AM   Modules accepted: Orders

## 2017-08-26 NOTE — Telephone Encounter (Signed)
Spoke with pt, he is working nights now and needs an early morning appointment. Telephone number to the CT department given to the patient for him to call and schedule at his convenience.[name]

## 2017-08-26 NOTE — Telephone Encounter (Signed)
ew Message:    Pt says he was supposed to have had a Ct of the Chest in November of [address] .He says he is ready to have it now.

## 2017-08-26 NOTE — Telephone Encounter (Signed)
TC back to patient to schedule 3 month follow-up. Left VM to phone to schedule follow up and lab.

## 2017-08-27 ENCOUNTER — Encounter: Payer: Self-pay | Admitting: Internal Medicine

## 2017-08-27 ENCOUNTER — Telehealth: Payer: Self-pay | Admitting: Internal Medicine

## 2017-08-27 NOTE — Telephone Encounter (Signed)
Appt has been scheduled for the pt to see Dr. Julien Nordmann on 5/14 at 1130am. Pt aware to arrive 30 minutes early. Letter mailed to the pt.

## 2017-09-09 ENCOUNTER — Telehealth: Payer: Self-pay | Admitting: Internal Medicine

## 2017-09-09 ENCOUNTER — Telehealth: Payer: Self-pay

## 2017-09-09 ENCOUNTER — Encounter: Payer: Self-pay | Admitting: Internal Medicine

## 2017-09-09 ENCOUNTER — Inpatient Hospital Stay: Payer: BLUE CROSS/BLUE SHIELD

## 2017-09-09 ENCOUNTER — Inpatient Hospital Stay: Payer: BLUE CROSS/BLUE SHIELD | Attending: Internal Medicine | Admitting: Internal Medicine

## 2017-09-09 VITALS — BP 128/86 | HR 84 | Temp 98.4°F | Resp 18 | Ht 70.5 in | Wt 164.3 lb

## 2017-09-09 DIAGNOSIS — D61818 Other pancytopenia: Secondary | ICD-10-CM | POA: Diagnosis not present

## 2017-09-09 DIAGNOSIS — D72819 Decreased white blood cell count, unspecified: Secondary | ICD-10-CM | POA: Insufficient documentation

## 2017-09-09 DIAGNOSIS — Z79899 Other long term (current) drug therapy: Secondary | ICD-10-CM | POA: Diagnosis not present

## 2017-09-09 LAB — CMP (CANCER CENTER ONLY)
ALT: 30 U/L (ref 0–55)
AST: 29 U/L (ref 5–34)
Albumin: 4.7 g/dL (ref 3.5–5.0)
Alkaline Phosphatase: 86 U/L (ref 40–150)
Anion gap: 8 (ref 3–11)
BUN: 9 mg/dL (ref 7–26)
CO2: 26 mmol/L (ref 22–29)
Calcium: 9.9 mg/dL (ref 8.4–10.4)
Chloride: 103 mmol/L (ref 98–109)
Creatinine: 1.04 mg/dL (ref 0.70–1.30)
GFR, Est AFR Am: >60 mL/min (ref >60)
GFR, Estimated: >60 mL/min (ref >60)
Glucose, Bld: 86 mg/dL (ref 70–140)
Potassium: 3.9 mmol/L (ref 3.5–5.1)
Sodium: 137 mmol/L (ref 136–145)
Total Bilirubin: 0.4 mg/dL (ref 0.2–1.2)
Total Protein: 8.4 g/dL — ABNORMAL HIGH (ref 6.4–8.3)

## 2017-09-09 LAB — CBC WITH DIFFERENTIAL (CANCER CENTER ONLY)
Basophils Absolute: 0 10*3/uL (ref 0.0–0.1)
Basophils Relative: 1 %
Eosinophils Absolute: 0 10*3/uL (ref 0.0–0.5)
Eosinophils Relative: 2 %
HCT: 43.6 % (ref 38.4–49.9)
Hemoglobin: 14.2 g/dL (ref 13.0–17.1)
Lymphocytes Relative: 33 %
Lymphs Abs: 0.8 10*3/uL — ABNORMAL LOW (ref 0.9–3.3)
MCH: 30 pg (ref 27.2–33.4)
MCHC: 32.6 g/dL (ref 32.0–36.0)
MCV: 92.1 fL (ref 79.3–98.0)
Monocytes Absolute: 0.2 10*3/uL (ref 0.1–0.9)
Monocytes Relative: 9 %
Neutro Abs: 1.2 10*3/uL — ABNORMAL LOW (ref 1.5–6.5)
Neutrophils Relative %: 55 %
Platelet Count: 207 10*3/uL (ref 140–400)
RBC: 4.73 MIL/uL (ref 4.20–5.82)
RDW: 14.7 % — ABNORMAL HIGH (ref 11.0–14.6)
WBC Count: 2.3 10*3/uL — ABNORMAL LOW (ref 4.0–10.3)

## 2017-09-09 LAB — VITAMIN B12: Vitamin B-12: 465 pg/mL (ref 180–914)

## 2017-09-09 LAB — IRON AND TIBC
Iron: 102 ug/dL (ref 42–163)
Saturation Ratios: 27 % — ABNORMAL LOW (ref 42–163)
TIBC: 373 ug/dL (ref 202–409)
UIBC: 271 ug/dL

## 2017-09-09 LAB — FERRITIN: Ferritin: 77 ng/mL (ref 22–316)

## 2017-09-09 LAB — LACTATE DEHYDROGENASE: LDH: 384 U/L — ABNORMAL HIGH (ref 125–245)

## 2017-09-09 NOTE — Telephone Encounter (Signed)
Scheduled appt per 5/14 los - Gave patient aVS and calender per los. 

## 2017-09-09 NOTE — Telephone Encounter (Signed)
Spoke with patient about bone marrow biopsy.  Ok per patient  to schedule 09/17/17 - 7:30am.

## 2017-09-09 NOTE — Progress Notes (Signed)
[place] [phone]   [phone]  CONSULT NOTE  REFERRING PHYSICIAN: [name], MD   REASON FOR CONSULTATION:  57 years old African-American male with persistent leukocytopenia.  HPI David [name] is a 57 y.o. male with past medical history significant for stage I prostate adenocarcinoma status post seed implants under the care of Dr. Tammi Klippel on November 2016.  The patient also has a history of hemolytic anemia secondary to endocarditis, C. difficile and arthritis.  He was seen recently by a new primary care physician for routine physical examination.  CBC performed on August 25, 2017 showed total white blood count of 2.2, HIV antibody was nonreactive.  Reviewing his previous records The patient had decreased white blood count for at least the last 2 years since 2016.  He has not started any new medications since that time except for metoprolol.  He denied taking over-the-counter medication.  The last normal white blood count was reported on 09/01/2014 and at that time it was 4.6.  The patient denied having any recurrent infection.  He denied having any fever or chills.  He denied having any bleeding, bruises or ecchymosis. On review of system he has no fever, chills, weight loss or night sweats.  He has no nausea, vomiting, diarrhea or constipation.  He denied having any chest pain, shortness of breath, cough or hemoptysis.  He has no headache or visual changes. Family history significant for father with glaucoma and Alzheimer, mother is healthy and sister had cervical cancer. The patient is married and has 2 biological children.  He works as a Programmer, applications.  He has a remote history for smoking for around 15 years but quit August 08, 1989.  He has no history of alcohol or drug abuse.  HPI  Past Medical History:  Diagnosis Date  . DDD (degenerative disc disease), lumbosacral   . Hemolytic anemia ([place]) 06/29/2014  .  History of bacterial endocarditis    07/ 2001  enterococcal//  recurrence  11/ 2009  steptococcus viridans  . History of chest tube placement    1980's --- R SIDE  SPONTANEOUS  PNEUMOTHORAX  . History of Clostridium difficile colitis    2009  . History of periodontal disease   . Prostate cancer [facility]) urologist--  dr herrick/  oncologist-- dr Tammi Klippel   Stage  T1c,  Gleason 3+4,  PSA 3.76,  vol 33.45cc  . Pulmonary nodules followed by dr Stanford Breed   right upper & lower lobe and left lower lobe--  stable per CT 08-29-2014  . S/P redo mitral valve replacement with metallic valve cardiologist--  dr Stanford Breed   08-11-2014--- 31 mm Sorin Carbomedics Optiform bileaflet mechanical prosthesis  . Thoracic radiculopathy    T7  (positional)  per PCP note--  pt doing yoga and swimming state has helpted  . Wears glasses   . Wears partial dentures    upper    Past Surgical History:  Procedure Laterality Date  . CARDIAC CATHETERIZATION  03-15-2008  dr Lia Foyer   no sig. coronary disease/  normal LVF/  moderate pulmonary hypertension w/ elevated V wave  . COLONOSCOPY  last one 12/ 2013  . ESOPHAGOGASTRODUODENOSCOPY N/A 06/02/2014   Procedure: ESOPHAGOGASTRODUODENOSCOPY (EGD);  Surgeon: Jerene Bears, MD;  Location: [facility] ENDOSCOPY;  Service: Endoscopy;  Laterality: N/A;  . LEFT AND RIGHT HEART CATHETERIZATION WITH CORONARY ANGIOGRAM N/A 08/10/2014   Procedure: LEFT AND RIGHT HEART CATHETERIZATION WITH CORONARY ANGIOGRAM;  Surgeon: Mallie Mussel  Nicholes Stairs, MD;  Location: [facility] CATH LAB;  Service: Cardiovascular;  Laterality: N/A;   EF 55%,  severe MR, severe pulm HTN,  40% LAD  . MITRAL VALVE REPAIR  03-22-2008   via  right mini thoracotomy  . MITRAL VALVE REPAIR N/A 08/11/2014   Procedure: REDO MITRAL VALVE (MV) REPLACEMENT;  Surgeon: Rexene Alberts, MD;  Location: [place];  Service: Open Heart Surgery;  Laterality: N/A;  NO NECK LINES ON RIGHT SIDE  . MULTIPLE TOOTH EXTRACTIONS  03-16-2008    w/ alveoloplasty  . PROSTATE  BIOPSY  03/2014  . RADIOACTIVE SEED IMPLANT N/A 03/17/2015   Procedure: RADIOACTIVE SEED IMPLANT/BRACHYTHERAPY IMPLANT;  Surgeon: Ardis Hughs, MD;  Location: [facility];  Service: Urology;  Laterality: N/A;  DR PORTABLE  . TEE WITHOUT CARDIOVERSION N/A 07/19/2014   Procedure: TRANSESOPHAGEAL ECHOCARDIOGRAM (TEE);  Surgeon: Lelon Perla, MD;  Location: [facility] ENDOSCOPY;  Service: Cardiovascular;  Laterality: N/A;  . TEE WITHOUT CARDIOVERSION N/A 08/11/2014   Procedure: TRANSESOPHAGEAL ECHOCARDIOGRAM (TEE);  Surgeon: Rexene Alberts, MD;  Location: [place];  Service: Open Heart Surgery;  Laterality: N/A;  . TRANSTHORACIC ECHOCARDIOGRAM  09-23-2014   dr  Stanford Breed   mild focal basal LVH of the septum,  ef 45-50%,  normal wall motion/  mechanical bileaflet MV sits well in mitrial position, no sig. regurg., valve area 1.61 cm^2,  mean grandient 62mHg/  mild LAE/  RA prominent Chiari malformation , unchanged from prior study, normal size/  mild TR/  mild to moderate pulmonic insuffiency    Family History  Problem Relation Age of Onset  . Ovarian cancer Sister   . Alzheimer's disease Father   . Diabetes Neg Hx   . Heart disease Neg Hx   . Stroke Neg Hx     Social History Social History   Tobacco Use  . Smoking status: Former Smoker    Packs/day: 2.00    Years: 15.00    Pack years: 30.00    Types: Cigarettes    Last attempt to quit: 08/08/1989    Years since quitting: 28.1  . Smokeless tobacco: Never Used  Substance Use Topics  . Alcohol use: No    Alcohol/week: 0.0 oz    Comment: "stopped drinking in 1991  . Drug use: No    Types: Cocaine, Heroin, Marijuana    Comment: "stopped using drugs in ~ 1984"    Allergies  Allergen Reactions  . Clarithromycin Nausea Only    Current Outpatient Medications  Medication Sig Dispense Refill  . aspirin EC 81 MG EC tablet Take 1 tablet (81 mg total) by mouth daily.    . metoprolol succinate (TOPROL-XL) 25 MG 24 hr tablet  TAKE 1 TABLET (25 MG TOTAL) DAILY BY MOUTH. 90 tablet 3  . Multiple Vitamin (MULTIVITAMIN) tablet Take 1 tablet by mouth daily.    .Marland Kitchenomega-3 acid ethyl esters (LOVAZA) 1 g capsule Take by mouth 2 (two) times daily.    .Marland KitchenOVER THE COUNTER MEDICATION 1 tablet.    .Marland KitchenOVER THE COUNTER MEDICATION Take 1 tablet by mouth daily.    . sildenafil (REVATIO) 20 MG tablet Take 20 mg by mouth 3 (three) times daily.    .Marland Kitchenwarfarin (COUMADIN) 5 MG tablet Take 1 to 1.5 tablets by mouth daily as directed 120 tablet 0  . loratadine (CLARITIN) 10 MG tablet Take 10 mg by mouth daily as needed for allergies.     No current facility-administered medications for this visit.  Review of Systems  Constitutional: negative Eyes: negative Ears, nose, mouth, throat, and face: negative Respiratory: negative Cardiovascular: negative Gastrointestinal: negative Genitourinary:negative Integument/breast: negative Hematologic/lymphatic: negative Musculoskeletal:negative Neurological: negative Behavioral/Psych: negative Endocrine: negative Allergic/Immunologic: negative  Physical Exam  FUX:NATFT, healthy, no distress, well nourished and well developed SKIN: skin color, texture, turgor are normal, no rashes or significant lesions HEAD: Normocephalic, No masses, lesions, tenderness or abnormalities EYES: normal, PERRLA, Conjunctiva are pink and non-injected EARS: External ears normal, Canals clear OROPHARYNX:no exudate, no erythema and lips, buccal mucosa, and tongue normal  NECK: supple, no adenopathy, no JVD LYMPH:  no palpable lymphadenopathy, no hepatosplenomegaly LUNGS: clear to auscultation , and palpation HEART: regular rate & rhythm, no murmurs and no gallops ABDOMEN:abdomen soft, non-tender, normal bowel sounds and no masses or organomegaly BACK: Back symmetric, no curvature., No CVA tenderness EXTREMITIES:no joint deformities, effusion, or inflammation, no edema  NEURO: alert & oriented x 3 with fluent  speech, no focal motor/sensory deficits  PERFORMANCE STATUS: ECOG 0  LABORATORY DATA: Lab Results  Component Value Date   WBC 2.2 Repeated and verified X2. (L) 08/25/2017   HGB 14.1 08/25/2017   HCT 42.9 08/25/2017   MCV 90.9 08/25/2017   PLT 188.0 08/25/2017      Chemistry      Component Value Date/Time   NA 141 08/25/2017 1028   NA 141 09/27/2014 1340   K 4.3 08/25/2017 1028   K 4.1 09/27/2014 1340   CL 104 08/25/2017 1028   CO2 34 (H) 08/25/2017 1028   CO2 25 09/27/2014 1340   BUN 13 08/25/2017 1028   BUN 10.7 09/27/2014 1340   CREATININE 0.82 08/25/2017 1028   CREATININE 1.0 09/27/2014 1340      Component Value Date/Time   CALCIUM 9.5 08/25/2017 1028   CALCIUM 9.0 09/27/2014 1340   ALKPHOS 58 08/25/2017 1028   ALKPHOS 76 09/27/2014 1340   AST 20 08/25/2017 1028   AST 25 09/27/2014 1340   ALT 20 08/25/2017 1028   ALT 19 09/27/2014 1340   BILITOT 0.5 08/25/2017 1028   BILITOT 0.40 09/27/2014 1340       RADIOGRAPHIC STUDIES: No results found.  ASSESSMENT: This is a very pleasant 57 years old African-American male with persistent leukocytopenia for at least the last 3 years.  The last time the patient has normal white blood count was on Sep 01, 2014.  Since that time the only new medication was metoprolol which in rare situation can cause agranulocytosis.  He also had seed implants for prostate cancer in November 2016.  The etiology of his leukocytopenia is unclear but this could be multifactorial.   PLAN: I had a lengthy discussion with the patient today about his condition and further investigation to confirm his diagnosis. I order several studies today including repeat CBC which showed persistent leukocytopenia of 2.3 with absolute neutrophil count of 1200. I also order comprehensive metabolic panel, LDH, ANA, hepatitis panel, rheumatoid factor, iron study and ferritin, folate, vitamin B12 level. I will also arrange for the patient to have a bone marrow biopsy  and aspirate to rule out any underlying bone marrow abnormalities. I would see the patient back for follow-up visit in 2 weeks for reevaluation and discussion of his lab and biopsy results as well as further recommendation regarding his condition. He is currently asymptomatic but he was advised to call immediately if he has any concerning symptoms in the interval. The patient voices understanding of current disease status and treatment options and is in  agreement with the current care plan.  All questions were answered. The patient knows to call the clinic with any problems, questions or concerns. We can certainly see the patient much sooner if necessary.  Thank you so much for allowing me to participate in the care of BADEN BETSCH. I will continue to follow up the patient with you and assist in his care.  I spent 40 minutes counseling the patient face to face. The total time spent in the appointment was 60 minutes.  Disclaimer: This note was dictated with voice recognition software. Similar sounding words can inadvertently be transcribed and may not be corrected upon review.   Eilleen Kempf Sep 09, 2017, 12:24 PM

## 2017-09-10 LAB — RHEUMATOID FACTOR: Rhuematoid fact SerPl-aCnc: <10 IU/mL (ref 0.0–13.9)

## 2017-09-10 LAB — FOLATE RBC
Folate, Hemolysate: 475.9 ng/mL
Folate, RBC: 1125 ng/mL (ref >498)
Hematocrit: 42.3 % (ref 37.5–51.0)

## 2017-09-10 LAB — ANTI-DNA ANTIBODY, DOUBLE-STRANDED: ds DNA Ab: 2 IU/mL (ref 0–9)

## 2017-09-11 ENCOUNTER — Ambulatory Visit (INDEPENDENT_AMBULATORY_CARE_PROVIDER_SITE_OTHER)
Admission: RE | Admit: 2017-09-11 | Discharge: 2017-09-11 | Disposition: A | Discharge status: Home / Self Care | Payer: BLUE CROSS/BLUE SHIELD | Source: Ambulatory Visit | Attending: Cardiology | Admitting: Cardiology

## 2017-09-11 DIAGNOSIS — R918 Other nonspecific abnormal finding of lung field: Secondary | ICD-10-CM | POA: Diagnosis not present

## 2017-09-11 LAB — HEPATITIS PANEL, ACUTE
HCV Ab: <0.1 s/co ratio (ref 0.0–0.9)
Hep A IgM: NEGATIVE
Hep B C IgM: NEGATIVE
Hepatitis B Surface Ag: NEGATIVE

## 2017-09-17 ENCOUNTER — Inpatient Hospital Stay: Payer: BLUE CROSS/BLUE SHIELD

## 2017-09-17 ENCOUNTER — Inpatient Hospital Stay (HOSPITAL_BASED_OUTPATIENT_CLINIC_OR_DEPARTMENT_OTHER): Payer: BLUE CROSS/BLUE SHIELD | Admitting: Adult Health

## 2017-09-17 ENCOUNTER — Telehealth: Payer: Self-pay | Admitting: Adult Health

## 2017-09-17 VITALS — BP 121/80 | HR 80 | Temp 97.8°F | Resp 18

## 2017-09-17 DIAGNOSIS — D61818 Other pancytopenia: Secondary | ICD-10-CM

## 2017-09-17 DIAGNOSIS — D72819 Decreased white blood cell count, unspecified: Secondary | ICD-10-CM

## 2017-09-17 LAB — CBC WITH DIFFERENTIAL (CANCER CENTER ONLY)
Basophils Absolute: 0 10*3/uL (ref 0.0–0.1)
Basophils Relative: 1 %
Eosinophils Absolute: 0.1 10*3/uL (ref 0.0–0.5)
Eosinophils Relative: 4 %
HCT: 40 % (ref 38.4–49.9)
Hemoglobin: 13 g/dL (ref 13.0–17.1)
Lymphocytes Relative: 37 %
Lymphs Abs: 0.8 10*3/uL — ABNORMAL LOW (ref 0.9–3.3)
MCH: 29.9 pg (ref 27.2–33.4)
MCHC: 32.6 g/dL (ref 32.0–36.0)
MCV: 91.8 fL (ref 79.3–98.0)
Monocytes Absolute: 0.4 10*3/uL (ref 0.1–0.9)
Monocytes Relative: 16 %
Neutro Abs: 1 10*3/uL — ABNORMAL LOW (ref 1.5–6.5)
Neutrophils Relative %: 42 %
Platelet Count: 197 10*3/uL (ref 140–400)
RBC: 4.36 MIL/uL (ref 4.20–5.82)
RDW: 14.5 % (ref 11.0–14.6)
WBC Count: 2.3 10*3/uL — ABNORMAL LOW (ref 4.0–10.3)

## 2017-09-17 NOTE — Progress Notes (Signed)
[place] PROGRESS NOTE  David [name] presents for Bone Marrow biopsy per MD orders. David [name] verbalized understanding of procedure. Consent reviewed and signed.  David [name] positioned supine for procedure. Time-out performed and Bone Marrow Checklist. Procedure began at 0815. Xylocaine 2% 10 cc used for local and administered to patient by NP Mendel Ryder. Procedure completed at 0830. Patient tolerated well. Pressure dressing applied to L hip with instructions to leave in place for 24 hours. Patient instructed to report any bleeding that saturates dressing and to take pain medication as directed. Dressing dry and intact to L hip on discharge. Pt stayed 30 min post BMB for observation, VS stable.  A&Ox4.  Ambulatory w/steady gait.  Verbalized understanding that he can call with any questions or concerns.

## 2017-09-17 NOTE — Progress Notes (Signed)
INDICATION: pancytopenia, rule out MDS  Patient held Coumadin this morning.  Bone Marrow Biopsy and Aspiration Procedure Note   Informed consent was obtained and potential risks including bleeding, infection and pain were reviewed with the patient.  The patient's name, date of birth, identification, consent and allergies were verified prior to the start of procedure and time out was performed.  The left posterior iliac crest was chosen as the site of biopsy.  The skin was prepped with ChloraPrep.   5 cc of 2% lidocaine was used to provide local anaesthesia.   8 cc of bone marrow aspirate was obtained followed by 1cm biopsy.  Pressure was applied to the biopsy site and bandage was placed over the biopsy site. Patient was made to lie on the back for 30 mins prior to discharge.  The procedure was tolerated well. COMPLICATIONS: None BLOOD LOSS: none The patient was discharged home in stable condition with a 1 week follow up to review results.  Patient was provided with post bone marrow biopsy instructions and instructed to call if there was any bleeding or worsening pain. He can resume Coumadin tomorrow.    Specimens sent for flow cytometry, cytogenetics and FISH.  Signed Scot Dock, NP

## 2017-09-17 NOTE — Patient Instructions (Signed)
Bone Marrow Aspiration and Bone Marrow Biopsy, Adult, Care After This sheet gives you information about how to care for yourself after your procedure. Your health care provider may also give you more specific instructions. If you have problems or questions, contact your health care provider. What can I expect after the procedure? After the procedure, it is common to have:  Mild pain and tenderness.  Swelling.  Bruising.  Follow these instructions at home:  Take over-the-counter or prescription medicines only as told by your health care provider.  Do not take baths, swim, or use a hot tub until your health care provider approves. Ask if you can take a shower or have a sponge bath.  Follow instructions from your health care provider about how to take care of the puncture site. Make sure you: ? Wash your hands with soap and water before you change your bandage (dressing). If soap and water are not available, use hand sanitizer. ? Change your dressing as told by your health care provider.  Check your puncture siteevery day for signs of infection. Check for: ? More redness, swelling, or pain. ? More fluid or blood. ? Warmth. ? Pus or a bad smell.  Return to your normal activities as told by your health care provider. Ask your health care provider what activities are safe for you.  Do not drive for 24 hours if you were given a medicine to help you relax (sedative).  Keep all follow-up visits as told by your health care provider. This is important. Contact a health care provider if:  You have more redness, swelling, or pain around the puncture site.  You have more fluid or blood coming from the puncture site.  Your puncture site feels warm to the touch.  You have pus or a bad smell coming from the puncture site.  You have a fever.  Your pain is not controlled with medicine. This information is not intended to replace advice given to you by your health care provider. Make sure  you discuss any questions you have with your health care provider. Document Released: 11/02/2004 Document Revised: 11/03/2015 Document Reviewed: 09/27/2015 Elsevier Interactive Patient Education  2018 [employer].

## 2017-09-17 NOTE — Telephone Encounter (Signed)
Per 5/22 no los

## 2017-09-24 ENCOUNTER — Encounter: Payer: Self-pay | Admitting: Internal Medicine

## 2017-09-24 ENCOUNTER — Inpatient Hospital Stay (HOSPITAL_BASED_OUTPATIENT_CLINIC_OR_DEPARTMENT_OTHER): Payer: BLUE CROSS/BLUE SHIELD | Admitting: Internal Medicine

## 2017-09-24 ENCOUNTER — Telehealth: Payer: Self-pay | Admitting: Internal Medicine

## 2017-09-24 VITALS — BP 124/82 | HR 88 | Temp 98.8°F | Resp 18 | Ht 70.5 in | Wt 164.7 lb

## 2017-09-24 DIAGNOSIS — D702 Other drug-induced agranulocytosis: Secondary | ICD-10-CM

## 2017-09-24 DIAGNOSIS — D72819 Decreased white blood cell count, unspecified: Secondary | ICD-10-CM

## 2017-09-24 NOTE — Telephone Encounter (Signed)
Scheduled apt per 5/29 los - pt aware - did not want avs or calender printed. - my chart active

## 2017-09-24 NOTE — Progress Notes (Signed)
[facility] [phone]   [phone]  OFFICE PROGRESS NOTE  [name], MD [place] [address]  DIAGNOSIS: Leukocytopenia of unclear etiology.  PRIOR THERAPY: None  CURRENT THERAPY: None  INTERVAL HISTORY: David [name] 57 y.o. male returns to the clinic today for follow-up visit.  The patient is feeling fine today with no specific complaints.  He is recovering from cold symptoms.  He denied having any recent chest pain, shortness of breath, cough or hemoptysis.  He denied having any nausea, vomiting, diarrhea or constipation.  He has no recent weight loss or night sweats.  He underwent several studies for evaluation of his leukocytopenia including repeat CBC, iron study, ferritin, serum folate, vitamin B12, hepatitis panel, HIV, ANA and rheumatoid factor as well as bone marrow biopsy and aspirate.  The patient is here today for reevaluation and discussion of his lab and biopsy results.  MEDICAL HISTORY: Past Medical History:  Diagnosis Date  . DDD (degenerative disc disease), lumbosacral   . Hemolytic anemia ([place]) 06/29/2014  . History of bacterial endocarditis    07/ 2001  enterococcal//  recurrence  11/ 2009  steptococcus viridans  . History of chest tube placement    1980's --- R SIDE  SPONTANEOUS  PNEUMOTHORAX  . History of Clostridium difficile colitis    2009  . History of periodontal disease   . 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 Pulmonary nodules followed by dr Stanford Breed   right upper & lower lobe and left lower lobe--  stable per CT 08-29-2014  . S/P redo mitral valve replacement with metallic valve cardiologist--  dr Stanford Breed   08-11-2014--- 31 mm Sorin Carbomedics Optiform bileaflet mechanical prosthesis  . Thoracic radiculopathy    T7  (positional)  per PCP note--  pt doing yoga and swimming state has  helpted  . Wears glasses   . Wears partial dentures    upper    ALLERGIES:  is allergic to clarithromycin.  MEDICATIONS:  Current Outpatient Medications  Medication Sig Dispense Refill  . aspirin EC 81 MG EC tablet Take 1 tablet (81 mg total) by mouth daily.    [name] loratadine (CLARITIN) 10 MG tablet Take 10 mg by mouth daily as needed for allergies.    . metoprolol succinate (TOPROL-XL) 25 MG 24 hr tablet TAKE 1 TABLET (25 MG TOTAL) DAILY BY MOUTH. 90 tablet 3  . Multiple Vitamin (MULTIVITAMIN) tablet Take 1 tablet by mouth daily.    [name] omega-3 acid ethyl esters (LOVAZA) 1 g capsule Take by mouth 2 (two) times daily.    [name] OVER THE COUNTER MEDICATION 1 tablet.    . warfarin (COUMADIN) 5 MG tablet Take 1 to 1.5 tablets by mouth daily as directed 120 tablet 0   No current facility-administered medications for this visit.     SURGICAL HISTORY:  Past Surgical History:  Procedure Laterality Date  . CARDIAC CATHETERIZATION  03-15-2008  dr Lia Foyer   no sig. coronary disease/  normal LVF/  moderate pulmonary hypertension w/ elevated V wave  . COLONOSCOPY  last one 12/ 2013  . ESOPHAGOGASTRODUODENOSCOPY N/A 06/02/2014   Procedure: ESOPHAGOGASTRODUODENOSCOPY (EGD);  Surgeon: Jerene Bears, MD;  Location: [facility] ENDOSCOPY;  Service: Endoscopy;  Laterality: N/A;  . LEFT AND RIGHT HEART CATHETERIZATION WITH CORONARY ANGIOGRAM N/A 08/10/2014   Procedure: LEFT  AND RIGHT HEART CATHETERIZATION WITH CORONARY ANGIOGRAM;  Surgeon: Belva Crome, MD;  Location: [facility] CATH LAB;  Service: Cardiovascular;  Laterality: N/A;   EF 55%,  severe MR, severe pulm HTN,  40% LAD  . MITRAL VALVE REPAIR  03-22-2008   via  right mini thoracotomy  . MITRAL VALVE REPAIR N/A 08/11/2014   Procedure: REDO MITRAL VALVE (MV) REPLACEMENT;  Surgeon: Rexene Alberts, MD;  Location: [facility];  Service: Open Heart Surgery;  Laterality: N/A;  NO NECK LINES ON RIGHT SIDE  . MULTIPLE TOOTH EXTRACTIONS  03-16-2008    w/ alveoloplasty  . PROSTATE  BIOPSY  03/2014  . RADIOACTIVE SEED IMPLANT N/A 03/17/2015   Procedure: RADIOACTIVE SEED IMPLANT/BRACHYTHERAPY IMPLANT;  Surgeon: Ardis Hughs, MD;  Location: [facility];  Service: Urology;  Laterality: N/A;  DR PORTABLE  . TEE WITHOUT CARDIOVERSION N/A 07/19/2014   Procedure: TRANSESOPHAGEAL ECHOCARDIOGRAM (TEE);  Surgeon: Lelon Perla, MD;  Location: [facility] ENDOSCOPY;  Service: Cardiovascular;  Laterality: N/A;  . TEE WITHOUT CARDIOVERSION N/A 08/11/2014   Procedure: TRANSESOPHAGEAL ECHOCARDIOGRAM (TEE);  Surgeon: Rexene Alberts, MD;  Location: [facility];  Service: Open Heart Surgery;  Laterality: N/A;  . TRANSTHORACIC ECHOCARDIOGRAM  09-23-2014   dr  Stanford Breed   mild focal basal LVH of the septum,  ef 45-50%,  normal wall motion/  mechanical bileaflet MV sits well in mitrial position, no sig. regurg., valve area 1.61 cm^2,  mean grandient 51mHg/  mild LAE/  RA prominent Chiari malformation , unchanged from prior study, normal size/  mild TR/  mild to moderate pulmonic insuffiency    REVIEW OF SYSTEMS:  A comprehensive review of systems was negative.   PHYSICAL EXAMINATION: General appearance: alert, cooperative and no distress Head: Normocephalic, without obvious abnormality, atraumatic Neck: no adenopathy, no JVD, supple, symmetrical, trachea midline and thyroid not enlarged, symmetric, no tenderness/mass/nodules Lymph nodes: Cervical, supraclavicular, and axillary nodes normal. Resp: clear to auscultation bilaterally Back: symmetric, no curvature. ROM normal. No CVA tenderness. Cardio: regular rate and rhythm, S1, S2 normal, no murmur, click, rub or gallop GI: soft, non-tender; bowel sounds normal; no masses,  no organomegaly Extremities: extremities normal, atraumatic, no cyanosis or edema  ECOG PERFORMANCE STATUS: 1 - Symptomatic but completely ambulatory  Blood pressure 124/82, pulse 88, temperature 98.8 F (37.1 C), temperature source Oral, resp. rate 18, height 5'  10.5" (1.791 m), weight 164 lb 11.2 oz (74.7 kg), SpO2 100 %.  LABORATORY DATA: Lab Results  Component Value Date   WBC 2.3 (L) 09/17/2017   HGB 13.0 09/17/2017   HCT 40.0 09/17/2017   MCV 91.8 09/17/2017   PLT 197 09/17/2017      Chemistry      Component Value Date/Time   NA 137 09/09/2017 1256   NA 141 09/27/2014 1340   K 3.9 09/09/2017 1256   K 4.1 09/27/2014 1340   CL 103 09/09/2017 1256   CO2 26 09/09/2017 1256   CO2 25 09/27/2014 1340   BUN 9 09/09/2017 1256   BUN 10.7 09/27/2014 1340   CREATININE 1.04 09/09/2017 1256   CREATININE 1.0 09/27/2014 1340      Component Value Date/Time   CALCIUM 9.9 09/09/2017 1256   CALCIUM 9.0 09/27/2014 1340   ALKPHOS 86 09/09/2017 1256   ALKPHOS 76 09/27/2014 1340   AST 29 09/09/2017 1256   AST 25 09/27/2014 1340   ALT 30 09/09/2017 1256   ALT 19 09/27/2014 1340   BILITOT 0.4 09/09/2017 1256  BILITOT 0.40 09/27/2014 1340       RADIOGRAPHIC STUDIES: Ct Chest Wo Contrast  Result Date: 09/11/2017 CLINICAL DATA:  Follow-up indeterminate pulmonary nodules. EXAM: CT CHEST WITHOUT CONTRAST TECHNIQUE: Multidetector CT imaging of the chest was performed following the standard protocol without IV contrast. COMPARISON:  03/16/2015 and 06/22/2014 FINDINGS: Cardiovascular: No acute findings. Aortic atherosclerosis. Previous mitral valve replacement. Mediastinum/Nodes: No masses or pathologically enlarged lymph nodes identified on this unenhanced exam. Lungs/Pleura: Pleural-parenchymal scarring is again seen. 9 mm pulmonary nodule in the anterior right middle lobe on image 88/3 remains stable. A 4 mm nodule in the left lung apex on image 24/3 is also stable. These are consistent with benign postinflammatory etiology. An 8 mm ground-glass nodule is seen in the anterior left upper lobe on image 41/3 and shows no significant change. No new or enlarging pulmonary nodules or masses identified. No evidence of pulmonary infiltrate or pleural effusion.  Upper Abdomen:  Unremarkable. Musculoskeletal:  No suspicious bone lesions. IMPRESSION: Stable sub-cm solid pulmonary nodules, consistent with benign etiology. Persistent but stable 8 mm ground-glass nodule in anterior left upper lobe. Low-grade adenocarcinoma cannot be excluded. Recommend continued follow-up by chest CT in 2 years. This recommendation follows the consensus statement: Guidelines for Management of Small Pulmonary Nodules Detected on CT Images: From the Fleischner Society 2017; Radiology 2017; 284:228-243. Aortic Atherosclerosis (ICD10-I70.0). Electronically Signed   By: Earle Gell M.D.   On: 09/11/2017 11:56    ASSESSMENT AND PLAN: This is a very pleasant 57 years old African-American male with persistent leukocytopenia of unclear etiology. The patient underwent extensive studies including lab works that were unremarkable.  He also has a bone marrow biopsy and aspirate that showed no concerning abnormalities. He has been taking over-the-counter androgen supplements.  I strongly recommend for the patient to discontinue any over-the-counter supplements for now. He is currently asymptomatic.  I will see him back for follow-up visit in 3 months for evaluation with repeat CBC. The patient was advised to call immediately if he has any concerning symptoms in the interval. The patient voices understanding of current disease status and treatment options and is in agreement with the current care plan.  All questions were answered. The patient knows to call the clinic with any problems, questions or concerns. We can certainly see the patient much sooner if necessary.  I spent 10 minutes counseling the patient face to face. The total time spent in the appointment was 15 minutes.  Disclaimer: This note was dictated with voice recognition software. Similar sounding words can inadvertently be transcribed and may not be corrected upon review.

## 2017-10-02 LAB — TISSUE HYBRIDIZATION (BONE MARROW)-NCBH

## 2017-10-02 LAB — CHROMOSOME ANALYSIS, BONE MARROW

## 2017-10-15 ENCOUNTER — Other Ambulatory Visit: Payer: Self-pay | Admitting: Cardiology

## 2017-11-28 ENCOUNTER — Ambulatory Visit (INDEPENDENT_AMBULATORY_CARE_PROVIDER_SITE_OTHER): Payer: BLUE CROSS/BLUE SHIELD | Admitting: Pharmacist

## 2017-11-28 DIAGNOSIS — I059 Rheumatic mitral valve disease, unspecified: Secondary | ICD-10-CM | POA: Diagnosis not present

## 2017-11-28 DIAGNOSIS — Z954 Presence of other heart-valve replacement: Secondary | ICD-10-CM | POA: Diagnosis not present

## 2017-11-28 DIAGNOSIS — Z7901 Long term (current) use of anticoagulants: Secondary | ICD-10-CM | POA: Diagnosis not present

## 2017-11-28 LAB — POCT INR: INR: 3.5 — AB (ref 2.0–3.0)

## 2017-12-05 ENCOUNTER — Telehealth: Payer: Self-pay | Admitting: Internal Medicine

## 2017-12-05 NOTE — Telephone Encounter (Signed)
MM PAL - moved 8/29 lab/fu to 9/19. Left message for patient and mailed scheduled. Second message left confirming appointment moved from 8/29 to 9/19.

## 2017-12-25 ENCOUNTER — Ambulatory Visit: Payer: BLUE CROSS/BLUE SHIELD | Admitting: Internal Medicine

## 2017-12-25 ENCOUNTER — Other Ambulatory Visit: Payer: BLUE CROSS/BLUE SHIELD

## 2018-01-02 ENCOUNTER — Other Ambulatory Visit: Payer: Self-pay | Admitting: Cardiology

## 2018-01-15 ENCOUNTER — Inpatient Hospital Stay: Payer: BLUE CROSS/BLUE SHIELD | Admitting: Internal Medicine

## 2018-01-15 ENCOUNTER — Inpatient Hospital Stay: Payer: BLUE CROSS/BLUE SHIELD | Attending: Internal Medicine

## 2018-02-03 ENCOUNTER — Telehealth: Payer: Self-pay | Admitting: Pharmacist Clinician (PhC)/ Clinical Pharmacy Specialist

## 2018-02-03 NOTE — Telephone Encounter (Signed)
Patient on warfarin, last INR August.  LMOM for patient to return call

## 2018-02-05 NOTE — Telephone Encounter (Signed)
LMOM for patient to call and schedule INR check

## 2018-02-06 ENCOUNTER — Other Ambulatory Visit: Payer: Self-pay | Admitting: Cardiology

## 2018-02-10 NOTE — Telephone Encounter (Signed)
LMOM; patient to call back and schedule f/u INR check ASAP

## 2018-02-18 ENCOUNTER — Ambulatory Visit (INDEPENDENT_AMBULATORY_CARE_PROVIDER_SITE_OTHER): Payer: BLUE CROSS/BLUE SHIELD | Admitting: Pharmacist Clinician (PhC)/ Clinical Pharmacy Specialist

## 2018-02-18 DIAGNOSIS — Z7901 Long term (current) use of anticoagulants: Secondary | ICD-10-CM | POA: Diagnosis not present

## 2018-02-18 DIAGNOSIS — Z954 Presence of other heart-valve replacement: Secondary | ICD-10-CM | POA: Diagnosis not present

## 2018-02-18 DIAGNOSIS — I059 Rheumatic mitral valve disease, unspecified: Secondary | ICD-10-CM | POA: Diagnosis not present

## 2018-02-18 LAB — POCT INR: INR: 2.5 (ref 2.0–3.0)

## 2018-03-05 ENCOUNTER — Other Ambulatory Visit: Payer: Self-pay | Admitting: Cardiology

## 2018-03-11 ENCOUNTER — Ambulatory Visit: Payer: BLUE CROSS/BLUE SHIELD | Admitting: Family Medicine

## 2018-03-11 ENCOUNTER — Encounter: Payer: Self-pay | Admitting: Family Medicine

## 2018-03-11 VITALS — BP 122/70 | HR 60 | Ht 70.5 in | Wt 171.2 lb

## 2018-03-11 DIAGNOSIS — L720 Epidermal cyst: Secondary | ICD-10-CM

## 2018-03-11 MED ORDER — CEPHALEXIN 500 MG PO CAPS: 500.0000 mg | ORAL_CAPSULE | Freq: Three times a day (TID) | ORAL | 0 refills | Status: AC

## 2018-03-11 MED ORDER — TRAMADOL HCL 50 MG PO TABS: 50.0000 mg | ORAL_TABLET | Freq: Four times a day (QID) | ORAL | 0 refills | Status: DC | PRN

## 2018-03-11 NOTE — Progress Notes (Signed)
Subjective:  Patient ID: David [name], male    DOB: 1960-07-31  Age: 57 y.o. MRN: [id number]  CC: cyst by jaw   HPI RASHAAN WYLES presents for I & D of inflammed inclusion cyst.  It is become swollen and tender over the last several days.  He was able to partially drain it himself but then it quickly reformed.  Explained to patient that options for treating would be to give him an antibiotic and have him apply warm compresses.  Explained that the cyst would likely rupture and drain on its own.  Another option would be to drain it today.  Patient did not want to open on its own and he requests that I drain it today.  Outpatient Medications Prior to Visit  Medication Sig Dispense Refill  . aspirin EC 81 MG EC tablet Take 1 tablet (81 mg total) by mouth daily.    [name] loratadine (CLARITIN) 10 MG tablet Take 10 mg by mouth daily as needed for allergies.    . metoprolol succinate (TOPROL-XL) 25 MG 24 hr tablet TAKE 1 TABLET (25 MG TOTAL) DAILY BY MOUTH. 90 tablet 3  . Multiple Vitamin (MULTIVITAMIN) tablet Take 1 tablet by mouth daily.    [name] omega-3 acid ethyl esters (LOVAZA) 1 g capsule Take by mouth 2 (two) times daily.    [name] OVER THE COUNTER MEDICATION 1 tablet.    . warfarin (COUMADIN) 5 MG tablet Take 1 to 1.5 tablets by mouth daily, need INR for further refills 15 tablet 0  . warfarin (COUMADIN) 5 MG tablet Take 1 tablet by mouth daily or as directed by coumadin clinic 40 tablet 1   No facility-administered medications prior to visit.     ROS Review of Systems  Constitutional: Negative.   Respiratory: Negative.   Cardiovascular: Negative.   Gastrointestinal: Negative.   Skin: Positive for color change. Negative for wound.    Objective:  BP 122/70 (BP Location: Left Arm, Patient Position: Sitting, Cuff Size: Normal)   Pulse 60   Ht 5' 10.5" (1.791 m)   Wt 171 lb 4 oz (77.7 kg)   SpO2 100%   BMI 24.22 kg/m   BP Readings from Last 3 Encounters:  03/11/18 122/70  09/24/17  124/82  09/17/17 121/80    Wt Readings from Last 3 Encounters:  03/11/18 171 lb 4 oz (77.7 kg)  09/24/17 164 lb 11.2 oz (74.7 kg)  09/09/17 164 lb 4.8 oz (74.5 kg)    Physical Exam  Constitutional: He is oriented to person, place, and time. He appears well-developed and well-nourished. No distress.  HENT:  Head: Normocephalic and atraumatic.  Right Ear: External ear normal.  Left Ear: External ear normal.  Eyes: Right eye exhibits no discharge. Left eye exhibits no discharge. No scleral icterus.  Neck: No JVD present. No tracheal deviation present.  Pulmonary/Chest: Effort normal.  Neurological: He is alert and oriented to person, place, and time.  Skin: He is not diaphoretic.     Psychiatric: He has a normal mood and affect. His behavior is normal.     Incision and Drainage Procedure Note  Pre-operative Diagnosis: inflammed inclusion cyst  Post-operative Diagnosis: same  Indications: swollen and painful  Anesthesia: 1% lidocaine with epinephrine  Procedure Details  The procedure, risks and complications have been discussed in detail (including, but not limited to airway compromise, infection, bleeding) with the patient, and the patient has signed consent to the procedure.  The skin was sterilely prepped and draped  over the affected area in the usual fashion. After adequate local anesthesia, I&D with a #11 blade was performed on the left side of face. Purulent drainage: present. Cyst wall was inflamed and adhered to cavity. It was partially removed in pieces. Wound was packed with 1/4 iodoform gauze.  The patient was observed until stable.  Findings: Inclusion cyst  EBL: 1 cc's  Drains: 1/4 iodoform.   Condition: Tolerated procedure well   Complications: none.    Lab Results  Component Value Date   WBC 2.3 (L) 09/17/2017   HGB 13.0 09/17/2017   HCT 40.0 09/17/2017   PLT 197 09/17/2017   GLUCOSE 86 09/09/2017   CHOL 225 (H) 08/25/2017   TRIG 76.0  08/25/2017   HDL 64.10 08/25/2017   LDLDIRECT 132.4 11/19/2011   LDLCALC 146 (H) 08/25/2017   ALT 30 09/09/2017   AST 29 09/09/2017   NA 137 09/09/2017   K 3.9 09/09/2017   CL 103 09/09/2017   CREATININE 1.04 09/09/2017   BUN 9 09/09/2017   CO2 26 09/09/2017   TSH 0.81 08/25/2017   PSA 2.51 11/19/2011   INR 2.5 02/18/2018   HGBA1C 5.6 03/11/2016    Ct Chest Wo Contrast  Result Date: 09/11/2017 CLINICAL DATA:  Follow-up indeterminate pulmonary nodules. EXAM: CT CHEST WITHOUT CONTRAST TECHNIQUE: Multidetector CT imaging of the chest was performed following the standard protocol without IV contrast. COMPARISON:  03/16/2015 and 06/22/2014 FINDINGS: Cardiovascular: No acute findings. Aortic atherosclerosis. Previous mitral valve replacement. Mediastinum/Nodes: No masses or pathologically enlarged lymph nodes identified on this unenhanced exam. Lungs/Pleura: Pleural-parenchymal scarring is again seen. 9 mm pulmonary nodule in the anterior right middle lobe on image 88/3 remains stable. A 4 mm nodule in the left lung apex on image 24/3 is also stable. These are consistent with benign postinflammatory etiology. An 8 mm ground-glass nodule is seen in the anterior left upper lobe on image 41/3 and shows no significant change. No new or enlarging pulmonary nodules or masses identified. No evidence of pulmonary infiltrate or pleural effusion. Upper Abdomen:  Unremarkable. Musculoskeletal:  No suspicious bone lesions. IMPRESSION: Stable sub-cm solid pulmonary nodules, consistent with benign etiology. Persistent but stable 8 mm ground-glass nodule in anterior left upper lobe. Low-grade adenocarcinoma cannot be excluded. Recommend continued follow-up by chest CT in 2 years. This recommendation follows the consensus statement: Guidelines for Management of Small Pulmonary Nodules Detected on CT Images: From the Fleischner Society 2017; Radiology 2017; 284:228-243. Aortic Atherosclerosis (ICD10-I70.0).  Electronically Signed   By: Earle Gell M.D.   On: 09/11/2017 11:56    Assessment & Plan:   Braiden was seen today for cyst by jaw.  Diagnoses and all orders for this visit:  Epidermal inclusion cyst -     traMADol (ULTRAM) 50 MG tablet; Take 1 tablet (50 mg total) by mouth every 6 (six) hours as needed. -     cephALEXin (KEFLEX) 500 MG capsule; Take 1 capsule (500 mg total) by mouth 3 (three) times daily for 10 days.   I am having Mustaf Antonacci. Frix start on traMADol and cephALEXin. I am also having him maintain his aspirin, loratadine, metoprolol succinate, OVER THE COUNTER MEDICATION, omega-3 acid ethyl esters, multivitamin, warfarin, and warfarin.  Meds ordered this encounter  Medications  . traMADol (ULTRAM) 50 MG tablet    Sig: Take 1 tablet (50 mg total) by mouth every 6 (six) hours as needed.    Dispense:  30 tablet    Refill:  0  . cephALEXin (  KEFLEX) 500 MG capsule    Sig: Take 1 capsule (500 mg total) by mouth 3 (three) times daily for 10 days.    Dispense:  30 capsule    Refill:  0   Patient will return on Friday and I will repack it.  Follow-up: Return in 2 days (on 03/13/2018).  [name], MD

## 2018-03-11 NOTE — Patient Instructions (Signed)
Epidermal Cyst Removal, Care After Refer to this sheet in the next few weeks. These instructions provide you with information about caring for yourself after your procedure. Your health care provider may also give you more specific instructions. Your treatment has been planned according to current medical practices, but problems sometimes occur. Call your health care provider if you have any problems or questions after your procedure. What can I expect after the procedure? After the procedure, it is common to have:  Soreness in the area where your cyst was removed.  Tightness or itching from your skin sutures.  Follow these instructions at home:  Take medicines only as directed by your health care provider.  If you were prescribed an antibiotic medicine, finish all of it even if you start to feel better.  Use antibiotic ointment as directed by your health care provider. Follow the instructions carefully.  There are many different ways to close and cover an incision, including stitches (sutures), skin glue, and adhesive strips. Follow your health care provider's instructions about: ? Incision care. ? Bandage (dressing) changes and removal. ? Incision closure removal.  Keep the bandage (dressing) dry until your health care provider says that it can be removed. Take sponge baths only. Ask your health care provider when you can start showering or taking a bath.  After your dressing is off, check your incision every day for signs of infection. Watch for: ? Redness, swelling, or pain. ? Fluid, blood, or pus.  You can return to your normal activities. Do not do anything that stretches or puts pressure on your incision.  You can return to your normal diet.  Keep all follow-up visits as directed by your health care provider. This is important. Contact a health care provider if:  You have a fever.  Your incision bleeds.  You have redness, swelling, or pain in the incision area.  You  have fluid, blood, or pus coming from your incision.  Your cyst comes back after surgery. This information is not intended to replace advice given to you by your health care provider. Make sure you discuss any questions you have with your health care provider. Document Released: 05/06/2014 Document Revised: 09/21/2015 Document Reviewed: 12/29/2013 Elsevier Interactive Patient Education  2018 [employer]  

## 2018-03-13 ENCOUNTER — Encounter: Payer: Self-pay | Admitting: Family Medicine

## 2018-03-13 ENCOUNTER — Ambulatory Visit: Payer: BLUE CROSS/BLUE SHIELD | Admitting: Family Medicine

## 2018-03-13 VITALS — BP 122/68 | HR 66 | Temp 98.5°F | Ht 70.5 in | Wt 177.0 lb

## 2018-03-13 DIAGNOSIS — L720 Epidermal cyst: Secondary | ICD-10-CM

## 2018-03-13 MED ORDER — RANITIDINE HCL 75 MG PO TABS: 75.0000 mg | ORAL_TABLET | Freq: Two times a day (BID) | ORAL | 3 refills | Status: DC

## 2018-03-13 MED ORDER — LORATADINE 10 MG PO TABS: 10.0000 mg | ORAL_TABLET | Freq: Every day | ORAL | 2 refills | Status: DC | PRN

## 2018-03-13 NOTE — Progress Notes (Signed)
Subjective:  Patient ID: David [name], male    DOB: 05/04/1960  Age: 57 y.o. MRN: [id number]  CC: Follow-up   HPI NASIR BRIGHT presents for follow-up of I&D of infected inclusion cyst status post packing.  Wound is done well.  There is been moderate drainage.  Denies fevers or headaches.  Outpatient Medications Prior to Visit  Medication Sig Dispense Refill  . aspirin EC 81 MG EC tablet Take 1 tablet (81 mg total) by mouth daily.    . cephALEXin (KEFLEX) 500 MG capsule Take 1 capsule (500 mg total) by mouth 3 (three) times daily for 10 days. 30 capsule 0  . loratadine (CLARITIN) 10 MG tablet Take 10 mg by mouth daily as needed for allergies.    . metoprolol succinate (TOPROL-XL) 25 MG 24 hr tablet TAKE 1 TABLET (25 MG TOTAL) DAILY BY MOUTH. 90 tablet 3  . Multiple Vitamin (MULTIVITAMIN) tablet Take 1 tablet by mouth daily.    [name] omega-3 acid ethyl esters (LOVAZA) 1 g capsule Take by mouth 2 (two) times daily.    [name] OVER THE COUNTER MEDICATION 1 tablet.    . traMADol (ULTRAM) 50 MG tablet Take 1 tablet (50 mg total) by mouth every 6 (six) hours as needed. 30 tablet 0  . warfarin (COUMADIN) 5 MG tablet Take 1 to 1.5 tablets by mouth daily, need INR for further refills 15 tablet 0  . warfarin (COUMADIN) 5 MG tablet Take 1 tablet by mouth daily or as directed by coumadin clinic 40 tablet 1   No facility-administered medications prior to visit.     ROS Review of Systems  Constitutional: Negative.   Skin: Positive for wound. Negative for color change, pallor and rash.    Objective:  BP 122/68   Pulse 66   Temp 98.5 F (36.9 C) (Oral)   Ht 5' 10.5" (1.791 m)   Wt 177 lb (80.3 kg)   SpO2 98%   BMI 25.04 kg/m   BP Readings from Last 3 Encounters:  03/13/18 122/68  03/11/18 122/70  09/24/17 124/82    Wt Readings from Last 3 Encounters:  03/13/18 177 lb (80.3 kg)  03/11/18 171 lb 4 oz (77.7 kg)  09/24/17 164 lb 11.2 oz (74.7 kg)    Physical Exam  Constitutional: He  is oriented to person, place, and time. He appears well-developed and well-nourished. No distress.  HENT:  Head: Normocephalic.  Right Ear: External ear normal.  Left Ear: External ear normal.  Eyes: Right eye exhibits no discharge. Left eye exhibits no discharge. No scleral icterus.  Pulmonary/Chest: Effort normal.  Neurological: He is alert and oriented to person, place, and time.  Skin: He is not diaphoretic.     Psychiatric: He has a normal mood and affect. His behavior is normal.   Procedure note: Tape was removed.  Multiple pieces of cyst wall with bloody serosanguineous discharge were expressed.  There was no pus.  Wound cavity was injected with 2 cc of 2% lidocaine without.  Wound was then repacked with quarter inch tape. Lab Results  Component Value Date   WBC 2.3 (L) 09/17/2017   HGB 13.0 09/17/2017   HCT 40.0 09/17/2017   PLT 197 09/17/2017   GLUCOSE 86 09/09/2017   CHOL 225 (H) 08/25/2017   TRIG 76.0 08/25/2017   HDL 64.10 08/25/2017   LDLDIRECT 132.4 11/19/2011   LDLCALC 146 (H) 08/25/2017   ALT 30 09/09/2017   AST 29 09/09/2017   NA 137 09/09/2017  K 3.9 09/09/2017   CL 103 09/09/2017   CREATININE 1.04 09/09/2017   BUN 9 09/09/2017   CO2 26 09/09/2017   TSH 0.81 08/25/2017   PSA 2.51 11/19/2011   INR 2.5 02/18/2018   HGBA1C 5.6 03/11/2016    Ct Chest Wo Contrast  Result Date: 09/11/2017 CLINICAL DATA:  Follow-up indeterminate pulmonary nodules. EXAM: CT CHEST WITHOUT CONTRAST TECHNIQUE: Multidetector CT imaging of the chest was performed following the standard protocol without IV contrast. COMPARISON:  03/16/2015 and 06/22/2014 FINDINGS: Cardiovascular: No acute findings. Aortic atherosclerosis. Previous mitral valve replacement. Mediastinum/Nodes: No masses or pathologically enlarged lymph nodes identified on this unenhanced exam. Lungs/Pleura: Pleural-parenchymal scarring is again seen. 9 mm pulmonary nodule in the anterior right middle lobe on image 88/3  remains stable. A 4 mm nodule in the left lung apex on image 24/3 is also stable. These are consistent with benign postinflammatory etiology. An 8 mm ground-glass nodule is seen in the anterior left upper lobe on image 41/3 and shows no significant change. No new or enlarging pulmonary nodules or masses identified. No evidence of pulmonary infiltrate or pleural effusion. Upper Abdomen:  Unremarkable. Musculoskeletal:  No suspicious bone lesions. IMPRESSION: Stable sub-cm solid pulmonary nodules, consistent with benign etiology. Persistent but stable 8 mm ground-glass nodule in anterior left upper lobe. Low-grade adenocarcinoma cannot be excluded. Recommend continued follow-up by chest CT in 2 years. This recommendation follows the consensus statement: Guidelines for Management of Small Pulmonary Nodules Detected on CT Images: From the Fleischner Society 2017; Radiology 2017; 284:228-243. Aortic Atherosclerosis (ICD10-I70.0). Electronically Signed   By: Earle Gell M.D.   On: 09/11/2017 11:56    Assessment & Plan:   There are no diagnoses linked to this encounter. I am having Iram Lundberg. Strebel maintain his aspirin, loratadine, metoprolol succinate, OVER THE COUNTER MEDICATION, omega-3 acid ethyl esters, multivitamin, warfarin, warfarin, traMADol, and cephALEXin.  No orders of the defined types were placed in this encounter.  Patient will follow-up on Monday for packing removal. Follow-up: Return in about 3 days (around 03/16/2018).  [name], MD

## 2018-03-16 ENCOUNTER — Encounter: Payer: Self-pay | Admitting: Family Medicine

## 2018-03-16 ENCOUNTER — Ambulatory Visit: Payer: BLUE CROSS/BLUE SHIELD | Admitting: Family Medicine

## 2018-03-16 VITALS — BP 128/76 | HR 71 | Temp 97.7°F | Ht 70.5 in | Wt 177.0 lb

## 2018-03-16 DIAGNOSIS — L720 Epidermal cyst: Secondary | ICD-10-CM

## 2018-03-16 NOTE — Progress Notes (Signed)
Subjective:  Patient ID: David [name], male    DOB: 1961/03/24  Age: 57 y.o. MRN: [id number]  CC: Follow-up   HPI SAYLOR SHECKLER presents for packing removal.  It is done well.  There is still some drainage but it is trailing off at this time.  Drainage is been serosanguineous without pus.  Outpatient Medications Prior to Visit  Medication Sig Dispense Refill  . aspirin EC 81 MG EC tablet Take 1 tablet (81 mg total) by mouth daily.    . cephALEXin (KEFLEX) 500 MG capsule Take 1 capsule (500 mg total) by mouth 3 (three) times daily for 10 days. 30 capsule 0  . loratadine (CLARITIN) 10 MG tablet Take 1 tablet (10 mg total) by mouth daily as needed for allergies. 90 tablet 2  . metoprolol succinate (TOPROL-XL) 25 MG 24 hr tablet TAKE 1 TABLET (25 MG TOTAL) DAILY BY MOUTH. 90 tablet 3  . Multiple Vitamin (MULTIVITAMIN) tablet Take 1 tablet by mouth daily.    [name] omega-3 acid ethyl esters (LOVAZA) 1 g capsule Take by mouth 2 (two) times daily.    [name] OVER THE COUNTER MEDICATION 1 tablet.    . ranitidine (ZANTAC 75) 75 MG tablet Take 1 tablet (75 mg total) by mouth 2 (two) times daily. 90 tablet 3  . traMADol (ULTRAM) 50 MG tablet Take 1 tablet (50 mg total) by mouth every 6 (six) hours as needed. 30 tablet 0  . warfarin (COUMADIN) 5 MG tablet Take 1 to 1.5 tablets by mouth daily, need INR for further refills 15 tablet 0  . warfarin (COUMADIN) 5 MG tablet Take 1 tablet by mouth daily or as directed by coumadin clinic 40 tablet 1   No facility-administered medications prior to visit.     ROS Review of Systems  Constitutional: Negative.   Respiratory: Negative.   Cardiovascular: Negative.   Gastrointestinal: Negative.   Skin: Positive for color change and wound. Negative for pallor.  Hematological: Bruises/bleeds easily.  Psychiatric/Behavioral: Negative.     Objective:  BP 128/76 (BP Location: Left Arm, Patient Position: Sitting, Cuff Size: Normal)   Pulse 71   Temp 97.7 F (36.5  C) (Oral)   Ht 5' 10.5" (1.791 m)   Wt 177 lb (80.3 kg)   SpO2 99%   BMI 25.04 kg/m   BP Readings from Last 3 Encounters:  03/16/18 128/76  03/13/18 122/68  03/11/18 122/70    Wt Readings from Last 3 Encounters:  03/16/18 177 lb (80.3 kg)  03/13/18 177 lb (80.3 kg)  03/11/18 171 lb 4 oz (77.7 kg)    Physical Exam  Constitutional: He is oriented to person, place, and time. He appears well-developed and well-nourished. No distress.  HENT:  Head: Normocephalic and atraumatic.  Right Ear: External ear normal.  Left Ear: External ear normal.  Pulmonary/Chest: Effort normal.  Neurological: He is alert and oriented to person, place, and time.  Skin: Skin is warm and dry.     Psychiatric: He has a normal mood and affect. His behavior is normal.    Lab Results  Component Value Date   WBC 2.3 (L) 09/17/2017   HGB 13.0 09/17/2017   HCT 40.0 09/17/2017   PLT 197 09/17/2017   GLUCOSE 86 09/09/2017   CHOL 225 (H) 08/25/2017   TRIG 76.0 08/25/2017   HDL 64.10 08/25/2017   LDLDIRECT 132.4 11/19/2011   LDLCALC 146 (H) 08/25/2017   ALT 30 09/09/2017   AST 29 09/09/2017   NA 137  09/09/2017   K 3.9 09/09/2017   CL 103 09/09/2017   CREATININE 1.04 09/09/2017   BUN 9 09/09/2017   CO2 26 09/09/2017   TSH 0.81 08/25/2017   PSA 2.51 11/19/2011   INR 2.5 02/18/2018   HGBA1C 5.6 03/11/2016    Ct Chest Wo Contrast  Result Date: 09/11/2017 CLINICAL DATA:  Follow-up indeterminate pulmonary nodules. EXAM: CT CHEST WITHOUT CONTRAST TECHNIQUE: Multidetector CT imaging of the chest was performed following the standard protocol without IV contrast. COMPARISON:  03/16/2015 and 06/22/2014 FINDINGS: Cardiovascular: No acute findings. Aortic atherosclerosis. Previous mitral valve replacement. Mediastinum/Nodes: No masses or pathologically enlarged lymph nodes identified on this unenhanced exam. Lungs/Pleura: Pleural-parenchymal scarring is again seen. 9 mm pulmonary nodule in the anterior  right middle lobe on image 88/3 remains stable. A 4 mm nodule in the left lung apex on image 24/3 is also stable. These are consistent with benign postinflammatory etiology. An 8 mm ground-glass nodule is seen in the anterior left upper lobe on image 41/3 and shows no significant change. No new or enlarging pulmonary nodules or masses identified. No evidence of pulmonary infiltrate or pleural effusion. Upper Abdomen:  Unremarkable. Musculoskeletal:  No suspicious bone lesions. IMPRESSION: Stable sub-cm solid pulmonary nodules, consistent with benign etiology. Persistent but stable 8 mm ground-glass nodule in anterior left upper lobe. Low-grade adenocarcinoma cannot be excluded. Recommend continued follow-up by chest CT in 2 years. This recommendation follows the consensus statement: Guidelines for Management of Small Pulmonary Nodules Detected on CT Images: From the Fleischner Society 2017; Radiology 2017; 284:228-243. Aortic Atherosclerosis (ICD10-I70.0). Electronically Signed   By: Earle Gell M.D.   On: 09/11/2017 11:56    Assessment & Plan:   There are no diagnoses linked to this encounter. I am having Elliot Meldrum. [place]bee maintain his aspirin, metoprolol succinate, OVER THE COUNTER MEDICATION, omega-3 acid ethyl esters, multivitamin, warfarin, warfarin, traMADol, cephALEXin, loratadine, and ranitidine.  No orders of the defined types were placed in this encounter.  Advised patient that the wound may drain over the next few days until it seals.  He will follow-up with any problems.  Follow-up: No follow-ups on file.  [name], MD

## 2018-03-19 ENCOUNTER — Ambulatory Visit (INDEPENDENT_AMBULATORY_CARE_PROVIDER_SITE_OTHER): Payer: BLUE CROSS/BLUE SHIELD | Admitting: Pharmacist Clinician (PhC)/ Clinical Pharmacy Specialist

## 2018-03-19 DIAGNOSIS — Z7901 Long term (current) use of anticoagulants: Secondary | ICD-10-CM | POA: Diagnosis not present

## 2018-03-19 DIAGNOSIS — I059 Rheumatic mitral valve disease, unspecified: Secondary | ICD-10-CM | POA: Diagnosis not present

## 2018-03-19 DIAGNOSIS — Z954 Presence of other heart-valve replacement: Secondary | ICD-10-CM

## 2018-03-19 LAB — POCT INR: INR: 3 (ref 2.0–3.0)

## 2018-03-20 ENCOUNTER — Ambulatory Visit: Payer: BLUE CROSS/BLUE SHIELD | Admitting: Family Medicine

## 2018-04-30 ENCOUNTER — Ambulatory Visit (INDEPENDENT_AMBULATORY_CARE_PROVIDER_SITE_OTHER): Payer: BLUE CROSS/BLUE SHIELD | Admitting: Pharmacist Clinician (PhC)/ Clinical Pharmacy Specialist

## 2018-04-30 DIAGNOSIS — Z7901 Long term (current) use of anticoagulants: Secondary | ICD-10-CM | POA: Diagnosis not present

## 2018-04-30 DIAGNOSIS — I059 Rheumatic mitral valve disease, unspecified: Secondary | ICD-10-CM | POA: Diagnosis not present

## 2018-04-30 DIAGNOSIS — Z9889 Other specified postprocedural states: Secondary | ICD-10-CM | POA: Diagnosis not present

## 2018-04-30 DIAGNOSIS — Z954 Presence of other heart-valve replacement: Secondary | ICD-10-CM

## 2018-04-30 LAB — POCT INR: INR: 3.8 — AB (ref 2.0–3.0)

## 2018-04-30 NOTE — Patient Instructions (Signed)
Description   Take 1/2 tablet today Thursday Jan 2, the continue with 1 tablet daily. Repeat INR in 4 weeks

## 2018-05-21 ENCOUNTER — Other Ambulatory Visit: Payer: Self-pay | Admitting: Cardiology

## 2018-05-29 ENCOUNTER — Ambulatory Visit (INDEPENDENT_AMBULATORY_CARE_PROVIDER_SITE_OTHER): Payer: BLUE CROSS/BLUE SHIELD | Admitting: *Deleted

## 2018-05-29 DIAGNOSIS — I059 Rheumatic mitral valve disease, unspecified: Secondary | ICD-10-CM | POA: Diagnosis not present

## 2018-05-29 DIAGNOSIS — Z954 Presence of other heart-valve replacement: Secondary | ICD-10-CM

## 2018-05-29 DIAGNOSIS — Z7901 Long term (current) use of anticoagulants: Secondary | ICD-10-CM

## 2018-05-29 LAB — POCT INR: INR: 2.8 (ref 2.0–3.0)

## 2018-05-29 NOTE — Patient Instructions (Signed)
Description   Continue with 1 tablet daily. Repeat INR in 5 weeks

## 2018-06-30 ENCOUNTER — Ambulatory Visit (INDEPENDENT_AMBULATORY_CARE_PROVIDER_SITE_OTHER): Payer: BLUE CROSS/BLUE SHIELD | Admitting: Pharmacist

## 2018-06-30 DIAGNOSIS — Z7901 Long term (current) use of anticoagulants: Secondary | ICD-10-CM | POA: Diagnosis not present

## 2018-06-30 DIAGNOSIS — Z954 Presence of other heart-valve replacement: Secondary | ICD-10-CM | POA: Diagnosis not present

## 2018-06-30 DIAGNOSIS — I059 Rheumatic mitral valve disease, unspecified: Secondary | ICD-10-CM | POA: Diagnosis not present

## 2018-06-30 LAB — POCT INR: INR: 4.2 — AB (ref 2.0–3.0)

## 2018-06-30 NOTE — Patient Instructions (Signed)
HOLD warfarin dose today ONLY, Continue with 1 tablet daily. Repeat INR in 4 weeks

## 2018-07-28 ENCOUNTER — Telehealth: Payer: Self-pay | Admitting: Pharmacist Clinician (PhC)/ Clinical Pharmacy Specialist

## 2018-07-28 NOTE — Telephone Encounter (Signed)

## 2018-07-29 ENCOUNTER — Other Ambulatory Visit: Payer: Self-pay

## 2018-07-29 ENCOUNTER — Ambulatory Visit (INDEPENDENT_AMBULATORY_CARE_PROVIDER_SITE_OTHER): Payer: BLUE CROSS/BLUE SHIELD | Admitting: *Deleted

## 2018-07-29 DIAGNOSIS — I059 Rheumatic mitral valve disease, unspecified: Secondary | ICD-10-CM

## 2018-07-29 DIAGNOSIS — Z7901 Long term (current) use of anticoagulants: Secondary | ICD-10-CM

## 2018-07-29 DIAGNOSIS — Z954 Presence of other heart-valve replacement: Secondary | ICD-10-CM | POA: Diagnosis not present

## 2018-07-29 LAB — POCT INR: INR: 4.4 — AB (ref 2.0–3.0)

## 2018-08-06 ENCOUNTER — Telehealth: Payer: Self-pay

## 2018-08-06 ENCOUNTER — Other Ambulatory Visit: Payer: Self-pay

## 2018-08-06 MED ORDER — WARFARIN SODIUM 5 MG PO TABS: ORAL_TABLET | ORAL | 1 refills | Status: DC

## 2018-08-06 NOTE — Telephone Encounter (Signed)
done

## 2018-08-11 ENCOUNTER — Other Ambulatory Visit: Payer: Self-pay

## 2018-08-11 MED ORDER — METOPROLOL SUCCINATE ER 25 MG PO TB24: ORAL_TABLET | ORAL | 3 refills | Status: DC

## 2018-08-25 ENCOUNTER — Telehealth: Payer: Self-pay

## 2018-08-25 NOTE — Telephone Encounter (Signed)

## 2018-08-25 NOTE — Telephone Encounter (Signed)
lmom for prescreen  

## 2018-08-26 ENCOUNTER — Other Ambulatory Visit: Payer: Self-pay

## 2018-08-26 ENCOUNTER — Ambulatory Visit (INDEPENDENT_AMBULATORY_CARE_PROVIDER_SITE_OTHER): Payer: BLUE CROSS/BLUE SHIELD

## 2018-08-26 DIAGNOSIS — I059 Rheumatic mitral valve disease, unspecified: Secondary | ICD-10-CM

## 2018-08-26 DIAGNOSIS — Z954 Presence of other heart-valve replacement: Secondary | ICD-10-CM | POA: Diagnosis not present

## 2018-08-26 DIAGNOSIS — Z7901 Long term (current) use of anticoagulants: Secondary | ICD-10-CM

## 2018-08-26 LAB — POCT INR: INR: 3.6 — AB (ref 2.0–3.0)

## 2018-09-22 ENCOUNTER — Telehealth: Payer: Self-pay

## 2018-09-22 NOTE — Telephone Encounter (Signed)
Unable to lmom for prescreen and remind of appt time

## 2018-10-14 ENCOUNTER — Other Ambulatory Visit: Payer: Self-pay | Admitting: Cardiology

## 2018-10-14 NOTE — Telephone Encounter (Signed)
Called and lmomed stated needs an appt for refill... awaiting a callback

## 2018-10-15 ENCOUNTER — Telehealth: Payer: Self-pay

## 2018-10-15 NOTE — Telephone Encounter (Signed)

## 2018-10-15 NOTE — Telephone Encounter (Signed)
Hold for 6/19 appt

## 2018-10-16 ENCOUNTER — Ambulatory Visit (INDEPENDENT_AMBULATORY_CARE_PROVIDER_SITE_OTHER): Payer: 59 | Admitting: Pharmacist

## 2018-10-16 ENCOUNTER — Other Ambulatory Visit: Payer: Self-pay

## 2018-10-16 DIAGNOSIS — Z7901 Long term (current) use of anticoagulants: Secondary | ICD-10-CM

## 2018-10-16 DIAGNOSIS — I059 Rheumatic mitral valve disease, unspecified: Secondary | ICD-10-CM | POA: Diagnosis not present

## 2018-10-16 DIAGNOSIS — Z954 Presence of other heart-valve replacement: Secondary | ICD-10-CM

## 2018-10-16 LAB — POCT INR: INR: 3.9 — AB (ref 2.0–3.0)

## 2018-10-16 MED ORDER — WARFARIN SODIUM 5 MG PO TABS: ORAL_TABLET | ORAL | 1 refills | Status: DC

## 2018-10-28 ENCOUNTER — Encounter: Payer: BLUE CROSS/BLUE SHIELD | Admitting: Family Medicine

## 2018-11-05 ENCOUNTER — Telehealth: Payer: Self-pay

## 2018-11-05 NOTE — Telephone Encounter (Signed)
Unable to lmom for prescreen  

## 2018-11-06 ENCOUNTER — Encounter: Payer: Self-pay | Admitting: Family Medicine

## 2018-11-06 ENCOUNTER — Ambulatory Visit (INDEPENDENT_AMBULATORY_CARE_PROVIDER_SITE_OTHER): Payer: 59 | Admitting: Family Medicine

## 2018-11-06 ENCOUNTER — Telehealth: Payer: Self-pay

## 2018-11-06 VITALS — BP 128/80 | HR 76 | Ht 70.5 in | Wt 169.4 lb

## 2018-11-06 DIAGNOSIS — Z0000 Encounter for general adult medical examination without abnormal findings: Secondary | ICD-10-CM

## 2018-11-06 DIAGNOSIS — E559 Vitamin D deficiency, unspecified: Secondary | ICD-10-CM | POA: Diagnosis not present

## 2018-11-06 DIAGNOSIS — D709 Neutropenia, unspecified: Secondary | ICD-10-CM

## 2018-11-06 LAB — CBC
HCT: 43.7 % (ref 39.0–52.0)
Hemoglobin: 14.1 g/dL (ref 13.0–17.0)
MCHC: 32.4 g/dL (ref 30.0–36.0)
MCV: 92.2 fl (ref 78.0–100.0)
Platelets: 184 10*3/uL (ref 150.0–400.0)
RBC: 4.74 Mil/uL (ref 4.22–5.81)
RDW: 14.5 % (ref 11.5–15.5)
WBC: 2.2 10*3/uL — ABNORMAL LOW (ref 4.0–10.5)

## 2018-11-06 LAB — LIPID PANEL
Cholesterol: 252 mg/dL — ABNORMAL HIGH (ref 0–200)
HDL: 68.1 mg/dL (ref >39.00)
LDL Cholesterol: 166 mg/dL — ABNORMAL HIGH (ref 0–99)
NonHDL: 183.68
Total CHOL/HDL Ratio: 4
Triglycerides: 86 mg/dL (ref 0.0–149.0)
VLDL: 17.2 mg/dL (ref 0.0–40.0)

## 2018-11-06 LAB — URINALYSIS, ROUTINE W REFLEX MICROSCOPIC
Bilirubin Urine: NEGATIVE
Hgb urine dipstick: NEGATIVE
Ketones, ur: NEGATIVE
Leukocytes,Ua: NEGATIVE
Nitrite: NEGATIVE
Specific Gravity, Urine: <=1.005 — AB (ref 1.000–1.030)
Total Protein, Urine: NEGATIVE
Urine Glucose: NEGATIVE
Urobilinogen, UA: 0.2 (ref 0.0–1.0)
pH: 6.5 (ref 5.0–8.0)

## 2018-11-06 LAB — COMPREHENSIVE METABOLIC PANEL
ALT: 36 U/L (ref 0–53)
AST: 30 U/L (ref 0–37)
Albumin: 4.6 g/dL (ref 3.5–5.2)
Alkaline Phosphatase: 64 U/L (ref 39–117)
BUN: 9 mg/dL (ref 6–23)
CO2: 29 mEq/L (ref 19–32)
Calcium: 9.5 mg/dL (ref 8.4–10.5)
Chloride: 103 mEq/L (ref 96–112)
Creatinine, Ser: 1.01 mg/dL (ref 0.40–1.50)
GFR: 91.5 mL/min (ref >60.00)
Glucose, Bld: 89 mg/dL (ref 70–99)
Potassium: 4.1 mEq/L (ref 3.5–5.1)
Sodium: 138 mEq/L (ref 135–145)
Total Bilirubin: 0.6 mg/dL (ref 0.2–1.2)
Total Protein: 7.5 g/dL (ref 6.0–8.3)

## 2018-11-06 LAB — VITAMIN D 25 HYDROXY (VIT D DEFICIENCY, FRACTURES): VITD: 20.88 ng/mL — ABNORMAL LOW (ref 30.00–100.00)

## 2018-11-06 LAB — LDL CHOLESTEROL, DIRECT: Direct LDL: 166 mg/dL

## 2018-11-06 NOTE — Progress Notes (Addendum)
Established Patient Office Visit  Subjective:  Patient ID: David [name], male    DOB: [birth year]  Age: 58 y.o. MRN: [id number]  CC:  Chief Complaint  Patient presents with  . Annual Exam    HPI David [name] presents for complete physical exam.  Patient has been doing well.  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  He lives with his wife mother-in-law 49 year old son.  1 is healthy in the home.  He walks a lot on his job and works out in his yard.  He does not smoke drink alcohol or use illicit drugs.  Seeing the eye doctor regularly.  He is in need of dental care.  He follows up with the urologist yearly.  Prostate cancer is doing well as far as he knows.  It has been treated with seeding.  He did see the hematologist for his neutropenia.  Hematology feels as though this is secondary to the metoprolol.  Patient will see his cardiologist about switching beta-blockers.  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  It bothers him from time to time.  He has treated it with over-the-counter inserts.  Past Medical History:  Diagnosis Date  . DDD (degenerative disc disease), lumbosacral   . Hemolytic anemia ([place]) 06/29/2014  . History of bacterial endocarditis    07/ 2001  enterococcal//  recurrence  11/ 2009  steptococcus viridans  . History of chest tube placement    1980's --- R SIDE  SPONTANEOUS  PNEUMOTHORAX  . History of Clostridium difficile colitis    2009  . History of periodontal disease   . Prostate cancer [facility]) urologist--  dr herrick/  oncologist-- dr Tammi Klippel   Stage  T1c,  Gleason 3+4,  PSA 3.76,  vol 33.45cc  . Pulmonary nodules followed by dr Stanford Breed   right upper & lower lobe and left lower lobe--  stable per CT 08-29-2014  . S/P redo mitral valve replacement with metallic valve cardiologist--  dr Stanford Breed   08-11-2014--- 31 mm Sorin Carbomedics Optiform bileaflet mechanical prosthesis  . Thoracic radiculopathy    T7  (positional)  per PCP note--  pt doing  yoga and swimming state has helpted  . Wears glasses   . Wears partial dentures    upper    Past Surgical History:  Procedure Laterality Date  . CARDIAC CATHETERIZATION  03-15-2008  dr Lia Foyer   no sig. coronary disease/  normal LVF/  moderate pulmonary hypertension w/ elevated V wave  . COLONOSCOPY  last one 12/ 2013  . ESOPHAGOGASTRODUODENOSCOPY N/A 06/02/2014   Procedure: ESOPHAGOGASTRODUODENOSCOPY (EGD);  Surgeon: Jerene Bears, MD;  Location: [facility] ENDOSCOPY;  Service: Endoscopy;  Laterality: N/A;  . LEFT AND RIGHT HEART CATHETERIZATION WITH CORONARY ANGIOGRAM N/A 08/10/2014   Procedure: LEFT AND RIGHT HEART CATHETERIZATION WITH CORONARY ANGIOGRAM;  Surgeon: Belva Crome, MD;  Location: [facility] CATH LAB;  Service: Cardiovascular;  Laterality: N/A;   EF 55%,  severe MR, severe pulm HTN,  40% LAD  . MITRAL VALVE REPAIR  03-22-2008   via  right mini thoracotomy  . MITRAL VALVE REPAIR N/A 08/11/2014   Procedure: REDO MITRAL VALVE (MV) REPLACEMENT;  Surgeon: Rexene Alberts, MD;  Location: [place];  Service: Open Heart Surgery;  Laterality: N/A;  NO NECK LINES ON RIGHT SIDE  . MULTIPLE TOOTH EXTRACTIONS  03-16-2008    w/ alveoloplasty  . PROSTATE BIOPSY  03/2014  . RADIOACTIVE SEED IMPLANT N/A 03/17/2015   Procedure: RADIOACTIVE SEED IMPLANT/BRACHYTHERAPY  IMPLANT;  Surgeon: Ardis Hughs, MD;  Location: [facility];  Service: Urology;  Laterality: N/A;  DR PORTABLE  . TEE WITHOUT CARDIOVERSION N/A 07/19/2014   Procedure: TRANSESOPHAGEAL ECHOCARDIOGRAM (TEE);  Surgeon: Lelon Perla, MD;  Location: [facility] ENDOSCOPY;  Service: Cardiovascular;  Laterality: N/A;  . TEE WITHOUT CARDIOVERSION N/A 08/11/2014   Procedure: TRANSESOPHAGEAL ECHOCARDIOGRAM (TEE);  Surgeon: Rexene Alberts, MD;  Location: [place];  Service: Open Heart Surgery;  Laterality: N/A;  . TRANSTHORACIC ECHOCARDIOGRAM  09-23-2014   dr  Stanford Breed   mild focal basal LVH of the septum,  ef 45-50%,  normal wall motion/   mechanical bileaflet MV sits well in mitrial position, no sig. regurg., valve area 1.61 cm^2,  mean grandient 53mHg/  mild LAE/  RA prominent Chiari malformation , unchanged from prior study, normal size/  mild TR/  mild to moderate pulmonic insuffiency    Family History  Problem Relation Age of Onset  . Ovarian cancer Sister   . Alzheimer's disease Father   . Diabetes Neg Hx   . Heart disease Neg Hx   . Stroke Neg Hx     Social History   Socioeconomic History  . Marital status: Married    Spouse name: Not on file  . Number of children: 3  . Years of education: 164 . Highest education level: Not on file  Occupational History  . 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 Financial resource strain: Not on file  . Food insecurity    Worry: Not on file    Inability: Not on file  . Transportation needs    Medical: Not on file    Non-medical: Not on file  Tobacco Use  . Smoking status: Former Smoker    Packs/day: 2.00    Years: 15.00    Pack years: 30.00    Types: Cigarettes    Quit date: 08/08/1989    Years since quitting: 29.2  . Smokeless tobacco: Never Used  Substance and Sexual Activity  . Alcohol use: No    Alcohol/week: 0.0 standard drinks    Comment: "stopped drinking in 1991  . Drug use: No    Types: Cocaine, Heroin, Marijuana    Comment: "stopped using drugs in ~ 1984"  . Sexual activity: Not on file  Lifestyle  . Physical activity    Days per week: Not on file    Minutes per session: Not on file  . Stress: Not on file  Relationships  . Social [place] phone: Not on file    Gets together: Not on file    Attends religious service: Not on file    Active member of club or organization: Not on file    Attends meetings of clubs or organizations: Not on file    Relationship status: Not on file  . Intimate partner violence    Fear of current or ex partner: Not on  file    Emotionally abused: Not on file    Physically abused: Not on file    Forced sexual activity: Not on file  Other Topics Concern  . Not on file  Social History Narrative   REGULAR EXERCISE 2X WK TREADMILL,WEIGHTS    Outpatient Medications Prior to Visit  Medication Sig Dispense Refill  . aspirin EC 81 MG EC tablet Take 1 tablet (81 mg total) by mouth daily.    .[name]  co-enzyme Q-10 30 MG capsule Take 30 mg by mouth daily.    [name] loratadine (CLARITIN) 10 MG tablet Take 1 tablet (10 mg total) by mouth daily as needed for allergies. 90 tablet 2  . metoprolol succinate (TOPROL-XL) 25 MG 24 hr tablet TAKE 1 TABLET (25 MG TOTAL) DAILY BY MOUTH. 90 tablet 3  . Multiple Minerals (JOINT HEALTH MINERAL PO) Take 1 tablet by mouth daily.    . Multiple Vitamin (MULTIVITAMIN) tablet Take 1 tablet by mouth daily.    [name] omega-3 acid ethyl esters (LOVAZA) 1 g capsule Take by mouth 2 (two) times daily.    [name] OVER THE COUNTER MEDICATION 1 tablet.    . ranitidine (ZANTAC 75) 75 MG tablet Take 1 tablet (75 mg total) by mouth 2 (two) times daily. 90 tablet 3  . warfarin (COUMADIN) 5 MG tablet TAKE 1 TABLET BY MOUTH DAILY 40 tablet 1  . traMADol (ULTRAM) 50 MG tablet Take 1 tablet (50 mg total) by mouth every 6 (six) hours as needed. 30 tablet 0   No facility-administered medications prior to visit.     Allergies  Allergen Reactions  . Clarithromycin Nausea Only    ROS Review of Systems  Constitutional: Negative.   HENT: Negative.   Eyes: Negative for photophobia and visual disturbance.  Respiratory: Negative.   Cardiovascular: Negative.   Gastrointestinal: Negative.   Endocrine: Negative for polyphagia and polyuria.  Genitourinary: Negative.   Musculoskeletal: Negative.   Skin: Negative for pallor and rash.  Allergic/Immunologic: Negative for immunocompromised state.  Neurological: Negative for seizures, light-headedness and numbness.  Hematological: Negative for adenopathy.   Psychiatric/Behavioral: Negative.       Objective:    Physical Exam  Constitutional: He is oriented to person, place, and time. He appears well-developed and well-nourished. No distress.  HENT:  Head: Normocephalic and atraumatic.  Right Ear: External ear normal.  Left Ear: External ear normal.  Eyes: Pupils are equal, round, and reactive to light. Conjunctivae are normal. Right eye exhibits no discharge. Left eye exhibits no discharge. No scleral icterus.  Neck: Neck supple. No JVD present. No tracheal deviation present. No thyromegaly present.  Cardiovascular: Normal rate, regular rhythm and normal heart sounds.  Pulmonary/Chest: Effort normal and breath sounds normal. No stridor.  Abdominal: Bowel sounds are normal. He exhibits no distension. There is no abdominal tenderness. There is no rebound. Hernia confirmed negative in the right inguinal area and confirmed negative in the left inguinal area.  Genitourinary: Rectum:     Guaiac result negative.     No rectal mass, anal fissure, tenderness, external hemorrhoid, internal hemorrhoid or abnormal anal tone.  Prostate is enlarged. Prostate is not tender. Right testis shows no mass, no swelling and no tenderness. Right testis is descended. Left testis shows no mass, no swelling and no tenderness. Left testis is descended. Circumcised. No hypospadias, penile erythema or penile tenderness. No discharge found.  Musculoskeletal: Normal range of motion.        General: No edema.  Lymphadenopathy:    He has no cervical adenopathy.       Right: No inguinal adenopathy present.       Left: No inguinal adenopathy present.  Neurological: He is alert and oriented to person, place, and time.  Skin: Skin is warm and dry. He is not diaphoretic.  Psychiatric: He has a normal mood and affect. His behavior is normal.    BP 128/80   Pulse 76   Ht 5' 10.5" (1.791 m)  Wt 169 lb 6 oz (76.8 kg)   SpO2 99%   BMI 23.96 kg/m  Wt Readings from Last 3  Encounters:  11/06/18 169 lb 6 oz (76.8 kg)  03/16/18 177 lb (80.3 kg)  03/13/18 177 lb (80.3 kg)   BP Readings from Last 3 Encounters:  11/06/18 128/80  03/16/18 128/76  03/13/18 122/68   Guideline developer:  UpToDate (see UpToDate for funding source) Date Released: June 2014  There are no preventive care reminders to display for this patient.  There are no preventive care reminders to display for this patient.  Lab Results  Component Value Date   TSH 0.81 08/25/2017   Lab Results  Component Value Date   WBC 2.2 (L) 11/06/2018   HGB 14.1 11/06/2018   HCT 43.7 11/06/2018   MCV 92.2 11/06/2018   PLT 184.0 11/06/2018   Lab Results  Component Value Date   NA 138 11/06/2018   K 4.1 11/06/2018   CHLORIDE 106 09/27/2014   CO2 29 11/06/2018   GLUCOSE 89 11/06/2018   BUN 9 11/06/2018   CREATININE 1.01 11/06/2018   BILITOT 0.6 11/06/2018   ALKPHOS 64 11/06/2018   AST 30 11/06/2018   ALT 36 11/06/2018   PROT 7.5 11/06/2018   ALBUMIN 4.6 11/06/2018   CALCIUM 9.5 11/06/2018   ANIONGAP 8 09/09/2017   EGFR >90 09/27/2014   GFR 91.50 11/06/2018   Lab Results  Component Value Date   CHOL 252 (H) 11/06/2018   Lab Results  Component Value Date   HDL 68.10 11/06/2018   Lab Results  Component Value Date   LDLCALC 166 (H) 11/06/2018   Lab Results  Component Value Date   TRIG 86.0 11/06/2018   Lab Results  Component Value Date   CHOLHDL 4 11/06/2018   Lab Results  Component Value Date   HGBA1C 5.6 03/11/2016      Assessment & Plan:   Problem List Items Addressed This Visit      Other   Healthcare maintenance - Primary   Relevant Orders   CBC (Completed)   Comprehensive metabolic panel (Completed)   Lipid panel (Completed)   LDL cholesterol, direct (Completed)   Urinalysis, Routine w reflex microscopic (Completed)   VITAMIN D 25 Hydroxy (Vit-D Deficiency, Fractures) (Completed)   Neutropenia (HCC)   Relevant Orders   CBC (Completed)    Other Visit  Diagnoses    Vitamin D deficiency       Relevant Medications   Vitamin D, Ergocalciferol, (DRISDOL) 1.25 MG (50000 UT) CAPS capsule      Meds ordered this encounter  Medications  . Vitamin D, Ergocalciferol, (DRISDOL) 1.25 MG (50000 UT) CAPS capsule    Sig: Take 1 capsule (50,000 Units total) by mouth every 7 (seven) days.    Dispense:  5 capsule    Refill:  5    Follow-up: Return in about 6 months (around 05/09/2019).    Patient was given information on health maintenance and disease prevention.  Encouraged him to see a dentist for his dental care.  He is aware of the relationship between heart and dental health.  Encouraged him to follow-up with cardiology for possible change in beta-blockers.  Unfortunately there may be a class effect of beta-blockers on the bone marrow.  Offered referral to sports medicine for his right foot deformity.  He declines for now.  He was given information on exercising to stay healthy.

## 2018-11-06 NOTE — Patient Instructions (Signed)
Health Maintenance, Male Adopting a healthy lifestyle and getting preventive care are important in promoting health and wellness. Ask your health care provider about:  The right schedule for you to have regular tests and exams.  Things you can do on your own to prevent diseases and keep yourself healthy. What should I know about diet, weight, and exercise? Eat a healthy diet   Eat a diet that includes plenty of vegetables, fruits, low-fat dairy products, and lean protein.  Do not eat a lot of foods that are high in solid fats, added sugars, or sodium. Maintain a healthy weight Body mass index (BMI) is a measurement that can be used to identify possible weight problems. It estimates body fat based on height and weight. Your health care provider can help determine your BMI and help you achieve or maintain a healthy weight. Get regular exercise Get regular exercise. This is one of the most important things you can do for your health. Most adults should:  Exercise for at least 150 minutes each week. The exercise should increase your heart rate and make you sweat (moderate-intensity exercise).  Do strengthening exercises at least twice a week. This is in addition to the moderate-intensity exercise.  Spend less time sitting. Even light physical activity can be beneficial. Watch cholesterol and blood lipids Have your blood tested for lipids and cholesterol at 59 years of age, then have this test every 5 years. You may need to have your cholesterol levels checked more often if:  Your lipid or cholesterol levels are high.  You are older than 59 years of age.  You are at high risk for heart disease. What should I know about cancer screening? Many types of cancers can be detected early and may often be prevented. Depending on your health history and family history, you may need to have cancer screening at various ages. This may include screening for:  Colorectal cancer.  Prostate cancer.   Skin cancer.  Lung cancer. What should I know about heart disease, diabetes, and high blood pressure? Blood pressure and heart disease  High blood pressure causes heart disease and increases the risk of stroke. This is more likely to develop in people who have high blood pressure readings, are of African descent, or are overweight.  Talk with your health care provider about your target blood pressure readings.  Have your blood pressure checked: ? Every 3-5 years if you are 59-12 years of age. ? Every year if you are 26 years old or older.  If you are between the ages of 57 and 39 and are a current or former smoker, ask your health care provider if you should have a one-time screening for abdominal aortic aneurysm (AAA). Diabetes Have regular diabetes screenings. This checks your fasting blood sugar level. Have the screening done:  Once every three years after age 15 if you are at a normal weight and have a low risk for diabetes.  More often and at a younger age if you are overweight or have a high risk for diabetes. What should I know about preventing infection? Hepatitis B If you have a higher risk for hepatitis B, you should be screened for this virus. Talk with your health care provider to find out if you are at risk for hepatitis B infection. Hepatitis C Blood testing is recommended for:  Everyone born from [birth year] through 1966.  Anyone with known risk factors for hepatitis C. Sexually transmitted infections (STIs)  You should be screened each year  for STIs, including gonorrhea and chlamydia, if: ? You are sexually active and are younger than 59 years of age. ? You are older than 59 years of age and your health care provider tells you that you are at risk for this type of infection. ? Your sexual activity has changed since you were last screened, and you are at increased risk for chlamydia or gonorrhea. Ask your health care provider if you are at risk.  Ask your health care  provider about whether you are at high risk for HIV. Your health care provider may recommend a prescription medicine to help prevent HIV infection. If you choose to take medicine to prevent HIV, you should first get tested for HIV. You should then be tested every 3 months for as long as you are taking the medicine. Follow these instructions at home: Lifestyle  Do not use any products that contain nicotine or tobacco, such as cigarettes, e-cigarettes, and chewing tobacco. If you need help quitting, ask your health care provider.  Do not use street drugs.  Do not share needles.  Ask your health care provider for help if you need support or information about quitting drugs. Alcohol use  Do not drink alcohol if your health care provider tells you not to drink.  If you drink alcohol: ? Limit how much you have to 0-2 drinks a day. ? Be aware of how much alcohol is in your drink. In the [place] one drink equals one 12 oz bottle of beer (355 mL), one 5 oz glass of wine (148 mL), or one 1 oz glass of hard liquor (44 mL). General instructions  Schedule regular health, dental, and eye exams.  Stay current with your vaccines.  Tell your health care provider if: ? You often feel depressed. ? You have ever been abused or do not feel safe at home. Summary  Adopting a healthy lifestyle and getting preventive care are important in promoting health and wellness.  Follow your health care provider's instructions about healthy diet, exercising, and getting tested or screened for diseases.  Follow your health care provider's instructions on monitoring your cholesterol and blood pressure. This information is not intended to replace advice given to you by your health care provider. Make sure you discuss any questions you have with your health care provider. Document Released: 10/12/2007 Document Revised: 04/08/2018 Document Reviewed: 04/08/2018 Elsevier Patient Education  2020 [employer].  Exercising  to Stay Healthy To become healthy and stay healthy, it is recommended that you do moderate-intensity and vigorous-intensity exercise. You can tell that you are exercising at a moderate intensity if your heart starts beating faster and you start breathing faster but can still hold a conversation. You can tell that you are exercising at a vigorous intensity if you are breathing much harder and faster and cannot hold a conversation while exercising. Exercising regularly is important. It has many health benefits, such as:  Improving overall fitness, flexibility, and endurance.  Increasing bone density.  Helping with weight control.  Decreasing body fat.  Increasing muscle strength.  Reducing stress and tension.  Improving overall health. How often should I exercise? Choose an activity that you enjoy, and set realistic goals. Your health care provider can help you make an activity plan that works for you. Exercise regularly as told by your health care provider. This may include:  Doing strength training two times a week, such as: ? Lifting weights. ? Using resistance bands. ? Push-ups. ? Sit-ups. ? Yoga.  Doing  a certain intensity of exercise for a given amount of time. Choose from these options: ? A total of 150 minutes of moderate-intensity exercise every week. ? A total of 75 minutes of vigorous-intensity exercise every week. ? A mix of moderate-intensity and vigorous-intensity exercise every week. Children, pregnant women, people who have not exercised regularly, people who are overweight, and older adults may need to talk with a health care provider about what activities are safe to do. If you have a medical condition, be sure to talk with your health care provider before you start a new exercise program. What are some exercise ideas? Moderate-intensity exercise ideas include:  Walking 1 mile (1.6 km) in about 15 minutes.  Biking.  Hiking.  Golfing.  Dancing.  Water  aerobics. Vigorous-intensity exercise ideas include:  Walking 4.5 miles (7.2 km) or more in about 1 hour.  Jogging or running 5 miles (8 km) in about 1 hour.  Biking 10 miles (16.1 km) or more in about 1 hour.  Lap swimming.  Roller-skating or in-line skating.  Cross-country skiing.  Vigorous competitive sports, such as football, basketball, and soccer.  Jumping rope.  Aerobic dancing. What are some everyday activities that can help me to get exercise?  [place] work, such as: ? Pushing a Conservation officer, nature. ? Raking and bagging leaves.  Washing your car.  Pushing a stroller.  Shoveling snow.  Gardening.  Washing windows or floors. How can I be more active in my day-to-day activities?  Use stairs instead of an elevator.  Take a walk during your lunch break.  If you drive, park your car farther away from your work or school.  If you take public transportation, get off one stop early and walk the rest of the way.  Stand up or walk around during all of your indoor phone calls.  Get up, stretch, and walk around every 30 minutes throughout the day.  Enjoy exercise with a friend. Support to continue exercising will help you keep a regular routine of activity. What guidelines can I follow while exercising?  Before you start a new exercise program, talk with your health care provider.  Do not exercise so much that you hurt yourself, feel dizzy, or get very short of breath.  Wear comfortable clothes and wear shoes with good support.  Drink plenty of water while you exercise to prevent dehydration or heat stroke.  Work out until your breathing and your heartbeat get faster. Where to find more information  [place].S. Department of Health and Human Services: BondedCompany.at  Centers for Disease Control and Prevention (CDC): http://www.wolf.info/ Summary  Exercising regularly is important. It will improve your overall fitness, flexibility, and endurance.  Regular exercise also will improve  your overall health. It can help you control your weight, reduce stress, and improve your bone density.  Do not exercise so much that you hurt yourself, feel dizzy, or get very short of breath.  Before you start a new exercise program, talk with your health care provider. This information is not intended to replace advice given to you by your health care provider. Make sure you discuss any questions you have with your health care provider. Document Released: 05/18/2010 Document Revised: 03/28/2017 Document Reviewed: 03/06/2017 Elsevier Patient Education  2020 [employer].

## 2018-11-06 NOTE — Telephone Encounter (Signed)

## 2018-11-09 MED ORDER — VITAMIN D (ERGOCALCIFEROL) 1.25 MG (50000 UNIT) PO CAPS: 50000.0000 [IU] | ORAL_CAPSULE | ORAL | 5 refills | Status: DC

## 2018-11-09 NOTE — Addendum Note (Signed)
Addended by: Abelino Derrick A on: 11/09/2018 10:04 AM   Modules accepted: Orders

## 2018-11-12 ENCOUNTER — Ambulatory Visit (INDEPENDENT_AMBULATORY_CARE_PROVIDER_SITE_OTHER): Payer: 59 | Admitting: Pharmacist Clinician (PhC)/ Clinical Pharmacy Specialist

## 2018-11-12 ENCOUNTER — Other Ambulatory Visit: Payer: Self-pay

## 2018-11-12 DIAGNOSIS — I059 Rheumatic mitral valve disease, unspecified: Secondary | ICD-10-CM

## 2018-11-12 DIAGNOSIS — Z954 Presence of other heart-valve replacement: Secondary | ICD-10-CM | POA: Diagnosis not present

## 2018-11-12 DIAGNOSIS — Z7901 Long term (current) use of anticoagulants: Secondary | ICD-10-CM

## 2018-11-12 LAB — POCT INR: INR: 2.4 (ref 2.0–3.0)

## 2018-12-04 ENCOUNTER — Ambulatory Visit (INDEPENDENT_AMBULATORY_CARE_PROVIDER_SITE_OTHER): Payer: 59 | Admitting: Pharmacist Clinician (PhC)/ Clinical Pharmacy Specialist

## 2018-12-04 ENCOUNTER — Other Ambulatory Visit: Payer: Self-pay

## 2018-12-04 DIAGNOSIS — Z954 Presence of other heart-valve replacement: Secondary | ICD-10-CM

## 2018-12-04 DIAGNOSIS — I059 Rheumatic mitral valve disease, unspecified: Secondary | ICD-10-CM | POA: Diagnosis not present

## 2018-12-04 DIAGNOSIS — Z7901 Long term (current) use of anticoagulants: Secondary | ICD-10-CM

## 2018-12-04 LAB — POCT INR: INR: 2 (ref 2.0–3.0)

## 2018-12-13 ENCOUNTER — Other Ambulatory Visit: Payer: Self-pay | Admitting: Family Medicine

## 2018-12-17 ENCOUNTER — Other Ambulatory Visit: Payer: Self-pay | Admitting: Cardiology

## 2018-12-18 NOTE — Telephone Encounter (Signed)
Please review for refill. Thank you! 

## 2018-12-21 ENCOUNTER — Ambulatory Visit (INDEPENDENT_AMBULATORY_CARE_PROVIDER_SITE_OTHER): Payer: 59 | Admitting: Pharmacist Clinician (PhC)/ Clinical Pharmacy Specialist

## 2018-12-21 ENCOUNTER — Other Ambulatory Visit: Payer: Self-pay

## 2018-12-21 DIAGNOSIS — Z954 Presence of other heart-valve replacement: Secondary | ICD-10-CM | POA: Diagnosis not present

## 2018-12-21 DIAGNOSIS — I059 Rheumatic mitral valve disease, unspecified: Secondary | ICD-10-CM

## 2018-12-21 DIAGNOSIS — Z7901 Long term (current) use of anticoagulants: Secondary | ICD-10-CM

## 2018-12-21 LAB — POCT INR: INR: 3.3 — AB (ref 2.0–3.0)

## 2019-01-12 ENCOUNTER — Other Ambulatory Visit: Payer: Self-pay | Admitting: Cardiology

## 2019-01-18 ENCOUNTER — Ambulatory Visit (INDEPENDENT_AMBULATORY_CARE_PROVIDER_SITE_OTHER): Payer: 59 | Admitting: Pharmacist Clinician (PhC)/ Clinical Pharmacy Specialist

## 2019-01-18 ENCOUNTER — Other Ambulatory Visit: Payer: Self-pay

## 2019-01-18 DIAGNOSIS — I059 Rheumatic mitral valve disease, unspecified: Secondary | ICD-10-CM

## 2019-01-18 DIAGNOSIS — Z954 Presence of other heart-valve replacement: Secondary | ICD-10-CM

## 2019-01-18 DIAGNOSIS — Z7901 Long term (current) use of anticoagulants: Secondary | ICD-10-CM

## 2019-01-18 LAB — POCT INR: INR: 3.3 — AB (ref 2.0–3.0)

## 2019-02-17 ENCOUNTER — Telehealth: Payer: Self-pay | Admitting: Family Medicine

## 2019-02-17 NOTE — Telephone Encounter (Signed)
Pt called and said he needs a note proving he had his physical for this year for his insurance, are you able to help him with this?

## 2019-02-17 NOTE — Telephone Encounter (Signed)
Letter written and sent via mychart. Pt is aware.

## 2019-03-01 ENCOUNTER — Other Ambulatory Visit: Payer: Self-pay

## 2019-03-01 ENCOUNTER — Ambulatory Visit (INDEPENDENT_AMBULATORY_CARE_PROVIDER_SITE_OTHER): Payer: 59 | Admitting: Pharmacist Clinician (PhC)/ Clinical Pharmacy Specialist

## 2019-03-01 DIAGNOSIS — I059 Rheumatic mitral valve disease, unspecified: Secondary | ICD-10-CM

## 2019-03-01 DIAGNOSIS — Z7901 Long term (current) use of anticoagulants: Secondary | ICD-10-CM

## 2019-03-01 DIAGNOSIS — Z954 Presence of other heart-valve replacement: Secondary | ICD-10-CM

## 2019-03-01 LAB — POCT INR: INR: 2.7 (ref 2.0–3.0)

## 2019-03-01 MED ORDER — WARFARIN SODIUM 5 MG PO TABS: 5.0000 mg | ORAL_TABLET | Freq: Every day | ORAL | 1 refills | Status: DC

## 2019-04-15 ENCOUNTER — Telehealth: Payer: Self-pay

## 2019-04-15 ENCOUNTER — Other Ambulatory Visit: Payer: Self-pay | Admitting: Family Medicine

## 2019-04-15 DIAGNOSIS — E559 Vitamin D deficiency, unspecified: Secondary | ICD-10-CM

## 2019-04-15 NOTE — Telephone Encounter (Signed)
lmom for overdue inr 

## 2019-04-20 ENCOUNTER — Telehealth: Payer: Self-pay

## 2019-04-20 NOTE — Telephone Encounter (Signed)
lmom for overdue inr 

## 2019-04-21 ENCOUNTER — Telehealth: Payer: Self-pay

## 2019-04-21 NOTE — Telephone Encounter (Signed)
lmomed the pt to remind him that his appt was at [facility] and left the address and callback number if he has in questions instructed him to call

## 2019-04-22 ENCOUNTER — Ambulatory Visit (INDEPENDENT_AMBULATORY_CARE_PROVIDER_SITE_OTHER): Payer: Self-pay

## 2019-04-22 ENCOUNTER — Other Ambulatory Visit: Payer: Self-pay

## 2019-04-22 DIAGNOSIS — I059 Rheumatic mitral valve disease, unspecified: Secondary | ICD-10-CM

## 2019-04-22 DIAGNOSIS — Z7901 Long term (current) use of anticoagulants: Secondary | ICD-10-CM

## 2019-04-22 DIAGNOSIS — Z954 Presence of other heart-valve replacement: Secondary | ICD-10-CM

## 2019-04-22 LAB — POCT INR: INR: 2.6 (ref 2.0–3.0)

## 2019-04-22 NOTE — Patient Instructions (Signed)
Description   Continue with 1 tablet daily. Repeat INR in  6 weeks      

## 2019-06-04 ENCOUNTER — Other Ambulatory Visit: Payer: Self-pay

## 2019-06-04 ENCOUNTER — Ambulatory Visit (INDEPENDENT_AMBULATORY_CARE_PROVIDER_SITE_OTHER): Payer: Managed Care, Other (non HMO) | Admitting: Pharmacist

## 2019-06-04 DIAGNOSIS — I059 Rheumatic mitral valve disease, unspecified: Secondary | ICD-10-CM | POA: Diagnosis not present

## 2019-06-04 DIAGNOSIS — Z954 Presence of other heart-valve replacement: Secondary | ICD-10-CM

## 2019-06-04 DIAGNOSIS — Z7901 Long term (current) use of anticoagulants: Secondary | ICD-10-CM | POA: Diagnosis not present

## 2019-06-04 LAB — POCT INR: INR: 4.4 — AB (ref 2.0–3.0)

## 2019-07-02 ENCOUNTER — Other Ambulatory Visit: Payer: Self-pay

## 2019-07-02 ENCOUNTER — Ambulatory Visit (INDEPENDENT_AMBULATORY_CARE_PROVIDER_SITE_OTHER): Payer: Managed Care, Other (non HMO) | Admitting: Pharmacist Clinician (PhC)/ Clinical Pharmacy Specialist

## 2019-07-02 DIAGNOSIS — Z954 Presence of other heart-valve replacement: Secondary | ICD-10-CM

## 2019-07-02 DIAGNOSIS — I059 Rheumatic mitral valve disease, unspecified: Secondary | ICD-10-CM

## 2019-07-02 DIAGNOSIS — Z7901 Long term (current) use of anticoagulants: Secondary | ICD-10-CM

## 2019-07-02 LAB — POCT INR: INR: 2.8 (ref 2.0–3.0)

## 2019-08-06 ENCOUNTER — Other Ambulatory Visit: Payer: Self-pay | Admitting: Cardiology

## 2019-08-21 ENCOUNTER — Other Ambulatory Visit: Payer: Self-pay | Admitting: Cardiology

## 2019-08-30 ENCOUNTER — Encounter: Payer: Self-pay | Admitting: *Deleted

## 2019-09-01 ENCOUNTER — Other Ambulatory Visit: Payer: Self-pay | Admitting: Cardiology

## 2019-09-10 ENCOUNTER — Other Ambulatory Visit: Payer: Self-pay | Admitting: Cardiology

## 2019-09-14 ENCOUNTER — Telehealth: Payer: Self-pay | Admitting: *Deleted

## 2019-09-14 NOTE — Telephone Encounter (Signed)
Left message for pt to call, it is time to redo the chest CT to follow up on lung nodule. Does he want to schedule now?

## 2019-09-22 NOTE — Progress Notes (Signed)
Cardiology Office Note:    Date:  09/29/2019   ID:  David [name], DOB 07/12/1960, MRN [id number]  PCP:  [name], MD  Cardiologist:  Kirk Ruths, MD  Electrophysiologist:  None   Referring MD: [name],*   Chief Complaint: follow-up of endocarditis and MVR  History of Present Illness:    David [name] is a 59 y.o. male with a history of endocarditis with severe mitral regurgitation s/p mitral valve repair with mini thoracotomy in 2009 and then mechanical mitral valve replacement in 07/2014, pulmonary nodules, and pulmonary hypertension who is followed by Dr. Stanford Breed and presents today for follow-up.   Patient has a history of endocarditis in 10/1999 with recurrence in 02/2008. He was noted to have severe mitral regurgitation in 2009 and underwent mitral valve repair with mini thoracotomy in 02/2008. TEE in 06/2014 showed normal LV function s/p MV repair with severe MR directed posteriorly and laterally possibly from perforated posterior mitral valve leaflet. Right/left cardiac cath in 07/2014 showed 40% stenosis of LAD and  LVEF of 55% with severe MR and severe pulmonary hypertension. Patient then underwent mitral valve replacement with bileaflet mechanical prosthesis on 08/11/2014. Repeat Echo in 08/2014 showed mildly reduced LVEF of 45-50% with no significant mitral regurgitation and mean gradient of 6 mmHg. There was also mild left atrial enlargement and mild to moderate pulmonic insufficiency. Patient was last seen by Jory Sims, NP, in 06/2017 at which time he was doing well without any cardiac complaints. He was advised to follow-up in 1 year but has not been seen since that time.   Patient also has a history of pulmonary nodule. Last chest CT in 08/2017 showed persistent but stable 39mm ground-glass nodule in the anterior left upper lobe. Low-grade adenocarcinoma could not be excluded. Repeat CT in 2 years was recommended.   Patient presents today for  follow-up. Patient doing well and denies any cardiac symptoms. No chest pain, shortness of breath, palpitations, lightheadedness, syncope, orthopnea, PND, and lower extremity edema.   Past Medical History:  Diagnosis Date  . DDD (degenerative disc disease), lumbosacral   . Hemolytic anemia ([place]) 06/29/2014  . History of bacterial endocarditis    07/ 2001  enterococcal//  recurrence  11/ 2009  steptococcus viridans  . History of chest tube placement    1980's --- R SIDE  SPONTANEOUS  PNEUMOTHORAX  . History of Clostridium difficile colitis    2009  . History of periodontal disease   . Prostate cancer [facility]) urologist--  dr herrick/  oncologist-- dr Tammi Klippel   Stage  T1c,  Gleason 3+4,  PSA 3.76,  vol 33.45cc  . Pulmonary nodules followed by dr Stanford Breed   right upper & lower lobe and left lower lobe--  stable per CT 08-29-2014  . S/P redo mitral valve replacement with metallic valve cardiologist--  dr Stanford Breed   08-11-2014--- 31 mm Sorin [employer]omedics Optiform bileaflet mechanical prosthesis  . Thoracic radiculopathy    T7  (positional)  per PCP note--  pt doing yoga and swimming state has helpted  . Wears glasses   . Wears partial dentures    upper    Past Surgical History:  Procedure Laterality Date  . CARDIAC CATHETERIZATION  03-15-2008  dr Lia Foyer   no sig. coronary disease/  normal LVF/  moderate pulmonary hypertension w/ elevated V wave  . COLONOSCOPY  last one 12/ 2013  . ESOPHAGOGASTRODUODENOSCOPY N/A 06/02/2014   Procedure: ESOPHAGOGASTRODUODENOSCOPY (EGD);  Surgeon: Jerene Bears, MD;  Location:  [facility];  Service: Endoscopy;  Laterality: N/A;  . LEFT AND RIGHT HEART CATHETERIZATION WITH CORONARY ANGIOGRAM N/A 08/10/2014   Procedure: LEFT AND RIGHT HEART CATHETERIZATION WITH CORONARY ANGIOGRAM;  Surgeon: Belva Crome, MD;  Location: [facility] CATH LAB;  Service: Cardiovascular;  Laterality: N/A;   EF 55%,  severe MR, severe pulm HTN,  40% LAD  . MITRAL VALVE REPAIR  03-22-2008   via   right mini thoracotomy  . MITRAL VALVE REPAIR N/A 08/11/2014   Procedure: REDO MITRAL VALVE (MV) REPLACEMENT;  Surgeon: Rexene Alberts, MD;  Location: [facility];  Service: Open Heart Surgery;  Laterality: N/A;  NO NECK LINES ON RIGHT SIDE  . MULTIPLE TOOTH EXTRACTIONS  03-16-2008    w/ alveoloplasty  . PROSTATE BIOPSY  03/2014  . RADIOACTIVE SEED IMPLANT N/A 03/17/2015   Procedure: RADIOACTIVE SEED IMPLANT/BRACHYTHERAPY IMPLANT;  Surgeon: Ardis Hughs, MD;  Location: [facility];  Service: Urology;  Laterality: N/A;  DR PORTABLE  . TEE WITHOUT CARDIOVERSION N/A 07/19/2014   Procedure: TRANSESOPHAGEAL ECHOCARDIOGRAM (TEE);  Surgeon: Lelon Perla, MD;  Location: [facility] ENDOSCOPY;  Service: Cardiovascular;  Laterality: N/A;  . TEE WITHOUT CARDIOVERSION N/A 08/11/2014   Procedure: TRANSESOPHAGEAL ECHOCARDIOGRAM (TEE);  Surgeon: Rexene Alberts, MD;  Location: [facility];  Service: Open Heart Surgery;  Laterality: N/A;  . TRANSTHORACIC ECHOCARDIOGRAM  09-23-2014   dr  Stanford Breed   mild focal basal LVH of the septum,  ef 45-50%,  normal wall motion/  mechanical bileaflet MV sits well in mitrial position, no sig. regurg., valve area 1.61 cm^2,  mean grandient 57mmHg/  mild LAE/  RA prominent Chiari malformation , unchanged from prior study, normal size/  mild TR/  mild to moderate pulmonic insuffiency    Current Medications: Current Meds  Medication Sig  . aspirin EC 81 MG EC tablet Take 1 tablet (81 mg total) by mouth daily.  [name] co-enzyme Q-10 30 MG capsule Take 30 mg by mouth daily.  [name] loratadine (CLARITIN) 10 MG tablet TAKE 1 TABLET BY MOUTH EVERY DAY AS NEEDED FOR ALLERGY  . Multiple Minerals (JOINT HEALTH MINERAL PO) Take 1 tablet by mouth daily.  . Multiple Vitamin (MULTIVITAMIN) tablet Take 1 tablet by mouth daily.  [name] omega-3 acid ethyl esters (LOVAZA) 1 g capsule Take by mouth 2 (two) times daily.  [name] OVER THE COUNTER MEDICATION 1 tablet.  . ranitidine (ZANTAC 75) 75 MG tablet Take 1  tablet (75 mg total) by mouth 2 (two) times daily.  . Vitamin D, Ergocalciferol, (DRISDOL) 1.25 MG (50000 UT) CAPS capsule TAKE 1 CAPSULE BY MOUTH ONCE WEEKLY  . warfarin (COUMADIN) 5 MG tablet Take 1 tablet (5 mg total) by mouth daily. Need to schedule INR and MD appts for refills  . [DISCONTINUED] metoprolol succinate (TOPROL-XL) 25 MG 24 hr tablet TAKE 1 TAB BY MOUTH DAILY. PLEASE SCHEDULE ANNUAL APPT W/DR. CRENSHAW FOR REFILLS     Allergies:   Clarithromycin   Social History   Socioeconomic History  . Marital status: Married    Spouse name: Not on file  . Number of children: 3  . Years of education: 30  . Highest education level: Not on file  Occupational History  . 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 Smoking status: Former Smoker    Packs/day: 2.00    Years: 15.00    Pack years: 30.00    Types:  Cigarettes    Quit date: 08/08/1989    Years since quitting: 30.1  . Smokeless tobacco: Never Used  Substance and Sexual Activity  . Alcohol use: No    Alcohol/week: 0.0 standard drinks    Comment: "stopped drinking in 1991  . Drug use: No    Types: Cocaine, Heroin, Marijuana    Comment: "stopped using drugs in ~ 1984"  . Sexual activity: Not on file  Other Topics Concern  . Not on file  Social History Narrative   REGULAR EXERCISE 2X WK TREADMILL,WEIGHTS   Social Determinants of Health   Financial Resource Strain:   . Difficulty of Paying Living Expenses:   Food Insecurity:   . Worried About Charity fundraiser in the Last Year:   . Arboriculturist in the Last Year:   Transportation Needs:   . Film/video editor (Medical):   [name] Lack of Transportation (Non-Medical):   Physical Activity:   . Days of Exercise per Week:   . Minutes of Exercise per Session:   Stress:   . Feeling of Stress :   Social Connections:   . Frequency of Communication with Friends and Family:   . Frequency  of Social Gatherings with Friends and Family:   . Attends Religious Services:   . Active Member of Clubs or Organizations:   . Attends Archivist Meetings:   [name] Marital Status:      Family History: The patient's family history includes Alzheimer's disease in his father; Ovarian cancer in his sister. There is no history of Diabetes, Heart disease, or Stroke.  ROS:   Please see the history of present illness.    All other systems reviewed and are negative.  EKGs/Labs/Other Studies Reviewed:    The following studies were reviewed today:  Right/Left Heart Catheterization 08/10/2014: Angiographic Date:   - The left main coronary artery is  normal. - The left anterior descending artery is  Widely patent with eccentric 40% mid vessel narrowing. - The left circumflex artery is small and widely patent giving to obtuse marginal branches. - The ramus intermedius is widely patent. - The right coronary artery is dominant and normal.  Left Ventriculogram:  Left ventricular angiogram was done in the 30 RAO projection and revealed a normal cavity size and severe regurgitation with left atrial enlargement. Estimated ejection fraction 55%.  Impressions:    1. Severe mitral regurgitation  2. Preserved left ventricular systolic function  3.  Normal coronary arteries  4. Severe pulmonary artery hypertension (PA systolic greater than 75 mmHg) _______________  Echocardiogram 09/23/2014: Study Conclusions: - Left ventricle: The cavity size was normal. There was mild focal  basal hypertrophy of the septum. Systolic function was mildly  reduced. The estimated ejection fraction was in the range of 45%  to 50%. Wall motion was normal; there were no regional wall  motion abnormalities. The study is not technically sufficient to  allow evaluation of LV diastolic function.  - Mitral valve: Mechanical bileaflet valve sits well in the mitral  position. There is no central or  paravalvular regurgitation.  Leaflets appear to open well.  Transmitral gradient is minimally elevated with mean gradient 6  mmHg. Valve area by continuity equation (using LVOT flow): 1.61  cm^2.  - Left atrium: The atrium was mildly dilated.  - Right ventricle: The cavity size was normal. Wall thickness was  normal. Systolic function was normal.  - Right atrium: There is a prominent Chiari malformation -  unchanged from  the prior study. The atrium was normal in size.  - Tricuspid valve: There was mild regurgitation.  - Pulmonic valve: There was mild to moderate regurgitation.  - Pulmonary arteries: Systolic pressure was within the normal  range.  - Inferior vena cava: The vessel was normal in size. The  respirophasic diameter changes were in the normal range (= 50%),  consistent with normal central venous pressure.  - Pericardium, extracardiac: There was no pericardial effusion.   Impressions: - When compared to the prior study from July 19, 2014 there is now  well functioning mechanical mitral valve. LVEF is slightly decreased estimated at 45-50%.   EKG:  EKG ordered today. EKG personally reviewed and demonstrates normal sinus rhythm, rate 75 bpm, with non-specific intraventricular block. No significant changes compared to prior tracings.  Recent Labs: 11/06/2018: ALT 36; BUN 9; Creatinine, Ser 1.01; Hemoglobin 14.1; Platelets 184.0; Potassium 4.1; Sodium 138  Recent Lipid Panel    Component Value Date/Time   CHOL 252 (H) 11/06/2018 1109   TRIG 86.0 11/06/2018 1109   HDL 68.10 11/06/2018 1109   CHOLHDL 4 11/06/2018 1109   VLDL 17.2 11/06/2018 1109   LDLCALC 166 (H) 11/06/2018 1109   LDLDIRECT 166.0 11/06/2018 1109    Physical Exam:    Vital Signs: BP 134/78   Pulse 62   Ht 5\' 10"  (1.778 m)   Wt 171 lb 3.2 oz (77.7 kg)   SpO2 99%   BMI 24.56 kg/m     Wt Readings from Last 3 Encounters:  09/29/19 171 lb 3.2 oz (77.7 kg)  11/06/18 169 lb 6 oz (76.8  kg)  03/16/18 177 lb (80.3 kg)     General: 59 y.o. male in no acute distress. HEENT: Normocephalic and atraumatic. Sclera clear. EOMs intact. Neck: Supple. No carotid bruits. No JVD. Heart: RRR. Distinct S1 and S2. Mechanical click noted. No murmurs, gallops, or rubs. Radial and distal pedal pulses 2+ and equal bilaterally. Lungs: No increased work of breathing. Clear to ausculation bilaterally. No wheezes, rhonchi, or rales.  Abdomen: Soft, non-distended, and non-tender to palpation.  Extremities: No lower extremity edema.    Skin: Warm and dry. Neuro: No focal deficits. Psych: Normal affect. Responds appropriately.  Assessment:    1. Nonrheumatic mitral valve regurgitation   2. S/P mitral valve replacement   3. History of endocarditis   4. Lung nodule   5. Hyperlipidemia, unspecified hyperlipidemia type     Plan:    Endocarditis with Mitral Regurgitation s/p MVR - S/p mitral valve repair with mini thoracotomy in 02/2008 and then mitral valve replacement with bileaflet mechanical prosthesis in 07/2014.  - Doing well. - Continue Aspirin and Coumadin. Follows in our Coumadin clinic.  - Also on Metoprolol. However, patient has known chronic neutropenia and Hematology thought this could be secondary to Metoprolol. Therefore, will switch to Coreg 3.125mg  twice daily.  - Continue antibiotic prophylaxis - Amoxicillin 2g one hour before any dental procedures. Will give prescription for this today. - Offered repeat Echo. Given no symptoms at this time, patient would like to hold off at this time.  Pulmonary Nodules  - Last chest CT in 08/2017 showed persistent but stable 50mm ground-glass nodule in the anterior left upper lobe. Low-grade adenocarcinoma could not be excluded.  - Repeat CT in 2 years was recommended. Will order today but patient would like to wait until 02/2020 to get this done.  Hyperlipidemia - Most recent lipid panel from 10/2018 at PCP's office: Total Cholesterol 252,  Triglycerides  86, HDL 68, LDL 166. PCP recommended diet modification. - Patient scheduled to see PCP next month with repeat labs at that time. If cholesterol still elevated (which I suspect it will be), recommend starting high-intensity statin.  - Continue Lovaza.   Disposition: Follow up in 6 months with Dr. Stanford Breed.   Medication Adjustments/Labs and Tests Ordered: Current medicines are reviewed at length with the patient today.  Concerns regarding medicines are outlined above.  Orders Placed This Encounter  Procedures  . CT Chest Wo Contrast  . EKG 12-Lead   Meds ordered this encounter  Medications  . carvedilol (COREG) 3.125 MG tablet    Sig: Take 1 tablet (3.125 mg total) by mouth 2 (two) times daily.    Dispense:  180 tablet    Refill:  1  . amoxicillin (AMOXIL) 500 MG tablet    Sig: Take 4 tablets by mouth 1 hour prior to dental exam.    Dispense:  4 tablet    Refill:  0    Patient Instructions  Medication Instructions:   STOP Metoprolol   START Carvedilol 3.125 mg twice daily  Prior to dental exams---take Amoxicillin 500 mg---4 tablets by mouth 1 hour before dental work.  *If you need a refill on your cardiac medications before your next appointment, please call your pharmacy*   Testing/Procedures:  CHEST CT---schedule for November   Follow-Up: At [facility], you and your health needs are our priority.  As part of our continuing mission to provide you with exceptional heart care, we have created designated Provider Care Teams.  These Care Teams include your primary Cardiologist (physician) and Advanced Practice Providers (APPs -  Physician Assistants and Nurse Practitioners) who all work together to provide you with the care you need, when you need it.  We recommend signing up for the patient portal called "MyChart".  Sign up information is provided on this After Visit Summary.  MyChart is used to connect with patients for Virtual Visits (Telemedicine).   Patients are able to view lab/test results, encounter notes, upcoming appointments, etc.  Non-urgent messages can be sent to your provider as well.   To learn more about what you can do with MyChart, go to [email or web address].    Your next appointment:   6 month(s)  The format for your next appointment:   In Person  Provider:   You may see Kirk Ruths, MD or one of the following Advanced Practice Providers on your designated Care Team:    Kerin Ransom, PA-C  [place], [place]  Coletta Memos, [place]    Other Instructions Please call our office 2 months in advance to schedule your follow-up appointment with Dr. Stanford Breed.     Signed, Darreld Mclean, PA-C  09/29/2019 4:54 PM    [facility]

## 2019-09-25 ENCOUNTER — Other Ambulatory Visit: Payer: Self-pay | Admitting: Cardiology

## 2019-09-28 ENCOUNTER — Encounter: Payer: Self-pay | Admitting: *Deleted

## 2019-09-28 ENCOUNTER — Other Ambulatory Visit: Payer: Self-pay | Admitting: Family Medicine

## 2019-09-28 NOTE — Telephone Encounter (Signed)
Hold until seen on 6/2 pt overdue inr and scheduled for tomorrow

## 2019-09-28 NOTE — Telephone Encounter (Signed)
Letter mailed to patient asking him to call.

## 2019-09-29 ENCOUNTER — Encounter: Payer: Self-pay | Admitting: Student

## 2019-09-29 ENCOUNTER — Ambulatory Visit (INDEPENDENT_AMBULATORY_CARE_PROVIDER_SITE_OTHER): Payer: Managed Care, Other (non HMO) | Admitting: Pharmacist

## 2019-09-29 ENCOUNTER — Ambulatory Visit (INDEPENDENT_AMBULATORY_CARE_PROVIDER_SITE_OTHER): Payer: Managed Care, Other (non HMO) | Admitting: Student

## 2019-09-29 ENCOUNTER — Other Ambulatory Visit: Payer: Self-pay

## 2019-09-29 VITALS — BP 134/78 | HR 62 | Ht 70.0 in | Wt 171.2 lb

## 2019-09-29 DIAGNOSIS — Z7901 Long term (current) use of anticoagulants: Secondary | ICD-10-CM | POA: Diagnosis not present

## 2019-09-29 DIAGNOSIS — Z954 Presence of other heart-valve replacement: Secondary | ICD-10-CM | POA: Diagnosis not present

## 2019-09-29 DIAGNOSIS — R911 Solitary pulmonary nodule: Secondary | ICD-10-CM

## 2019-09-29 DIAGNOSIS — I059 Rheumatic mitral valve disease, unspecified: Secondary | ICD-10-CM

## 2019-09-29 DIAGNOSIS — I340 Nonrheumatic mitral (valve) insufficiency: Secondary | ICD-10-CM

## 2019-09-29 DIAGNOSIS — Z8679 Personal history of other diseases of the circulatory system: Secondary | ICD-10-CM

## 2019-09-29 DIAGNOSIS — E785 Hyperlipidemia, unspecified: Secondary | ICD-10-CM

## 2019-09-29 DIAGNOSIS — Z952 Presence of prosthetic heart valve: Secondary | ICD-10-CM

## 2019-09-29 LAB — POCT INR: INR: 3.3 — AB (ref 2.0–3.0)

## 2019-09-29 MED ORDER — AMOXICILLIN 500 MG PO TABS: ORAL_TABLET | ORAL | 0 refills | Status: DC

## 2019-09-29 MED ORDER — CARVEDILOL 3.125 MG PO TABS
3.1250 mg | ORAL_TABLET | Freq: Two times a day (BID) | ORAL | 1 refills | Status: DC
Start: 2019-09-29 — End: 2020-03-28

## 2019-09-29 NOTE — Patient Instructions (Signed)
Medication Instructions:   STOP Metoprolol   START Carvedilol 3.125 mg twice daily  Prior to dental exams---take Amoxicillin 500 mg---4 tablets by mouth 1 hour before dental work.  *If you need a refill on your cardiac medications before your next appointment, please call your pharmacy*   Testing/Procedures:  CHEST CT---schedule for November   Follow-Up: At [facility], you and your health needs are our priority.  As part of our continuing mission to provide you with exceptional heart care, we have created designated Provider Care Teams.  These Care Teams include your primary Cardiologist (physician) and Advanced Practice Providers (APPs -  Physician Assistants and Nurse Practitioners) who all work together to provide you with the care you need, when you need it.  We recommend signing up for the patient portal called "MyChart".  Sign up information is provided on this After Visit Summary.  MyChart is used to connect with patients for Virtual Visits (Telemedicine).  Patients are able to view lab/test results, encounter notes, upcoming appointments, etc.  Non-urgent messages can be sent to your provider as well.   To learn more about what you can do with MyChart, go to [email or web address].    Your next appointment:   6 month(s)  The format for your next appointment:   In Person  Provider:   You may see Kirk Ruths, MD or one of the following Advanced Practice Providers on your designated Care Team:    Kerin Ransom, PA-C  [place], [place]  Coletta Memos, [place]    Other Instructions Please call our office 2 months in advance to schedule your follow-up appointment with Dr. Stanford Breed.

## 2019-10-23 ENCOUNTER — Other Ambulatory Visit: Payer: Self-pay | Admitting: Cardiology

## 2019-11-10 ENCOUNTER — Ambulatory Visit (INDEPENDENT_AMBULATORY_CARE_PROVIDER_SITE_OTHER): Payer: Managed Care, Other (non HMO)

## 2019-11-10 ENCOUNTER — Other Ambulatory Visit: Payer: Self-pay

## 2019-11-10 DIAGNOSIS — Z5181 Encounter for therapeutic drug level monitoring: Secondary | ICD-10-CM

## 2019-11-10 DIAGNOSIS — I059 Rheumatic mitral valve disease, unspecified: Secondary | ICD-10-CM | POA: Diagnosis not present

## 2019-11-10 DIAGNOSIS — Z7901 Long term (current) use of anticoagulants: Secondary | ICD-10-CM | POA: Diagnosis not present

## 2019-11-10 DIAGNOSIS — Z954 Presence of other heart-valve replacement: Secondary | ICD-10-CM

## 2019-11-10 LAB — POCT INR: INR: 2.6 (ref 2.0–3.0)

## 2019-11-10 NOTE — Patient Instructions (Signed)
Continue with 1 tablet daily.  Repeat INR in 6 weeks.    

## 2019-11-20 ENCOUNTER — Other Ambulatory Visit: Payer: Self-pay | Admitting: Cardiology

## 2019-12-16 ENCOUNTER — Other Ambulatory Visit: Payer: Self-pay | Admitting: Family Medicine

## 2019-12-16 DIAGNOSIS — E559 Vitamin D deficiency, unspecified: Secondary | ICD-10-CM

## 2019-12-22 ENCOUNTER — Ambulatory Visit (INDEPENDENT_AMBULATORY_CARE_PROVIDER_SITE_OTHER): Payer: Managed Care, Other (non HMO) | Admitting: Pharmacist

## 2019-12-22 ENCOUNTER — Other Ambulatory Visit: Payer: Self-pay | Admitting: Cardiology

## 2019-12-22 ENCOUNTER — Other Ambulatory Visit: Payer: Self-pay

## 2019-12-22 DIAGNOSIS — Z7901 Long term (current) use of anticoagulants: Secondary | ICD-10-CM | POA: Diagnosis not present

## 2019-12-22 DIAGNOSIS — I059 Rheumatic mitral valve disease, unspecified: Secondary | ICD-10-CM

## 2019-12-22 DIAGNOSIS — Z954 Presence of other heart-valve replacement: Secondary | ICD-10-CM

## 2019-12-22 LAB — POCT INR: INR: 3.3 — AB (ref 2.0–3.0)

## 2020-01-22 ENCOUNTER — Other Ambulatory Visit: Payer: Self-pay | Admitting: Cardiology

## 2020-02-02 ENCOUNTER — Other Ambulatory Visit: Payer: Self-pay

## 2020-02-02 ENCOUNTER — Ambulatory Visit (INDEPENDENT_AMBULATORY_CARE_PROVIDER_SITE_OTHER): Payer: Managed Care, Other (non HMO)

## 2020-02-02 DIAGNOSIS — Z7901 Long term (current) use of anticoagulants: Secondary | ICD-10-CM

## 2020-02-02 DIAGNOSIS — Z954 Presence of other heart-valve replacement: Secondary | ICD-10-CM

## 2020-02-02 DIAGNOSIS — Z5181 Encounter for therapeutic drug level monitoring: Secondary | ICD-10-CM

## 2020-02-02 DIAGNOSIS — I059 Rheumatic mitral valve disease, unspecified: Secondary | ICD-10-CM

## 2020-02-02 LAB — POCT INR: INR: 2.6 (ref 2.0–3.0)

## 2020-02-02 NOTE — Patient Instructions (Signed)
Continue with 1 tablet daily.  Repeat INR in 6 weeks.    

## 2020-02-27 ENCOUNTER — Other Ambulatory Visit: Payer: Self-pay | Admitting: Cardiology

## 2020-03-15 ENCOUNTER — Ambulatory Visit (INDEPENDENT_AMBULATORY_CARE_PROVIDER_SITE_OTHER)
Admission: RE | Admit: 2020-03-15 | Discharge: 2020-03-15 | Disposition: A | Discharge status: Home / Self Care | Payer: Managed Care, Other (non HMO) | Source: Ambulatory Visit | Attending: Student | Admitting: Student

## 2020-03-15 ENCOUNTER — Ambulatory Visit (INDEPENDENT_AMBULATORY_CARE_PROVIDER_SITE_OTHER): Payer: Managed Care, Other (non HMO) | Admitting: *Deleted

## 2020-03-15 ENCOUNTER — Other Ambulatory Visit: Payer: Self-pay

## 2020-03-15 DIAGNOSIS — Z954 Presence of other heart-valve replacement: Secondary | ICD-10-CM | POA: Diagnosis not present

## 2020-03-15 DIAGNOSIS — R911 Solitary pulmonary nodule: Secondary | ICD-10-CM

## 2020-03-15 DIAGNOSIS — Z7901 Long term (current) use of anticoagulants: Secondary | ICD-10-CM

## 2020-03-15 DIAGNOSIS — I059 Rheumatic mitral valve disease, unspecified: Secondary | ICD-10-CM

## 2020-03-15 LAB — POCT INR: INR: 3.5 — AB (ref 2.0–3.0)

## 2020-03-15 NOTE — Patient Instructions (Signed)
Description   Continue taking Warfarin 1 tablet daily. Repeat INR in 6 weeks. [facility] [phone]

## 2020-03-25 ENCOUNTER — Other Ambulatory Visit: Payer: Self-pay | Admitting: Cardiology

## 2020-03-25 ENCOUNTER — Other Ambulatory Visit: Payer: Self-pay | Admitting: Student

## 2020-03-28 NOTE — Telephone Encounter (Signed)
This is Dr. Crenshaw's pt 

## 2020-04-23 ENCOUNTER — Other Ambulatory Visit: Payer: Self-pay | Admitting: Cardiology

## 2020-04-26 ENCOUNTER — Other Ambulatory Visit: Payer: Self-pay

## 2020-04-26 ENCOUNTER — Ambulatory Visit (INDEPENDENT_AMBULATORY_CARE_PROVIDER_SITE_OTHER): Payer: Managed Care, Other (non HMO)

## 2020-04-26 DIAGNOSIS — I059 Rheumatic mitral valve disease, unspecified: Secondary | ICD-10-CM

## 2020-04-26 DIAGNOSIS — Z954 Presence of other heart-valve replacement: Secondary | ICD-10-CM

## 2020-04-26 DIAGNOSIS — Z7901 Long term (current) use of anticoagulants: Secondary | ICD-10-CM | POA: Diagnosis not present

## 2020-04-26 LAB — POCT INR: INR: 2.8 (ref 2.0–3.0)

## 2020-04-26 NOTE — Patient Instructions (Signed)
Continue taking Warfarin 1 tablet daily. Repeat INR in 6 weeks. [employer] [phone]

## 2020-05-09 ENCOUNTER — Other Ambulatory Visit: Payer: Self-pay | Admitting: Cardiology

## 2020-06-07 ENCOUNTER — Telehealth: Payer: Self-pay

## 2020-06-07 NOTE — Telephone Encounter (Signed)
m for overdue inr

## 2020-06-20 ENCOUNTER — Telehealth: Payer: Self-pay

## 2020-06-20 NOTE — Telephone Encounter (Signed)
lmom for overdue inr 

## 2020-06-26 ENCOUNTER — Ambulatory Visit (INDEPENDENT_AMBULATORY_CARE_PROVIDER_SITE_OTHER): Payer: Managed Care, Other (non HMO)

## 2020-06-26 ENCOUNTER — Other Ambulatory Visit: Payer: Self-pay

## 2020-06-26 DIAGNOSIS — I059 Rheumatic mitral valve disease, unspecified: Secondary | ICD-10-CM | POA: Diagnosis not present

## 2020-06-26 DIAGNOSIS — Z7901 Long term (current) use of anticoagulants: Secondary | ICD-10-CM

## 2020-06-26 DIAGNOSIS — Z954 Presence of other heart-valve replacement: Secondary | ICD-10-CM | POA: Diagnosis not present

## 2020-06-26 LAB — POCT INR: INR: 2.2 (ref 2.0–3.0)

## 2020-06-26 NOTE — Patient Instructions (Signed)
Take 2 tablets tonight only and then Continue taking Warfarin 1 tablet daily. Repeat INR in 6 weeks. [facility] [phone]

## 2020-08-02 ENCOUNTER — Other Ambulatory Visit: Payer: Self-pay | Admitting: Cardiology

## 2020-08-11 ENCOUNTER — Other Ambulatory Visit: Payer: Self-pay

## 2020-08-11 ENCOUNTER — Ambulatory Visit (INDEPENDENT_AMBULATORY_CARE_PROVIDER_SITE_OTHER): Payer: Managed Care, Other (non HMO)

## 2020-08-11 DIAGNOSIS — Z954 Presence of other heart-valve replacement: Secondary | ICD-10-CM | POA: Diagnosis not present

## 2020-08-11 DIAGNOSIS — I059 Rheumatic mitral valve disease, unspecified: Secondary | ICD-10-CM | POA: Diagnosis not present

## 2020-08-11 DIAGNOSIS — Z7901 Long term (current) use of anticoagulants: Secondary | ICD-10-CM

## 2020-08-11 LAB — POCT INR: INR: 2.1 (ref 2.0–3.0)

## 2020-08-11 NOTE — Patient Instructions (Signed)
Take 2 tablets tonight only and then increase Warfarin to 1 tablet daily except 1.5 tablets on Wednesday. Repeat INR in 6 weeks. [facility] [phone]

## 2020-09-07 ENCOUNTER — Telehealth: Payer: Self-pay | Admitting: Cardiology

## 2020-09-07 NOTE — Telephone Encounter (Signed)
Patient called to say that he is feeling discomfort in his chest, night sweats, and low INR below 2.5. Please advise

## 2020-09-07 NOTE — Telephone Encounter (Signed)
Left message to call back  

## 2020-09-13 NOTE — Telephone Encounter (Signed)
Attempted to call patient on home and cell with voice mail messages left on both numbers for patient to call the office.

## 2020-09-20 NOTE — Telephone Encounter (Signed)
Follow up scheduled 10/12/20

## 2020-09-27 ENCOUNTER — Ambulatory Visit (INDEPENDENT_AMBULATORY_CARE_PROVIDER_SITE_OTHER): Payer: Managed Care, Other (non HMO)

## 2020-09-27 ENCOUNTER — Other Ambulatory Visit: Payer: Self-pay

## 2020-09-27 DIAGNOSIS — Z7901 Long term (current) use of anticoagulants: Secondary | ICD-10-CM

## 2020-09-27 DIAGNOSIS — I059 Rheumatic mitral valve disease, unspecified: Secondary | ICD-10-CM | POA: Diagnosis not present

## 2020-09-27 DIAGNOSIS — Z954 Presence of other heart-valve replacement: Secondary | ICD-10-CM

## 2020-09-27 LAB — POCT INR: INR: 3 (ref 2.0–3.0)

## 2020-09-27 NOTE — Patient Instructions (Signed)
Continue taking 1 tablet daily except 1.5 tablets on Wednesday. Repeat INR in 6 weeks. [facility] [phone]

## 2020-10-12 ENCOUNTER — Ambulatory Visit: Payer: Managed Care, Other (non HMO) | Admitting: Physician Assistant

## 2020-11-05 ENCOUNTER — Other Ambulatory Visit: Payer: Self-pay | Admitting: Cardiology

## 2020-11-13 ENCOUNTER — Ambulatory Visit (INDEPENDENT_AMBULATORY_CARE_PROVIDER_SITE_OTHER): Payer: Self-pay

## 2020-11-13 ENCOUNTER — Other Ambulatory Visit: Payer: Self-pay

## 2020-11-13 DIAGNOSIS — Z5181 Encounter for therapeutic drug level monitoring: Secondary | ICD-10-CM

## 2020-11-13 DIAGNOSIS — Z7901 Long term (current) use of anticoagulants: Secondary | ICD-10-CM

## 2020-11-13 DIAGNOSIS — Z954 Presence of other heart-valve replacement: Secondary | ICD-10-CM

## 2020-11-13 DIAGNOSIS — I059 Rheumatic mitral valve disease, unspecified: Secondary | ICD-10-CM

## 2020-11-13 LAB — POCT INR: INR: 2.4 (ref 2.0–3.0)

## 2020-11-13 NOTE — Patient Instructions (Signed)
Take 1.5 tablets tonight only and then Continue taking 1 tablet daily except 1.5 tablets on Wednesday. Repeat INR in 6 weeks. [facility] [phone]

## 2020-12-25 ENCOUNTER — Other Ambulatory Visit: Payer: Self-pay

## 2020-12-25 ENCOUNTER — Ambulatory Visit (INDEPENDENT_AMBULATORY_CARE_PROVIDER_SITE_OTHER): Payer: Managed Care, Other (non HMO)

## 2020-12-25 DIAGNOSIS — I059 Rheumatic mitral valve disease, unspecified: Secondary | ICD-10-CM | POA: Diagnosis not present

## 2020-12-25 DIAGNOSIS — Z954 Presence of other heart-valve replacement: Secondary | ICD-10-CM

## 2020-12-25 DIAGNOSIS — Z7901 Long term (current) use of anticoagulants: Secondary | ICD-10-CM | POA: Diagnosis not present

## 2020-12-25 LAB — POCT INR: INR: 2.9 (ref 2.0–3.0)

## 2020-12-25 NOTE — Patient Instructions (Signed)
Continue taking 1 tablet daily except 1.5 tablets on Wednesday. Repeat INR in 6 weeks. [facility] [phone]

## 2021-01-02 ENCOUNTER — Other Ambulatory Visit: Payer: Self-pay | Admitting: Cardiology

## 2021-01-17 ENCOUNTER — Ambulatory Visit (HOSPITAL_COMMUNITY)
Admission: EM | Admit: 2021-01-17 | Discharge: 2021-01-17 | Disposition: A | Discharge status: Home / Self Care | Payer: Managed Care, Other (non HMO) | Attending: Family Medicine | Admitting: Family Medicine

## 2021-01-17 ENCOUNTER — Encounter (HOSPITAL_COMMUNITY): Payer: Self-pay

## 2021-01-17 ENCOUNTER — Other Ambulatory Visit: Payer: Self-pay

## 2021-01-17 DIAGNOSIS — M25571 Pain in right ankle and joints of right foot: Secondary | ICD-10-CM

## 2021-01-17 MED ORDER — DICLOFENAC SODIUM 75 MG PO TBEC: 75.0000 mg | DELAYED_RELEASE_TABLET | Freq: Two times a day (BID) | ORAL | 0 refills | Status: DC

## 2021-01-17 NOTE — ED Provider Notes (Signed)
[place]   [id number] 01/17/21 Arrival Time: 1026  ASSESSMENT & PLAN:  1. Acute right ankle pain    No indication for imaging. Rest. Ice. WBAT. Work note provided. Begin: Meds ordered this encounter  Medications   diclofenac (VOLTAREN) 75 MG EC tablet    Sig: Take 1 tablet (75 mg total) by mouth 2 (two) times daily.    Dispense:  14 tablet    Refill:  0    Recommend:  Follow-up Information     [place].   Why: If worsening or failing to improve as anticipated. Contact information: [address] [place] [phone]               Reviewed expectations re: course of current medical issues. Questions answered. Outlined signs and symptoms indicating need for more acute intervention. Patient verbalized understanding. After Visit Summary given.  SUBJECTIVE: History from: patient. David [name] is a 60 y.o. male who reports R ankle pain; medial; with swelling; onset yesterday; no injury; on feet al day at work; h/o similar in past. No extremity sensation changes or weakness. No tx PTA.   Past Surgical History:  Procedure Laterality Date   CARDIAC CATHETERIZATION  03-15-2008  dr Lia Foyer   no sig. coronary disease/  normal LVF/  moderate pulmonary hypertension w/ elevated V wave   COLONOSCOPY  last one 12/ 2013   ESOPHAGOGASTRODUODENOSCOPY N/A 06/02/2014   Procedure: ESOPHAGOGASTRODUODENOSCOPY (EGD);  Surgeon: Jerene Bears, MD;  Location: [facility] ENDOSCOPY;  Service: Endoscopy;  Laterality: N/A;   LEFT AND RIGHT HEART CATHETERIZATION WITH CORONARY ANGIOGRAM N/A 08/10/2014   Procedure: LEFT AND RIGHT HEART CATHETERIZATION WITH CORONARY ANGIOGRAM;  Surgeon: Belva Crome, MD;  Location: [facility] CATH LAB;  Service: Cardiovascular;  Laterality: N/A;   EF 55%,  severe MR, severe pulm HTN,  40% LAD   MITRAL VALVE REPAIR  03-22-2008   via  right mini thoracotomy   MITRAL VALVE REPAIR N/A 08/11/2014    Procedure: REDO MITRAL VALVE (MV) REPLACEMENT;  Surgeon: Rexene Alberts, MD;  Location: [place];  Service: Open Heart Surgery;  Laterality: N/A;  NO NECK LINES ON RIGHT SIDE   MULTIPLE TOOTH EXTRACTIONS  03-16-2008    w/ alveoloplasty   PROSTATE BIOPSY  03/2014   RADIOACTIVE SEED IMPLANT N/A 03/17/2015   Procedure: RADIOACTIVE SEED IMPLANT/BRACHYTHERAPY IMPLANT;  Surgeon: Ardis Hughs, MD;  Location: [facility];  Service: Urology;  Laterality: N/A;  DR PORTABLE   TEE WITHOUT CARDIOVERSION N/A 07/19/2014   Procedure: TRANSESOPHAGEAL ECHOCARDIOGRAM (TEE);  Surgeon: Lelon Perla, MD;  Location: [facility] ENDOSCOPY;  Service: Cardiovascular;  Laterality: N/A;   TEE WITHOUT CARDIOVERSION N/A 08/11/2014   Procedure: TRANSESOPHAGEAL ECHOCARDIOGRAM (TEE);  Surgeon: Rexene Alberts, MD;  Location: [place];  Service: Open Heart Surgery;  Laterality: N/A;   TRANSTHORACIC ECHOCARDIOGRAM  09-23-2014   dr  Stanford Breed   mild focal basal LVH of the septum,  ef 45-50%,  normal wall motion/  mechanical bileaflet MV sits well in mitrial position, no sig. regurg., valve area 1.61 cm^2,  mean grandient 38mmHg/  mild LAE/  RA prominent Chiari malformation , unchanged from prior study, normal size/  mild TR/  mild to moderate pulmonic insuffiency      OBJECTIVE:  Vitals:   01/17/21 1130  BP: 138/89  Pulse: 74  Resp: 18  Temp: (!) 97.5 F (36.4 C)  TempSrc: Oral  SpO2: 98%  General appearance: alert; no distress HEENT: [place]; AT Neck: supple with FROM Resp: unlabored respirations Extremities: RLE: warm with well perfused appearance; well localized moderate tenderness over right medial ankle; without gross deformities; swelling: moderate; bruising: none; ankle ROM: normal, with discomfort; bunion deformity CV: brisk extremity capillary refill of RLE; 2+ DP pulse of RLE. Skin: warm and dry; no visible rashes Neurologic: gait normal; normal sensation and strength of RLE Psychological: alert and  cooperative; normal mood and affect     Allergies  Allergen Reactions   Clarithromycin Nausea Only    Past Medical History:  Diagnosis Date   DDD (degenerative disc disease), lumbosacral    Hemolytic anemia ([place]) 06/29/2014   History of bacterial endocarditis    07/ 2001  enterococcal//  recurrence  11/ 2009  steptococcus viridans   History of chest tube placement    1980's --- R SIDE  SPONTANEOUS  PNEUMOTHORAX   History of Clostridium difficile colitis    2009   History of periodontal disease    Prostate cancer [facility]) urologist--  dr herrick/  oncologist-- dr Tammi Klippel   Stage  T1c,  Gleason 3+4,  PSA 3.76,  vol 33.45cc   Pulmonary nodules followed by dr Stanford Breed   right upper & lower lobe and left lower lobe--  stable per CT 08-29-2014   S/P redo mitral valve replacement with metallic valve cardiologist--  dr Stanford Breed   08-11-2014--- 31 mm Sorin [employer]omedics Optiform bileaflet mechanical prosthesis   Thoracic radiculopathy    T7  (positional)  per PCP note--  pt doing yoga and swimming state has helpted   Wears glasses    Wears partial dentures    upper   Social History   Socioeconomic History   Marital status: Married    Spouse name: Not on file   Number of children: 3   Years of education: 12   Highest education level: Not on file  Occupational History   Occupation: [employer]    Employer: [employer]    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  Tobacco Use   Smoking status: Former    Packs/day: 2.00    Years: 15.00    Pack years: 30.00    Types: Cigarettes    Quit date: 08/08/1989    Years since quitting: 31.4   Smokeless tobacco: Never  Substance and Sexual Activity   Alcohol use: No    Alcohol/week: 0.0 standard drinks    Comment: "stopped drinking in 1991   Drug use: No    Types: Cocaine, Heroin, Marijuana    Comment: "stopped using drugs in ~ 1984"   Sexual activity: Not on file  Other Topics Concern   Not on file  Social History  Narrative   REGULAR EXERCISE 2X WK TREADMILL,WEIGHTS   Social Determinants of Health   Financial Resource Strain: Not on file  Food Insecurity: Not on file  Transportation Needs: Not on file  Physical Activity: Not on file  Stress: Not on file  Social Connections: Not on file   Family History  Problem Relation Age of Onset   Ovarian cancer Sister    Alzheimer's disease Father    Diabetes Neg Hx    Heart disease Neg Hx    Stroke Neg Hx    Past Surgical History:  Procedure Laterality Date   CARDIAC CATHETERIZATION  03-15-2008  dr Lia Foyer   no sig. coronary disease/  normal LVF/  moderate pulmonary hypertension w/ elevated V wave   COLONOSCOPY  last one 12/ 2013  ESOPHAGOGASTRODUODENOSCOPY N/A 06/02/2014   Procedure: ESOPHAGOGASTRODUODENOSCOPY (EGD);  Surgeon: Jerene Bears, MD;  Location: [facility] ENDOSCOPY;  Service: Endoscopy;  Laterality: N/A;   LEFT AND RIGHT HEART CATHETERIZATION WITH CORONARY ANGIOGRAM N/A 08/10/2014   Procedure: LEFT AND RIGHT HEART CATHETERIZATION WITH CORONARY ANGIOGRAM;  Surgeon: Belva Crome, MD;  Location: [facility] CATH LAB;  Service: Cardiovascular;  Laterality: N/A;   EF 55%,  severe MR, severe pulm HTN,  40% LAD   MITRAL VALVE REPAIR  03-22-2008   via  right mini thoracotomy   MITRAL VALVE REPAIR N/A 08/11/2014   Procedure: REDO MITRAL VALVE (MV) REPLACEMENT;  Surgeon: Rexene Alberts, MD;  Location: [place];  Service: Open Heart Surgery;  Laterality: N/A;  NO NECK LINES ON RIGHT SIDE   MULTIPLE TOOTH EXTRACTIONS  03-16-2008    w/ alveoloplasty   PROSTATE BIOPSY  03/2014   RADIOACTIVE SEED IMPLANT N/A 03/17/2015   Procedure: RADIOACTIVE SEED IMPLANT/BRACHYTHERAPY IMPLANT;  Surgeon: Ardis Hughs, MD;  Location: [facility];  Service: Urology;  Laterality: N/A;  DR PORTABLE   TEE WITHOUT CARDIOVERSION N/A 07/19/2014   Procedure: TRANSESOPHAGEAL ECHOCARDIOGRAM (TEE);  Surgeon: Lelon Perla, MD;  Location: [facility] ENDOSCOPY;  Service: Cardiovascular;   Laterality: N/A;   TEE WITHOUT CARDIOVERSION N/A 08/11/2014   Procedure: TRANSESOPHAGEAL ECHOCARDIOGRAM (TEE);  Surgeon: Rexene Alberts, MD;  Location: [place];  Service: Open Heart Surgery;  Laterality: N/A;   TRANSTHORACIC ECHOCARDIOGRAM  09-23-2014   dr  Stanford Breed   mild focal basal LVH of the septum,  ef 45-50%,  normal wall motion/  mechanical bileaflet MV sits well in mitrial position, no sig. regurg., valve area 1.61 cm^2,  mean grandient 53mmHg/  mild LAE/  RA prominent Chiari malformation , unchanged from prior study, normal size/  mild TR/  mild to moderate pulmonic insuffiency       Vanessa Kick, MD 01/17/21 1309

## 2021-01-17 NOTE — ED Triage Notes (Signed)
Pt c/o lt ankle pain and swelling since yesterday. Denies injury.

## 2021-02-05 ENCOUNTER — Ambulatory Visit (INDEPENDENT_AMBULATORY_CARE_PROVIDER_SITE_OTHER): Payer: Managed Care, Other (non HMO)

## 2021-02-05 ENCOUNTER — Other Ambulatory Visit: Payer: Self-pay

## 2021-02-05 DIAGNOSIS — I059 Rheumatic mitral valve disease, unspecified: Secondary | ICD-10-CM | POA: Diagnosis not present

## 2021-02-05 DIAGNOSIS — Z7901 Long term (current) use of anticoagulants: Secondary | ICD-10-CM | POA: Diagnosis not present

## 2021-02-05 DIAGNOSIS — Z954 Presence of other heart-valve replacement: Secondary | ICD-10-CM

## 2021-02-05 LAB — POCT INR: INR: 3.3 — AB (ref 2.0–3.0)

## 2021-02-05 NOTE — Patient Instructions (Signed)
Continue taking 1 tablet daily except 1.5 tablets on Wednesday. Repeat INR in 6 weeks. [facility] [phone]

## 2021-02-11 ENCOUNTER — Other Ambulatory Visit: Payer: Self-pay | Admitting: Cardiology

## 2021-03-26 ENCOUNTER — Ambulatory Visit (INDEPENDENT_AMBULATORY_CARE_PROVIDER_SITE_OTHER): Payer: Managed Care, Other (non HMO)

## 2021-03-26 ENCOUNTER — Other Ambulatory Visit: Payer: Self-pay

## 2021-03-26 DIAGNOSIS — Z7901 Long term (current) use of anticoagulants: Secondary | ICD-10-CM

## 2021-03-26 DIAGNOSIS — Z954 Presence of other heart-valve replacement: Secondary | ICD-10-CM

## 2021-03-26 DIAGNOSIS — I059 Rheumatic mitral valve disease, unspecified: Secondary | ICD-10-CM

## 2021-03-26 LAB — POCT INR: INR: 4.1 — AB (ref 2.0–3.0)

## 2021-03-26 NOTE — Patient Instructions (Signed)
HOLD tonight only and then Continue taking 1 tablet daily except 1.5 tablets on Wednesday. Repeat INR in 4 weeks. [facility] [phone]

## 2021-04-25 ENCOUNTER — Ambulatory Visit (INDEPENDENT_AMBULATORY_CARE_PROVIDER_SITE_OTHER): Payer: Managed Care, Other (non HMO)

## 2021-04-25 ENCOUNTER — Other Ambulatory Visit: Payer: Self-pay

## 2021-04-25 DIAGNOSIS — Z7901 Long term (current) use of anticoagulants: Secondary | ICD-10-CM

## 2021-04-25 DIAGNOSIS — I059 Rheumatic mitral valve disease, unspecified: Secondary | ICD-10-CM

## 2021-04-25 DIAGNOSIS — Z5181 Encounter for therapeutic drug level monitoring: Secondary | ICD-10-CM

## 2021-04-25 DIAGNOSIS — Z954 Presence of other heart-valve replacement: Secondary | ICD-10-CM

## 2021-04-25 LAB — POCT INR: INR: 3.9 — AB (ref 2.0–3.0)

## 2021-04-25 NOTE — Patient Instructions (Signed)
Description   Only take 0.5 tablet tonight and then START taking 1 tablet daily. Repeat INR in 3 weeks. [facility] [phone]

## 2021-05-11 ENCOUNTER — Other Ambulatory Visit: Payer: Self-pay | Admitting: Cardiology

## 2021-05-16 ENCOUNTER — Ambulatory Visit (INDEPENDENT_AMBULATORY_CARE_PROVIDER_SITE_OTHER): Payer: Managed Care, Other (non HMO)

## 2021-05-16 ENCOUNTER — Other Ambulatory Visit: Payer: Self-pay

## 2021-05-16 DIAGNOSIS — I059 Rheumatic mitral valve disease, unspecified: Secondary | ICD-10-CM

## 2021-05-16 DIAGNOSIS — Z954 Presence of other heart-valve replacement: Secondary | ICD-10-CM

## 2021-05-16 DIAGNOSIS — Z7901 Long term (current) use of anticoagulants: Secondary | ICD-10-CM | POA: Diagnosis not present

## 2021-05-16 LAB — POCT INR: INR: 2.1 (ref 2.0–3.0)

## 2021-05-16 NOTE — Patient Instructions (Signed)
TAKE 1.5 TABLETS TONIGHT AND THEN CONTINUE  taking 1 tablet daily. Repeat INR in 4 weeks. [facility] [phone]

## 2021-06-11 ENCOUNTER — Ambulatory Visit (INDEPENDENT_AMBULATORY_CARE_PROVIDER_SITE_OTHER): Payer: Managed Care, Other (non HMO)

## 2021-06-11 ENCOUNTER — Other Ambulatory Visit: Payer: Self-pay

## 2021-06-11 DIAGNOSIS — Z7901 Long term (current) use of anticoagulants: Secondary | ICD-10-CM

## 2021-06-11 DIAGNOSIS — I059 Rheumatic mitral valve disease, unspecified: Secondary | ICD-10-CM

## 2021-06-11 DIAGNOSIS — Z954 Presence of other heart-valve replacement: Secondary | ICD-10-CM

## 2021-06-11 LAB — POCT INR: INR: 3 (ref 2.0–3.0)

## 2021-06-11 NOTE — Patient Instructions (Signed)
CONTINUE  taking 1 tablet daily. Repeat INR in 6 weeks. [facility] [phone]

## 2021-07-23 ENCOUNTER — Telehealth: Payer: Self-pay

## 2021-07-23 NOTE — Telephone Encounter (Signed)
Lmom to R/s missed appt for coumadin ?

## 2021-07-25 ENCOUNTER — Telehealth: Payer: Self-pay

## 2021-07-25 NOTE — Telephone Encounter (Signed)
Lpmtcb and reschedule INR check 

## 2021-07-30 ENCOUNTER — Telehealth: Payer: Self-pay

## 2021-07-30 NOTE — Telephone Encounter (Signed)
Lpmtcb and schedule INR check 

## 2021-08-03 ENCOUNTER — Ambulatory Visit (INDEPENDENT_AMBULATORY_CARE_PROVIDER_SITE_OTHER): Payer: Managed Care, Other (non HMO)

## 2021-08-03 DIAGNOSIS — Z954 Presence of other heart-valve replacement: Secondary | ICD-10-CM

## 2021-08-03 DIAGNOSIS — Z5181 Encounter for therapeutic drug level monitoring: Secondary | ICD-10-CM

## 2021-08-03 DIAGNOSIS — Z7901 Long term (current) use of anticoagulants: Secondary | ICD-10-CM

## 2021-08-03 DIAGNOSIS — I059 Rheumatic mitral valve disease, unspecified: Secondary | ICD-10-CM

## 2021-08-03 LAB — POCT INR: INR: 5.6 — AB (ref 2.0–3.0)

## 2021-08-03 NOTE — Patient Instructions (Signed)
HOLD TONIGHT, Saturday and Sunday and then continue taking 1 tablet daily. Repeat INR in 2 weeks. [facility] [phone] ?

## 2021-08-10 ENCOUNTER — Other Ambulatory Visit: Payer: Self-pay | Admitting: Cardiology

## 2021-08-17 ENCOUNTER — Ambulatory Visit (INDEPENDENT_AMBULATORY_CARE_PROVIDER_SITE_OTHER): Payer: Managed Care, Other (non HMO)

## 2021-08-17 DIAGNOSIS — I059 Rheumatic mitral valve disease, unspecified: Secondary | ICD-10-CM

## 2021-08-17 DIAGNOSIS — Z7901 Long term (current) use of anticoagulants: Secondary | ICD-10-CM | POA: Diagnosis not present

## 2021-08-17 DIAGNOSIS — Z954 Presence of other heart-valve replacement: Secondary | ICD-10-CM | POA: Diagnosis not present

## 2021-08-17 LAB — POCT INR: INR: 3.6 — AB (ref 2.0–3.0)

## 2021-08-17 NOTE — Patient Instructions (Signed)
Decrease to 1 tablet daily, except 0.5 tablet Wednesday. Repeat INR in 3 weeks. [facility] [phone] ?

## 2021-09-06 ENCOUNTER — Ambulatory Visit (INDEPENDENT_AMBULATORY_CARE_PROVIDER_SITE_OTHER): Payer: Managed Care, Other (non HMO)

## 2021-09-06 DIAGNOSIS — Z7901 Long term (current) use of anticoagulants: Secondary | ICD-10-CM

## 2021-09-06 DIAGNOSIS — Z954 Presence of other heart-valve replacement: Secondary | ICD-10-CM

## 2021-09-06 DIAGNOSIS — I059 Rheumatic mitral valve disease, unspecified: Secondary | ICD-10-CM

## 2021-09-06 LAB — POCT INR: INR: 2.9 (ref 2.0–3.0)

## 2021-09-06 NOTE — Patient Instructions (Signed)
Continue 1 tablet daily, except 0.5 tablet Wednesday. Repeat INR in 6 weeks. [facility] [phone] ?

## 2021-10-11 ENCOUNTER — Other Ambulatory Visit: Payer: Self-pay | Admitting: Cardiology

## 2021-10-19 ENCOUNTER — Ambulatory Visit (INDEPENDENT_AMBULATORY_CARE_PROVIDER_SITE_OTHER): Payer: Managed Care, Other (non HMO)

## 2021-10-19 DIAGNOSIS — Z954 Presence of other heart-valve replacement: Secondary | ICD-10-CM | POA: Diagnosis not present

## 2021-10-19 DIAGNOSIS — Z7901 Long term (current) use of anticoagulants: Secondary | ICD-10-CM

## 2021-10-19 DIAGNOSIS — I059 Rheumatic mitral valve disease, unspecified: Secondary | ICD-10-CM

## 2021-10-19 LAB — POCT INR: INR: 2.4 (ref 2.0–3.0)

## 2021-10-29 ENCOUNTER — Other Ambulatory Visit: Payer: Self-pay | Admitting: Cardiology

## 2021-11-07 ENCOUNTER — Other Ambulatory Visit: Payer: Self-pay | Admitting: Cardiology

## 2021-11-11 ENCOUNTER — Other Ambulatory Visit: Payer: Self-pay | Admitting: Cardiology

## 2021-12-06 ENCOUNTER — Ambulatory Visit (HOSPITAL_COMMUNITY)
Admission: EM | Admit: 2021-12-06 | Discharge: 2021-12-06 | Disposition: A | Discharge status: Home / Self Care | Payer: Managed Care, Other (non HMO) | Attending: Internal Medicine | Admitting: Internal Medicine

## 2021-12-06 ENCOUNTER — Encounter (HOSPITAL_COMMUNITY): Payer: Self-pay | Admitting: *Deleted

## 2021-12-06 DIAGNOSIS — K047 Periapical abscess without sinus: Secondary | ICD-10-CM | POA: Diagnosis not present

## 2021-12-06 MED ORDER — CHLORHEXIDINE GLUCONATE 0.12 % MT SOLN: 15.0000 mL | Freq: Two times a day (BID) | OROMUCOSAL | 0 refills | Status: DC

## 2021-12-06 MED ORDER — AMOXICILLIN-POT CLAVULANATE 875-125 MG PO TABS: 1.0000 | ORAL_TABLET | Freq: Two times a day (BID) | ORAL | 0 refills | Status: DC

## 2021-12-06 NOTE — Discharge Instructions (Addendum)
Please take medications as prescribed Please use oral rinse and Orajel for tooth ache Return to urgent care if you have any other concerns.

## 2021-12-06 NOTE — ED Triage Notes (Signed)
C/O night sweats x 3-4 nights. Reports having had endocarditis 2001 & 2009; states having the night sweats is similar to when he had endocarditis. States he is not having the fatigue he experienced with endocarditis is past. Has regular f/u with cardiologist.  X 1 month intermittent left upper dental pain.

## 2021-12-06 NOTE — ED Provider Notes (Addendum)
[place]    CSN: [id number] Arrival date & time: 12/06/21  1644      History   Chief Complaint Chief Complaint  Patient presents with   Dental Pain   Night Sweats    Hx endocarditis    HPI David [name] is a 61 y.o. male with a history of dental caries comes with 3 to 4-day history of dental pain associated with some night sweats and chills.  No gum swelling.  No nausea or vomiting.  No chest pain or chest pressure.  Patient has had intermittent tooth pain for the past month.  Night sweats is without fatigue.  Patient has a remote history of bacterial endocarditis status post mitral valve replacement.   HPI  Past Medical History:  Diagnosis Date   DDD (degenerative disc disease), lumbosacral    Hemolytic anemia ([place]) 06/29/2014   History of bacterial endocarditis    07/ 2001  enterococcal//  recurrence  11/ 2009  steptococcus viridans   History of chest tube placement    1980's --- R SIDE  SPONTANEOUS  PNEUMOTHORAX   History of Clostridium difficile colitis    2009   History of periodontal disease    Prostate cancer [facility]) urologist--  dr herrick/  oncologist-- dr Tammi Klippel   Stage  T1c,  Gleason 3+4,  PSA 3.76,  vol 33.45cc   Pulmonary nodules followed by dr Stanford Breed   right upper & lower lobe and left lower lobe--  stable per CT 08-29-2014   S/P redo mitral valve replacement with metallic valve cardiologist--  dr Stanford Breed   08-11-2014--- 31 mm Sorin [employer]omedics Optiform bileaflet mechanical prosthesis   Thoracic radiculopathy    T7  (positional)  per PCP note--  pt doing yoga and swimming state has helpted   Wears glasses    Wears partial dentures    upper    Patient Active Problem List   Diagnosis Date Noted   Epidermal inclusion cyst 03/11/2018   Neutropenia ([place]) 08/26/2017   Healthcare maintenance 03/08/2016   Strain of lumbar paraspinal muscle 08/01/2015   Left knee pain 08/01/2015   Long term current use of anticoagulant therapy 08/19/2014    S/P redo mitral valve replacement with metallic valve 40/98/1191   Mitral valve regurgitation 08/09/2014   Severe mitral regurgitation 08/03/2014   Hx of mitral valve repair 08/03/2014   Mitral regurgitation 07/19/2014   Hemolytic anemia ([place]) 06/29/2014   Cystitis 06/23/2014   Malignant neoplasm of prostate ([place]) 06/23/2014   Nodule of right lung 06/23/2014   Symptomatic anemia 06/21/2014   Prostate cancer ([place]) 06/07/2014   Microcytic anemia    Hyperlipidemia 05/31/2014   Nonspecific abnormal electrocardiogram (ECG) (EKG) 11/19/2011   HLD (hyperlipidemia) 01/12/2010   GERD 01/12/2010   HIATAL HERNIA 05/12/2009   Mitral valve disorder 06/29/2008    Past Surgical History:  Procedure Laterality Date   CARDIAC CATHETERIZATION  03-15-2008  dr Lia Foyer   no sig. coronary disease/  normal LVF/  moderate pulmonary hypertension w/ elevated V wave   COLONOSCOPY  last one 12/ 2013   ESOPHAGOGASTRODUODENOSCOPY N/A 06/02/2014   Procedure: ESOPHAGOGASTRODUODENOSCOPY (EGD);  Surgeon: Jerene Bears, MD;  Location: [facility] ENDOSCOPY;  Service: Endoscopy;  Laterality: N/A;   LEFT AND RIGHT HEART CATHETERIZATION WITH CORONARY ANGIOGRAM N/A 08/10/2014   Procedure: LEFT AND RIGHT HEART CATHETERIZATION WITH CORONARY ANGIOGRAM;  Surgeon: Belva Crome, MD;  Location: [facility] CATH LAB;  Service: Cardiovascular;  Laterality: N/A;   EF 55%,  severe MR, severe pulm  HTN,  40% LAD   MITRAL VALVE REPAIR  03-22-2008   via  right mini thoracotomy   MITRAL VALVE REPAIR N/A 08/11/2014   Procedure: REDO MITRAL VALVE (MV) REPLACEMENT;  Surgeon: Rexene Alberts, MD;  Location: [place];  Service: Open Heart Surgery;  Laterality: N/A;  NO NECK LINES ON RIGHT SIDE   MULTIPLE TOOTH EXTRACTIONS  03-16-2008    w/ alveoloplasty   PROSTATE BIOPSY  03/2014   RADIOACTIVE SEED IMPLANT N/A 03/17/2015   Procedure: RADIOACTIVE SEED IMPLANT/BRACHYTHERAPY IMPLANT;  Surgeon: Ardis Hughs, MD;  Location: [facility];  Service:  Urology;  Laterality: N/A;  DR PORTABLE   TEE WITHOUT CARDIOVERSION N/A 07/19/2014   Procedure: TRANSESOPHAGEAL ECHOCARDIOGRAM (TEE);  Surgeon: Lelon Perla, MD;  Location: [facility] ENDOSCOPY;  Service: Cardiovascular;  Laterality: N/A;   TEE WITHOUT CARDIOVERSION N/A 08/11/2014   Procedure: TRANSESOPHAGEAL ECHOCARDIOGRAM (TEE);  Surgeon: Rexene Alberts, MD;  Location: [place];  Service: Open Heart Surgery;  Laterality: N/A;   TRANSTHORACIC ECHOCARDIOGRAM  09-23-2014   dr  Stanford Breed   mild focal basal LVH of the septum,  ef 45-50%,  normal wall motion/  mechanical bileaflet MV sits well in mitrial position, no sig. regurg., valve area 1.61 cm^2,  mean grandient 22mHg/  mild LAE/  RA prominent Chiari malformation , unchanged from prior study, normal size/  mild TR/  mild to moderate pulmonic insuffiency       Home Medications    Prior to Admission medications   Medication Sig Start Date End Date Taking? Authorizing Provider  amoxicillin-clavulanate (AUGMENTIN) 875-125 MG tablet Take 1 tablet by mouth every 12 (twelve) hours. 12/06/21  Yes Nyomi Howser, PMyrene Galas MD  aspirin EC 81 MG EC tablet Take 1 tablet (81 mg total) by mouth daily. 08/17/14  Yes Barrett, Erin R, PA-C  carvedilol (COREG) 3.125 MG tablet Take 1 tablet (3.125 mg total) by mouth 2 (two) times daily with a meal. Keep the appointment for future refills. 11/13/21  Yes CLelon Perla MD  chlorhexidine (PERIDEX) 0.12 % solution Use as directed 15 mLs in the mouth or throat 2 (two) times daily. 12/06/21  Yes Ava Tangney, PMyrene Galas MD  co-enzyme Q-10 30 MG capsule Take 30 mg by mouth daily.   Yes [provider]  Multiple Minerals (JOINT HEALTH MINERAL PO) Take 1 tablet by mouth daily.   Yes [provider]  Multiple Vitamin (MULTIVITAMIN) tablet Take 1 tablet by mouth daily.   Yes [provider]  omega-3 acid ethyl esters (LOVAZA) 1 g capsule Take by mouth 2 (two) times daily.   Yes [provider]  OVER THE  COUNTER MEDICATION 1 tablet.   Yes [provider]  VITAMIN D PO Take by mouth.   Yes [provider]  warfarin (COUMADIN) 5 MG tablet Take 1 tablet by mouth once daily as prescribed by [facility] 11/07/21  Yes CLelon Perla MD  ranitidine (ZANTAC 75) 75 MG tablet Take 1 tablet (75 mg total) by mouth 2 (two) times daily. 03/13/18   KLibby Maw MD    Family History Family History  Problem Relation Age of Onset   Ovarian cancer Sister    Alzheimer's disease Father    Diabetes Neg Hx    Heart disease Neg Hx    Stroke Neg Hx     Social History Social History   Tobacco Use   Smoking status: Former    Packs/day: 2.00    Years: 15.00  Total pack years: 30.00    Types: Cigarettes    Quit date: 08/08/1989    Years since quitting: 32.3   Smokeless tobacco: Never  Vaping Use   Vaping Use: Never used  Substance Use Topics   Alcohol use: No   Drug use: No    Types: Cocaine, Heroin, Marijuana    Comment: "stopped using drugs in ~ 1984"     Allergies   Clarithromycin   Review of Systems Review of Systems  Constitutional:  Positive for chills. Negative for fever.  HENT:  Positive for dental problem.   Cardiovascular: Negative.   Genitourinary: Negative.      Physical Exam Triage Vital Signs ED Triage Vitals  Enc Vitals Group     BP 12/06/21 1733 (!) 148/88     Pulse Rate 12/06/21 1733 75     Resp 12/06/21 1733 18     Temp 12/06/21 1733 97.9 F (36.6 C)     Temp Source 12/06/21 1733 Oral     SpO2 12/06/21 1733 98 %     Weight --      Height --      Head Circumference --      Peak Flow --      Pain Score 12/06/21 1734 2     Pain Loc --      Pain Edu? --      Excl. in [place]? --    No data found.  Updated Vital Signs BP (!) 148/88   Pulse 75   Temp 97.9 F (36.6 C) (Oral)   Resp 18   SpO2 98%   Visual Acuity Right Eye Distance:   Left Eye Distance:   Bilateral Distance:    Right Eye Near:   Left Eye Near:     Bilateral Near:     Physical Exam Vitals and nursing note reviewed.  Constitutional:      General: He is not in acute distress.    Appearance: He is not ill-appearing.  HENT:     Right Ear: Tympanic membrane normal.     Left Ear: Tympanic membrane normal.     Mouth/Throat:     Mouth: Mucous membranes are moist.     Comments: Dental caries noted.  No dental cavities or gum swelling noted. Neurological:     Mental Status: He is alert.         UC Treatments / Results  Labs (all labs ordered are listed, but only abnormal results are displayed) Labs Reviewed - No data to display  EKG   Radiology No results found.  Procedures Procedures (including critical care time)  Medications Ordered in UC Medications - No data to display  Initial Impression / Assessment and Plan / UC Course  I have reviewed the triage vital signs and the nursing notes.  Pertinent labs & imaging results that were available during my care of the patient were reviewed by me and considered in my medical decision making (see chart for details).     1.  Dental infection: Continue Orajel use Chlorhexidine mouth rinse Augmentin twice daily for 7 days Follow-up with dentist for dental evaluation routine dental care. Final Clinical Impressions(s) / UC Diagnoses   Final diagnoses:  Dental infection     Discharge Instructions      Please take medications as prescribed Please use oral rinse and Orajel for tooth ache Return to urgent care if you have any other concerns.   ED Prescriptions     Medication Sig Dispense Auth.  Provider   amoxicillin-clavulanate (AUGMENTIN) 875-125 MG tablet Take 1 tablet by mouth every 12 (twelve) hours. 14 tablet Kagen Kunath, Myrene Galas, MD   chlorhexidine (PERIDEX) 0.12 % solution Use as directed 15 mLs in the mouth or throat 2 (two) times daily. 120 mL Mahlon Gabrielle, Myrene Galas, MD      PDMP not reviewed this encounter.   Chase Picket, MD 12/06/21 Dicky Doe, MD 12/06/21 Vernelle Emerald

## 2021-12-12 ENCOUNTER — Other Ambulatory Visit: Payer: Self-pay | Admitting: Cardiology

## 2022-01-01 ENCOUNTER — Other Ambulatory Visit: Payer: Self-pay | Admitting: Cardiology

## 2022-01-11 ENCOUNTER — Ambulatory Visit: Payer: Managed Care, Other (non HMO) | Attending: Internal Medicine | Admitting: *Deleted

## 2022-01-11 DIAGNOSIS — I059 Rheumatic mitral valve disease, unspecified: Secondary | ICD-10-CM

## 2022-01-11 DIAGNOSIS — Z7901 Long term (current) use of anticoagulants: Secondary | ICD-10-CM | POA: Diagnosis not present

## 2022-01-11 DIAGNOSIS — Z954 Presence of other heart-valve replacement: Secondary | ICD-10-CM

## 2022-01-11 LAB — POCT INR: INR: 2.1 (ref 2.0–3.0)

## 2022-01-11 NOTE — Patient Instructions (Signed)
Description   Take 1.5 tablets today and then start taking 1 tablet daily. Repeat INR in 4 weeks [facility] [phone]

## 2022-02-07 ENCOUNTER — Ambulatory Visit: Payer: Self-pay | Attending: Cardiology

## 2022-02-07 ENCOUNTER — Other Ambulatory Visit: Payer: Self-pay | Admitting: Cardiology

## 2022-02-15 ENCOUNTER — Other Ambulatory Visit: Payer: Self-pay | Admitting: Cardiology

## 2022-02-19 ENCOUNTER — Ambulatory Visit: Payer: Self-pay | Attending: Cardiology

## 2022-02-19 DIAGNOSIS — I059 Rheumatic mitral valve disease, unspecified: Secondary | ICD-10-CM

## 2022-02-19 DIAGNOSIS — Z954 Presence of other heart-valve replacement: Secondary | ICD-10-CM

## 2022-02-19 DIAGNOSIS — Z7901 Long term (current) use of anticoagulants: Secondary | ICD-10-CM

## 2022-02-19 LAB — POCT INR: INR: 2.6 (ref 2.0–3.0)

## 2022-02-19 NOTE — Patient Instructions (Signed)
Continue taking 1 tablet daily. Repeat INR in 6 weeks [facility] [phone]

## 2022-02-21 ENCOUNTER — Other Ambulatory Visit: Payer: Self-pay | Admitting: Cardiology

## 2022-02-21 DIAGNOSIS — Z954 Presence of other heart-valve replacement: Secondary | ICD-10-CM

## 2022-02-22 NOTE — Telephone Encounter (Signed)
Last INR 02/19/2022 Last OV 09/29/2019; note on the refill & will send a msg to schedulers

## 2022-03-07 ENCOUNTER — Ambulatory Visit
Admission: EM | Admit: 2022-03-07 | Discharge: 2022-03-07 | Disposition: A | Discharge status: Home / Self Care | Payer: Managed Care, Other (non HMO) | Attending: Internal Medicine | Admitting: Internal Medicine

## 2022-03-07 ENCOUNTER — Encounter: Payer: Self-pay | Admitting: Emergency Medicine

## 2022-03-07 ENCOUNTER — Encounter: Payer: Self-pay | Admitting: General Practice

## 2022-03-07 ENCOUNTER — Telehealth: Payer: Self-pay | Admitting: Cardiology

## 2022-03-07 ENCOUNTER — Other Ambulatory Visit: Payer: Self-pay

## 2022-03-07 DIAGNOSIS — L02212 Cutaneous abscess of back [any part, except buttock]: Secondary | ICD-10-CM | POA: Diagnosis not present

## 2022-03-07 MED ORDER — CEPHALEXIN 500 MG PO CAPS: 500.0000 mg | ORAL_CAPSULE | Freq: Four times a day (QID) | ORAL | 0 refills | Status: DC

## 2022-03-07 NOTE — Telephone Encounter (Signed)
Hemphill, Candance B, RN  P Cv Div Nl Scheduling Good morning,  This pt is overdue for a Cardiologist appt. He was last seen in 09/2019 by Sande Rives, [place]. Can you reach out to the pt for an appointment, please?  Thanks in advance, Candance  Patient contacted 3x with no success in scheduling, will send reminder letter to pt to call office.

## 2022-03-07 NOTE — ED Triage Notes (Signed)
Pt here for abscess to upper back x 3 weeks that is draining

## 2022-03-07 NOTE — ED Provider Notes (Signed)
[facility]    CSN: [id number] Arrival date & time: 03/07/22  0810      History   Chief Complaint Chief Complaint  Patient presents with   Abscess    HPI [name] is a 61 y.o. male.   Patient presents with abscess to upper back that has been present for about 3 weeks.  He states that his wife has been squeezing it so it has been draining some purulent drainage.  He denies history of abscesses or any associated fever, body aches, chills.  Patient does take Coumadin.   Abscess   Past Medical History:  Diagnosis Date   DDD (degenerative disc disease), lumbosacral    Hemolytic anemia ([place]) 06/29/2014   History of bacterial endocarditis    07/ 2001  enterococcal//  recurrence  11/ 2009  steptococcus viridans   History of chest tube placement    1980's --- R SIDE  SPONTANEOUS  PNEUMOTHORAX   History of Clostridium difficile colitis    2009   History of periodontal disease    Prostate cancer [facility]) urologist--  dr herrick/  oncologist-- dr Tammi Klippel   Stage  T1c,  Gleason 3+4,  PSA 3.76,  vol 33.45cc   Pulmonary nodules followed by dr Stanford Breed   right upper & lower lobe and left lower lobe--  stable per CT 08-29-2014   S/P redo mitral valve replacement with metallic valve cardiologist--  dr Stanford Breed   08-11-2014--- 31 mm Sorin Carbomedics Optiform bileaflet mechanical prosthesis   Thoracic radiculopathy    T7  (positional)  per PCP note--  pt doing yoga and swimming state has helpted   Wears glasses    Wears partial dentures    upper    Patient Active Problem List   Diagnosis Date Noted   Epidermal inclusion cyst 03/11/2018   Neutropenia ([place]) 08/26/2017   Healthcare maintenance 03/08/2016   Strain of lumbar paraspinal muscle 08/01/2015   Left knee pain 08/01/2015   Long term current use of anticoagulant therapy 08/19/2014   S/P redo mitral valve replacement with metallic valve 54/62/7035   Mitral valve regurgitation 08/09/2014   Severe mitral  regurgitation 08/03/2014   Hx of mitral valve repair 08/03/2014   Mitral regurgitation 07/19/2014   Hemolytic anemia ([place]) 06/29/2014   Cystitis 06/23/2014   Malignant neoplasm of prostate ([place]) 06/23/2014   Nodule of right lung 06/23/2014   Symptomatic anemia 06/21/2014   Prostate cancer ([place]) 06/07/2014   Microcytic anemia    Hyperlipidemia 05/31/2014   Nonspecific abnormal electrocardiogram (ECG) (EKG) 11/19/2011   HLD (hyperlipidemia) 01/12/2010   GERD 01/12/2010   HIATAL HERNIA 05/12/2009   Mitral valve disorder 06/29/2008    Past Surgical History:  Procedure Laterality Date   CARDIAC CATHETERIZATION  03-15-2008  dr Lia Foyer   no sig. coronary disease/  normal LVF/  moderate pulmonary hypertension w/ elevated V wave   COLONOSCOPY  last one 12/ 2013   ESOPHAGOGASTRODUODENOSCOPY N/A 06/02/2014   Procedure: ESOPHAGOGASTRODUODENOSCOPY (EGD);  Surgeon: Jerene Bears, MD;  Location: [facility] ENDOSCOPY;  Service: Endoscopy;  Laterality: N/A;   LEFT AND RIGHT HEART CATHETERIZATION WITH CORONARY ANGIOGRAM N/A 08/10/2014   Procedure: LEFT AND RIGHT HEART CATHETERIZATION WITH CORONARY ANGIOGRAM;  Surgeon: Belva Crome, MD;  Location: [facility] CATH LAB;  Service: Cardiovascular;  Laterality: N/A;   EF 55%,  severe MR, severe pulm HTN,  40% LAD   MITRAL VALVE REPAIR  03-22-2008   via  right mini thoracotomy   MITRAL VALVE REPAIR N/A 08/11/2014  Procedure: REDO MITRAL VALVE (MV) REPLACEMENT;  Surgeon: Rexene Alberts, MD;  Location: [place];  Service: Open Heart Surgery;  Laterality: N/A;  NO NECK LINES ON RIGHT SIDE   MULTIPLE TOOTH EXTRACTIONS  03-16-2008    w/ alveoloplasty   PROSTATE BIOPSY  03/2014   RADIOACTIVE SEED IMPLANT N/A 03/17/2015   Procedure: RADIOACTIVE SEED IMPLANT/BRACHYTHERAPY IMPLANT;  Surgeon: Ardis Hughs, MD;  Location: [facility];  Service: Urology;  Laterality: N/A;  DR PORTABLE   TEE WITHOUT CARDIOVERSION N/A 07/19/2014   Procedure: TRANSESOPHAGEAL  ECHOCARDIOGRAM (TEE);  Surgeon: Lelon Perla, MD;  Location: [facility] ENDOSCOPY;  Service: Cardiovascular;  Laterality: N/A;   TEE WITHOUT CARDIOVERSION N/A 08/11/2014   Procedure: TRANSESOPHAGEAL ECHOCARDIOGRAM (TEE);  Surgeon: Rexene Alberts, MD;  Location: [place];  Service: Open Heart Surgery;  Laterality: N/A;   TRANSTHORACIC ECHOCARDIOGRAM  09-23-2014   dr  Stanford Breed   mild focal basal LVH of the septum,  ef 45-50%,  normal wall motion/  mechanical bileaflet MV sits well in mitrial position, no sig. regurg., valve area 1.61 cm^2,  mean grandient 38mHg/  mild LAE/  RA prominent Chiari malformation , unchanged from prior study, normal size/  mild TR/  mild to moderate pulmonic insuffiency       Home Medications    Prior to Admission medications   Medication Sig Start Date End Date Taking? Authorizing Provider  cephALEXin (KEFLEX) 500 MG capsule Take 1 capsule (500 mg total) by mouth 4 (four) times daily. 03/07/22  Yes MTeodora Medici FNP  aspirin EC 81 MG EC tablet Take 1 tablet (81 mg total) by mouth daily. 08/17/14   Barrett, Erin R, PA-C  carvedilol (COREG) 3.125 MG tablet TAKE 1 TABLET BY MOUTH TWICE A DAY WITH A MEAL 02/15/22   CLelon Perla MD  chlorhexidine (PERIDEX) 0.12 % solution Use as directed 15 mLs in the mouth or throat 2 (two) times daily. 12/06/21   Lamptey,Myrene Galas MD  co-enzyme Q-10 30 MG capsule Take 30 mg by mouth daily.    [provider]  Multiple Minerals (JOINT HEALTH MINERAL PO) Take 1 tablet by mouth daily.    [provider]  Multiple Vitamin (MULTIVITAMIN) tablet Take 1 tablet by mouth daily.    [provider]  omega-3 acid ethyl esters (LOVAZA) 1 g capsule Take by mouth 2 (two) times daily.    [provider]  OVER THE COUNTER MEDICATION 1 tablet.    [provider]  ranitidine (ZANTAC 75) 75 MG tablet Take 1 tablet (75 mg total) by mouth 2 (two) times daily. 03/13/18   KLibby Maw MD  VITAMIN D PO Take  by mouth.    [provider]  warfarin (COUMADIN) 5 MG tablet Take 1 tablet by mouth once daily as prescribed by Coumadin Clinic. CALL THE OFFICE TO SCHEDULE AN APPT WITH CARDIOLOGIST. 02/22/22   CLelon Perla MD    Family History Family History  Problem Relation Age of Onset   Ovarian cancer Sister    Alzheimer's disease Father    Diabetes Neg Hx    Heart disease Neg Hx    Stroke Neg Hx     Social History Social History   Tobacco Use   Smoking status: Former    Packs/day: 2.00    Years: 15.00    Total pack years: 30.00    Types: Cigarettes    Quit date: 08/08/1989    Years since quitting: 3246  Smokeless tobacco: Never  Vaping Use   Vaping Use: Never used  Substance Use Topics   Alcohol use: No   Drug use: No    Types: Cocaine, Heroin, Marijuana    Comment: "stopped using drugs in ~ 1984"     Allergies   Clarithromycin   Review of Systems Review of Systems Per HPI  Physical Exam Triage Vital Signs ED Triage Vitals  Enc Vitals Group     BP 03/07/22 0835 (!) 149/93     Pulse Rate 03/07/22 0835 81     Resp 03/07/22 0835 18     Temp 03/07/22 0835 98 F (36.7 C)     Temp Source 03/07/22 0835 Oral     SpO2 03/07/22 0835 98 %     Weight --      Height --      Head Circumference --      Peak Flow --      Pain Score 03/07/22 0836 3     Pain Loc --      Pain Edu? --      Excl. in [place]? --    No data found.  Updated Vital Signs BP (!) 149/93 (BP Location: Left Arm)   Pulse 81   Temp 98 F (36.7 C) (Oral)   Resp 18   SpO2 98%   Visual Acuity Right Eye Distance:   Left Eye Distance:   Bilateral Distance:    Right Eye Near:   Left Eye Near:    Bilateral Near:     Physical Exam Constitutional:      General: He is not in acute distress.    Appearance: Normal appearance. He is not toxic-appearing or diaphoretic.  HENT:     Head: Normocephalic and atraumatic.  Eyes:     Extraocular Movements: Extraocular movements intact.      Conjunctiva/sclera: Conjunctivae normal.  Pulmonary:     Effort: Pulmonary effort is normal.  Skin:    Comments: Approximately 2.5 cm indurated abscess present to right mid upper back.  There is an area directly above abscess that is approximately 3 inches in diameter that is very spongy as well but not erythematous.  Neurological:     General: No focal deficit present.     Mental Status: He is alert and oriented to person, place, and time. Mental status is at baseline.  Psychiatric:        Mood and Affect: Mood normal.        Behavior: Behavior normal.        Thought Content: Thought content normal.        Judgment: Judgment normal.      UC Treatments / Results  Labs (all labs ordered are listed, but only abnormal results are displayed) Labs Reviewed - No data to display  EKG   Radiology No results found.  Procedures Incision and Drainage  Date/Time: 03/07/2022 9:29 AM  Performed by: Teodora Medici, FNP Authorized by: Teodora Medici, FNP   Consent:    Consent obtained:  Verbal   Consent given by:  Patient   Risks, benefits, and alternatives were discussed: yes     Risks discussed:  Bleeding, incomplete drainage and pain   Alternatives discussed:  Delayed treatment, no treatment and alternative treatment Universal protocol:    Procedure explained and questions answered to patient or proxy's satisfaction: yes     Immediately prior to procedure, a time out was called: yes     Patient identity confirmed:  Verbally  with patient and arm band Location:    Type:  Abscess   Location:  Trunk   Trunk location:  Back Pre-procedure details:    Skin preparation:  Chlorhexidine with alcohol and povidone-iodine Anesthesia:    Anesthesia method:  Local infiltration   Local anesthetic:  Lidocaine 1% w/o epi Procedure type:    Complexity:  Simple Procedure details:    Incision types:  Stab incision   Wound management:  Probed and deloculated   Drainage:  Purulent   Drainage  amount:  Scant   Packing materials:  None Post-procedure details:    Procedure completion:  Tolerated well, no immediate complications (nonadherent dressing placed)  (including critical care time)  Medications Ordered in UC Medications - No data to display  Initial Impression / Assessment and Plan / UC Course  I have reviewed the triage vital signs and the nursing notes.  Pertinent labs & imaging results that were available during my care of the patient were reviewed by me and considered in my medical decision making (see chart for details).     Patient has abscess to back.  Abscess was indurated but very swollen so I do think that I&D should be attempted. Discussed risk and benefits with patient prior to I&D given that he takes Coumadin.  Patient wished to proceed with I&D procedure and accepted risk.  I&D completed with minimal drainage obtained.  Although, abscess did decrease in size.  Pressure Dressing was applied after drainage given the patient takes Coumadin and bleeding stopped prior to discharge.  The area above the abscess is concerning for possible lipoma as opposed to the abscess.  Although, it could also be part of the abscess.  Patient advised to monitor this very closely for any worsening signs.  Will treat with cephalexin and patient was advised to continue warm compresses.  Given the patient takes Coumadin, he was advised that he will need to follow-up with provider who prescribes Coumadin to have more frequent INRs to ensure they stay in therapeutic range.  Patient verbalized understanding of this.  He was also advised that if there is no improvement in the next 48 to 72 hours then he needs to follow-up either with emergency department or general surgery at provided contact information.  He was also advised that if bleeding restarts and he is not able to get it stopped that he is to go to the ER.  Patient was given strict ER precautions.  Patient verbalized understanding and was  agreeable with plan. Final Clinical Impressions(s) / UC Diagnoses   Final diagnoses:  Back abscess     Discharge Instructions      I have prescribed you an antibiotic for your abscess.  Please continue warm compresses.  Change dressing daily and as needed if it becomes soiled.  Please follow-up with primary care doctor or whoever prescribes your Coumadin to have INR checked more frequently given that antibiotic can alter it.  Please follow-up with general surgery or emergency department if symptoms do not improve or if they worsen.     ED Prescriptions     Medication Sig Dispense Auth. Provider   cephALEXin (KEFLEX) 500 MG capsule Take 1 capsule (500 mg total) by mouth 4 (four) times daily. 28 capsule Trent, Michele Rockers, [place]      PDMP not reviewed this encounter.   Teodora Medici, [place] 03/07/22 1010

## 2022-03-07 NOTE — Discharge Instructions (Signed)
I have prescribed you an antibiotic for your abscess.  Please continue warm compresses.  Change dressing daily and as needed if it becomes soiled.  Please follow-up with primary care doctor or whoever prescribes your Coumadin to have INR checked more frequently given that antibiotic can alter it.  Please follow-up with general surgery or emergency department if symptoms do not improve or if they worsen.

## 2022-04-02 ENCOUNTER — Ambulatory Visit: Payer: Managed Care, Other (non HMO) | Attending: Cardiology | Admitting: *Deleted

## 2022-04-02 DIAGNOSIS — Z954 Presence of other heart-valve replacement: Secondary | ICD-10-CM

## 2022-04-02 DIAGNOSIS — Z7901 Long term (current) use of anticoagulants: Secondary | ICD-10-CM | POA: Diagnosis not present

## 2022-04-02 DIAGNOSIS — I059 Rheumatic mitral valve disease, unspecified: Secondary | ICD-10-CM

## 2022-04-02 LAB — POCT INR: INR: 2 (ref 2.0–3.0)

## 2022-04-02 NOTE — Patient Instructions (Signed)
Description   Today take 1.5 tablets then continue taking 1 tablet daily. Repeat INR in 4 weeks [facility] [phone]

## 2022-04-30 ENCOUNTER — Ambulatory Visit: Payer: Managed Care, Other (non HMO) | Attending: Internal Medicine

## 2022-04-30 DIAGNOSIS — Z954 Presence of other heart-valve replacement: Secondary | ICD-10-CM

## 2022-04-30 DIAGNOSIS — Z7901 Long term (current) use of anticoagulants: Secondary | ICD-10-CM

## 2022-04-30 DIAGNOSIS — Z5181 Encounter for therapeutic drug level monitoring: Secondary | ICD-10-CM

## 2022-04-30 DIAGNOSIS — I059 Rheumatic mitral valve disease, unspecified: Secondary | ICD-10-CM | POA: Diagnosis not present

## 2022-04-30 LAB — POCT INR: INR: 2.2 (ref 2.0–3.0)

## 2022-04-30 NOTE — Patient Instructions (Signed)
Today take 2 tablets then INCREASE TO  1 tablet daily, EXCEPT 1.5 TABLETS ON WEDNESDAY Repeat INR in 3 weeks [facility] [phone]

## 2022-05-13 ENCOUNTER — Encounter: Payer: Self-pay | Admitting: Gastroenterology

## 2022-05-18 ENCOUNTER — Other Ambulatory Visit: Payer: Self-pay | Admitting: Cardiology

## 2022-05-18 DIAGNOSIS — Z954 Presence of other heart-valve replacement: Secondary | ICD-10-CM

## 2022-05-20 ENCOUNTER — Ambulatory Visit: Payer: Managed Care, Other (non HMO) | Attending: Cardiology | Admitting: *Deleted

## 2022-05-20 DIAGNOSIS — I059 Rheumatic mitral valve disease, unspecified: Secondary | ICD-10-CM | POA: Diagnosis not present

## 2022-05-20 DIAGNOSIS — Z954 Presence of other heart-valve replacement: Secondary | ICD-10-CM

## 2022-05-20 DIAGNOSIS — Z7901 Long term (current) use of anticoagulants: Secondary | ICD-10-CM

## 2022-05-20 LAB — POCT INR: INR: 2.9 (ref 2.0–3.0)

## 2022-05-20 NOTE — Patient Instructions (Addendum)
Description   Continue taking warfarin tablet daily, EXCEPT 1.5 TABLETS ON WEDNESDAY. Repeat INR in 4 weeks. [facility]in Clinic [phone]

## 2022-05-20 NOTE — Telephone Encounter (Addendum)
Refill request for warfarin:  Last INR was 2.2 on 04/30/22 Next INR due 05/20/22 LOV was 09/29/19.  Has appt with B Crenshaw MD  08/19/22  Refill approved.

## 2022-06-26 ENCOUNTER — Ambulatory Visit: Payer: Managed Care, Other (non HMO) | Attending: Cardiology

## 2022-06-26 DIAGNOSIS — I059 Rheumatic mitral valve disease, unspecified: Secondary | ICD-10-CM

## 2022-06-26 DIAGNOSIS — Z954 Presence of other heart-valve replacement: Secondary | ICD-10-CM

## 2022-06-26 DIAGNOSIS — Z7901 Long term (current) use of anticoagulants: Secondary | ICD-10-CM | POA: Diagnosis not present

## 2022-06-26 LAB — POCT INR: INR: 2.2 (ref 2.0–3.0)

## 2022-06-26 NOTE — Patient Instructions (Signed)
Description   Take 2 tablets today, the resume same dosage of warfarin 1 tablet daily, EXCEPT 1.5 TABLETS ON WEDNESDAY. Repeat INR in 3-4 weeks. [facility] [phone]

## 2022-07-24 ENCOUNTER — Ambulatory Visit
Payer: Managed Care, Other (non HMO) | Attending: Cardiology | Admitting: Pharmacist Clinician (PhC)/ Clinical Pharmacy Specialist

## 2022-07-24 DIAGNOSIS — Z954 Presence of other heart-valve replacement: Secondary | ICD-10-CM

## 2022-07-24 DIAGNOSIS — Z7901 Long term (current) use of anticoagulants: Secondary | ICD-10-CM

## 2022-07-24 DIAGNOSIS — I059 Rheumatic mitral valve disease, unspecified: Secondary | ICD-10-CM | POA: Diagnosis not present

## 2022-07-24 LAB — POCT INR: INR: 4.4 — AB (ref 2.0–3.0)

## 2022-07-24 NOTE — Patient Instructions (Signed)
Take just 1/2 tablet today (Wednesday March 27) then resume same dosage of warfarin 1 tablet daily, EXCEPT 1.5 TABLETS ON WEDNESDAY. Repeat INR in 3 weeks. [facility] [phone]

## 2022-08-06 NOTE — Progress Notes (Signed)
HPI: FU MVR. He has history of endocarditis. In November of 2009 he had mitral valve repair  via a mini thoracotomy. Transesophageal echocardiogram 3/16 revealed normal LV function, s/p MV repair with severe mitral regurgitation directed posteriorly and laterally possibly from perforated posterior mitral valve leaflet. There was a smaller second jet as well. Cath 4/16 showed EF 55, severe MR, severe pulm HTN, 40 LAD. Pt had MVR (bileaflet mechanical prosthesis) 4/16. Echocardiogram May 2016 showed mildly reduced LV function, mechanical mitral valve with no significant mitral regurgitation and mean gradient 6 mmHg. There was mild left atrial enlargement. There was mild to moderate pulmonic insufficiency. Chest CT November 2021 showed 6 mm nodule left upper lobe now felt to be benign in etiology.  Also note of 9 mm subpleural nodule right upper lobe consistent with benign etiology.  Since last seen, patient denies dyspnea, chest pain, palpitations, syncope or bleeding.  Current Outpatient Medications  Medication Sig Dispense Refill   aspirin EC 81 MG EC tablet Take 1 tablet (81 mg total) by mouth daily.     carvedilol (COREG) 3.125 MG tablet TAKE 1 TABLET BY MOUTH TWICE A DAY WITH A MEAL 180 tablet 1   co-enzyme Q-10 30 MG capsule Take 30 mg by mouth daily.     Multiple Minerals (JOINT HEALTH MINERAL PO) Take 1 tablet by mouth daily.     Multiple Vitamin (MULTIVITAMIN) tablet Take 1 tablet by mouth daily.     omega-3 acid ethyl esters (LOVAZA) 1 g capsule Take by mouth 2 (two) times daily.     OVER THE COUNTER MEDICATION 1 tablet.     VITAMIN D PO Take by mouth.     warfarin (COUMADIN) 5 MG tablet TAKE 1 TABLET DAILY EXCEPT 1.5 TABLETS ON WEDNESDAY OR AS DIRECTED BY ANTICOAGULATION CLINIC. 90 tablet 0   chlorhexidine (PERIDEX) 0.12 % solution Use as directed 15 mLs in the mouth or throat 2 (two) times daily. 120 mL 0   ranitidine (ZANTAC 75) 75 MG tablet Take 1 tablet (75 mg total) by mouth 2  (two) times daily. (Patient not taking: Reported on 08/19/2022) 90 tablet 3   No current facility-administered medications for this visit.     Past Medical History:  Diagnosis Date   DDD (degenerative disc disease), lumbosacral    Hemolytic anemia 06/29/2014   History of bacterial endocarditis    07/ 2001  enterococcal//  recurrence  11/ 2009  steptococcus viridans   History of chest tube placement    1980's --- R SIDE  SPONTANEOUS  PNEUMOTHORAX   History of Clostridium difficile colitis    2009   History of periodontal disease    Prostate cancer urologist--  dr herrick/  oncologist-- dr Kathrynn Running   Stage  T1c,  Gleason 3+4,  PSA 3.76,  vol 33.45cc   Pulmonary nodules followed by dr [name]   right upper & lower lobe and left lower lobe--  stable per CT 08-29-2014   S/P redo mitral valve replacement with metallic valve cardiologist--  dr [name]   08-11-2014--- 31 mm Sorin Carbomedics Optiform bileaflet mechanical prosthesis   Thoracic radiculopathy    T7  (positional)  per PCP note--  pt doing yoga and swimming state has helpted   Wears glasses    Wears partial dentures    upper    Past Surgical History:  Procedure Laterality Date   CARDIAC CATHETERIZATION  03-15-2008  dr Riley Kill   no sig. coronary disease/  normal LVF/  moderate  pulmonary hypertension w/ elevated V wave   COLONOSCOPY  last one 12/ 2013   ESOPHAGOGASTRODUODENOSCOPY N/A 06/02/2014   Procedure: ESOPHAGOGASTRODUODENOSCOPY (EGD);  Surgeon: Beverley Fiedler, MD;  Location: [facility] ENDOSCOPY;  Service: Endoscopy;  Laterality: N/A;   LEFT AND RIGHT HEART CATHETERIZATION WITH CORONARY ANGIOGRAM N/A 08/10/2014   Procedure: LEFT AND RIGHT HEART CATHETERIZATION WITH CORONARY ANGIOGRAM;  Surgeon: Lyn Records, MD;  Location: [facility] CATH LAB;  Service: Cardiovascular;  Laterality: N/A;   EF 55%,  severe MR, severe pulm HTN,  40% LAD   MITRAL VALVE REPAIR  03-22-2008   via  right mini thoracotomy   MITRAL VALVE REPAIR N/A 08/11/2014    Procedure: REDO MITRAL VALVE (MV) REPLACEMENT;  Surgeon: Purcell Nails, MD;  Location: [facility] OR;  Service: Open Heart Surgery;  Laterality: N/A;  NO NECK LINES ON RIGHT SIDE   MULTIPLE TOOTH EXTRACTIONS  03-16-2008    w/ alveoloplasty   PROSTATE BIOPSY  03/2014   RADIOACTIVE SEED IMPLANT N/A 03/17/2015   Procedure: RADIOACTIVE SEED IMPLANT/BRACHYTHERAPY IMPLANT;  Surgeon: Crist Fat, MD;  Location: [facility];  Service: Urology;  Laterality: N/A;  DR PORTABLE   TEE WITHOUT CARDIOVERSION N/A 07/19/2014   Procedure: TRANSESOPHAGEAL ECHOCARDIOGRAM (TEE);  Surgeon: Lewayne Bunting, MD;  Location: [facility] ENDOSCOPY;  Service: Cardiovascular;  Laterality: N/A;   TEE WITHOUT CARDIOVERSION N/A 08/11/2014   Procedure: TRANSESOPHAGEAL ECHOCARDIOGRAM (TEE);  Surgeon: Purcell Nails, MD;  [name];  Service: Open Heart Surgery;  Laterality: N/A;   TRANSTHORACIC ECHOCARDIOGRAM  09-23-2014   dr  [name]   mild focal basal LVH of the septum,  ef 45-50%,  normal wall motion/  mechanical bileaflet MV sits well in mitrial position, no sig. 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  Tobacco Use   Smoking status: Former    Packs/day: 2.00    Years: 15.00    Additional pack years: 0.00    Total pack years: 30.00    Types: Cigarettes    Quit date: 08/08/1989    Years since quitting: 33.0   Smokeless tobacco: Never  Vaping Use   Vaping Use: Never used  Substance and Sexual Activity   Alcohol use: No   Drug use: No     Types: Cocaine, Heroin, Marijuana    Comment: "stopped using drugs in ~ 1984"   Sexual activity: Not on file  Other Topics Concern   Not on file  Social History Narrative   REGULAR EXERCISE 2X WK TREADMILL,WEIGHTS   Social Determinants of Health   Financial Resource Strain: Not on file  Food Insecurity: Not on file  Transportation Needs: Not on file  Physical Activity: Not on file  Stress: Not on file  Social Connections: Not on file  Intimate Partner Violence: Not on file    Family History  Problem Relation Age of Onset   Ovarian cancer Sister    Alzheimer's disease Father    Diabetes Neg Hx  Heart disease Neg Hx    Stroke Neg Hx     ROS: no fevers or chills, productive cough, hemoptysis, dysphasia, odynophagia, melena, hematochezia, dysuria, hematuria, rash, seizure activity, orthopnea, PND, pedal edema, claudication. Remaining systems are negative.  Physical Exam: Well-developed well-nourished in no acute distress.  Skin is warm and dry.  HEENT is normal.  Neck is supple.  Chest is clear to auscultation with normal expansion.  Cardiovascular exam is regular rate and rhythm.  Crisp mechanical valve sound.  No murmur noted. Abdominal exam nontender or distended. No masses palpated. Extremities show no edema. neuro grossly intact   A/P  1 status post mitral valve replacement-continue Coumadin and aspirin.  Continue SBE prophylaxis.  Will plan repeat echocardiogram.  Check hemoglobin.  2 history of lung nodule-most recent CT showed no change and felt to be benign etiology.  3 hyperlipidemia-check lipids and renal function.  4 history of mildly reduced LV function-as above we will plan to repeat echocardiogram.  5 elevated blood pressure reading-his blood pressure is elevated today but he follows this at home and it is typically controlled with systolic less than 130.  Will follow and add medications if needed.  Olga Millers, MD

## 2022-08-08 ENCOUNTER — Other Ambulatory Visit: Payer: Self-pay | Admitting: Cardiology

## 2022-08-08 DIAGNOSIS — Z954 Presence of other heart-valve replacement: Secondary | ICD-10-CM

## 2022-08-09 NOTE — Telephone Encounter (Signed)
Warfarin 5mg  S/P redo mitral valve replacement with metallic valve  Last INR 07/24/22 Last OV 09/29/19 & HAS APPT PENDING 08/19/22

## 2022-08-19 ENCOUNTER — Ambulatory Visit: Payer: Managed Care, Other (non HMO) | Attending: Cardiology | Admitting: Cardiology

## 2022-08-19 ENCOUNTER — Encounter: Payer: Self-pay | Admitting: Cardiology

## 2022-08-19 ENCOUNTER — Ambulatory Visit (INDEPENDENT_AMBULATORY_CARE_PROVIDER_SITE_OTHER): Payer: Managed Care, Other (non HMO) | Admitting: Pharmacist

## 2022-08-19 VITALS — BP 148/74 | HR 88 | Ht 70.5 in | Wt 153.0 lb

## 2022-08-19 DIAGNOSIS — Z7901 Long term (current) use of anticoagulants: Secondary | ICD-10-CM | POA: Diagnosis not present

## 2022-08-19 DIAGNOSIS — E785 Hyperlipidemia, unspecified: Secondary | ICD-10-CM

## 2022-08-19 DIAGNOSIS — E7800 Pure hypercholesterolemia, unspecified: Secondary | ICD-10-CM

## 2022-08-19 DIAGNOSIS — I059 Rheumatic mitral valve disease, unspecified: Secondary | ICD-10-CM

## 2022-08-19 DIAGNOSIS — Z954 Presence of other heart-valve replacement: Secondary | ICD-10-CM | POA: Diagnosis not present

## 2022-08-19 LAB — POCT INR: INR: 3.2 — AB (ref 2.0–3.0)

## 2022-08-19 NOTE — Patient Instructions (Signed)
  Testing/Procedures:  Your physician has requested that you have an echocardiogram. Echocardiography is a painless test that uses sound waves to create images of your heart. It provides your doctor with information about the size and shape of your heart and how well your heart's chambers and valves are working. This procedure takes approximately one hour. There are no restrictions for this procedure. Please do NOT wear cologne, perfume, aftershave, or lotions (deodorant is allowed). Please arrive 15 minutes prior to your appointment time. [address]   Follow-Up: At [facility], you and your health needs are our priority.  As part of our continuing mission to provide you with exceptional heart care, we have created designated Provider Care Teams.  These Care Teams include your primary Cardiologist (physician) and Advanced Practice Providers (APPs -  Physician Assistants and Nurse Practitioners) who all work together to provide you with the care you need, when you need it.  We recommend signing up for the patient portal called "MyChart".  Sign up information is provided on this After Visit Summary.  MyChart is used to connect with patients for Virtual Visits (Telemedicine).  Patients are able to view lab/test results, encounter notes, upcoming appointments, etc.  Non-urgent messages can be sent to your provider as well.   To learn more about what you can do with MyChart, go to [email or web address].    Your next appointment:   12 month(s)  Provider:   Brian Crenshaw, MD      

## 2022-08-19 NOTE — Patient Instructions (Signed)
Description   Continue taking warfarin 1 tablet daily, EXCEPT 1.5 TABLETS ON WEDNESDAY. Repeat INR in 4 weeks. [facility]in [facility] [phone]

## 2022-08-20 ENCOUNTER — Telehealth: Payer: Self-pay | Admitting: *Deleted

## 2022-08-20 DIAGNOSIS — E785 Hyperlipidemia, unspecified: Secondary | ICD-10-CM

## 2022-08-20 DIAGNOSIS — E7800 Pure hypercholesterolemia, unspecified: Secondary | ICD-10-CM

## 2022-08-20 LAB — LIPID PANEL
Chol/HDL Ratio: 2.9 ratio (ref 0.0–5.0)
Cholesterol, Total: 251 mg/dL — ABNORMAL HIGH (ref 100–199)
HDL: 86 mg/dL (ref >39)
LDL Chol Calc (NIH): 145 mg/dL — ABNORMAL HIGH (ref 0–99)
Triglycerides: 114 mg/dL (ref 0–149)
VLDL Cholesterol Cal: 20 mg/dL (ref 5–40)

## 2022-08-20 LAB — BASIC METABOLIC PANEL
BUN/Creatinine Ratio: 14 (ref 10–24)
BUN: 12 mg/dL (ref 8–27)
CO2: 24 mmol/L (ref 20–29)
Calcium: 9.9 mg/dL (ref 8.6–10.2)
Chloride: 101 mmol/L (ref 96–106)
Creatinine, Ser: 0.85 mg/dL (ref 0.76–1.27)
Glucose: 88 mg/dL (ref 70–99)
Potassium: 4.8 mmol/L (ref 3.5–5.2)
Sodium: 139 mmol/L (ref 134–144)
eGFR: 98 mL/min/{1.73_m2} (ref >59)

## 2022-08-20 LAB — CBC
Hematocrit: 40.7 % (ref 37.5–51.0)
Hemoglobin: 13.4 g/dL (ref 13.0–17.7)
MCH: 29.8 pg (ref 26.6–33.0)
MCHC: 32.9 g/dL (ref 31.5–35.7)
MCV: 90 fL (ref 79–97)
Platelets: 223 10*3/uL (ref 150–450)
RBC: 4.5 x10E6/uL (ref 4.14–5.80)
RDW: 13.2 % (ref 11.6–15.4)
WBC: 2.1 10*3/uL — CL (ref 3.4–10.8)

## 2022-08-20 NOTE — Telephone Encounter (Signed)
Left message for pt to call.

## 2022-08-20 NOTE — Telephone Encounter (Signed)
-----   Message from Lewayne Bunting, MD sent at 08/20/2022  7:16 AM EDT ----- FU primary care for low WBC; add crestor 20 mg daily; lipids and liver 8 weeks Olga Millers

## 2022-08-22 MED ORDER — ROSUVASTATIN CALCIUM 20 MG PO TABS: 20.0000 mg | ORAL_TABLET | Freq: Every day | ORAL | 3 refills | Status: DC

## 2022-08-22 NOTE — Telephone Encounter (Signed)
Spoke with pt, Aware of dr crenshaw's recommendations. New script sent to the pharmacy and Lab orders mailed to the pt  

## 2022-08-26 ENCOUNTER — Other Ambulatory Visit: Payer: Self-pay | Admitting: Cardiology

## 2022-09-11 ENCOUNTER — Telehealth: Payer: Self-pay

## 2022-09-11 ENCOUNTER — Ambulatory Visit (HOSPITAL_COMMUNITY): Payer: Managed Care, Other (non HMO) | Attending: Cardiology

## 2022-09-11 DIAGNOSIS — Z954 Presence of other heart-valve replacement: Secondary | ICD-10-CM | POA: Insufficient documentation

## 2022-09-11 NOTE — Telephone Encounter (Signed)
Lpmtcb and reschedule INR check 

## 2022-09-12 ENCOUNTER — Ambulatory Visit (HOSPITAL_BASED_OUTPATIENT_CLINIC_OR_DEPARTMENT_OTHER): Payer: Managed Care, Other (non HMO)

## 2022-09-12 DIAGNOSIS — Z954 Presence of other heart-valve replacement: Secondary | ICD-10-CM | POA: Diagnosis present

## 2022-09-12 LAB — ECHOCARDIOGRAM COMPLETE
Area-P 1/2: 3.19 cm2
MV VTI: 1.46 cm2
S' Lateral: 3.4 cm

## 2022-10-15 ENCOUNTER — Ambulatory Visit: Payer: Managed Care, Other (non HMO) | Attending: Cardiology

## 2022-10-15 DIAGNOSIS — I059 Rheumatic mitral valve disease, unspecified: Secondary | ICD-10-CM

## 2022-10-15 DIAGNOSIS — Z7901 Long term (current) use of anticoagulants: Secondary | ICD-10-CM | POA: Diagnosis not present

## 2022-10-15 DIAGNOSIS — Z954 Presence of other heart-valve replacement: Secondary | ICD-10-CM

## 2022-10-15 LAB — POCT INR: INR: 3.3 — AB (ref 2.0–3.0)

## 2022-10-15 NOTE — Patient Instructions (Signed)
Description   Continue taking warfarin 1 tablet daily, EXCEPT 1.5 TABLETS ON WEDNESDAYS. Repeat INR in 6 weeks. [facility] [phone]

## 2022-10-29 ENCOUNTER — Encounter: Payer: Self-pay | Admitting: *Deleted

## 2022-10-29 ENCOUNTER — Other Ambulatory Visit: Payer: Self-pay | Admitting: Cardiology

## 2022-10-29 DIAGNOSIS — Z954 Presence of other heart-valve replacement: Secondary | ICD-10-CM

## 2022-10-30 NOTE — Telephone Encounter (Signed)
Warfarin 5mg   S/P redo mitral valve replacement with metallic valve  Last INR 10/15/22 Last OV 08/19/22

## 2022-11-07 LAB — LIPID PANEL
Chol/HDL Ratio: 2.5 ratio (ref 0.0–5.0)
Cholesterol, Total: 181 mg/dL (ref 100–199)
HDL: 73 mg/dL (ref >39)
LDL Chol Calc (NIH): 98 mg/dL (ref 0–99)
Triglycerides: 50 mg/dL (ref 0–149)
VLDL Cholesterol Cal: 10 mg/dL (ref 5–40)

## 2022-11-07 LAB — HEPATIC FUNCTION PANEL
ALT: 34 IU/L (ref 0–44)
AST: 39 IU/L (ref 0–40)
Albumin: 4.6 g/dL (ref 3.9–4.9)
Alkaline Phosphatase: 81 IU/L (ref 44–121)
Bilirubin Total: 0.4 mg/dL (ref 0.0–1.2)
Bilirubin, Direct: 0.13 mg/dL (ref 0.00–0.40)
Total Protein: 7 g/dL (ref 6.0–8.5)

## 2022-11-28 ENCOUNTER — Ambulatory Visit: Payer: Managed Care, Other (non HMO) | Attending: Cardiology

## 2022-11-28 DIAGNOSIS — I059 Rheumatic mitral valve disease, unspecified: Secondary | ICD-10-CM | POA: Diagnosis not present

## 2022-11-28 DIAGNOSIS — Z954 Presence of other heart-valve replacement: Secondary | ICD-10-CM | POA: Diagnosis not present

## 2022-11-28 DIAGNOSIS — Z7901 Long term (current) use of anticoagulants: Secondary | ICD-10-CM | POA: Diagnosis not present

## 2022-11-28 LAB — POCT INR: INR: 4.4 — AB (ref 2.0–3.0)

## 2022-11-28 NOTE — Patient Instructions (Signed)
Description   HOLD today's dose and then continue taking warfarin 1 tablet daily, EXCEPT 1.5 TABLETS ON WEDNESDAYS.  Repeat INR in 4 weeks.  [facility] [phone]

## 2022-12-04 ENCOUNTER — Other Ambulatory Visit: Payer: Self-pay | Admitting: Cardiology

## 2022-12-31 ENCOUNTER — Ambulatory Visit: Payer: Managed Care, Other (non HMO) | Attending: Cardiology

## 2022-12-31 ENCOUNTER — Telehealth: Payer: Self-pay | Admitting: *Deleted

## 2022-12-31 NOTE — Telephone Encounter (Signed)
Called pt since he missed his Anticoagulation Appt today. There was no answer so left him a message to call back and reschedule. Will await and follow up.

## 2023-01-13 ENCOUNTER — Ambulatory Visit: Payer: Managed Care, Other (non HMO) | Attending: Cardiology | Admitting: *Deleted

## 2023-01-13 DIAGNOSIS — Z7901 Long term (current) use of anticoagulants: Secondary | ICD-10-CM

## 2023-01-13 DIAGNOSIS — Z954 Presence of other heart-valve replacement: Secondary | ICD-10-CM | POA: Diagnosis not present

## 2023-01-13 DIAGNOSIS — I059 Rheumatic mitral valve disease, unspecified: Secondary | ICD-10-CM

## 2023-01-13 LAB — POCT INR: INR: 5 — AB (ref 2.0–3.0)

## 2023-01-13 NOTE — Patient Instructions (Signed)
Description   Do not take any warfarin today and No warfarin tomorrow then START taking warfarin 1 tablet daily. Repeat INR in 3 weeks.  [facility] [phone]

## 2023-01-26 ENCOUNTER — Other Ambulatory Visit: Payer: Self-pay | Admitting: Cardiology

## 2023-01-26 DIAGNOSIS — Z954 Presence of other heart-valve replacement: Secondary | ICD-10-CM

## 2023-02-04 ENCOUNTER — Ambulatory Visit: Payer: Managed Care, Other (non HMO) | Attending: Cardiovascular Disease | Admitting: *Deleted

## 2023-02-04 DIAGNOSIS — Z954 Presence of other heart-valve replacement: Secondary | ICD-10-CM | POA: Diagnosis not present

## 2023-02-04 DIAGNOSIS — Z7901 Long term (current) use of anticoagulants: Secondary | ICD-10-CM | POA: Diagnosis not present

## 2023-02-04 DIAGNOSIS — I059 Rheumatic mitral valve disease, unspecified: Secondary | ICD-10-CM | POA: Diagnosis not present

## 2023-02-04 LAB — POCT INR: INR: 3.2 — AB (ref 2.0–3.0)

## 2023-02-04 NOTE — Patient Instructions (Signed)
Description   Continue taking warfarin 1 tablet daily.  Repeat INR in 4 weeks.  [facility] [phone]

## 2023-03-05 ENCOUNTER — Ambulatory Visit: Payer: Managed Care, Other (non HMO) | Attending: Cardiovascular Disease

## 2023-03-05 DIAGNOSIS — Z7901 Long term (current) use of anticoagulants: Secondary | ICD-10-CM

## 2023-03-05 DIAGNOSIS — I059 Rheumatic mitral valve disease, unspecified: Secondary | ICD-10-CM | POA: Diagnosis not present

## 2023-03-05 DIAGNOSIS — Z954 Presence of other heart-valve replacement: Secondary | ICD-10-CM

## 2023-03-05 LAB — POCT INR: INR: 3.3 — AB (ref 2.0–3.0)

## 2023-03-05 NOTE — Patient Instructions (Signed)
Description   Continue taking warfarin 1 tablet daily.  Repeat INR in 5 weeks.  [facility] [phone]

## 2023-04-09 ENCOUNTER — Ambulatory Visit: Payer: Managed Care, Other (non HMO) | Attending: Cardiovascular Disease | Admitting: *Deleted

## 2023-04-09 DIAGNOSIS — Z7901 Long term (current) use of anticoagulants: Secondary | ICD-10-CM | POA: Diagnosis not present

## 2023-04-09 DIAGNOSIS — I059 Rheumatic mitral valve disease, unspecified: Secondary | ICD-10-CM | POA: Diagnosis not present

## 2023-04-09 DIAGNOSIS — Z954 Presence of other heart-valve replacement: Secondary | ICD-10-CM | POA: Diagnosis not present

## 2023-04-09 LAB — POCT INR: INR: 5.2 — AB (ref 2.0–3.0)

## 2023-04-09 NOTE — Patient Instructions (Signed)
Description   Do not take any warfarin today and no warfarin tomorrow then continue taking warfarin 1 tablet daily. Repeat INR in 3 weeks.  [facility] [phone]

## 2023-04-29 ENCOUNTER — Ambulatory Visit: Payer: Managed Care, Other (non HMO) | Attending: Cardiovascular Disease

## 2023-04-29 DIAGNOSIS — Z954 Presence of other heart-valve replacement: Secondary | ICD-10-CM | POA: Diagnosis not present

## 2023-04-29 DIAGNOSIS — Z7901 Long term (current) use of anticoagulants: Secondary | ICD-10-CM

## 2023-04-29 DIAGNOSIS — I059 Rheumatic mitral valve disease, unspecified: Secondary | ICD-10-CM

## 2023-04-29 LAB — POCT INR: INR: 4 — AB (ref 2.0–3.0)

## 2023-04-29 NOTE — Patient Instructions (Signed)
Description   Do not take any warfarin today, then start taking warfarin 1 tablet daily except 1/2 tablet on Mondays. Repeat INR in 3 weeks.  [facility] [phone]

## 2023-05-03 ENCOUNTER — Other Ambulatory Visit: Payer: Self-pay | Admitting: Cardiology

## 2023-05-03 DIAGNOSIS — Z954 Presence of other heart-valve replacement: Secondary | ICD-10-CM

## 2023-05-20 ENCOUNTER — Ambulatory Visit: Payer: Managed Care, Other (non HMO) | Attending: Cardiology | Admitting: *Deleted

## 2023-05-20 DIAGNOSIS — I059 Rheumatic mitral valve disease, unspecified: Secondary | ICD-10-CM | POA: Diagnosis not present

## 2023-05-20 DIAGNOSIS — Z7901 Long term (current) use of anticoagulants: Secondary | ICD-10-CM | POA: Diagnosis not present

## 2023-05-20 DIAGNOSIS — Z954 Presence of other heart-valve replacement: Secondary | ICD-10-CM

## 2023-05-20 LAB — POCT INR: INR: 1.6 — AB (ref 2.0–3.0)

## 2023-05-20 NOTE — Patient Instructions (Addendum)
Description   Today and tomorrow take 1.5 tablets of warfarin taking warfarin 1 tablet daily except 1/2 tablet on Mondays. Repeat INR in 2 weeks.  [facility] [phone]

## 2023-06-06 ENCOUNTER — Ambulatory Visit: Payer: Managed Care, Other (non HMO) | Attending: Cardiology

## 2023-06-06 DIAGNOSIS — Z954 Presence of other heart-valve replacement: Secondary | ICD-10-CM | POA: Diagnosis not present

## 2023-06-06 DIAGNOSIS — I059 Rheumatic mitral valve disease, unspecified: Secondary | ICD-10-CM

## 2023-06-06 DIAGNOSIS — Z7901 Long term (current) use of anticoagulants: Secondary | ICD-10-CM

## 2023-06-06 LAB — POCT INR: INR: 3.9 — AB (ref 2.0–3.0)

## 2023-06-06 NOTE — Patient Instructions (Signed)
 Take 0.5 tablet today only then continue  taking warfarin 1 tablet daily except 1/2 tablet on Mondays. Repeat INR in 4 weeks. Patient took 1 tablet on Mondays, accidentally. [facility] [phone]

## 2023-06-30 ENCOUNTER — Ambulatory Visit: Payer: Managed Care, Other (non HMO) | Attending: Cardiology

## 2023-06-30 ENCOUNTER — Telehealth: Payer: Self-pay | Admitting: *Deleted

## 2023-06-30 NOTE — Telephone Encounter (Signed)
 Called pt since he missed his appt today, there was no answer so left him a message to call back to reschedule appointment.  Pt just needs an Anticoagulation Appt for first available per availability.

## 2023-07-16 ENCOUNTER — Ambulatory Visit: Attending: Cardiology | Admitting: *Deleted

## 2023-07-16 DIAGNOSIS — I059 Rheumatic mitral valve disease, unspecified: Secondary | ICD-10-CM

## 2023-07-16 DIAGNOSIS — Z7901 Long term (current) use of anticoagulants: Secondary | ICD-10-CM | POA: Diagnosis not present

## 2023-07-16 DIAGNOSIS — Z954 Presence of other heart-valve replacement: Secondary | ICD-10-CM | POA: Diagnosis not present

## 2023-07-16 LAB — POCT INR: INR: 2.7 (ref 2.0–3.0)

## 2023-07-16 NOTE — Patient Instructions (Signed)
 Description   Continue taking warfarin 1 tablet daily except 1/2 tablet on Mondays. Repeat INR in 5 weeks.  [facility] [phone]

## 2023-08-07 ENCOUNTER — Other Ambulatory Visit: Payer: Self-pay | Admitting: Cardiology

## 2023-08-07 DIAGNOSIS — Z954 Presence of other heart-valve replacement: Secondary | ICD-10-CM

## 2023-08-07 NOTE — Telephone Encounter (Signed)
 Prescription refill request received for warfarin Lov: 08/19/22 [name])  Next INR check: 08/19/23 Warfarin tablet strength: 5mg   Appropriate dose. Refill sent.

## 2023-08-12 ENCOUNTER — Other Ambulatory Visit: Payer: Self-pay | Admitting: Cardiology

## 2023-08-12 DIAGNOSIS — Z954 Presence of other heart-valve replacement: Secondary | ICD-10-CM

## 2023-08-19 ENCOUNTER — Ambulatory Visit: Attending: Cardiology | Admitting: *Deleted

## 2023-08-19 DIAGNOSIS — Z7901 Long term (current) use of anticoagulants: Secondary | ICD-10-CM | POA: Diagnosis not present

## 2023-08-19 DIAGNOSIS — Z954 Presence of other heart-valve replacement: Secondary | ICD-10-CM | POA: Diagnosis not present

## 2023-08-19 DIAGNOSIS — I059 Rheumatic mitral valve disease, unspecified: Secondary | ICD-10-CM | POA: Diagnosis not present

## 2023-08-19 LAB — POCT INR: INR: 3.7 — AB (ref 2.0–3.0)

## 2023-08-19 NOTE — Patient Instructions (Signed)
 Description   Today take 1/2 tablet of warfarin then continue taking warfarin 1 tablet daily except 1/2 tablet on Mondays. Repeat INR in 5 weeks.  [facility] [phone]

## 2023-09-25 ENCOUNTER — Ambulatory Visit: Attending: Cardiology | Admitting: *Deleted

## 2023-09-25 DIAGNOSIS — Z954 Presence of other heart-valve replacement: Secondary | ICD-10-CM | POA: Diagnosis not present

## 2023-09-25 DIAGNOSIS — Z5181 Encounter for therapeutic drug level monitoring: Secondary | ICD-10-CM

## 2023-09-25 DIAGNOSIS — I059 Rheumatic mitral valve disease, unspecified: Secondary | ICD-10-CM

## 2023-09-25 DIAGNOSIS — Z7901 Long term (current) use of anticoagulants: Secondary | ICD-10-CM

## 2023-09-25 LAB — POCT INR: POC INR: 2.9

## 2023-09-25 NOTE — Patient Instructions (Addendum)
 Description   Continue taking warfarin 1 tablet daily except 1/2 tablet on Mondays. Repeat INR in 6 weeks.  [facility] [phone]

## 2023-11-06 ENCOUNTER — Ambulatory Visit

## 2023-11-12 ENCOUNTER — Ambulatory Visit: Attending: Cardiology | Admitting: *Deleted

## 2023-11-12 DIAGNOSIS — I059 Rheumatic mitral valve disease, unspecified: Secondary | ICD-10-CM

## 2023-11-12 DIAGNOSIS — Z7901 Long term (current) use of anticoagulants: Secondary | ICD-10-CM

## 2023-11-12 DIAGNOSIS — Z954 Presence of other heart-valve replacement: Secondary | ICD-10-CM

## 2023-11-12 LAB — POCT INR: INR: 2.8 (ref 2.0–3.0)

## 2023-11-12 NOTE — Patient Instructions (Signed)
 Description   Continue taking warfarin 1 tablet daily except 1/2 tablet on Mondays. Repeat INR in 6 weeks.  [facility] [phone]

## 2023-11-12 NOTE — Progress Notes (Signed)
Please see anticoagulation encounter.

## 2023-12-24 ENCOUNTER — Ambulatory Visit: Attending: Cardiology

## 2023-12-24 DIAGNOSIS — Z7901 Long term (current) use of anticoagulants: Secondary | ICD-10-CM | POA: Diagnosis not present

## 2023-12-24 DIAGNOSIS — Z954 Presence of other heart-valve replacement: Secondary | ICD-10-CM | POA: Diagnosis not present

## 2023-12-24 DIAGNOSIS — I059 Rheumatic mitral valve disease, unspecified: Secondary | ICD-10-CM | POA: Diagnosis not present

## 2023-12-24 LAB — POCT INR: INR: 3 (ref 2.0–3.0)

## 2023-12-24 NOTE — Patient Instructions (Signed)
 Description   INR 3.0 Continue taking warfarin 1 tablet daily except 1/2 tablet on Mondays. Repeat INR in 6 weeks.  [facility] [phone]

## 2023-12-24 NOTE — Progress Notes (Signed)
 Description   INR 3.0 Continue taking warfarin 1 tablet daily except 1/2 tablet on Mondays. Repeat INR in 6 weeks.  [facility] [phone]

## 2024-02-04 ENCOUNTER — Telehealth: Payer: Self-pay | Admitting: *Deleted

## 2024-02-04 ENCOUNTER — Ambulatory Visit: Attending: Cardiology

## 2024-02-04 NOTE — Telephone Encounter (Signed)
 Called patient since he missed his appointment today at the Anticoagulation Clinic, there was no answer so left a message to call back to reschedule.

## 2024-02-23 ENCOUNTER — Ambulatory Visit: Attending: Cardiology | Admitting: *Deleted

## 2024-02-23 DIAGNOSIS — Z954 Presence of other heart-valve replacement: Secondary | ICD-10-CM | POA: Diagnosis not present

## 2024-02-23 DIAGNOSIS — Z7901 Long term (current) use of anticoagulants: Secondary | ICD-10-CM

## 2024-02-23 DIAGNOSIS — I059 Rheumatic mitral valve disease, unspecified: Secondary | ICD-10-CM | POA: Diagnosis not present

## 2024-02-23 LAB — POCT INR: INR: 2.7 (ref 2.0–3.0)

## 2024-02-23 NOTE — Patient Instructions (Signed)
 Description   INR 2.7; Continue taking warfarin 1 tablet daily except 1/2 tablet on Mondays. Repeat INR in 6 weeks.  [facility] [phone]

## 2024-02-23 NOTE — Progress Notes (Signed)
 Description   INR 2.7; Continue taking warfarin 1 tablet daily except 1/2 tablet on Mondays. Repeat INR in 6 weeks.  [facility] [phone]

## 2024-03-02 ENCOUNTER — Other Ambulatory Visit: Payer: Self-pay | Admitting: Cardiology

## 2024-04-05 ENCOUNTER — Ambulatory Visit: Attending: Cardiology | Admitting: *Deleted

## 2024-04-05 DIAGNOSIS — Z954 Presence of other heart-valve replacement: Secondary | ICD-10-CM | POA: Diagnosis not present

## 2024-04-05 DIAGNOSIS — I349 Nonrheumatic mitral valve disorder, unspecified: Secondary | ICD-10-CM

## 2024-04-05 DIAGNOSIS — Z7901 Long term (current) use of anticoagulants: Secondary | ICD-10-CM

## 2024-04-05 DIAGNOSIS — I059 Rheumatic mitral valve disease, unspecified: Secondary | ICD-10-CM

## 2024-04-05 LAB — POCT INR: INR: 4.1 — AB (ref 2.0–3.0)

## 2024-04-05 NOTE — Progress Notes (Signed)
 Description   INR-4.1; Do not take any warfarin today then continue taking warfarin 1 tablet daily except 1/2 tablet on Mondays. Repeat INR in 3 weeks (normally 6 weeks).  [facility] [phone]

## 2024-04-05 NOTE — Patient Instructions (Signed)
 Description   INR-4.1; Do not take any warfarin today then continue taking warfarin 1 tablet daily except 1/2 tablet on Mondays. Repeat INR in 3 weeks (normally 6 weeks).  [facility] [phone]

## 2024-04-06 ENCOUNTER — Ambulatory Visit: Attending: Cardiology | Admitting: Cardiology

## 2024-04-06 ENCOUNTER — Encounter: Payer: Self-pay | Admitting: Cardiology

## 2024-04-06 VITALS — BP 128/70 | HR 75 | Ht 70.5 in | Wt 158.0 lb

## 2024-04-06 DIAGNOSIS — I059 Rheumatic mitral valve disease, unspecified: Secondary | ICD-10-CM

## 2024-04-06 DIAGNOSIS — Z5181 Encounter for therapeutic drug level monitoring: Secondary | ICD-10-CM

## 2024-04-06 DIAGNOSIS — I349 Nonrheumatic mitral valve disorder, unspecified: Secondary | ICD-10-CM

## 2024-04-06 DIAGNOSIS — E7800 Pure hypercholesterolemia, unspecified: Secondary | ICD-10-CM

## 2024-04-06 NOTE — Patient Instructions (Signed)
   Lab Work:  Your physician recommends that you return for lab work in: FASTING  If you have labs (blood work) drawn today and your tests are completely normal, you will receive your results only by: [employer] (if you have MyChart) OR A paper copy in the mail If you have any lab test that is abnormal or we need to change your treatment, we will call you to review the results.  Testing/Procedures:  Your physician has requested that you have an echocardiogram. Echocardiography is a painless test that uses sound waves to create images of your heart. It provides your doctor with information about the size and shape of your heart and how well your heart's chambers and valves are working. This procedure takes approximately one hour. There are no restrictions for this procedure. Please do NOT wear cologne, perfume, aftershave, or lotions (deodorant is allowed). Please arrive 15 minutes prior to your appointment time.  Please note: We ask at that you not bring children with you during ultrasound (echo/ vascular) testing. Due to room size and safety concerns, children are not allowed in the ultrasound rooms during exams. Our front office staff cannot provide observation of children in our lobby area while testing is being conducted. An adult accompanying a patient to their appointment will only be allowed in the ultrasound room at the discretion of the ultrasound technician under special circumstances. We apologize for any inconvenience. [address]  Follow-Up: At [facility], you and your health needs are our priority.  As part of our continuing mission to provide you with exceptional heart care, our providers are all part of one team.  This team includes your primary Cardiologist (physician) and Advanced Practice Providers or APPs (Physician Assistants and Nurse Practitioners) who all work together to provide you with the care you need, when you need it.  Your next appointment:    12 month(s)  Provider:   Redell Shallow, MD

## 2024-04-06 NOTE — Progress Notes (Signed)
 HPI: FU MVR. He has history of endocarditis. In November of 2009 he had mitral valve repair  via a mini thoracotomy. Transesophageal echocardiogram 3/16 revealed normal LV function, s/p MV repair with severe mitral regurgitation directed posteriorly and laterally possibly from perforated posterior mitral valve leaflet. There was a smaller second jet as well. Cath 4/16 showed EF 55, severe MR, severe pulm HTN, 40 LAD. Pt had MVR (bileaflet mechanical prosthesis) 4/16. Echocardiogram May 2016 showed mildly reduced LV function, mechanical mitral valve with no significant mitral regurgitation and mean gradient 6 mmHg. There was mild left atrial enlargement. There was mild to moderate pulmonic insufficiency. Echo 5/24 showed normal LV function, mild LAE, S/P MVR with mean gradi he denies dyspnea, chest pain, palpitations or syncope.  ent 12 mmHg and no MR. Since last seen,   Current Outpatient Medications  Medication Sig Dispense Refill   aspirin  EC 81 MG EC tablet Take 1 tablet (81 mg total) by mouth daily.     carvedilol  (COREG ) 3.125 MG tablet TAKE 1 TABLET BY MOUTH TWICE A DAY WITH FOOD 60 tablet 0   chlorhexidine  (PERIDEX ) 0.12 % solution Use as directed 15 mLs in the mouth or throat 2 (two) times daily. 120 mL 0   co-enzyme Q-10 30 MG capsule Take 30 mg by mouth daily.     Multiple Minerals (JOINT HEALTH MINERAL PO) Take 1 tablet by mouth daily.     Multiple Vitamin (MULTIVITAMIN) tablet Take 1 tablet by mouth daily.     omega-3 acid ethyl esters (LOVAZA) 1 g capsule Take by mouth 2 (two) times daily.     OVER THE COUNTER MEDICATION 1 tablet.     rosuvastatin  (CRESTOR ) 20 MG tablet Take 1 tablet (20 mg total) by mouth daily. 90 tablet 3   VITAMIN D  PO Take by mouth.     warfarin (COUMADIN ) 5 MG tablet TAKE 1/2 TABLET TO 1 TABLET BY MOUTH DAILY OR AS DIRECTED BY ANTICOAGULATION CLINIC. 90 tablet 1   ranitidine  (ZANTAC  75) 75 MG tablet Take 1 tablet (75 mg total) by mouth 2 (two) times daily.  (Patient not taking: Reported on 08/19/2022) 90 tablet 3   No current facility-administered medications for this visit.     Past Medical History:  Diagnosis Date   DDD (degenerative disc disease), lumbosacral    Hemolytic anemia 06/29/2014   History of bacterial endocarditis    07/ 2001  enterococcal//  recurrence  11/ 2009  steptococcus viridans   History of chest tube placement    1980's --- R SIDE  SPONTANEOUS  PNEUMOTHORAX   History of Clostridium difficile colitis    2009   History of periodontal disease    Prostate cancer [facility]) urologist--  dr herrick/  oncologist-- dr patrcia   Stage  T1c,  Gleason 3+4,  PSA 3.76,  vol 33.45cc   Pulmonary nodules followed by dr pietro   right upper & lower lobe and left lower lobe--  stable per CT 08-29-2014   S/P redo mitral valve replacement with metallic valve cardiologist--  dr pietro   08-11-2014--- 31 mm Sorin [employer]omed[employer] Optiform bileaflet mechanical prosthesis   Thoracic radiculopathy    T7  (positional)  per PCP note--  pt doing yoga and swimming state has helpted   Wears glasses    Wears partial dentures    upper    Past Surgical History:  Procedure Laterality Date   CARDIAC CATHETERIZATION  03-15-2008  dr morris   no sig. coronary disease/  normal LVF/  moderate pulmonary hypertension w/ elevated V wave   COLONOSCOPY  last one 12/ 2013   ESOPHAGOGASTRODUODENOSCOPY N/A 06/02/2014   Procedure: ESOPHAGOGASTRODUODENOSCOPY (EGD);  Surgeon: Gordy CHRISTELLA Starch, MD;  Location: [facility] ENDOSCOPY;  Service: Endoscopy;  Laterality: N/A;   LEFT AND RIGHT HEART CATHETERIZATION WITH CORONARY ANGIOGRAM N/A 08/10/2014   Procedure: LEFT AND RIGHT HEART CATHETERIZATION WITH CORONARY ANGIOGRAM;  Surgeon: Victory LELON Sharps, MD;  Location: [facility] CATH LAB;  Service: Cardiovascular;  Laterality: N/A;   EF 55%,  severe MR, severe pulm HTN,  40% LAD   MITRAL VALVE REPAIR  03-22-2008   via  right mini thoracotomy   MITRAL VALVE REPAIR N/A 08/11/2014   Procedure: REDO  MITRAL VALVE (MV) REPLACEMENT;  Surgeon: Sudie VEAR Laine, MD;  Location: [facility] OR;  Service: Open Heart Surgery;  Laterality: N/A;  NO NECK LINES ON RIGHT SIDE   MULTIPLE TOOTH EXTRACTIONS  03-16-2008    w/ alveoloplasty   PROSTATE BIOPSY  03/2014   RADIOACTIVE SEED IMPLANT N/A 03/17/2015   Procedure: RADIOACTIVE SEED IMPLANT/BRACHYTHERAPY IMPLANT;  Surgeon: Morene LELON Salines, MD;  Location[facility];  Service: Urology;  Laterality: N/A;  DR PORTABLE   TEE WITHOUT CARDIOVERSION N/A 07/19/2014   Procedure: TRANSESOPHAGEAL ECHOCARDIOGRAM (TEE);  Surgeon: Redell GORMAN Shallow, MD;  Location: [facility] ENDOSCOPY;  Service: Cardiovascular;  Laterality: N/A;   TEE WITHOUT CARDIOVERSION N/A 08/11/2014   Procedure: TRANSESOPHAGEAL ECHOCARDIOGRAM (TEE);  Surgeon: Sudie VEAR Laine, MD;  Location: [facility] OR;  Service: Open Heart Surgery;  Laterality: N/A;   TRANSTHORACIC ECHOCARDIOGRAM  09-23-2014   dr  shallow   mild focal basal LVH of the septum,  ef 45-50%,  normal wall motion/  mechanical bileaflet MV sits well in mitrial position, no sig. 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    Employer: [employer]    Comment: for [employer]  Tobacco Use   Smoking status: Former    Current packs/day: 0.00    Average packs/day: 2.0 packs/day for 15.0 years (30.0 ttl pk-yrs)    Types: Cigarettes    Start date: 08/09/1974    Quit date: 08/08/1989    Years since quitting: 34.6   Smokeless tobacco: Never  Vaping Use   Vaping status: Never Used  Substance and Sexual Activity   Alcohol use: No   Drug  use: No    Types: Cocaine, Heroin, Marijuana    Comment: stopped using drugs in ~ 1984   Sexual activity: Not on file  Other Topics Concern   Not on file  Social History Narrative   REGULAR EXERCISE 2X W[employer] TREADMILL,WEIGHTS   Social Drivers of Health   Financial Resource Strain: Not on file  Food Insecurity: Not on file  Transportation Needs: Not on file  Physical Activity: Not on file  Stress: Not on file  Social Connections: Not on file  Intimate Partner Violence: Not on file    Family History  Problem Relation Age of Onset   Ovarian cancer Sister    Alzheimer's disease Father  Diabetes Neg Hx    Heart disease Neg Hx    Stroke Neg Hx     ROS: no fevers or chills, productive cough, hemoptysis, dysphasia, odynophagia, melena, hematochezia, dysuria, hematuria, rash, seizure activity, orthopnea, PND, pedal edema, claudication. Remaining systems are negative.  Physical Exam: Well-developed well-nourished in no acute distress.  Skin is warm and dry.  HEENT is normal.  Neck is supple.  Chest is clear to auscultation with normal expansion.  Cardiovascular exam is regular rate and rhythm.  Crisp mechanical valve sound Abdominal exam nontender or distended. No masses palpated. Extremities show no edema. neuro grossly intact  EKG Interpretation Date/Time:  Tuesday April 06 2024 11:33:42 EST Ventricular Rate:  75 PR Interval:  212 QRS Duration:  138 QT Interval:  410 QTC Calculation: 457 R Axis:   43  Text Interpretation: Sinus rhythm with 1st degree A-V block Left bundle branch block Confirmed by [name] Rogue ([id number]) on 04/06/2024 11:40:52 AM    A/P  1 status post mitral valve replacement-continue Coumadin  and aspirin .  Continue SBE prophylaxis.  Elevated gradient noted on most recent echo. Will plan repeat study.  Check hemoglobin.  Will also check renal function.   2 hyperlipidemia-check lipids.  Check lipids and liver.   3 history of mildly reduced LV  function-LV function normal on most recent echo.   Rogue [name], MD

## 2024-04-14 ENCOUNTER — Encounter (HOSPITAL_COMMUNITY): Payer: Self-pay

## 2024-04-14 ENCOUNTER — Emergency Department (HOSPITAL_COMMUNITY)
Admission: EM | Admit: 2024-04-14 | Discharge: 2024-04-14 | Disposition: A | Discharge status: Home / Self Care | Source: Home / Self Care | Attending: Emergency Medicine | Admitting: Emergency Medicine

## 2024-04-14 ENCOUNTER — Other Ambulatory Visit: Payer: Self-pay

## 2024-04-14 DIAGNOSIS — Z8546 Personal history of malignant neoplasm of prostate: Secondary | ICD-10-CM | POA: Insufficient documentation

## 2024-04-14 DIAGNOSIS — Z7982 Long term (current) use of aspirin: Secondary | ICD-10-CM | POA: Insufficient documentation

## 2024-04-14 DIAGNOSIS — R040 Epistaxis: Secondary | ICD-10-CM | POA: Insufficient documentation

## 2024-04-14 DIAGNOSIS — Z7901 Long term (current) use of anticoagulants: Secondary | ICD-10-CM | POA: Insufficient documentation

## 2024-04-14 LAB — CBC WITH DIFFERENTIAL/PLATELET
Abs Immature Granulocytes: 0.04 K/uL (ref 0.00–0.07)
Basophils Absolute: 0 K/uL (ref 0.0–0.1)
Basophils Relative: 1 %
Eosinophils Absolute: 0.1 K/uL (ref 0.0–0.5)
Eosinophils Relative: 2 %
HCT: 32.3 % — ABNORMAL LOW (ref 39.0–52.0)
Hemoglobin: 10.5 g/dL — ABNORMAL LOW (ref 13.0–17.0)
Immature Granulocytes: 1 %
Lymphocytes Relative: 21 %
Lymphs Abs: 0.9 K/uL (ref 0.7–4.0)
MCH: 29.8 pg (ref 26.0–34.0)
MCHC: 32.5 g/dL (ref 30.0–36.0)
MCV: 91.8 fL (ref 80.0–100.0)
Monocytes Absolute: 0.3 K/uL (ref 0.1–1.0)
Monocytes Relative: 8 %
Neutro Abs: 2.7 K/uL (ref 1.7–7.7)
Neutrophils Relative %: 67 %
Platelets: 113 K/uL — ABNORMAL LOW (ref 150–400)
RBC: 3.52 MIL/uL — ABNORMAL LOW (ref 4.22–5.81)
RDW: 14.9 % (ref 11.5–15.5)
WBC: 4.1 K/uL (ref 4.0–10.5)
nRBC: 0 % (ref 0.0–0.2)

## 2024-04-14 LAB — BASIC METABOLIC PANEL WITH GFR
Anion gap: 11 (ref 5–15)
BUN: 20 mg/dL (ref 8–23)
CO2: 18 mmol/L — ABNORMAL LOW (ref 22–32)
Calcium: 9.1 mg/dL (ref 8.9–10.3)
Chloride: 105 mmol/L (ref 98–111)
Creatinine, Ser: 0.81 mg/dL (ref 0.61–1.24)
GFR, Estimated: >60 mL/min (ref >60)
Glucose, Bld: 97 mg/dL (ref 70–99)
Potassium: 4.6 mmol/L (ref 3.5–5.1)
Sodium: 135 mmol/L (ref 135–145)

## 2024-04-14 LAB — APTT: aPTT: 37 s — ABNORMAL HIGH (ref 24–36)

## 2024-04-14 LAB — PROTIME-INR
INR: 3.7 — ABNORMAL HIGH (ref 0.8–1.2)
Prothrombin Time: 38.3 s — ABNORMAL HIGH (ref 11.4–15.2)

## 2024-04-14 MED ORDER — BACITRACIN ZINC 500 UNIT/GM EX OINT
TOPICAL_OINTMENT | CUTANEOUS | Status: AC
  Filled 2024-04-14: qty 0.9

## 2024-04-14 MED ORDER — OXYMETAZOLINE HCL 0.05 % NA SOLN
1.0000 | Freq: Once | NASAL | Status: AC
  Administered 2024-04-14: 11:00:00 1 via NASAL
  Filled 2024-04-14: qty 30

## 2024-04-14 NOTE — Discharge Instructions (Addendum)
 You were evaluated in the emergency room for nosebleed.  Your INR today was 3.7.  Please hold your Coumadin  tonight.  You may resume tomorrow at 2.5 followed by 5 the days thereafter.  Please call to set up an appointment for recheck INR on Monday.  If you experience persistent worsening bleeding you may return to the emergency room.

## 2024-04-14 NOTE — ED Notes (Signed)
 Poked patient, drew 2 of 3 tubes, vein blew, patient wouldn't let me poke him again. Triage RN notified. KIT

## 2024-04-14 NOTE — ED Provider Triage Note (Signed)
 Emergency Medicine Provider Triage Evaluation Note  David [name] , a 63 y.o. male  was evaluated in triage.  Pt complains of R sided epistaxis starting this AM, Hx of epistaxis, on warfarin. No reported missed doses.   Hx of mechanical mitral valve.   Denies fever, headache, vision change, vertigo, chest pain, shortness of breath, odynophagia, n/v.   Review of Systems  Positive: N/a Negative: N/a  Physical Exam  BP (!) 148/81 (BP Location: Right Arm)   Pulse 87   Temp 97.7 F (36.5 C) (Oral)   Resp 14   SpO2 100%  Gen:   Awake, no distress   Resp:  Normal effort MSK:   Moves extremities without difficulty  Other:    Medical Decision Making  Medically screening exam initiated at 10:18 AM.  Appropriate orders placed.  David [name] was informed that the remainder of the evaluation will be completed by another provider, this initial triage assessment does not replace that evaluation, and the importance of remaining in the ED until their evaluation is complete.     David [name], [place] 04/14/24 1021

## 2024-04-14 NOTE — ED Triage Notes (Signed)
 Patient had nosebleed that started this am has had same in past. Here because keep bleeding. Bleeding intermittent from right side

## 2024-04-14 NOTE — ED Provider Notes (Signed)
 [place] EMERGENCY DEPARTMENT AT [name] Provider Note   CSN: [id number] Arrival date & time: 04/14/24  9080     Patient presents with: No chief complaint on file.   [name] is a 63 y.o. male history of endocarditis status post MV replacement on Coumadin  presents with complaints of epistaxis that started this morning.  Has a history of epistaxis.  No injury or trauma.     HPI    Past Medical History:  Diagnosis Date   DDD (degenerative disc disease), lumbosacral    Hemolytic anemia 06/29/2014   History of bacterial endocarditis    07/ 2001  enterococcal//  recurrence  11/ 2009  steptococcus viridans   History of chest tube placement    1980's --- R SIDE  SPONTANEOUS  PNEUMOTHORAX   History of Clostridium difficile colitis    2009   History of periodontal disease    Prostate cancer [name]) urologist--  dr herrick/  oncologist-- dr patrcia   Stage  T1c,  Gleason 3+4,  PSA 3.76,  vol 33.45cc   Pulmonary nodules followed by dr [name]   right upper & lower lobe and left lower lobe--  stable per CT 08-29-2014   S/P redo mitral valve replacement with metallic valve cardiologist--  dr [name]   08-11-2014--- 31 mm Sorin [employer]omed[employer] Optiform bileaflet mechanical prosthesis   Thoracic radiculopathy    T7  (positional)  per PCP note--  pt doing yoga and swimming state has helpted   Wears glasses    Wears partial dentures    upper   Past Surgical History:  Procedure Laterality Date   CARDIAC CATHETERIZATION  03-15-2008  dr morris   no sig. coronary disease/  normal LVF/  moderate pulmonary hypertension w/ elevated V wave   COLONOSCOPY  last one 12/ 2013   ESOPHAGOGASTRODUODENOSCOPY N/A 06/02/2014   Procedure: ESOPHAGOGASTRODUODENOSCOPY (EGD);  Surgeon: David CHRISTELLA Starch, MD;  Location: [facility] ENDOSCOPY;  Service: Endoscopy;  Laterality: N/A;   LEFT AND RIGHT HEART CATHETERIZATION WITH CORONARY ANGIOGRAM N/A 08/10/2014   Procedure: LEFT AND RIGHT HEART CATHETERIZATION  WITH CORONARY ANGIOGRAM;  Surgeon: David LELON Sharps, MD;  Location: [name] CATH LAB;  Service: Cardiovascular;  Laterality: N/A;   EF 55%,  severe MR, severe pulm HTN,  40% LAD   MITRAL VALVE REPAIR  03-22-2008   via  right mini thoracotomy   MITRAL VALVE REPAIR N/A 08/11/2014   Procedure: REDO MITRAL VALVE (MV) REPLACEMENT;  Surgeon: David VEAR Laine, MD;  Location: [facility] OR;  Service: Open Heart Surgery;  Laterality: N/A;  NO NECK LINES ON RIGHT SIDE   MULTIPLE TOOTH EXTRACTIONS  03-16-2008    w/ alveoloplasty   PROSTATE BIOPSY  03/2014   RADIOACTIVE SEED IMPLANT N/A 03/17/2015   Procedure: RADIOACTIVE SEED IMPLANT/BRACHYTHERAPY IMPLANT;  Surgeon: David LELON Salines, MD;  Location: [facility];  Service: Urology;  Laterality: N/A;  DR PORTABLE   TEE WITHOUT CARDIOVERSION N/A 07/19/2014   Procedure: TRANSESOPHAGEAL ECHOCARDIOGRAM (TEE);  Surgeon: David GORMAN [name], MD;  Location: [facility] ENDOSCOPY;  Service: Cardiovascular;  Laterality: N/A;   TEE WITHOUT CARDIOVERSION N/A 08/11/2014   Procedure: TRANSESOPHAGEAL ECHOCARDIOGRAM (TEE);  Surgeon: David VEAR Laine, MD;  Location: [name];  Service: Open Heart Surgery;  Laterality: N/A;   TRANSTHORACIC ECHOCARDIOGRAM  09-23-2014   dr  [name]   mild focal basal LVH of the septum,  ef 45-50%,  normal wall motion/  mechanical bileaflet MV sits well in mitrial position, no sig. regurg., valve area  1.61 cm^2,  mean grandient 22mmHg/  mild LAE/  RA prominent Chiari malformation , unchanged from prior study, normal size/  mild TR/  mild to moderate pulmonic insuffiency     Prior to Admission medications  Medication Sig Start Date End Date Taking? Authorizing Provider  aspirin  EC 81 MG EC tablet Take 1 tablet (81 mg total) by mouth daily. 08/17/14   Barrett, Erin R, PA-C  carvedilol  (COREG ) 3.125 MG tablet TAKE 1 TABLET BY MOUTH TWICE A DAY WITH FOOD 03/03/24   [name] David RAMAN, MD  chlorhexidine  (PERIDEX ) 0.12 % solution Use as directed 15 mLs in the mouth or  throat 2 (two) times daily. 12/06/21   LampteyAleene KIDD, MD  co-enzyme Q-10 30 MG capsule Take 30 mg by mouth daily.    [provider]  Multiple Minerals (JOINT HEALTH MINERAL PO) Take 1 tablet by mouth daily.    [provider]  Multiple Vitamin (MULTIVITAMIN) tablet Take 1 tablet by mouth daily.    [provider]  omega-3 acid ethyl esters (LOVAZA) 1 g capsule Take by mouth 2 (two) times daily.    [provider]  OVER THE COUNTER MEDICATION 1 tablet.    [provider]  ranitidine  (ZANTAC  75) 75 MG tablet Take 1 tablet (75 mg total) by mouth 2 (two) times daily. Patient not taking: Reported on 08/19/2022 03/13/18   David Elsie Sayre, MD  rosuvastatin  (CRESTOR ) 20 MG tablet Take 1 tablet (20 mg total) by mouth daily. 08/22/22   [name] David RAMAN, MD  VITAMIN D  PO Take by mouth.    [provider]  warfarin (COUMADIN ) 5 MG tablet TAKE 1/2 TABLET TO 1 TABLET BY MOUTH DAILY OR AS DIRECTED BY ANTICOAGULATION CLINIC. 08/13/23   [name] David RAMAN, MD    Allergies: Clarithromycin    Review of Systems  HENT:  Positive for nosebleeds.     Updated Vital Signs BP 133/66 (BP Location: Right Arm)   Pulse 87   Temp (!) 97.5 F (36.4 C) (Oral)   Resp 16   SpO2 100%   Physical Exam Vitals and nursing note reviewed.  Constitutional:      General: He is not in acute distress.    Appearance: He is well-developed.  HENT:     Head: Normocephalic and atraumatic.     Comments: Right-sided epistaxis Eyes:     Conjunctiva/sclera: Conjunctivae normal.  Cardiovascular:     Rate and Rhythm: Normal rate and regular rhythm.     Heart sounds: No murmur heard. Pulmonary:     Effort: Pulmonary effort is normal. No respiratory distress.     Breath sounds: Normal breath sounds.  Abdominal:     Palpations: Abdomen is soft.     Tenderness: There is no abdominal tenderness.  Musculoskeletal:        General: No swelling.     Cervical back: Neck  supple.  Skin:    General: Skin is warm and dry.     Capillary Refill: Capillary refill takes less than 2 seconds.  Neurological:     Mental Status: He is alert.  Psychiatric:        Mood and Affect: Mood normal.     (all labs ordered are listed, but only abnormal results are displayed) Labs Reviewed  BASIC METABOLIC PANEL WITH GFR - Abnormal; Notable for the following components:      Result Value   CO2 18 (*)    All other components within normal limits  PROTIME-INR -  Abnormal; Notable for the following components:   Prothrombin Time 38.3 (*)    INR 3.7 (*)    All other components within normal limits  APTT - Abnormal; Notable for the following components:   aPTT 37 (*)    All other components within normal limits  CBC WITH DIFFERENTIAL/PLATELET - Abnormal; Notable for the following components:   RBC 3.52 (*)    Hemoglobin 10.5 (*)    HCT 32.3 (*)    Platelets 113 (*)    All other components within normal limits  CBC WITH DIFFERENTIAL/PLATELET    EKG: None  Radiology: No results found.   Procedures   Medications Ordered in the ED  bacitracin  500 UNIT/GM ointment (has no administration in time range)  oxymetazoline  (AFRIN) 0.05 % nasal spray 1 spray (1 spray Each Nare Given 04/14/24 1049)    Clinical Course as of 04/14/24 1450  Wed Apr 14, 2024  1427 Patient on warfarin presents with complaints of right sided epistaxis that started earlier this morning.  Upon arrival patient is hemodynamically stable.  Patient was evaluated in triage initially treated with digital pressure and Afrin.  With persistent bleeding was treated with tampon.  After this was removed, bleeding was noted to have ceased.  He was monitored for over an hour and has not had any recurrent bleed.  His hemoglobin is 10.5.  INR is 3.7, was 4.1 a week ago.  Ultimately patient will be discharged home.  He has had recurrent bleeds will provide ENT follow-up.  Encouraged to follow-up with warfarin clinic  as well.  Patient is understanding an in agreement with plan. [JT]  1449 Discussed INR management with [facility].  Recommended holding Coumadin  tonight, resuming 2.5 tomorrow followed by 5 thereafter and close follow-up on Monday for INR recheck. [JT]    Clinical Course User Index [JT] Donnajean Lynwood DEL, PA-C                                 Medical Decision Making  This patient presents to the ED with chief complaint(s) of Epistaxis .  The complaint involves an extensive differential diagnosis and also carries with it a high risk of complications and morbidity.   Pertinent past medical history as listed in HPI  The differential diagnosis includes  Trauma, subtherapeutic INR, anemia Additional history obtained: Records reviewed Care Everywhere/External Records  Disposition:   Patient will be discharged home. The patient has been appropriately medically screened and/or stabilized in the ED. I have low suspicion for any other emergent medical condition which would require further screening, evaluation or treatment in the ED or require inpatient management. At time of discharge the patient is hemodynamically stable and in no acute distress. I have discussed work-up results and diagnosis with patient and answered all questions. Patient is agreeable with discharge plan. We discussed strict return precautions for returning to the emergency department and they verbalized understanding.     Social Determinants of Health:   none  This note was dictated with voice recognition software.  Despite best efforts at proofreading, errors may have occurred which can change the documentation meaning.       Final diagnoses:  Epistaxis    ED Discharge Orders     None          Donnajean Lynwood DEL, PA-C 04/14/24 1451    Franklyn Sid SAILOR, MD 04/14/24 [phone]

## 2024-04-16 ENCOUNTER — Ambulatory Visit (HOSPITAL_COMMUNITY)
Admission: EM | Admit: 2024-04-16 | Discharge: 2024-04-16 | Disposition: A | Discharge status: Home / Self Care | Attending: Emergency Medicine | Admitting: Emergency Medicine

## 2024-04-16 DIAGNOSIS — M545 Low back pain, unspecified: Secondary | ICD-10-CM | POA: Diagnosis not present

## 2024-04-16 MED ORDER — LIDOCAINE 5 % EX PTCH: 1.0000 | MEDICATED_PATCH | CUTANEOUS | 0 refills | Status: DC

## 2024-04-16 MED ORDER — BACLOFEN 5 MG PO TABS: 5.0000 mg | ORAL_TABLET | Freq: Three times a day (TID) | ORAL | 0 refills | Status: DC

## 2024-04-16 NOTE — Discharge Instructions (Signed)
 As discussed I do believe your pain is likely muscular in nature. You can take 1 tablet of baclofen every 8 hours as needed for muscle pain and spasms.  This can make you drowsy so do not drive, work, or drink alcohol while taking this. You can apply lidocaine  patch 12 hours at a time for additional pain relief. Otherwise take 500 to 1000 mg of Tylenol  every 6-8 hours as needed for pain. Alternate between ice and heat and do some gentle stretching to help with pain. Follow-up with your primary care provider or return here as needed.

## 2024-04-16 NOTE — ED Provider Notes (Signed)
 " [facility]    CSN: [id number] Arrival date & time: 04/16/24  1335      History   Chief Complaint Chief Complaint  Patient presents with   Back Pain    HPI [name] is a 63 y.o. male.   Patient presents with left low back pain that began today while he was at work.  Patient states that he does do a lot of heavy lifting at work and wonders if this could be related.  Patient denies any falls or known injuries.  Patient denies taking any medication for his pain.  Of note patient is currently on Coumadin .  Patient denies any numbness, tingling, or pain that radiates down his legs.  Patient denies saddle anesthesia or bowel/bladder incontinence.  The history is provided by the patient and medical records.  Back Pain   Past Medical History:  Diagnosis Date   DDD (degenerative disc disease), lumbosacral    Hemolytic anemia 06/29/2014   History of bacterial endocarditis    07/ 2001  enterococcal//  recurrence  11/ 2009  steptococcus viridans   History of chest tube placement    1980's --- R SIDE  SPONTANEOUS  PNEUMOTHORAX   History of Clostridium difficile colitis    2009   History of periodontal disease    Prostate cancer [facility]) urologist--  dr herrick/  oncologist-- dr patrcia   Stage  T1c,  Gleason 3+4,  PSA 3.76,  vol 33.45cc   Pulmonary nodules followed by dr pietro   right upper & lower lobe and left lower lobe--  stable per CT 08-29-2014   S/P redo mitral valve replacement with metallic valve cardiologist--  dr pietro   08-11-2014--- 31 mm Sorin [employer] Optiform bileaflet mechanical prosthesis   Thoracic radiculopathy    T7  (positional)  per PCP note--  pt doing yoga and swimming state has helpted   Wears glasses    Wears partial dentures    upper    Patient Active Problem List   Diagnosis Date Noted   Epidermal inclusion cyst 03/11/2018   Neutropenia 08/26/2017   Healthcare maintenance 03/08/2016   Strain of lumbar paraspinal muscle  08/01/2015   Left knee pain 08/01/2015   Long term current use of anticoagulant therapy 08/19/2014   S/P redo mitral valve replacement with metallic valve 08/11/2014   Mitral valve regurgitation 08/09/2014   Severe mitral regurgitation 08/03/2014   Hx of mitral valve repair 08/03/2014   Mitral regurgitation 07/19/2014   Hemolytic anemia 06/29/2014   Cystitis 06/23/2014   Malignant neoplasm of prostate (HCC) 06/23/2014   Nodule of right lung 06/23/2014   Symptomatic anemia 06/21/2014   Prostate cancer (HCC) 06/07/2014   Microcytic anemia    Hyperlipidemia 05/31/2014   Nonspecific abnormal electrocardiogram (ECG) (EKG) 11/19/2011   HLD (hyperlipidemia) 01/12/2010   GERD 01/12/2010   HIATAL HERNIA 05/12/2009   Mitral valve disorder 06/29/2008    Past Surgical History:  Procedure Laterality Date   CARDIAC CATHETERIZATION  03-15-2008  dr morris   no sig. coronary disease/  normal LVF/  moderate pulmonary hypertension w/ elevated V wave   COLONOSCOPY  last one 12/ 2013   ESOPHAGOGASTRODUODENOSCOPY N/A 06/02/2014   Procedure: ESOPHAGOGASTRODUODENOSCOPY (EGD);  Surgeon: Gordy CHRISTELLA Starch, MD;  Location: [facility] ENDOSCOPY;  Service: Endoscopy;  Laterality: N/A;   LEFT AND RIGHT HEART CATHETERIZATION WITH CORONARY ANGIOGRAM N/A 08/10/2014   Procedure: LEFT AND RIGHT HEART CATHETERIZATION WITH CORONARY ANGIOGRAM;  Surgeon: Victory LELON Sharps, MD;  Location: [facility] CATH  LAB;  Service: Cardiovascular;  Laterality: N/A;   EF 55%,  severe MR, severe pulm HTN,  40% LAD   MITRAL VALVE REPAIR  03-22-2008   via  right mini thoracotomy   MITRAL VALVE REPAIR N/A 08/11/2014   Procedure: REDO MITRAL VALVE (MV) REPLACEMENT;  Surgeon: Sudie VEAR Laine, MD;  Location: [facility] OR;  Service: Open Heart Surgery;  Laterality: N/A;  NO NECK LINES ON RIGHT SIDE   MULTIPLE TOOTH EXTRACTIONS  03-16-2008    w/ alveoloplasty   PROSTATE BIOPSY  03/2014   RADIOACTIVE SEED IMPLANT N/A 03/17/2015   Procedure: RADIOACTIVE SEED  IMPLANT/BRACHYTHERAPY IMPLANT;  Surgeon: Morene LELON Salines, MD;  Location: [facility];  Service: Urology;  Laterality: N/A;  DR PORTABLE   TEE WITHOUT CARDIOVERSION N/A 07/19/2014   Procedure: TRANSESOPHAGEAL ECHOCARDIOGRAM (TEE);  Surgeon: Redell GORMAN Shallow, MD;  Location: [facility] ENDOSCOPY;  Service: Cardiovascular;  Laterality: N/A;   TEE WITHOUT CARDIOVERSION N/A 08/11/2014   Procedure: TRANSESOPHAGEAL ECHOCARDIOGRAM (TEE);  Surgeon: Sudie VEAR Laine, MD;  Location: [facility] OR;  Service: Open Heart Surgery;  Laterality: N/A;   TRANSTHORACIC ECHOCARDIOGRAM  09-23-2014   dr  shallow   mild focal basal LVH of the septum,  ef 45-50%,  normal wall motion/  mechanical bileaflet MV sits well in mitrial position, no sig. regurg., valve area 1.61 cm^2,  mean grandient 50mmHg/  mild LAE/  RA prominent Chiari malformation , unchanged from prior study, normal size/  mild TR/  mild to moderate pulmonic insuffiency       Home Medications    Prior to Admission medications  Medication Sig Start Date End Date Taking? Authorizing Provider  baclofen 5 MG TABS Take 1 tablet (5 mg total) by mouth 3 (three) times daily. 04/16/24  Yes Thy Gullikson A, NP  lidocaine  (LIDODERM ) 5 % Place 1 patch onto the skin daily. Remove & Discard patch within 12 hours or as directed by MD 04/16/24  Yes Johnie Flaming A, NP  aspirin  EC 81 MG EC tablet Take 1 tablet (81 mg total) by mouth daily. 08/17/14   Barrett, Erin R, PA-C  carvedilol  (COREG ) 3.125 MG tablet TAKE 1 TABLET BY MOUTH TWICE A DAY WITH FOOD 03/03/24   Shallow Redell GORMAN, MD  chlorhexidine  (PERIDEX ) 0.12 % solution Use as directed 15 mLs in the mouth or throat 2 (two) times daily. 12/06/21   LampteyAleene KIDD, MD  co-enzyme Q-10 30 MG capsule Take 30 mg by mouth daily.    [provider]  Multiple Minerals (JOINT HEALTH MINERAL PO) Take 1 tablet by mouth daily.    [provider]  Multiple Vitamin (MULTIVITAMIN) tablet Take 1 tablet by mouth  daily.    [provider]  omega-3 acid ethyl esters (LOVAZA) 1 g capsule Take by mouth 2 (two) times daily.    [provider]  OVER THE COUNTER MEDICATION 1 tablet.    [provider]  ranitidine  (ZANTAC  75) 75 MG tablet Take 1 tablet (75 mg total) by mouth 2 (two) times daily. Patient not taking: Reported on 08/19/2022 03/13/18   Berneta Elsie Sayre, MD  rosuvastatin  (CRESTOR ) 20 MG tablet Take 1 tablet (20 mg total) by mouth daily. 08/22/22   Shallow Redell GORMAN, MD  VITAMIN D  PO Take by mouth.    [provider]  warfarin (COUMADIN ) 5 MG tablet TAKE 1/2 TABLET TO 1 TABLET BY MOUTH DAILY OR AS DIRECTED BY ANTICOAGULATION CLINIC. 08/13/23   Shallow Redell GORMAN, MD  Family History Family History  Problem Relation Age of Onset   Ovarian cancer Sister    Alzheimer's disease Father    Diabetes Neg Hx    Heart disease Neg Hx    Stroke Neg Hx     Social History Social History[1]   Allergies   Clarithromycin   Review of Systems Review of Systems  Musculoskeletal:  Positive for back pain.   Per HPI  Physical Exam Triage Vital Signs ED Triage Vitals  Encounter Vitals Group     BP 04/16/24 1434 132/67     Girls Systolic BP Percentile --      Girls Diastolic BP Percentile --      Boys Systolic BP Percentile --      Boys Diastolic BP Percentile --      Pulse Rate 04/16/24 1433 60     Resp 04/16/24 1433 17     Temp 04/16/24 1433 98.4 F (36.9 C)     Temp Source 04/16/24 1433 Oral     SpO2 04/16/24 1433 98 %     Weight --      Height --      Head Circumference --      Peak Flow --      Pain Score 04/16/24 1432 6     Pain Loc --      Pain Education --      Exclude from Growth Chart --    No data found.  Updated Vital Signs BP 132/67   Pulse 60   Temp 98.4 F (36.9 C) (Oral)   Resp 17   SpO2 98%   Visual Acuity Right Eye Distance:   Left Eye Distance:   Bilateral Distance:    Right Eye Near:   Left Eye Near:    Bilateral  Near:     Physical Exam Vitals and nursing note reviewed.  Constitutional:      General: He is awake. He is not in acute distress.    Appearance: Normal appearance. He is well-developed and well-groomed. He is not ill-appearing.  Musculoskeletal:     Cervical back: Normal.     Thoracic back: Normal.     Lumbar back: Tenderness present. No swelling, edema, deformity, signs of trauma or bony tenderness. Normal range of motion. Negative right straight leg raise test and negative left straight leg raise test.       Back:     Comments: Tenderness noted to left low back without spinous process tenderness.  Skin:    General: Skin is warm and dry.  Neurological:     Mental Status: He is alert.  Psychiatric:        Behavior: Behavior is cooperative.      UC Treatments / Results  Labs (all labs ordered are listed, but only abnormal results are displayed) Labs Reviewed - No data to display  EKG   Radiology No results found.  Procedures Procedures (including critical care time)  Medications Ordered in UC Medications - No data to display  Initial Impression / Assessment and Plan / UC Course  I have reviewed the triage vital signs and the nursing notes.  Pertinent labs & imaging results that were available during my care of the patient were reviewed by me and considered in my medical decision making (see chart for details).     Patient is overall well-appearing.  Vitals are stable.  Back pain likely muscular in nature.  Prescribed low-dose baclofen  for this.  Prescribed lidocaine  patches for additional relief.  Recommended Tylenol  for additional relief.  Discussed follow-up and return precautions. Final Clinical Impressions(s) / UC Diagnoses   Final diagnoses:  Acute left-sided low back pain without sciatica     Discharge Instructions      As discussed I do believe your pain is likely muscular in nature. You can take 1 tablet of baclofen every 8 hours as needed for  muscle pain and spasms.  This can make you drowsy so do not drive, work, or drink alcohol while taking this. You can apply lidocaine  patch 12 hours at a time for additional pain relief. Otherwise take 500 to 1000 mg of Tylenol  every 6-8 hours as needed for pain. Alternate between ice and heat and do some gentle stretching to help with pain. Follow-up with your primary care provider or return here as needed.   ED Prescriptions     Medication Sig Dispense Auth. Provider   lidocaine  (LIDODERM ) 5 % Place 1 patch onto the skin daily. Remove & Discard patch within 12 hours or as directed by MD 30 patch Johnie Flaming A, NP   baclofen 5 MG TABS Take 1 tablet (5 mg total) by mouth 3 (three) times daily. 30 tablet Johnie Flaming A, NP      PDMP not reviewed this encounter.    [1]  Social History Tobacco Use   Smoking status: Former    Current packs/day: 0.00    Average packs/day: 2.0 packs/day for 15.0 years (30.0 ttl pk-yrs)    Types: Cigarettes    Start date: 08/09/1974    Quit date: 08/08/1989    Years since quitting: 34.7   Smokeless tobacco: Never  Vaping Use   Vaping status: Never Used  Substance Use Topics   Alcohol use: No   Drug use: No    Types: Cocaine, Heroin, Marijuana    Comment: stopped using drugs in ~ 1984     Johnie Flaming A, NP 04/16/24 1527  "

## 2024-04-16 NOTE — ED Triage Notes (Signed)
 Pt present with c/o back pain that started today around 1200. Pt states the pain stays in the lower back. States he has not taken anything for relief.

## 2024-04-26 ENCOUNTER — Ambulatory Visit: Attending: Cardiology

## 2024-04-26 DIAGNOSIS — Z7901 Long term (current) use of anticoagulants: Secondary | ICD-10-CM | POA: Diagnosis not present

## 2024-04-26 DIAGNOSIS — I349 Nonrheumatic mitral valve disorder, unspecified: Secondary | ICD-10-CM

## 2024-04-26 DIAGNOSIS — Z954 Presence of other heart-valve replacement: Secondary | ICD-10-CM | POA: Diagnosis not present

## 2024-04-26 DIAGNOSIS — I059 Rheumatic mitral valve disease, unspecified: Secondary | ICD-10-CM

## 2024-04-26 LAB — POCT INR: INR: 5.8 — AB (ref 2.0–3.0)

## 2024-04-26 LAB — CBC

## 2024-04-26 NOTE — Progress Notes (Signed)
"   INR 5.8 Please see anticoagulation encounter Do not take any warfarin today, tomorrow or Wednesday then continue taking warfarin 1 tablet daily except 1/2 tablet on Mondays. Repeat INR in 2 weeks (normally 6 weeks).  Coumadin  Clinic [phone] "

## 2024-04-26 NOTE — Patient Instructions (Signed)
 Do not take any warfarin today, tomorrow or Wednesday then continue taking warfarin 1 tablet daily except 1/2 tablet on Mondays. Repeat INR in 2 weeks (normally 6 weeks).  Coumadin  Clinic [phone]

## 2024-04-27 ENCOUNTER — Ambulatory Visit: Payer: Self-pay | Admitting: Cardiology

## 2024-04-27 DIAGNOSIS — R748 Abnormal levels of other serum enzymes: Secondary | ICD-10-CM

## 2024-04-27 LAB — COMPREHENSIVE METABOLIC PANEL WITH GFR
ALT: 13 IU/L (ref 0–44)
AST: 29 IU/L (ref 0–40)
Albumin: 4.4 g/dL (ref 3.9–4.9)
Alkaline Phosphatase: 797 IU/L — ABNORMAL HIGH (ref 47–123)
BUN/Creatinine Ratio: 12 (ref 10–24)
BUN: 11 mg/dL (ref 8–27)
Bilirubin Total: 0.3 mg/dL (ref 0.0–1.2)
CO2: 20 mmol/L (ref 20–29)
Calcium: 9.4 mg/dL (ref 8.6–10.2)
Chloride: 102 mmol/L (ref 96–106)
Creatinine, Ser: 0.91 mg/dL (ref 0.76–1.27)
Globulin, Total: 2.4 g/dL (ref 1.5–4.5)
Glucose: 82 mg/dL (ref 70–99)
Potassium: 4.8 mmol/L (ref 3.5–5.2)
Sodium: 140 mmol/L (ref 134–144)
Total Protein: 6.8 g/dL (ref 6.0–8.5)
eGFR: 94 mL/min/1.73

## 2024-04-27 LAB — LIPID PANEL
Chol/HDL Ratio: 4.1 ratio (ref 0.0–5.0)
Cholesterol, Total: 266 mg/dL — ABNORMAL HIGH (ref 100–199)
HDL: 65 mg/dL
LDL Chol Calc (NIH): 186 mg/dL — ABNORMAL HIGH (ref 0–99)
Triglycerides: 90 mg/dL (ref 0–149)
VLDL Cholesterol Cal: 15 mg/dL (ref 5–40)

## 2024-04-27 LAB — CBC
Hematocrit: 30 % — AB (ref 37.5–51.0)
Hemoglobin: 9.2 g/dL — AB (ref 13.0–17.7)
MCH: 28.3 pg (ref 26.6–33.0)
MCHC: 30.7 g/dL — AB (ref 31.5–35.7)
MCV: 92 fL (ref 79–97)
NRBC: 1 % — AB (ref 0–0)
Platelets: 216 x10E3/uL (ref 150–450)
RBC: 3.25 x10E6/uL — AB (ref 4.14–5.80)
RDW: 14.2 % (ref 11.6–15.4)
WBC: 3.3 x10E3/uL — AB (ref 3.4–10.8)

## 2024-04-29 ENCOUNTER — Other Ambulatory Visit: Payer: Self-pay | Admitting: Cardiology

## 2024-04-29 DIAGNOSIS — Z954 Presence of other heart-valve replacement: Secondary | ICD-10-CM

## 2024-04-30 NOTE — Telephone Encounter (Signed)
 Warfarin 5mg  Dx-S/P redo mitral valve replacement with metallic valve  Last INR Check-04/26/24 Last OV- 04/06/24

## 2024-05-04 ENCOUNTER — Emergency Department (HOSPITAL_COMMUNITY)
Admission: EM | Admit: 2024-05-04 | Discharge: 2024-05-04 | Disposition: A | Discharge status: Home / Self Care | Attending: Emergency Medicine | Admitting: Emergency Medicine

## 2024-05-04 DIAGNOSIS — Z7982 Long term (current) use of aspirin: Secondary | ICD-10-CM | POA: Diagnosis not present

## 2024-05-04 DIAGNOSIS — R040 Epistaxis: Secondary | ICD-10-CM | POA: Insufficient documentation

## 2024-05-04 DIAGNOSIS — D649 Anemia, unspecified: Secondary | ICD-10-CM | POA: Diagnosis not present

## 2024-05-04 DIAGNOSIS — Z7901 Long term (current) use of anticoagulants: Secondary | ICD-10-CM | POA: Insufficient documentation

## 2024-05-04 DIAGNOSIS — Z79899 Other long term (current) drug therapy: Secondary | ICD-10-CM | POA: Diagnosis not present

## 2024-05-04 LAB — CBC
HCT: 29.3 % — ABNORMAL LOW (ref 39.0–52.0)
Hemoglobin: 9.5 g/dL — ABNORMAL LOW (ref 13.0–17.0)
MCH: 28.8 pg (ref 26.0–34.0)
MCHC: 32.4 g/dL (ref 30.0–36.0)
MCV: 88.8 fL (ref 80.0–100.0)
Platelets: 102 K/uL — ABNORMAL LOW (ref 150–400)
RBC: 3.3 MIL/uL — ABNORMAL LOW (ref 4.22–5.81)
RDW: 15.4 % (ref 11.5–15.5)
WBC: 4 K/uL (ref 4.0–10.5)
nRBC: 1.2 % — ABNORMAL HIGH (ref 0.0–0.2)

## 2024-05-04 LAB — PROTIME-INR
INR: 2.6 — ABNORMAL HIGH (ref 0.8–1.2)
Prothrombin Time: 29.4 s — ABNORMAL HIGH (ref 11.4–15.2)

## 2024-05-04 MED ORDER — POLYETHYLENE GLYCOL 3350 17 G PO PACK: 17.0000 g | PACK | Freq: Every day | ORAL | 0 refills | Status: AC

## 2024-05-04 MED ORDER — SILVER NITRATE-POT NITRATE 75-25 % EX MISC
1.0000 | Freq: Once | CUTANEOUS | Status: AC
  Administered 2024-05-04: 1 via TOPICAL
  Filled 2024-05-04: qty 10

## 2024-05-04 MED ORDER — OXYMETAZOLINE HCL 0.05 % NA SOLN: 2.0000 | Freq: Two times a day (BID) | NASAL | 0 refills | Status: AC | PRN

## 2024-05-04 MED ORDER — FERROUS SULFATE 325 (65 FE) MG PO TABS: 325.0000 mg | ORAL_TABLET | Freq: Every day | ORAL | 0 refills | Status: DC

## 2024-05-04 NOTE — ED Provider Notes (Addendum)
 " David [name]   CSN: [id number] Arrival date & time: 05/04/24  1028     Patient presents with: Epistaxis   David [name] is a 64 y.o. male.   64 year old male history of mechanical valve on Coumadin  who presents emergency department with epistaxis.  Since December has been near daily episodes of epistaxis.  Points out of both naris.  Came to the emergency department on 12/17 but nosebleeding was able to be controlled.  Says that he had lab work that showed that he was anemic.  Says that he had another nosebleed this morning that resolved after pressure.  Wanted to come in to have his hemoglobin checked with the persistent bleeds.         Prior to Admission medications  Medication Sig Start Date End Date Taking? Authorizing Provider  ferrous sulfate  325 (65 FE) MG tablet Take 1 tablet (325 mg total) by mouth daily. 05/04/24  Yes Yolande Lamar BROCKS, MD  oxymetazoline  (AFRIN NASAL SPRAY) 0.05 % nasal spray Place 2 sprays into both nostrils 2 (two) times daily as needed for congestion. As needed for nosebleeds 05/04/24  Yes Yolande Lamar BROCKS, MD  polyethylene glycol (MIRALAX ) 17 g packet Take 17 g by mouth daily. 05/04/24  Yes Yolande Lamar BROCKS, MD  aspirin  EC 81 MG EC tablet Take 1 tablet (81 mg total) by mouth daily. 08/17/14   Barrett, Erin R, PA-C  baclofen  5 MG TABS Take 1 tablet (5 mg total) by mouth 3 (three) times daily. 04/16/24   Johnie Flaming A, NP  carvedilol  (COREG ) 3.125 MG tablet TAKE 1 TABLET BY MOUTH TWICE A DAY WITH FOOD 03/03/24   [name] Redell RAMAN, MD  chlorhexidine  (PERIDEX ) 0.12 % solution Use as directed 15 mLs in the mouth or throat 2 (two) times daily. 12/06/21   LampteyAleene KIDD, MD  co-enzyme Q-10 30 MG capsule Take 30 mg by mouth daily.    [provider]  lidocaine  (LIDODERM ) 5 % Place 1 patch onto the skin daily. Remove & Discard patch within 12 hours or as directed by MD 04/16/24   Johnie Flaming LABOR, NP  Multiple Minerals (JOINT HEALTH MINERAL PO) Take 1 tablet by mouth daily.    [provider]  Multiple Vitamin (MULTIVITAMIN) tablet Take 1 tablet by mouth daily.    [provider]  omega-3 acid ethyl esters (LOVAZA) 1 g capsule Take by mouth 2 (two) times daily.    [provider]  OVER THE COUNTER MEDICATION 1 tablet.    [provider]  ranitidine  (ZANTAC  75) 75 MG tablet Take 1 tablet (75 mg total) by mouth 2 (two) times daily. Patient not taking: Reported on 08/19/2022 03/13/18   Berneta Elsie Sayre, MD  rosuvastatin  (CRESTOR ) 20 MG tablet Take 1 tablet (20 mg total) by mouth daily. 08/22/22   [name] Redell RAMAN, MD  VITAMIN D  PO Take by mouth.    [provider]  warfarin (COUMADIN ) 5 MG tablet TAKE 1/2 TABLET TO 1 TABLET BY MOUTH DAILY OR AS DIRECTED BY ANTICOAGULATION CLINIC. 04/30/24   [name] Redell RAMAN, MD    Allergies: Clarithromycin    Review of Systems  Updated Vital Signs BP (!) 151/69 (BP Location: Left Arm)   Pulse 92   Temp 98.1 F (36.7 C) (Oral)   Resp 18   SpO2 98%   Physical Exam Constitutional:      Appearance: Normal appearance.  HENT:  Nose:     Comments: We have dried blood in left nare anteriorly.  No active bleeding.  No bleeding noted in right nare.    Mouth/Throat:     Mouth: Mucous membranes are moist.     Pharynx: Oropharynx is clear.  Eyes:     Extraocular Movements: Extraocular movements intact.     Conjunctiva/sclera: Conjunctivae normal.     Pupils: Pupils are equal, round, and reactive to light.  Pulmonary:     Effort: Pulmonary effort is normal. No respiratory distress.     Breath sounds: Normal breath sounds.  Neurological:     Mental Status: He is alert.     (all labs ordered are listed, but only abnormal results are displayed) Labs Reviewed  CBC - Abnormal; Notable for the following components:      Result Value   RBC 3.30 (*)    Hemoglobin 9.5 (*)    HCT 29.3 (*)     Platelets 102 (*)    nRBC 1.2 (*)    All other components within normal limits  PROTIME-INR - Abnormal; Notable for the following components:   Prothrombin Time 29.4 (*)    INR 2.6 (*)    All other components within normal limits    EKG: None  Radiology: No results found.   Epistaxis Management  Date/Time: 05/04/2024 1:30 PM  Performed by: Yolande Lamar BROCKS, MD Authorized by: Yolande Lamar BROCKS, MD   Consent:    Consent obtained:  Verbal   Consent given by:  Patient   Risks, benefits, and alternatives were discussed: yes     Risks discussed:  Bleeding   Alternatives discussed:  No treatment Universal protocol:    Patient identity confirmed:  Verbally with patient Procedure details:    Treatment site:  L anterior   Treatment method:  Silver  nitrate   Treatment complexity:  Limited Post-procedure details:    Assessment:  Bleeding stopped   Procedure completion:  Tolerated well, no immediate complications    Medications Ordered in the ED  silver  nitrate applicators applicator 1 Application (1 Application Topical Given 05/04/24 1246)                                    Medical Decision Making Risk OTC drugs. Prescription drug management.   David [name] is a 64 year old male history of mechanical valve on Coumadin  who presents emergency department epistaxis  Initial Ddx:  Anterior nosebleed, posterior nosebleed, anemia, supratherapeutic INR  MDM/Course:  Patient presents emergency department with nosebleed.  Has been having this intermittent almost daily in the morning since mid December.  On exam is not in acute distress.  Does have an area that appears friable with some dried blood on it that I suspect was causing his nosebleed today.  No active bleeding.  Silver  nitrate cautery was performed to that area.  INR today is 2.6.  Hemoglobin is 9.5 which is stable from several days ago.  Upon re-evaluation patient stable.  Prescribed him Afrin and instructed him on  how to apply pressure to his nose should start to bleed again.  Will have him follow-up with ENT as well in several days. Will also start him on iron since I suspect there is a component of IDA possible from his nosebleeds  This patient presents to the ED for concern of complaints listed in HPI, this involves an extensive number of treatment options, and is a  complaint that carries with it a high risk of complications and morbidity. Disposition including potential need for admission considered.   Dispo: DC Home. Return precautions discussed including, but not limited to, those listed in the AVS. Allowed pt time to ask questions which were answered fully prior to dc.  I have reviewed the patients home medications and made adjustments as needed Additional history obtained from spouse Records reviewed ED Visit Notes The following labs were independently interpreted: CBC and show anemia  Portions of this [name] were generated with Dragon dictation software. Dictation errors may occur despite best attempts at proofreading.     Final diagnoses:  Epistaxis  Anemia, unspecified type    ED Discharge Orders          Ordered    ferrous sulfate  325 (65 FE) MG tablet  Daily        05/04/24 1301    polyethylene glycol (MIRALAX ) 17 g packet  Daily        05/04/24 1301    oxymetazoline  (AFRIN NASAL SPRAY) 0.05 % nasal spray  2 times daily PRN        05/04/24 1301               Yolande Lamar BROCKS, MD 05/04/24 1554    Yolande Lamar BROCKS, MD 05/04/24 1555  "

## 2024-05-04 NOTE — ED Triage Notes (Signed)
 Here for repeated nose bleeds. Previously had labs drawn for these nosebleeds and his hgb was between 9-10. Is here for blood work. Not currently having a nosebleed.

## 2024-05-04 NOTE — Discharge Instructions (Addendum)
 You were seen for your nosebleed in the emergency department.   If you experience another nosebleed please blow your nose, use the Afrin (3 sprays in your nostril), and hold constant pressure for 15 minutes.  Do not stop to check if the bleeding is stopped or for any other reason.    Check your MyChart online for the results of any tests that had not resulted by the time you left the emergency department.   Follow-up with your primary doctor in 2-3 days regarding your visit.  Follow-up with the ENT doctors about your nosebleeds  Return immediately to the emergency department if you experience any of the following: Recurrent nosebleeding that does not respond to pressure for 15 minutes and Afrin, difficulty breathing, or any other concerning symptoms.    Thank you for visiting our Emergency Department. It was a pleasure taking care of you today.

## 2024-05-07 ENCOUNTER — Other Ambulatory Visit: Payer: Self-pay

## 2024-05-07 ENCOUNTER — Emergency Department (HOSPITAL_COMMUNITY)
Admission: EM | Admit: 2024-05-07 | Discharge: 2024-05-07 | Disposition: A | Discharge status: Home / Self Care | Attending: Emergency Medicine | Admitting: Emergency Medicine

## 2024-05-07 DIAGNOSIS — Z7982 Long term (current) use of aspirin: Secondary | ICD-10-CM | POA: Diagnosis not present

## 2024-05-07 DIAGNOSIS — R040 Epistaxis: Secondary | ICD-10-CM | POA: Diagnosis present

## 2024-05-07 DIAGNOSIS — Z7901 Long term (current) use of anticoagulants: Secondary | ICD-10-CM | POA: Diagnosis not present

## 2024-05-07 MED ORDER — SILVER NITRATE-POT NITRATE 75-25 % EX MISC
1.0000 | Freq: Once | CUTANEOUS | Status: AC
  Administered 2024-05-07: 1 via TOPICAL

## 2024-05-07 MED ORDER — OXYMETAZOLINE HCL 0.05 % NA SOLN
1.0000 | Freq: Once | NASAL | Status: AC
  Administered 2024-05-07: 1 via NASAL
  Filled 2024-05-07: qty 30

## 2024-05-07 MED ORDER — LIDOCAINE-EPINEPHRINE-TETRACAINE (LET) TOPICAL GEL
3.0000 mL | Freq: Once | TOPICAL | Status: DC
  Filled 2024-05-07: qty 3

## 2024-05-07 NOTE — ED Provider Notes (Signed)
 " [place] EMERGENCY DEPARTMENT AT [facility] Provider Note   CSN: [id number] Arrival date & time: 05/07/24  9246     Patient presents with: Epistaxis   David [name] is a 64 y.o. male with PMHx mechanical valve on Coumadin  who presents to ED concerned for epistaxis. Patient with near daily episodes of epistaxis since December. Patient was seen in ED 3 days ago and was placed on Afrin which was helping for a couple of days up until last night at [facility] when the nose bleeding started again. Afrin no longer helping. Patient denies any other symptoms today. Patient has not established an appointment with ENT yet.     Epistaxis      Prior to Admission medications  Medication Sig Start Date End Date Taking? Authorizing Provider  aspirin  EC 81 MG EC tablet Take 1 tablet (81 mg total) by mouth daily. 08/17/14   Barrett, Erin R, PA-C  baclofen  5 MG TABS Take 1 tablet (5 mg total) by mouth 3 (three) times daily. 04/16/24   Johnie Flaming A, NP  carvedilol  (COREG ) 3.125 MG tablet TAKE 1 TABLET BY MOUTH TWICE A DAY WITH FOOD 03/03/24   [name] Redell RAMAN, MD  chlorhexidine  (PERIDEX ) 0.12 % solution Use as directed 15 mLs in the mouth or throat 2 (two) times daily. 12/06/21   LampteyAleene KIDD, MD  co-enzyme Q-10 30 MG capsule Take 30 mg by mouth daily.    [provider]  ferrous sulfate  325 (65 FE) MG tablet Take 1 tablet (325 mg total) by mouth daily. 05/04/24   Yolande Lamar BROCKS, MD  lidocaine  (LIDODERM ) 5 % Place 1 patch onto the skin daily. Remove & Discard patch within 12 hours or as directed by MD 04/16/24   Johnie Flaming LABOR, NP  Multiple Minerals (JOINT HEALTH MINERAL PO) Take 1 tablet by mouth daily.    [provider]  Multiple Vitamin (MULTIVITAMIN) tablet Take 1 tablet by mouth daily.    [provider]  omega-3 acid ethyl esters (LOVAZA) 1 g capsule Take by mouth 2 (two) times daily.    [provider]  OVER THE COUNTER MEDICATION 1  tablet.    [provider]  oxymetazoline  (AFRIN NASAL SPRAY) 0.05 % nasal spray Place 2 sprays into both nostrils 2 (two) times daily as needed for congestion. As needed for nosebleeds 05/04/24   Yolande Lamar BROCKS, MD  polyethylene glycol (MIRALAX ) 17 g packet Take 17 g by mouth daily. 05/04/24   Yolande Lamar BROCKS, MD  ranitidine  (ZANTAC  75) 75 MG tablet Take 1 tablet (75 mg total) by mouth 2 (two) times daily. Patient not taking: Reported on 08/19/2022 03/13/18   Berneta Elsie Sayre, MD  rosuvastatin  (CRESTOR ) 20 MG tablet Take 1 tablet (20 mg total) by mouth daily. 08/22/22   [name] Redell RAMAN, MD  VITAMIN D  PO Take by mouth.    [provider]  warfarin (COUMADIN ) 5 MG tablet TAKE 1/2 TABLET TO 1 TABLET BY MOUTH DAILY OR AS DIRECTED BY ANTICOAGULATION CLINIC. 04/30/24   [name] Redell RAMAN, MD    Allergies: Clarithromycin    Review of Systems  HENT:  Positive for nosebleeds.     Updated Vital Signs BP 132/64 (BP Location: Right Arm)   Pulse 71   Temp (!) 97.5 F (36.4 C) (Oral)   Resp 19   Ht 5' 10 (1.778 m)   Wt 70.3 kg   SpO2 100%   BMI 22.24 kg/m   Physical  Exam Vitals and nursing note reviewed.  Constitutional:      General: He is not in acute distress.    Appearance: He is not ill-appearing or toxic-appearing.  HENT:     Head: Normocephalic and atraumatic.     Nose:     Comments: No postnasal drip. Beefy red mucosa on BL anterior areas. No obvious hemorrhage or pulsatile bleeding.  Eyes:     General: No scleral icterus.       Right eye: No discharge.        Left eye: No discharge.     Conjunctiva/sclera: Conjunctivae normal.  Cardiovascular:     Rate and Rhythm: Normal rate.  Pulmonary:     Effort: Pulmonary effort is normal.  Abdominal:     General: Abdomen is flat.  Skin:    General: Skin is warm and dry.  Neurological:     General: No focal deficit present.     Mental Status: He is alert. Mental status is at baseline.  Psychiatric:         Mood and Affect: Mood normal.        Behavior: Behavior normal.     (all labs ordered are listed, but only abnormal results are displayed) Labs Reviewed - No data to display  EKG: None  Radiology: No results found.   Procedures   Medications Ordered in the ED  lidocaine -EPINEPHrine -tetracaine  (LET) topical gel (3 mLs Topical Not Given 05/07/24 0859)  oxymetazoline  (AFRIN) 0.05 % nasal spray 1 spray (1 spray Each Nare Given 05/07/24 0840)  silver  nitrate applicators applicator 1 Application (1 Application Topical Given by Other 05/07/24 0946)                                    Medical Decision Making Risk OTC drugs. Prescription drug management.   This patient presents to the ED for concern of epistaxis, this involves an extensive number of treatment options, and is a complaint that carries with it a high risk of complications and morbidity.  The differential diagnosis includes nose bleed, trauma, etc.   Co morbidities that complicate the patient evaluation  mechanical valve on Coumadin     Additional history obtained:  Additional history obtained from 1/6 ED note 05/04/2024 CBC: Hemoglobin 9.5 which is uptrending from 9.2 11 days prior.    Problem List / ED Course / Critical interventions / Medication management  Presents to ED concerned for epistaxis.  Patient with nearly daily episodes of epistaxis over the past 1 month.  Patient with recent workup in ED for this.  Physical exam was reassuring.  Patient afebrile with stable vitals. Very small amount of bleeding appears to be coming from anterior surface.  Pressure was held for after Afrin which did not resolve bleeding. LET was applied to gauze and placed in nose which did appear to help the bleeding. Silver  Nitrate sticks applied to areas of slow bleeding. Patient monitored in ED without recurrence of nose bleeding. Patient educated on following up with ENT. Patient ready for discharge. I requested consultation  with the ENT provider on-call to help establish an appointment for the patient. Dr. Llewellyn educated me that patient only needs to call the office and they will get him an appointment. Patient agreeable to plan.  I have reviewed the patients home medicines and have made adjustments as needed The patient has been appropriately medically screened and/or stabilized in the ED. I have  low suspicion for any other emergent medical condition which would require further screening, evaluation or treatment in the ED or require inpatient management. At time of discharge the patient is hemodynamically stable and in no acute distress. I have discussed work-up results and diagnosis with patient and answered all questions. Patient is agreeable with discharge plan. We discussed strict return precautions for returning to the emergency department and they verbalized understanding.     Social Determinants of Health:  none      Final diagnoses:  Epistaxis    ED Discharge Orders     None          Hoy Nidia FALCON, [place] 05/07/24 1018    Ellouise Richerd POUR, [place] 05/07/24 1218  "

## 2024-05-07 NOTE — ED Triage Notes (Signed)
 X 0330 am, reports recent hx of the same, pt on warfarin since 2016 for artifical valve. Denies any other complaints, bleeding minimal currently

## 2024-05-07 NOTE — Discharge Instructions (Addendum)
 Please follow up with [facility] ENT. Seek emergency care if experiencing any new or worsening symptoms

## 2024-05-10 ENCOUNTER — Ambulatory Visit: Attending: Cardiology

## 2024-05-10 ENCOUNTER — Encounter (HOSPITAL_BASED_OUTPATIENT_CLINIC_OR_DEPARTMENT_OTHER): Payer: Self-pay

## 2024-05-10 ENCOUNTER — Ambulatory Visit (INDEPENDENT_AMBULATORY_CARE_PROVIDER_SITE_OTHER)

## 2024-05-10 VITALS — BP 144/77 | HR 78 | Ht 70.5 in | Wt 151.8 lb

## 2024-05-10 DIAGNOSIS — I349 Nonrheumatic mitral valve disorder, unspecified: Secondary | ICD-10-CM | POA: Diagnosis not present

## 2024-05-10 DIAGNOSIS — Z954 Presence of other heart-valve replacement: Secondary | ICD-10-CM

## 2024-05-10 DIAGNOSIS — D1779 Benign lipomatous neoplasm of other sites: Secondary | ICD-10-CM | POA: Diagnosis not present

## 2024-05-10 DIAGNOSIS — I059 Rheumatic mitral valve disease, unspecified: Secondary | ICD-10-CM

## 2024-05-10 DIAGNOSIS — Z7901 Long term (current) use of anticoagulants: Secondary | ICD-10-CM

## 2024-05-10 DIAGNOSIS — Z7689 Persons encountering health services in other specified circumstances: Secondary | ICD-10-CM

## 2024-05-10 LAB — POCT INR: INR: 5.1 — AB (ref 2.0–3.0)

## 2024-05-10 NOTE — Progress Notes (Signed)
 "   New Patient Office Visit  Subjective:   David [name] [birth year] 05/10/2024  Chief Complaint  Patient presents with   New Patient (Initial Visit)    Patient is here to establish care with PCP. States he's been having frequent nose bleeds and has an elevated INR. Patient also states he has a knot on his back.    HPI: David [name] presents today to establish care at [facility] at [facility]. Introduced to publishing rights manager role and practice setting with verbalized understanding by patient.  All questions answered.   Last PCP: years Last annual physical: years Concerns: See below   Lipoma; States he had a place on his back for a while in which he went to [facility] and had it drained. States he has come back and gotten bigger. Does not think they got the core out.    Recent nose bleeds; States he recently has had more nosebleeds than normal. States he also has noticed his warfarin levels have not been as controlled as they usually are. States his level was high at his appt today. States he has not had any nose bleeds the past few days and has felt good overall.   The following portions of the patient's history were reviewed and updated as appropriate: past medical history, past surgical history, family history, social history, allergies, medications, and problem list.   Patient Active Problem List   Diagnosis Date Noted   Epidermal inclusion cyst 03/11/2018   Neutropenia 08/26/2017   Healthcare maintenance 03/08/2016   Strain of lumbar paraspinal muscle 08/01/2015   Left knee pain 08/01/2015   Long term current use of anticoagulant therapy 08/19/2014   S/P redo mitral valve replacement with metallic valve 08/11/2014   Mitral valve regurgitation 08/09/2014   Severe mitral regurgitation 08/03/2014   Hx of mitral valve repair 08/03/2014   Mitral regurgitation 07/19/2014   Hemolytic anemia 06/29/2014   Cystitis 06/23/2014   Malignant  neoplasm of prostate (HCC) 06/23/2014   Nodule of right lung 06/23/2014   Symptomatic anemia 06/21/2014   Prostate cancer (HCC) 06/07/2014   Microcytic anemia    Hyperlipidemia 05/31/2014   Nonspecific abnormal electrocardiogram (ECG) (EKG) 11/19/2011   HLD (hyperlipidemia) 01/12/2010   GERD 01/12/2010   HIATAL HERNIA 05/12/2009   Mitral valve disorder 06/29/2008   Past Medical History:  Diagnosis Date   DDD (degenerative disc disease), lumbosacral    Hemolytic anemia 06/29/2014   History of bacterial endocarditis    07/ 2001  enterococcal//  recurrence  11/ 2009  steptococcus viridans   History of chest tube placement    1980's --- R SIDE  SPONTANEOUS  PNEUMOTHORAX   History of Clostridium difficile colitis    2009   History of periodontal disease    Prostate cancer [facility]) urologist--  dr herrick/  oncologist-- dr patrcia   Stage  T1c,  Gleason 3+4,  PSA 3.76,  vol 33.45cc   Pulmonary nodules followed by dr pietro   right upper & lower lobe and left lower lobe--  stable per CT 08-29-2014   S/P redo mitral valve replacement with metallic valve cardiologist--  dr pietro   08-11-2014--- 31 mm Sorin [employer] Optiform bileaflet mechanical prosthesis   Thoracic radiculopathy    T7  (positional)  per PCP note--  pt doing yoga and swimming state has helpted   Wears glasses    Wears partial dentures    upper   Past Surgical History:  Procedure Laterality Date  CARDIAC CATHETERIZATION  03-15-2008  dr morris   no sig. coronary disease/  normal LVF/  moderate pulmonary hypertension w/ elevated V wave   COLONOSCOPY  last one 12/ 2013   ESOPHAGOGASTRODUODENOSCOPY N/A 06/02/2014   Procedure: ESOPHAGOGASTRODUODENOSCOPY (EGD);  Surgeon: Gordy CHRISTELLA Starch, MD;  Location: [facility] ENDOSCOPY;  Service: Endoscopy;  Laterality: N/A;   LEFT AND RIGHT HEART CATHETERIZATION WITH CORONARY ANGIOGRAM N/A 08/10/2014   Procedure: LEFT AND RIGHT HEART CATHETERIZATION WITH CORONARY ANGIOGRAM;  Surgeon: Victory LELON Sharps, MD;  Location: [facility] CATH LAB;  Service: Cardiovascular;  Laterality: N/A;   EF 55%,  severe MR, severe pulm HTN,  40% LAD   MITRAL VALVE REPAIR  03-22-2008   via  right mini thoracotomy   MITRAL VALVE REPAIR N/A 08/11/2014   Procedure: REDO MITRAL VALVE (MV) REPLACEMENT;  Surgeon: Sudie VEAR Laine, MD;  Location: [facility] OR;  Service: Open Heart Surgery;  Laterality: N/A;  NO NECK LINES ON RIGHT SIDE   MULTIPLE TOOTH EXTRACTIONS  03-16-2008    w/ alveoloplasty   PROSTATE BIOPSY  03/2014   RADIOACTIVE SEED IMPLANT N/A 03/17/2015   Procedure: RADIOACTIVE SEED IMPLANT/BRACHYTHERAPY IMPLANT;  Surgeon: Morene LELON Salines, MD;  Location: [facility];  Service: Urology;  Laterality: N/A;  DR PORTABLE   TEE WITHOUT CARDIOVERSION N/A 07/19/2014   Procedure: TRANSESOPHAGEAL ECHOCARDIOGRAM (TEE);  Surgeon: Redell GORMAN Shallow, MD;  Location: [facility] ENDOSCOPY;  Service: Cardiovascular;  Laterality: N/A;   TEE WITHOUT CARDIOVERSION N/A 08/11/2014   Procedure: TRANSESOPHAGEAL ECHOCARDIOGRAM (TEE);  Surgeon: Sudie VEAR Laine, MD;  Location: [facility] OR;  Service: Open Heart Surgery;  Laterality: N/A;   TRANSTHORACIC ECHOCARDIOGRAM  09-23-2014   dr  shallow   mild focal basal LVH of the septum,  ef 45-50%,  normal wall motion/  mechanical bileaflet MV sits well in mitrial position, no sig. 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    Employer: [employer]    Comment: for optician, 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  Tobacco Use   Smoking status: Former    Current packs/day: 0.00    Average packs/day: 2.0 packs/day for 15.0 years (30.0 ttl pk-yrs)    Types: Cigarettes    Start date: 08/09/1974    Quit date: 08/08/1989    Years since quitting: 34.7   Smokeless tobacco: Never  Vaping Use   Vaping status: Never Used  Substance and Sexual Activity   Alcohol use: No   Drug use: No    Types: Cocaine, Heroin, Marijuana    Comment: stopped using drugs in ~ 1984   Sexual activity: Not on file  Other Topics Concern   Not on file  Social History Narrative   REGULAR EXERCISE 2X WK TREADMILL,WEIGHTS   Social Drivers of Health   Tobacco Use: Medium Risk (05/10/2024)   Patient History  Smoking Tobacco Use: Former    Smokeless Tobacco Use: Never    Passive Exposure: Not on file  Financial Resource Strain: Low Risk (05/10/2024)   Overall Financial Resource Strain (CARDIA)    Difficulty of Paying Living Expenses: Not hard at all  Food Insecurity: No Food Insecurity (05/10/2024)   Epic    Worried About Programme Researcher, Broadcasting/film/video in the Last Year: Never true    Ran Out of Food in the Last Year: Never true  Transportation Needs: No Transportation Needs (05/10/2024)   Epic    Lack of Transportation (Medical): No    Lack of Transportation (Non-Medical): No  Physical Activity: Inactive (05/10/2024)   Exercise Vital Sign    Days of Exercise per Week: 0 days    Minutes of Exercise per Session: 0 min  Stress: No Stress Concern Present (05/10/2024)   [employer] of Occupational Health - Occupational Stress Questionnaire    Feeling of Stress: Not at all  Social Connections: Socially Integrated (05/10/2024)   Social Connection and Isolation Panel    Frequency of Communication with Friends and Family: More than three times a week    Frequency of Social Gatherings with Friends and Family: Once a week    Attends Religious Services: More than 4 times per year     Active Member of Clubs or Organizations: No    Attends Engineer, Structural: More than 4 times per year    Marital Status: Married  Catering Manager Violence: Not At Risk (05/10/2024)   Epic    Fear of Current or Ex-Partner: No    Emotionally Abused: No    Physically Abused: No    Sexually Abused: No  Depression (PHQ2-9): Low Risk (05/10/2024)   Depression (PHQ2-9)    PHQ-2 Score: 0  Alcohol Screen: Low Risk (05/10/2024)   Alcohol Screen    Last Alcohol Screening Score (AUDIT): 0  Housing: Low Risk (05/10/2024)   Epic    Unable to Pay for Housing in the Last Year: No    Number of Times Moved in the Last Year: 0    Homeless in the Last Year: No  Utilities: Not At Risk (05/10/2024)   Epic    Threatened with loss of utilities: No  Health Literacy: Adequate Health Literacy (05/10/2024)   B1300 Health Literacy    Frequency of need for help with medical instructions: Never   Outpatient Medications Prior to Visit  Medication Sig Dispense Refill   aspirin  EC 81 MG EC tablet Take 1 tablet (81 mg total) by mouth daily.     carvedilol  (COREG ) 3.125 MG tablet TAKE 1 TABLET BY MOUTH TWICE A DAY WITH FOOD 60 tablet 0   Multiple Vitamin (MULTIVITAMIN) tablet Take 1 tablet by mouth daily.     oxymetazoline  (AFRIN NASAL SPRAY) 0.05 % nasal spray Place 2 sprays into both nostrils 2 (two) times daily as needed for congestion. As needed for nosebleeds 30 mL 0   polyethylene glycol (MIRALAX ) 17 g packet Take 17 g by mouth daily. 14 each 0   VITAMIN D  PO Take by mouth.     warfarin (COUMADIN ) 5 MG tablet TAKE 1/2 TABLET TO 1 TABLET BY MOUTH DAILY OR AS DIRECTED BY ANTICOAGULATION CLINIC. 90 tablet 1   ferrous sulfate  325 (65 FE) MG tablet Take 1 tablet (325 mg total) by mouth daily. (Patient taking differently: Take 325 mg by mouth every 3 (three) days.) 30 tablet 0   baclofen  5 MG TABS Take 1 tablet (5  mg total) by mouth 3 (three) times daily. (Patient not taking: Reported on 05/10/2024) 30 tablet  0   chlorhexidine  (PERIDEX ) 0.12 % solution Use as directed 15 mLs in the mouth or throat 2 (two) times daily. (Patient not taking: Reported on 05/10/2024) 120 mL 0   co-enzyme Q-10 30 MG capsule Take 30 mg by mouth daily. (Patient not taking: Reported on 05/10/2024)     lidocaine  (LIDODERM ) 5 % Place 1 patch onto the skin daily. Remove & Discard patch within 12 hours or as directed by MD (Patient not taking: Reported on 05/10/2024) 30 patch 0   Multiple Minerals (JOINT HEALTH MINERAL PO) Take 1 tablet by mouth daily. (Patient not taking: Reported on 05/10/2024)     omega-3 acid ethyl esters (LOVAZA) 1 g capsule Take by mouth 2 (two) times daily. (Patient not taking: Reported on 05/10/2024)     OVER THE COUNTER MEDICATION 1 tablet. (Patient not taking: Reported on 05/10/2024)     ranitidine  (ZANTAC  75) 75 MG tablet Take 1 tablet (75 mg total) by mouth 2 (two) times daily. (Patient not taking: Reported on 08/19/2022) 90 tablet 3   rosuvastatin  (CRESTOR ) 20 MG tablet Take 1 tablet (20 mg total) by mouth daily. (Patient not taking: Reported on 05/10/2024) 90 tablet 3   No facility-administered medications prior to visit.   Allergies[1]  ROS: A complete ROS was performed with pertinent positives/negatives noted in the HPI. The remainder of the ROS are negative.   Objective:   Today's Vitals   05/10/24 1427  BP: (!) 144/77  Pulse: 78  SpO2: 100%  Weight: 151 lb 12.8 oz (68.9 kg)  Height: 5' 10.5 (1.791 m)    GENERAL: Well-appearing, in NAD. Well nourished. SKIN: lipoma/cyst noted on upper middle back measuring 8cm in length and 6cm wide RESPIRATORY: Chest wall symmetrical. Respirations even and non-labored. Breath sounds clear to auscultation bilaterally.  CARDIAC: S1, S2 present, regular rate and rhythm without murmur or gallops. Peripheral pulses 2+ bilaterally.  MSK: Muscle tone and strength appropriate for age. Joints w/o tenderness, redness, or swelling.  EXTREMITIES: Without clubbing,  cyanosis, or edema.  NEUROLOGIC: No motor or sensory deficits. Steady, even gait. C2-C12 intact.  PSYCH/MENTAL STATUS: Alert, oriented x 3. Cooperative, appropriate mood and affect.    Health Maintenance Due  Topic Date Due   Pneumococcal Vaccine: 50+ Years (2 of 2 - PCV) 07/30/2005   DTaP/Tdap/Td (2 - Tdap) 01/13/2020   COVID-19 Vaccine (3 - Pfizer risk series) 05/22/2021   Colonoscopy  04/13/2022   Influenza Vaccine  11/28/2023    Results for orders placed or performed in visit on 05/10/24  POCT INR  Result Value Ref Range   INR 5.1 (A) 2.0 - 3.0   POC INR         Assessment & Plan:  1. Encounter to establish care with new doctor (Primary) Discussed role of NP and expectations of the Primary Care Clinic. Discussed medical, surgical, and family history.   2. Mitral valve disorder Made sure patient understood instructions from warfarin clinic given to him at his visit today. Pt states he understood correct changes in dosage and is following up with them next week.   3. Lipoma of other specified sites Referral placed as the area will most likely need extraction of the cystic wall as well as drainage with potential packing.  - Ambulatory referral to Dermatology  Patient to reach out to office if new, worrisome, or unresolved symptoms arise or if no improvement in  patient's condition. Patient verbalized understanding and is agreeable to treatment plan. All questions answered to patient's satisfaction.    Return in about 3 months (around 08/08/2024) for annual physical (fasting labs day of).    Lauraine Almarie Angus DNP, FNP-C     [1]  Allergies Allergen Reactions   Clarithromycin Nausea Only   "

## 2024-05-10 NOTE — Progress Notes (Signed)
"   INR 5.1 Please see anticoagulation encounter Do not take any warfarin today or tomorrow then Decrease to 1 tablet daily except 1/2 tablet on Mondays, Wednesdays and Fridays.  Repeat INR in 1 week (normally 6 weeks).  Coumadin  Clinic [phone] "

## 2024-05-10 NOTE — Patient Instructions (Signed)
 Do not take any warfarin today or tomorrow then Decrease to 1 tablet daily except 1/2 tablet on Mondays, Wednesdays and Fridays.  Repeat INR in 1 week (normally 6 weeks).  Coumadin  Clinic [phone]

## 2024-05-11 ENCOUNTER — Other Ambulatory Visit: Payer: Self-pay

## 2024-05-11 ENCOUNTER — Emergency Department (HOSPITAL_COMMUNITY)
Admission: EM | Admit: 2024-05-11 | Discharge: 2024-05-11 | Disposition: A | Discharge status: Home / Self Care | Attending: Emergency Medicine | Admitting: Emergency Medicine

## 2024-05-11 ENCOUNTER — Encounter (HOSPITAL_COMMUNITY): Payer: Self-pay | Admitting: Emergency Medicine

## 2024-05-11 DIAGNOSIS — Z79899 Other long term (current) drug therapy: Secondary | ICD-10-CM | POA: Insufficient documentation

## 2024-05-11 DIAGNOSIS — R040 Epistaxis: Secondary | ICD-10-CM | POA: Insufficient documentation

## 2024-05-11 DIAGNOSIS — D62 Acute posthemorrhagic anemia: Secondary | ICD-10-CM | POA: Diagnosis not present

## 2024-05-11 DIAGNOSIS — Z7982 Long term (current) use of aspirin: Secondary | ICD-10-CM | POA: Insufficient documentation

## 2024-05-11 DIAGNOSIS — R791 Abnormal coagulation profile: Secondary | ICD-10-CM | POA: Diagnosis not present

## 2024-05-11 LAB — ABO/RH: ABO/RH(D): O POS

## 2024-05-11 LAB — CBC
HCT: 24.9 % — ABNORMAL LOW (ref 39.0–52.0)
Hemoglobin: 7.7 g/dL — ABNORMAL LOW (ref 13.0–17.0)
MCH: 28.5 pg (ref 26.0–34.0)
MCHC: 30.9 g/dL (ref 30.0–36.0)
MCV: 92.2 fL (ref 80.0–100.0)
Platelets: 86 K/uL — ABNORMAL LOW (ref 150–400)
RBC: 2.7 MIL/uL — ABNORMAL LOW (ref 4.22–5.81)
RDW: 15.9 % — ABNORMAL HIGH (ref 11.5–15.5)
WBC: 4 K/uL (ref 4.0–10.5)
nRBC: 5 % — ABNORMAL HIGH (ref 0.0–0.2)

## 2024-05-11 LAB — PREPARE RBC (CROSSMATCH)

## 2024-05-11 LAB — BASIC METABOLIC PANEL WITH GFR
Anion gap: 10 (ref 5–15)
BUN: 14 mg/dL (ref 8–23)
CO2: 25 mmol/L (ref 22–32)
Calcium: 9.8 mg/dL (ref 8.9–10.3)
Chloride: 101 mmol/L (ref 98–111)
Creatinine, Ser: 0.78 mg/dL (ref 0.61–1.24)
GFR, Estimated: >60 mL/min
Glucose, Bld: 107 mg/dL — ABNORMAL HIGH (ref 70–99)
Potassium: 3.9 mmol/L (ref 3.5–5.1)
Sodium: 137 mmol/L (ref 135–145)

## 2024-05-11 LAB — PROTIME-INR
INR: 7.2 (ref 0.8–1.2)
Prothrombin Time: 64.8 s — ABNORMAL HIGH (ref 11.4–15.2)

## 2024-05-11 MED ORDER — OXYMETAZOLINE HCL 0.05 % NA SOLN
1.0000 | Freq: Once | NASAL | Status: AC
  Administered 2024-05-11: 1 via NASAL
  Filled 2024-05-11: qty 30

## 2024-05-11 MED ORDER — VITAMIN K1 10 MG/ML IJ SOLN: 1.0000 mg | Freq: Once | INTRAVENOUS | Status: DC

## 2024-05-11 MED ORDER — SODIUM CHLORIDE 0.9% IV SOLUTION: Freq: Once | INTRAVENOUS | Status: AC

## 2024-05-11 NOTE — ED Provider Notes (Signed)
 L side nosebleed after cauterization.  INR is 7.2 and a drop in hemoglobin  7.7 from 9.5. receiving 1 unit currently.    David [name] have an appointment for Feb 3rd with his ENT at [facility],  [place].  Call office again for closer follow up.   David [name] not actively bleeding. Has mechanical valve.   I was able to reach out to ENT office and schedule a close follow-up appointment for patient.  Patient will follow-up with ENT in 2 days at 830 with Dr. Para.  Patient voiced understanding and agrees with plan.  Patient will be discharged once he finished with his blood transfusion.  Currently no active nosebleed.  BP 124/68   Pulse 73   Temp 98.1 F (36.7 C) (Oral)   Resp 18   SpO2 100%   Results for orders placed or performed during the hospital encounter of 05/11/24  CBC   Collection Time: 05/11/24  1:22 AM  Result Value Ref Range   WBC 4.0 4.0 - 10.5 K/uL   RBC 2.70 (L) 4.22 - 5.81 MIL/uL   Hemoglobin 7.7 (L) 13.0 - 17.0 g/dL   HCT 75.0 (L) 60.9 - 47.9 %   MCV 92.2 80.0 - 100.0 fL   MCH 28.5 26.0 - 34.0 pg   MCHC 30.9 30.0 - 36.0 g/dL   RDW 84.0 (H) 88.4 - 84.4 %   Platelets 86 (L) 150 - 400 K/uL   nRBC 5.0 (H) 0.0 - 0.2 %  Protime-INR - (Do not order if patient is on a DOAC such as Xarelto, Eliquis etc.)   Collection Time: 05/11/24  1:22 AM  Result Value Ref Range   Prothrombin Time 64.8 (H) 11.4 - 15.2 seconds   INR 7.2 (HH) 0.8 - 1.2  Type and screen [facility]   Collection Time: 05/11/24  5:35 AM  Result Value Ref Range   ABO/RH(D) O POS    Antibody Screen NEG    Sample Expiration 05/14/2024,2359    Unit Number [id number]    Blood Component Type RED CELLS,LR    Unit division 00    Status of Unit ISSUED    Transfusion Status OK TO TRANSFUSE    Crossmatch Result      Compatible Performed at [facility], [address] [place], [place] [address]   Prepare RBC (crossmatch)   Collection Time: 05/11/24  5:35 AM  Result Value Ref  Range   Order Confirmation      ORDER PROCESSED BY BLOOD BANK Performed at [facility], [address], [place], [place] [address]   BPAM [facility]   Collection Time: 05/11/24  5:35 AM  Result Value Ref Range   ISSUE DATE / TIME [id number]    Blood Product Unit Number [id number]    PRODUCT CODE E0382V00    Unit Type and Rh 5100    Blood Product Expiration Date 797397867640   Basic metabolic panel   Collection Time: 05/11/24  5:38 AM  Result Value Ref Range   Sodium 137 135 - 145 mmol/L   Potassium 3.9 3.5 - 5.1 mmol/L   Chloride 101 98 - 111 mmol/L   CO2 25 22 - 32 mmol/L   Glucose, Bld 107 (H) 70 - 99 mg/dL   BUN 14 8 - 23 mg/dL   Creatinine, Ser 9.21 0.61 - 1.24 mg/dL   Calcium  9.8 8.9 - 10.3 mg/dL   GFR, Estimated >39 >39 mL/min   Anion gap 10 5 - 15  ABO/Rh  Collection Time: 05/11/24  5:38 AM  Result Value Ref Range   ABO/RH(D)      O POS Performed at [facility], [address], [place], [place] [address]    No results found.    Nivia Colon, PA-C 05/11/24 1056    Kammerer, Megan L, DO 05/12/24 [phone]

## 2024-05-11 NOTE — Discharge Instructions (Signed)
 Please withheld your  coumadin  and follow up with ENT office on 05/13/2024 at 8:30AM for an appointment with ENT specialist Dr. Burke.  Return if you have any concerns.

## 2024-05-11 NOTE — ED Triage Notes (Signed)
 Pt arrives w/ c/o nose bleed that began at 9pm. Reports he does take warfarin. Has tried using afarin w/ no relief. Bleeding minimal at this time.

## 2024-05-11 NOTE — ED Provider Notes (Signed)
 " [place] EMERGENCY DEPARTMENT AT [facility] Provider Note   CSN: [id number] Arrival date & time: 05/11/24  9946     Patient presents with: Epistaxis   David [name] is a 64 y.o. male.   64 yo male on Coumadin  for mechanical valve presents with ongoing nose bleeds. Reports several prior visits, left side cauterized, had return of bleeding tonight. Had INR checked in clinic today with result of 5, told to hold his Coumadin  for the next few days. Bleeding currently stopped. Is scheduled to see ENT but not until 06/01/24. Last had his Coumadin  Sunday night (05/09/24).       Prior to Admission medications  Medication Sig Start Date End Date Taking? Authorizing Provider  aspirin  EC 81 MG EC tablet Take 1 tablet (81 mg total) by mouth daily. 08/17/14   Barrett, Erin R, PA-C  carvedilol  (COREG ) 3.125 MG tablet TAKE 1 TABLET BY MOUTH TWICE A DAY WITH FOOD 03/03/24   [name] Redell RAMAN, MD  Multiple Vitamin (MULTIVITAMIN) tablet Take 1 tablet by mouth daily.    [provider]  oxymetazoline  (AFRIN NASAL SPRAY) 0.05 % nasal spray Place 2 sprays into both nostrils 2 (two) times daily as needed for congestion. As needed for nosebleeds 05/04/24   Yolande Lamar BROCKS, MD  polyethylene glycol (MIRALAX ) 17 g packet Take 17 g by mouth daily. 05/04/24   Yolande Lamar BROCKS, MD  VITAMIN D  PO Take by mouth.    [provider]  warfarin (COUMADIN ) 5 MG tablet TAKE 1/2 TABLET TO 1 TABLET BY MOUTH DAILY OR AS DIRECTED BY ANTICOAGULATION CLINIC. 04/30/24   [name] Redell RAMAN, MD    Allergies: Clarithromycin    Review of Systems Negative except as per HPI Updated Vital Signs BP 127/70   Pulse 81   Temp 98.1 F (36.7 C)   Resp 16   SpO2 100%   Physical Exam Vitals and nursing note reviewed.  Constitutional:      General: He is not in acute distress.    Appearance: He is well-developed. He is not diaphoretic.  HENT:     Head: Normocephalic and atraumatic.     Nose:      Comments: Left nostril with recent cauterization, no active bleeding.     Mouth/Throat:     Mouth: Mucous membranes are moist.  Pulmonary:     Effort: Pulmonary effort is normal.  Skin:    General: Skin is warm and dry.  Neurological:     Mental Status: He is alert and oriented to person, place, and time.  Psychiatric:        Behavior: Behavior normal.     (all labs ordered are listed, but only abnormal results are displayed) Labs Reviewed  CBC - Abnormal; Notable for the following components:      Result Value   RBC 2.70 (*)    Hemoglobin 7.7 (*)    HCT 24.9 (*)    RDW 15.9 (*)    Platelets 86 (*)    nRBC 5.0 (*)    All other components within normal limits  PROTIME-INR - Abnormal; Notable for the following components:   Prothrombin Time 64.8 (*)    INR 7.2 (*)    All other components within normal limits  BASIC METABOLIC PANEL WITH GFR - Abnormal; Notable for the following components:   Glucose, Bld 107 (*)    All other components within normal limits  TYPE AND SCREEN  PREPARE RBC (CROSSMATCH)    EKG:  None  Radiology: No results found.   Procedures   Medications Ordered in the ED  0.9 %  sodium chloride  infusion (Manually program via Guardrails IV Fluids) (has no administration in time range)  oxymetazoline  (AFRIN) 0.05 % nasal spray 1 spray (1 spray Each Nare Given 05/11/24 0110)                                    Medical Decision Making Amount and/or Complexity of Data Reviewed Labs: ordered.  Risk OTC drugs.   This patient presents to the ED for concern of epistaxis, this involves an extensive number of treatment options, and is a complaint that carries with it a high risk of complications and morbidity.  The differential diagnosis includes but not limited to anemia, supra therapeutic INR,    Co morbidities / Chronic conditions that complicate the patient evaluation  Mechanical valve on coumadin , anemia, GERD, HLD, bacterial  endocarditis   Additional history obtained:  Additional history obtained from EMR External records from outside source obtained and reviewed including prior labs on file. Prior ER records for epistaxis reviewed.    Lab Tests:  I Ordered, and personally interpreted labs.  The pertinent results include:  INR supra therapeutic at 7.2. Hgb 7.7, down from 9.5. BMP pending at time of sign out to oncoming provider.    Problem List / ED Course / Critical interventions / Medication management  64 yo male with concern for ongoing left side epistaxis in the setting of supra therapeutic INR. INR 7.2 with Hgb 7.7, previously 9.5. Discussed with attending, will hold on vit K at this time as no active bleeding, INR goal 2.5-3.5 for mechanical valve. Plan is for transfusion of 1 U PRBCs. Monitor for bleeding. May need ENT if bleeding resumes, otherwise, may need call to ENT office for earlier appointment.  I ordered medication including 1 u PRBC   I have reviewed the patients home medicines and have made adjustments as needed   Consultations Obtained:  I requested consultation with the ER attending, Dr. Bari,  and discussed lab and imaging findings as well as pertinent plan - they recommend: agrees with plan of care   Social Determinants of Health:  Has PCP   Test / Admission - Considered:  Dispo pending at time of sign out to oncoming provider.       Final diagnoses:  Epistaxis  Abnormal INR  Anemia due to acute blood loss    ED Discharge Orders     None          David [name] David [name] 05/11/24 9379    Bari Charmaine FALCON, MD 05/11/24 [phone]  "

## 2024-05-12 LAB — GAMMA GT: GGT: 25 IU/L (ref 0–65)

## 2024-05-12 LAB — TYPE AND SCREEN
ABO/RH(D): O POS
Antibody Screen: NEGATIVE
Unit division: 0

## 2024-05-12 LAB — HEPATIC FUNCTION PANEL
ALT: 17 IU/L (ref 0–44)
AST: 35 IU/L (ref 0–40)
Albumin: 4.4 g/dL (ref 3.9–4.9)
Alkaline Phosphatase: 752 IU/L — ABNORMAL HIGH (ref 47–123)
Bilirubin Total: 0.3 mg/dL (ref 0.0–1.2)
Bilirubin, Direct: 0.09 mg/dL (ref 0.00–0.40)
Total Protein: 6.8 g/dL (ref 6.0–8.5)

## 2024-05-12 LAB — BPAM RBC
Blood Product Expiration Date: 202602132359
ISSUE DATE / TIME: 202601130839
Unit Type and Rh: 5100

## 2024-05-12 LAB — CBC
Hematocrit: 26.5 % — ABNORMAL LOW (ref 37.5–51.0)
Hemoglobin: 8.3 g/dL — ABNORMAL LOW (ref 13.0–17.7)
MCH: 28.1 pg (ref 26.6–33.0)
MCHC: 31.3 g/dL — ABNORMAL LOW (ref 31.5–35.7)
MCV: 90 fL (ref 79–97)
NRBC: 4 % — ABNORMAL HIGH (ref 0–0)
Platelets: 88 x10E3/uL — CL (ref 150–450)
RBC: 2.95 x10E6/uL — ABNORMAL LOW (ref 4.14–5.80)
RDW: 15.1 % (ref 11.6–15.4)
WBC: 3.5 x10E3/uL (ref 3.4–10.8)

## 2024-05-12 LAB — NUCLEOTIDASE, 5', BLOOD: 5-Nucleotidase: 10 IU/L (ref 0–11)

## 2024-05-14 ENCOUNTER — Ambulatory Visit (INDEPENDENT_AMBULATORY_CARE_PROVIDER_SITE_OTHER): Payer: Self-pay | Admitting: Pharmacist

## 2024-05-14 DIAGNOSIS — I059 Rheumatic mitral valve disease, unspecified: Secondary | ICD-10-CM | POA: Diagnosis not present

## 2024-05-14 DIAGNOSIS — Z7901 Long term (current) use of anticoagulants: Secondary | ICD-10-CM

## 2024-05-14 DIAGNOSIS — Z954 Presence of other heart-valve replacement: Secondary | ICD-10-CM

## 2024-05-14 LAB — POCT INR: INR: 1.7 — AB (ref 2.0–3.0)

## 2024-05-14 NOTE — Patient Instructions (Signed)
 Description   INR 1.7: Take a whole tablet today and then restart to 1 tablet daily except 1/2 tablet on Mondays, Wednesdays and Fridays.  Repeat INR in 1 week (normally 6 weeks).  Coumadin  Clinic [phone]

## 2024-05-14 NOTE — Progress Notes (Signed)
 Patient walked in concerned about INR level after ED visit.  Checked in room.  Description   INR 1.7: Take a whole tablet today and then restart to 1 tablet daily except 1/2 tablet on Mondays, Wednesdays and Fridays.  Repeat INR in 1 week (normally 6 weeks).  Coumadin  Clinic [phone]

## 2024-05-18 ENCOUNTER — Ambulatory Visit
Admission: RE | Admit: 2024-05-18 | Discharge: 2024-05-18 | Disposition: A | Discharge status: Home / Self Care | Source: Ambulatory Visit | Attending: Cardiology | Admitting: Cardiology

## 2024-05-18 ENCOUNTER — Ambulatory Visit (INDEPENDENT_AMBULATORY_CARE_PROVIDER_SITE_OTHER): Admitting: *Deleted

## 2024-05-18 DIAGNOSIS — I059 Rheumatic mitral valve disease, unspecified: Secondary | ICD-10-CM

## 2024-05-18 DIAGNOSIS — Z7901 Long term (current) use of anticoagulants: Secondary | ICD-10-CM | POA: Insufficient documentation

## 2024-05-18 DIAGNOSIS — I349 Nonrheumatic mitral valve disorder, unspecified: Secondary | ICD-10-CM | POA: Diagnosis not present

## 2024-05-18 DIAGNOSIS — Z954 Presence of other heart-valve replacement: Secondary | ICD-10-CM | POA: Diagnosis not present

## 2024-05-18 LAB — ECHOCARDIOGRAM COMPLETE
Area-P 1/2: 4.54 cm2
MV VTI: 1.22 cm2
S' Lateral: 3.4 cm

## 2024-05-18 LAB — POCT INR: INR: 2.1 (ref 2.0–3.0)

## 2024-05-18 NOTE — Progress Notes (Signed)
 Description   INR-2.1; Today take 1.5 tablets of warfarin then continue taking 1 tablet daily except 1/2 tablet on Mondays, Wednesday, and Fridays.  Repeat INR in 1 week (normally 6 weeks).  Coumadin  Clinic [phone]

## 2024-05-18 NOTE — Patient Instructions (Addendum)
 Description   INR-2.1; Today take 1.5 tablets of warfarin then continue taking 1 tablet daily except 1/2 tablet on Mondays, Wednesday, and Fridays.  Repeat INR in 1 week (normally 6 weeks).  Coumadin  Clinic [phone]

## 2024-05-21 ENCOUNTER — Ambulatory Visit: Payer: Self-pay

## 2024-05-21 NOTE — Telephone Encounter (Signed)
 FYI Only or Action Required?: Action required by provider: request for documentation or forms.  Patient was last seen in primary care on 05/10/2024 by [name], FNP.  Called Nurse Triage reporting Disability Paperwork.  Symptoms began n/a.  Interventions attempted: Other: n/a.  Symptoms are: n/a.  Triage Disposition: Call PCP When Office is Open  Patient/caregiver understands and will follow disposition?: Yes   Reason for Disposition  [1] Caller requesting NON-URGENT health information AND [2] PCP's office is the best resource  Answer Assessment - Initial Assessment Questions Pt states he needs disability paperwork completed and wants to know if he can drop it off at the office to be completed or if he needs an appointment, please advise. [phone]  1. REASON FOR CALL: What is the main reason for your call? or How can I best help you?     Pt calling to let provider know of frequent nosebleeds, and being managed by ENT, also wants disability paperwork completed  2. SYMPTOMS : Do you have any symptoms?      Denies at this time. Had left nostril cauterized at ENT  Protocols used: Information Only Call - No Triage-A-AH  Message from [place] E sent at 05/21/2024 10:48 AM EST  Reason for Triage: Says his coumadin  levels have been fluctuating, had to go to ENT yesterday for nose bleeds (chronic condition)  Also needs short term disability paperwork completed. Will be dropping off paperwork at office.  Best contact: [phone]

## 2024-05-23 ENCOUNTER — Emergency Department (HOSPITAL_COMMUNITY)
Admission: EM | Admit: 2024-05-23 | Discharge: 2024-05-24 | Disposition: A | Attending: Emergency Medicine | Admitting: Emergency Medicine

## 2024-05-23 ENCOUNTER — Encounter (HOSPITAL_COMMUNITY): Payer: Self-pay | Admitting: Emergency Medicine

## 2024-05-23 ENCOUNTER — Other Ambulatory Visit: Payer: Self-pay

## 2024-05-23 DIAGNOSIS — R040 Epistaxis: Secondary | ICD-10-CM | POA: Diagnosis present

## 2024-05-23 DIAGNOSIS — D61818 Other pancytopenia: Secondary | ICD-10-CM | POA: Insufficient documentation

## 2024-05-23 DIAGNOSIS — Z7901 Long term (current) use of anticoagulants: Secondary | ICD-10-CM | POA: Diagnosis not present

## 2024-05-23 LAB — CBC WITH DIFFERENTIAL/PLATELET
Abs Immature Granulocytes: 0.25 10*3/uL — ABNORMAL HIGH (ref 0.00–0.07)
Basophils Absolute: 0 10*3/uL (ref 0.0–0.1)
Basophils Relative: 1 %
Eosinophils Absolute: 0.1 10*3/uL (ref 0.0–0.5)
Eosinophils Relative: 1 %
HCT: 26.2 % — ABNORMAL LOW (ref 39.0–52.0)
Hemoglobin: 8.4 g/dL — ABNORMAL LOW (ref 13.0–17.0)
Immature Granulocytes: 4 %
Lymphocytes Relative: 22 %
Lymphs Abs: 1.3 10*3/uL (ref 0.7–4.0)
MCH: 28.3 pg (ref 26.0–34.0)
MCHC: 32.1 g/dL (ref 30.0–36.0)
MCV: 88.2 fL (ref 80.0–100.0)
Monocytes Absolute: 0.6 10*3/uL (ref 0.1–1.0)
Monocytes Relative: 10 %
Neutro Abs: 3.7 10*3/uL (ref 1.7–7.7)
Neutrophils Relative %: 62 %
Platelets: 73 10*3/uL — ABNORMAL LOW (ref 150–400)
RBC: 2.97 MIL/uL — ABNORMAL LOW (ref 4.22–5.81)
RDW: 16.1 % — ABNORMAL HIGH (ref 11.5–15.5)
WBC: 5.9 10*3/uL (ref 4.0–10.5)
nRBC: 8 % — ABNORMAL HIGH (ref 0.0–0.2)

## 2024-05-23 LAB — PROTIME-INR
INR: 3.9 — ABNORMAL HIGH (ref 0.8–1.2)
Prothrombin Time: 39.7 s — ABNORMAL HIGH (ref 11.4–15.2)

## 2024-05-23 MED ORDER — TRANEXAMIC ACID FOR EPISTAXIS
500.0000 mg | Freq: Once | TOPICAL | Status: AC
  Administered 2024-05-23: 500 mg via TOPICAL
  Filled 2024-05-23: qty 10

## 2024-05-23 MED ORDER — OXYMETAZOLINE HCL 0.05 % NA SOLN
1.0000 | Freq: Once | NASAL | Status: AC
  Administered 2024-05-23: 1 via NASAL
  Filled 2024-05-23: qty 30

## 2024-05-23 NOTE — ED Provider Notes (Incomplete)
 " [facility] DEPT [facility] Emergency Department Provider Note MRN:  [id number]  Arrival date & time: 05/24/24     Chief Complaint   Epistaxis   History of Present Illness   David [name] is a 64 y.o. year-old male presents to the ED with chief complaint of nose bleeding.  States that he has had intermittent bleeding for the past month.  Was seen on 1/15 and 1/22 by ENT.  Has been using afrin and NS sprays.  Is anticoagulated on Coumadin  for mechanical valve.  States that he has had some mild bleeding tonight.  Is concerned about his HGB and INR.  History provided by patient.   Review of Systems  Pertinent positive and negative review of systems noted in HPI.    Physical Exam   Vitals:   05/23/24 2200  BP: 139/68  Pulse: 97  Resp: 18  Temp: 98.8 F (37.1 C)  SpO2: 99%    CONSTITUTIONAL:  non toxic-appearing, NAD NEURO:  Alert and oriented x 3, CN 3-12 grossly intact EYES:  eyes equal and reactive ENT/NECK:  Supple, no stridor, no active bleeding from nose CARDIO:  normal rate, regular rhythm, appears well-perfused  PULM:  No respiratory distress, CTAB GI/GU:  non-distended,  MSK/SPINE:  No gross deformities, no edema, moves all extremities  SKIN:  no rash, atraumatic   *Additional and/or pertinent findings included in MDM below  Diagnostic and Interventional Summary    EKG Interpretation Date/Time:    Ventricular Rate:    PR Interval:    QRS Duration:    QT Interval:    QTC Calculation:   R Axis:      Text Interpretation:         Labs Reviewed  CBC WITH DIFFERENTIAL/PLATELET - Abnormal; Notable for the following components:      Result Value   RBC 2.97 (*)    Hemoglobin 8.4 (*)    HCT 26.2 (*)    RDW 16.1 (*)    Platelets 73 (*)    nRBC 8.0 (*)    Abs Immature Granulocytes 0.25 (*)    All other components within normal limits  PROTIME-INR - Abnormal; Notable for the following components:   Prothrombin Time 39.7 (*)    INR 3.9 (*)     All other components within normal limits    No orders to display    Medications  tranexamic acid  (CYKLOKAPRON ) 1000 MG/10ML topical solution 500 mg (500 mg Topical Given 05/23/24 2226)  oxymetazoline  (AFRIN) 0.05 % nasal spray 1 spray (1 spray Each Nare Given 05/23/24 2310)  cephALEXin  (KEFLEX ) capsule 500 mg (500 mg Oral Given 05/24/24 0055)     Procedures  /  Critical Care Epistaxis Management  Date/Time: 05/23/2024 11:40 PM  Performed by: [name], PA-C Authorized by: [name], PA-C   Consent:    Consent obtained:  Verbal   Consent given by:  Patient   Risks, benefits, and alternatives were discussed: yes     Risks discussed:  Bleeding, infection, nasal injury and pain   Alternatives discussed:  No treatment, delayed treatment and observation Universal protocol:    Procedure explained and questions answered to patient or proxy's satisfaction: yes     Relevant documents present and verified: yes     Test results available: yes     Imaging studies available: yes     Required blood products, implants, devices, and special equipment available: yes     Site/side marked: yes     Immediately prior  to procedure, a time out was called: yes     Patient identity confirmed:  Verbally with patient Anesthesia:    Anesthesia method:  None Procedure details:    Treatment site:  R anterior and L anterior   Treatment method:  Anterior pack   Treatment complexity:  Limited   Treatment episode: recurring   Post-procedure details:    Assessment:  Bleeding stopped   Procedure completion:  Tolerated well, no immediate complications   ED Course and Medical Decision Making  I have reviewed the triage vital signs, the nursing notes, and pertinent available records from the EMR.  Social Determinants Affecting Complexity of Care: Patient has no clinically significant social determinants affecting this chief complaint..   ED Course: Clinical Course as of 05/24/24 0117  [place] May 23, 2024  2340 Patient still having some bleeding despite Afrin, nebulized TXA, and pressure.  Proceeded with nasal packing, anterior. [RB]  Mon May 24, 2024  0022 After right sided nasal packing, patient began bleeding from the left.  I consulted with Dr. Jesus, who recommends bilateral packing. [RB]    Clinical Course User Index [RB] [name], PA-C    Medical Decision Making Amount and/or Complexity of Data Reviewed Labs: ordered.  Risk OTC drugs. Prescription drug management.         Consultants: I consulted with Dr. Jesus following the initial R anterior packing, which is controlling the R, but he is now having L sided bleeding.  Dr. Jesus recommends packing bilaterally.  If this stops the bleeding, Dr. Jesus states that he can be discharged with outpatient follow-up.  I asked him if he needed admission due to bilateral packs, and Dr. Jesus said that as long as it was NOT posterior or deep balloon packs, then bilateral packing is ok for outpatient.    Both are only anterior packing.  Bleeding resolved after bilateral packing.  Will DC on Keflex .  He will follow-up in ENT clinic, coumadin  clinic, and with hematology for his pancytopenia.   Treatment and Plan:   Patient discussed with attending physician, Dr. Palumbo, who recommends PCP follow-up.  We discussed the thrombocytopenia.  Discussed potentially following up with hematology, but will start with PCP.  Final Clinical Impressions(s) / ED Diagnoses     ICD-10-CM   1. Epistaxis  R04.0     2. Pancytopenia [facility])  D61.818 Ambulatory referral to Hematology / Oncology      ED Discharge Orders          Ordered    cephALEXin  (KEFLEX ) 500 MG capsule  2 times daily        05/24/24 0112    Ambulatory referral to Hematology / Oncology       Comments: Your emergency department provider has referred you to see a hematology/oncology specialist. These are physicians who specialize in blood disorders and cancers, or  findings concerning for cancer. You will receive a phone call from the [facility] Office to set up your appointment within 2 business days: [employer] operate Mon - Fri, 8:00 a.m. to 5:00 p.m.; closed for federally recognized holidays. Please be sure your phone is not set to block numbers during this time.   05/23/24 2343              Discharge Instructions Discussed with and Provided to Patient:     Discharge Instructions      You need to have the packing removed in about 3 days.  Please call Dr. Godfrey office tomorrow.  Skip your coumadin  tonight.  Follow-up with the coumadin  clinic.  Take antibiotics as directed.  If your symptoms change or worsen, return to the ER.       [name], PA-C 05/24/24 0117    [name], PA-C 05/24/24 0118  "

## 2024-05-23 NOTE — ED Triage Notes (Addendum)
 Pt bib ems from home with c/o epistaxis 15 minutes prior to ems arrival, used afrin. Ems states that bleeding is coming and going. Takes warfarin. Had nostril cauterized a few day pta. Bleeding controlled at this time.  154/58, 122/76 [id number]

## 2024-05-24 MED ORDER — CEPHALEXIN 500 MG PO CAPS: 500.0000 mg | ORAL_CAPSULE | Freq: Two times a day (BID) | ORAL | 0 refills | Status: AC

## 2024-05-24 MED ORDER — CEPHALEXIN 500 MG PO CAPS
500.0000 mg | ORAL_CAPSULE | Freq: Once | ORAL | Status: AC
  Administered 2024-05-24: 500 mg via ORAL
  Filled 2024-05-24: qty 1

## 2024-05-24 NOTE — Discharge Instructions (Addendum)
 You need to have the packing removed in about 3 days.  Please call Dr. Godfrey office tomorrow.   Skip your coumadin  tonight.  Follow-up with the coumadin  clinic.  Take antibiotics as directed.  If your symptoms change or worsen, return to the ER.

## 2024-05-24 NOTE — ED Notes (Signed)
 Pt ambulated 1x lap around the ED. Small amount of drip noted from nose packing but no further bleeding

## 2024-05-25 ENCOUNTER — Encounter: Payer: Self-pay | Admitting: Cardiology

## 2024-05-25 ENCOUNTER — Telehealth (HOSPITAL_BASED_OUTPATIENT_CLINIC_OR_DEPARTMENT_OTHER): Payer: Self-pay | Admitting: *Deleted

## 2024-05-25 DIAGNOSIS — Z0279 Encounter for issue of other medical certificate: Secondary | ICD-10-CM

## 2024-05-25 NOTE — Telephone Encounter (Signed)
 Mychart sent to pt.

## 2024-05-25 NOTE — Telephone Encounter (Signed)
 Copied from CRM #[id number]. Topic: Medical Record Request - Records Request >> May 25, 2024 11:23 AM Wyona P wrote: Reason for CRM: PT called in to see about speaking to primary nurse for some assistance on filling out FMLA paperwork and medical forms for his job.   Please follow up with pt before end of day

## 2024-05-26 ENCOUNTER — Emergency Department (HOSPITAL_COMMUNITY)
Admission: EM | Admit: 2024-05-26 | Discharge: 2024-05-26 | Disposition: A | Discharge status: Home / Self Care | Attending: Emergency Medicine | Admitting: Emergency Medicine

## 2024-05-26 ENCOUNTER — Ambulatory Visit

## 2024-05-26 ENCOUNTER — Ambulatory Visit: Admitting: *Deleted

## 2024-05-26 ENCOUNTER — Encounter (HOSPITAL_BASED_OUTPATIENT_CLINIC_OR_DEPARTMENT_OTHER): Payer: Self-pay

## 2024-05-26 DIAGNOSIS — R040 Epistaxis: Secondary | ICD-10-CM | POA: Diagnosis present

## 2024-05-26 DIAGNOSIS — Z4800 Encounter for change or removal of nonsurgical wound dressing: Secondary | ICD-10-CM | POA: Diagnosis not present

## 2024-05-26 DIAGNOSIS — Z7901 Long term (current) use of anticoagulants: Secondary | ICD-10-CM

## 2024-05-26 DIAGNOSIS — Z7982 Long term (current) use of aspirin: Secondary | ICD-10-CM | POA: Insufficient documentation

## 2024-05-26 DIAGNOSIS — Z954 Presence of other heart-valve replacement: Secondary | ICD-10-CM

## 2024-05-26 DIAGNOSIS — I059 Rheumatic mitral valve disease, unspecified: Secondary | ICD-10-CM

## 2024-05-26 LAB — POCT INR: INR: 3.2 — AB (ref 2.0–3.0)

## 2024-05-26 NOTE — ED Provider Notes (Signed)
 " [place] EMERGENCY DEPARTMENT AT [facility] Provider Note   CSN: [id number] Arrival date & time: 05/26/24  1038     Patient presents with: packing removal   David [name] is a 64 y.o. male with past medical history significant for anticoagulation on Coumadin  for mechanical valve who presents with bilateral epistaxis on 1/26, has had bleeding, at that time with elevated INR.  Today here for recheck and Rhino Rocket removal.    HPI     Prior to Admission medications  Medication Sig Start Date End Date Taking? Authorizing Provider  aspirin  EC 81 MG EC tablet Take 1 tablet (81 mg total) by mouth daily. 08/17/14   Barrett, Erin R, PA-C  carvedilol  (COREG ) 3.125 MG tablet TAKE 1 TABLET BY MOUTH TWICE A DAY WITH FOOD 03/03/24   [name] Redell RAMAN, MD  cephALEXin  (KEFLEX ) 500 MG capsule Take 1 capsule (500 mg total) by mouth 2 (two) times daily. 05/24/24   [name], PA-C  Multiple Vitamin (MULTIVITAMIN) tablet Take 1 tablet by mouth daily.    [provider]  oxymetazoline  (AFRIN NASAL SPRAY) 0.05 % nasal spray Place 2 sprays into both nostrils 2 (two) times daily as needed for congestion. As needed for nosebleeds 05/04/24   Yolande Charleston BROCKS, MD  polyethylene glycol (MIRALAX ) 17 g packet Take 17 g by mouth daily. 05/04/24   Yolande Charleston BROCKS, MD  VITAMIN D  PO Take by mouth.    [provider]  warfarin (COUMADIN ) 5 MG tablet TAKE 1/2 TABLET TO 1 TABLET BY MOUTH DAILY OR AS DIRECTED BY ANTICOAGULATION CLINIC. 04/30/24   [name] Redell RAMAN, MD    Allergies: Clarithromycin    Review of Systems  All other systems reviewed and are negative.   Updated Vital Signs BP 125/75 (BP Location: Left Arm)   Pulse 89   Temp (!) 97.5 F (36.4 C) (Oral)   Resp 20   SpO2 100%   Physical Exam Vitals and nursing note reviewed.  Constitutional:      General: He is not in acute distress.    Appearance: Normal appearance.  HENT:     Head: Normocephalic and  atraumatic.     Nose:     Comments: Appropriate appearance of nares with Rhino Rocket's in place, after removal very mild ooze from left nare improved with pressure alone. Eyes:     General:        Right eye: No discharge.        Left eye: No discharge.  Cardiovascular:     Rate and Rhythm: Normal rate and regular rhythm.  Pulmonary:     Effort: Pulmonary effort is normal. No respiratory distress.  Musculoskeletal:        General: No deformity.  Skin:    General: Skin is warm and dry.  Neurological:     Mental Status: He is alert and oriented to person, place, and time.  Psychiatric:        Mood and Affect: Mood normal.        Behavior: Behavior normal.     (all labs ordered are listed, but only abnormal results are displayed) Labs Reviewed - No data to display  EKG: None  Radiology: No results found.   Procedures   Medications Ordered in the ED - No data to display  Medical Decision Making  Overall well-appearing 64 year old male with past medical history significant for anticoagulation on warfarin, elevated INR who presents concern for Rhino Rocket removal, epistaxis recheck.  Rhino Rocket is removed in the emergency department, patient tolerated without significant difficulty.  Monitored for 5-10 minutes in the emergency department to ensure no return of bleeding.  On reassessment continuing to have no frank epistaxis, some very mild ooze from the left nare, which is slowing with pressure alone, stable for discharge, patient is well-established with ENT, encouraged close ENT follow-up.  Patient otherwise stable for discharge at this time.  Final diagnoses:  Epistaxis    ED Discharge Orders     None          Rosan Sherlean DEL, PA-C 05/26/24 1132    Ruthe Cornet, DO 05/26/24 1152  "

## 2024-05-26 NOTE — Progress Notes (Cosign Needed Addendum)
 Description   INR-3.2;Continue taking 1 tablet daily except 1/2 tablet on Mondays, Wednesday, and Fridays. Repeat INR in 2 weeks (normally 6 weeks).  Coumadin  Clinic [phone]

## 2024-05-26 NOTE — ED Triage Notes (Signed)
 Patient reports he needs the packing removed from his nose. Denies any symptoms. Patient is alert and oriented x 4. Airway patent, respirations even and unlabored. Skin normal, warm and dry.

## 2024-05-26 NOTE — Patient Instructions (Addendum)
 Description   INR-3.2;Continue taking 1 tablet daily except 1/2 tablet on Mondays, Wednesday, and Fridays. Repeat INR in 2 weeks (normally 6 weeks).  Coumadin  Clinic [phone]

## 2024-05-26 NOTE — Discharge Instructions (Signed)
 Follow-up closely with your ENT doctor, primary care doctor.  You can hold firm pressure if you have any ongoing oozing.

## 2024-05-27 ENCOUNTER — Telehealth (HOSPITAL_BASED_OUTPATIENT_CLINIC_OR_DEPARTMENT_OTHER): Payer: Self-pay | Admitting: *Deleted

## 2024-05-27 NOTE — Telephone Encounter (Signed)
 Copied from CRM #[id number]. Topic: General - Other >> May 27, 2024 10:26 AM Avram MATSU wrote: Reason for CRM: patient would like the FMLA paper to be completed to protect his job and to be paid while out. Patient stated it is due by the 6th of February. Patient was informed of turn around time.

## 2024-05-27 NOTE — Telephone Encounter (Signed)
 Routing to Maralyn Sago for review.

## 2024-05-27 NOTE — Telephone Encounter (Signed)
 Message sent to pt. Will await a response.

## 2024-05-30 ENCOUNTER — Encounter (HOSPITAL_COMMUNITY): Payer: Self-pay

## 2024-05-30 ENCOUNTER — Observation Stay (HOSPITAL_COMMUNITY): Admission: EM | Admit: 2024-05-30 | Source: Home / Self Care | Attending: Family Medicine | Admitting: Family Medicine

## 2024-05-30 DIAGNOSIS — K029 Dental caries, unspecified: Secondary | ICD-10-CM

## 2024-05-30 DIAGNOSIS — D696 Thrombocytopenia, unspecified: Secondary | ICD-10-CM | POA: Insufficient documentation

## 2024-05-30 DIAGNOSIS — R040 Epistaxis: Principal | ICD-10-CM | POA: Diagnosis present

## 2024-05-30 DIAGNOSIS — Z954 Presence of other heart-valve replacement: Secondary | ICD-10-CM

## 2024-05-30 DIAGNOSIS — D62 Acute posthemorrhagic anemia: Secondary | ICD-10-CM

## 2024-05-30 DIAGNOSIS — R791 Abnormal coagulation profile: Secondary | ICD-10-CM

## 2024-05-30 DIAGNOSIS — I5021 Acute systolic (congestive) heart failure: Secondary | ICD-10-CM | POA: Insufficient documentation

## 2024-05-30 MED ORDER — TRANEXAMIC ACID FOR EPISTAXIS
500.0000 mg | Freq: Once | TOPICAL | Status: AC
  Administered 2024-05-31: 500 mg via TOPICAL
  Filled 2024-05-30 (×2): qty 10

## 2024-05-30 NOTE — ED Triage Notes (Signed)
 Patient arrived with a nosebleed over the last 5 hours, last dose of Afrin 30 min ago. Patient takes Warfarin. Complaints of starting to feel weak.

## 2024-05-31 ENCOUNTER — Other Ambulatory Visit: Payer: Self-pay

## 2024-05-31 ENCOUNTER — Emergency Department (HOSPITAL_COMMUNITY)

## 2024-05-31 ENCOUNTER — Encounter (HOSPITAL_COMMUNITY): Payer: Self-pay | Admitting: Internal Medicine

## 2024-05-31 DIAGNOSIS — K029 Dental caries, unspecified: Secondary | ICD-10-CM

## 2024-05-31 DIAGNOSIS — R040 Epistaxis: Secondary | ICD-10-CM | POA: Diagnosis not present

## 2024-05-31 DIAGNOSIS — D62 Acute posthemorrhagic anemia: Secondary | ICD-10-CM

## 2024-05-31 DIAGNOSIS — R791 Abnormal coagulation profile: Secondary | ICD-10-CM

## 2024-05-31 DIAGNOSIS — H748X3 Other specified disorders of middle ear and mastoid, bilateral: Secondary | ICD-10-CM | POA: Insufficient documentation

## 2024-05-31 DIAGNOSIS — I5021 Acute systolic (congestive) heart failure: Secondary | ICD-10-CM | POA: Insufficient documentation

## 2024-05-31 LAB — CBC WITH DIFFERENTIAL/PLATELET
Abs Immature Granulocytes: 0.43 10*3/uL — ABNORMAL HIGH (ref 0.00–0.07)
Basophils Absolute: 0.1 10*3/uL (ref 0.0–0.1)
Basophils Relative: 1 %
Eosinophils Absolute: 0.1 10*3/uL (ref 0.0–0.5)
Eosinophils Relative: 1 %
HCT: 20.6 % — ABNORMAL LOW (ref 39.0–52.0)
Hemoglobin: 6.4 g/dL — CL (ref 13.0–17.0)
Immature Granulocytes: 8 %
Lymphocytes Relative: 26 %
Lymphs Abs: 1.5 10*3/uL (ref 0.7–4.0)
MCH: 27.8 pg (ref 26.0–34.0)
MCHC: 31.1 g/dL (ref 30.0–36.0)
MCV: 89.6 fL (ref 80.0–100.0)
Monocytes Absolute: 0.3 10*3/uL (ref 0.1–1.0)
Monocytes Relative: 6 %
Neutro Abs: 3.3 10*3/uL (ref 1.7–7.7)
Neutrophils Relative %: 58 %
Platelets: 120 10*3/uL — ABNORMAL LOW (ref 150–400)
RBC: 2.3 MIL/uL — ABNORMAL LOW (ref 4.22–5.81)
RDW: 16.6 % — ABNORMAL HIGH (ref 11.5–15.5)
WBC: 5.7 10*3/uL (ref 4.0–10.5)
nRBC: 10.4 % — ABNORMAL HIGH (ref 0.0–0.2)

## 2024-05-31 LAB — BASIC METABOLIC PANEL WITH GFR
Anion gap: 12 (ref 5–15)
Anion gap: 9 (ref 5–15)
BUN: 18 mg/dL (ref 8–23)
BUN: 21 mg/dL (ref 8–23)
CO2: 24 mmol/L (ref 22–32)
CO2: 25 mmol/L (ref 22–32)
Calcium: 8.9 mg/dL (ref 8.9–10.3)
Calcium: 9.2 mg/dL (ref 8.9–10.3)
Chloride: 102 mmol/L (ref 98–111)
Chloride: 103 mmol/L (ref 98–111)
Creatinine, Ser: 0.74 mg/dL (ref 0.61–1.24)
Creatinine, Ser: 0.78 mg/dL (ref 0.61–1.24)
GFR, Estimated: >60 mL/min
GFR, Estimated: >60 mL/min
Glucose, Bld: 104 mg/dL — ABNORMAL HIGH (ref 70–99)
Glucose, Bld: 129 mg/dL — ABNORMAL HIGH (ref 70–99)
Potassium: 4 mmol/L (ref 3.5–5.1)
Potassium: 4.4 mmol/L (ref 3.5–5.1)
Sodium: 136 mmol/L (ref 135–145)
Sodium: 139 mmol/L (ref 135–145)

## 2024-05-31 LAB — HIV ANTIBODY (ROUTINE TESTING W REFLEX): HIV Screen 4th Generation wRfx: NONREACTIVE

## 2024-05-31 LAB — PROTIME-INR
INR: 6 (ref 0.8–1.2)
INR: 2.6 — ABNORMAL HIGH (ref 0.8–1.2)
Prothrombin Time: 56.1 s — ABNORMAL HIGH (ref 11.4–15.2)
Prothrombin Time: 29.2 s — ABNORMAL HIGH (ref 11.4–15.2)

## 2024-05-31 LAB — PREPARE RBC (CROSSMATCH)

## 2024-05-31 LAB — HEMOGLOBIN AND HEMATOCRIT, BLOOD
HCT: 17.5 % — ABNORMAL LOW (ref 39.0–52.0)
Hemoglobin: 5.7 g/dL — CL (ref 13.0–17.0)

## 2024-05-31 MED ORDER — SODIUM CHLORIDE 0.9% IV SOLUTION: Freq: Once | INTRAVENOUS | Status: AC

## 2024-05-31 MED ORDER — ACETAMINOPHEN 650 MG RE SUPP: 650.0000 mg | Freq: Four times a day (QID) | RECTAL | Status: AC | PRN

## 2024-05-31 MED ORDER — CARVEDILOL 3.125 MG PO TABS
3.1250 mg | ORAL_TABLET | Freq: Two times a day (BID) | ORAL | Status: AC
  Administered 2024-05-31 – 2024-06-04 (×10): 3.125 mg via ORAL
  Filled 2024-05-31 (×10): qty 1

## 2024-05-31 MED ORDER — SODIUM CHLORIDE 0.9 % IV SOLN
3.0000 g | Freq: Four times a day (QID) | INTRAVENOUS | Status: DC
  Administered 2024-05-31: 3 g via INTRAVENOUS
  Filled 2024-05-31: qty 8

## 2024-05-31 MED ORDER — SODIUM CHLORIDE 0.9 % IV SOLN
1.5000 g | Freq: Once | INTRAVENOUS | Status: AC
  Administered 2024-05-31: 1.5 g via INTRAVENOUS
  Filled 2024-05-31: qty 4

## 2024-05-31 MED ORDER — PNEUMOCOCCAL 20-VAL CONJ VACC 0.5 ML IM SUSY
0.5000 mL | PREFILLED_SYRINGE | INTRAMUSCULAR | Status: AC
  Administered 2024-06-01: 0.5 mL via INTRAMUSCULAR
  Filled 2024-05-31: qty 0.5

## 2024-05-31 MED ORDER — VITAMIN K1 10 MG/ML IJ SOLN
10.0000 mg | Freq: Once | INTRAVENOUS | Status: AC
  Administered 2024-05-31: 10 mg via INTRAVENOUS
  Filled 2024-05-31: qty 1

## 2024-05-31 MED ORDER — ACETAMINOPHEN 325 MG PO TABS: 650.0000 mg | ORAL_TABLET | Freq: Four times a day (QID) | ORAL | Status: AC | PRN

## 2024-05-31 MED ORDER — INFLUENZA VIRUS VACC SPLIT PF (FLUZONE) 0.5 ML IM SUSY
0.5000 mL | PREFILLED_SYRINGE | INTRAMUSCULAR | Status: AC
  Administered 2024-06-01: 0.5 mL via INTRAMUSCULAR
  Filled 2024-05-31: qty 0.5

## 2024-05-31 MED ORDER — AMOXICILLIN-POT CLAVULANATE 875-125 MG PO TABS
1.0000 | ORAL_TABLET | Freq: Two times a day (BID) | ORAL | Status: AC
  Administered 2024-05-31 – 2024-06-04 (×9): 1 via ORAL
  Filled 2024-05-31 (×9): qty 1

## 2024-05-31 MED ORDER — OXYMETAZOLINE HCL 0.05 % NA SOLN
1.0000 | Freq: Once | NASAL | Status: AC
  Administered 2024-05-31: 1 via NASAL

## 2024-05-31 MED ORDER — IOHEXOL 300 MG/ML  SOLN
75.0000 mL | Freq: Once | INTRAMUSCULAR | Status: AC | PRN
  Administered 2024-05-31: 75 mL via INTRAVENOUS

## 2024-05-31 NOTE — Progress Notes (Signed)
" °  Progress Note   Patient: David [name] FMW:[id number] DOB: April 04, 1961 DOA: 05/30/2024     0 DOS: the patient was seen and examined on 05/31/2024   Brief hospital course: 64 year old man PMH including bacterial endocarditis, status post mechanical mitral valve replacement maintained on warfarin, presented with chronic epistaxis from nares since mid December.  Found to be markedly anemic with supratherapeutic INR.  INR treated with FFP and vitamin K , Rhino Rocket placed left nare, EDP spoke with ENT who recommended outpatient follow-up for epistaxis and hemotympanum.  Consultants   Procedures/Events 2/2 admit for anemia, high INR  Assessment and Plan: Severe symptomatic anemia secondary to subacute blood loss Intermittent epistaxis Chronic long-term anticoagulation with supratherapeutic INR ABLA Given FFP and vitamin K  in the emergency department.  INR therapeutic today. Initially transfused 1 unit PRBC, with decrease in hemoglobin thereafter, therefore transfused 2 units additionally 2/2 Trend hemoglobin.  There is no evidence of ongoing bleeding. Maintain Rhino Rocket in place.  Will need to follow-up with ENT in 3 to 5 days. Transfuse further as indicated.  Mechanical mitral valve INR 6 on admission, 2.6 today Goal INR is 2.5-3.5 Once INR falls below 2.5, start heparin   Dental caries CT showed periapical lucency Empiric antibiotics, follow-up with dentistry as an outpatient  HFmrEF Echo January 20 showed LVEF 40-45%. Stable.     Subjective:  Feels better today.  Reports intermittent epistaxis since mid December.  Followed by ENT as an outpatient, last saw Dr. Jesus 1/30.  No current bleeding.  Physical Exam: Vitals:   05/31/24 1154 05/31/24 1212 05/31/24 1219 05/31/24 1453  BP: (!) 133/57 (!) 149/73  (!) 164/79  Pulse: 80 85  85  Resp: 17 18 18 19   Temp: 98.3 F (36.8 C) 98.3 F (36.8 C)  98 F (36.7 C)  TempSrc: Oral Oral  Oral  SpO2: 100% 100%  99%  Weight:       Height:       Physical Exam Vitals reviewed.  Constitutional:      General: He is not in acute distress.    Appearance: He is not ill-appearing or toxic-appearing.  HENT:     Nose:     Comments: Left Rhino Rocket in place.    Mouth/Throat:     Comments: Poor dentition with caries. Cardiovascular:     Rate and Rhythm: Normal rate and regular rhythm.     Heart sounds: No murmur heard. Pulmonary:     Effort: Pulmonary effort is normal. No respiratory distress.     Breath sounds: No wheezing, rhonchi or rales.  Neurological:     Mental Status: He is alert.  Psychiatric:        Mood and Affect: Mood normal.        Behavior: Behavior normal.     Data Reviewed: BMP unremarkable Admission hemoglobin 6.4, repeat 5.7 on repeat, platelets 120 Admission INR 6.0, repeat today 2.6 CT showed periapical lucency left first maxillary premolar, mucosal disease left maxillary sinus, middle ear cavities and mastoid air cells clear. CT head no acute abnormality  Family Communication:   Disposition: Status is: Observation      Time spent: 35 minutes  Author: Toribio Door, MD 05/31/2024 4:48 PM  For on call review [email or web address].    "

## 2024-05-31 NOTE — ED Notes (Signed)
 Report given to 4 th floor

## 2024-05-31 NOTE — ED Notes (Addendum)
 Critical from lab, INR 6.0. Cardama MD notified.

## 2024-05-31 NOTE — Hospital Course (Signed)
 64 year old man PMH including bacterial endocarditis, status post mechanical mitral valve replacement maintained on warfarin, presented with chronic epistaxis from nares since mid December.  Found to be markedly anemic with supratherapeutic INR.  INR treated with FFP and vitamin K , Rhino Rocket placed left nare, EDP spoke with ENT who recommended outpatient follow-up for epistaxis and hemotympanum.  Consultants   Procedures/Events 2/2 admit for anemia, high INR

## 2024-05-31 NOTE — Plan of Care (Incomplete)

## 2024-06-01 DIAGNOSIS — D696 Thrombocytopenia, unspecified: Secondary | ICD-10-CM | POA: Insufficient documentation

## 2024-06-01 LAB — TYPE AND SCREEN
ABO/RH(D): O POS
Antibody Screen: NEGATIVE
Unit division: 0
Unit division: 0
Unit division: 0
Unit division: 0

## 2024-06-01 LAB — BPAM FFP
Blood Product Expiration Date: 202602030316
ISSUE DATE / TIME: 202602020339
Unit Type and Rh: 5100

## 2024-06-01 LAB — BPAM RBC
Blood Product Expiration Date: 202602212359
Blood Product Expiration Date: 202602222359
Blood Product Expiration Date: 202603022359
Blood Product Expiration Date: 202603022359
ISSUE DATE / TIME: 202602020054
ISSUE DATE / TIME: 202602020054
ISSUE DATE / TIME: 202602020811
ISSUE DATE / TIME: 202602021148
Unit Type and Rh: 5100
Unit Type and Rh: 5100
Unit Type and Rh: 9500
Unit Type and Rh: 9500

## 2024-06-01 LAB — CBC
HCT: 31.7 % — ABNORMAL LOW (ref 39.0–52.0)
Hemoglobin: 10.1 g/dL — ABNORMAL LOW (ref 13.0–17.0)
MCH: 28.5 pg (ref 26.0–34.0)
MCHC: 31.9 g/dL (ref 30.0–36.0)
MCV: 89.3 fL (ref 80.0–100.0)
Platelets: 89 10*3/uL — ABNORMAL LOW (ref 150–400)
RBC: 3.55 MIL/uL — ABNORMAL LOW (ref 4.22–5.81)
RDW: 15.7 % — ABNORMAL HIGH (ref 11.5–15.5)
WBC: 6.8 10*3/uL (ref 4.0–10.5)
nRBC: 8.6 % — ABNORMAL HIGH (ref 0.0–0.2)

## 2024-06-01 LAB — PROTIME-INR
INR: 1.9 — ABNORMAL HIGH (ref 0.8–1.2)
Prothrombin Time: 23.1 s — ABNORMAL HIGH (ref 11.4–15.2)

## 2024-06-01 LAB — PREPARE FRESH FROZEN PLASMA

## 2024-06-01 LAB — HEPARIN LEVEL (UNFRACTIONATED): Heparin Unfractionated: 0.25 [IU]/mL — ABNORMAL LOW (ref 0.30–0.70)

## 2024-06-01 MED ORDER — HEPARIN (PORCINE) 25000 UT/250ML-% IV SOLN
1350.0000 [IU]/h | INTRAVENOUS | Status: AC
  Administered 2024-06-01: 950 [IU]/h via INTRAVENOUS
  Administered 2024-06-02: 1050 [IU]/h via INTRAVENOUS
  Administered 2024-06-03: 1100 [IU]/h via INTRAVENOUS
  Administered 2024-06-04: 1150 [IU]/h via INTRAVENOUS
  Filled 2024-06-01 (×4): qty 250

## 2024-06-01 MED ORDER — MELATONIN 3 MG PO TABS
3.0000 mg | ORAL_TABLET | Freq: Every evening | ORAL | Status: AC | PRN
  Administered 2024-06-01 – 2024-06-04 (×4): 3 mg via ORAL
  Filled 2024-06-01 (×4): qty 1

## 2024-06-01 MED ORDER — WARFARIN SODIUM 5 MG PO TABS
5.0000 mg | ORAL_TABLET | Freq: Once | ORAL | Status: AC
  Administered 2024-06-01: 5 mg via ORAL
  Filled 2024-06-01: qty 1

## 2024-06-01 MED ORDER — SALINE SPRAY 0.65 % NA SOLN
1.0000 | NASAL | Status: AC | PRN
  Administered 2024-06-01: 1 via NASAL
  Filled 2024-06-01: qty 44

## 2024-06-01 MED ORDER — WARFARIN - PHARMACIST DOSING INPATIENT: Freq: Every day | Status: DC

## 2024-06-01 MED ORDER — WHITE PETROLATUM EX OINT
1.0000 | TOPICAL_OINTMENT | CUTANEOUS | Status: AC | PRN
  Administered 2024-06-01: 1 via TOPICAL
  Filled 2024-06-01 (×2): qty 5

## 2024-06-01 NOTE — Progress Notes (Signed)
 PHARMACY - ANTICOAGULATION CONSULT NOTE  Pharmacy Consult for IV heparin  Indication: mechanical mitral valve replacement   Allergies[1]  Patient Measurements: Height: 5' 10.5 (179.1 cm) Weight: 63.8 kg (140 lb 10.5 oz) IBW/kg (Calculated) : 74.15 HEPARIN  DW (KG): 63.8  Vital Signs: Temp: 98.2 F (36.8 C) (02/03 0410) BP: 137/72 (02/03 0410) Pulse Rate: 87 (02/03 0410)  Labs: Recent Labs    05/31/24 0005 05/31/24 0103 05/31/24 0544 05/31/24 0802 05/31/24 1717 06/01/24 0415  HGB 6.4*  --  5.7*  --   --  10.1*  HCT 20.6*  --  17.5*  --   --  31.7*  PLT 120*  --   --   --   --  89*  LABPROT  --  56.1*  --  29.2*  --  23.1*  INR  --  6.0*  --  2.6*  --  1.9*  CREATININE 0.74  --   --   --  0.78  --     Estimated Creatinine Clearance: 84.2 mL/min (by C-G formula based on SCr of 0.78 mg/dL).   Medical History: Past Medical History:  Diagnosis Date   Acute heart failure with mildly reduced ejection fraction (HFmrEF) (HCC) 05/31/2024   DDD (degenerative disc disease), lumbosacral    Hemolytic anemia 06/29/2014   History of bacterial endocarditis    07/ 2001  enterococcal//  recurrence  11/ 2009  steptococcus viridans   History of chest tube placement    1980's --- R SIDE  SPONTANEOUS  PNEUMOTHORAX   History of Clostridium difficile colitis    2009   History of periodontal disease    Prostate cancer [facility]) urologist--  dr herrick/  oncologist-- dr patrcia   Stage  T1c,  Gleason 3+4,  PSA 3.76,  vol 33.45cc   Pulmonary nodules followed by dr pietro   right upper & lower lobe and left lower lobe--  stable per CT 08-29-2014   S/P redo mitral valve replacement with metallic valve cardiologist--  dr pietro   08-11-2014--- 31 mm Sorin [employer] Optiform bileaflet mechanical prosthesis   Thoracic radiculopathy    T7  (positional)  per PCP note--  pt doing yoga and swimming state has helpted   Wears glasses    Wears partial dentures    upper    Medications:   Medications Prior to Admission  Medication Sig Dispense Refill Last Dose/Taking   aspirin  EC 81 MG EC tablet Take 1 tablet (81 mg total) by mouth daily.   05/30/2024   carvedilol  (COREG ) 3.125 MG tablet TAKE 1 TABLET BY MOUTH TWICE A DAY WITH FOOD 60 tablet 0 05/30/2024   cephALEXin  (KEFLEX ) 500 MG capsule Take 1 capsule (500 mg total) by mouth 2 (two) times daily. 14 capsule 0 05/30/2024   ferrous sulfate  325 (65 FE) MG EC tablet Take 325 mg by mouth daily.   05/29/2024   Multiple Vitamin (MULTIVITAMIN) tablet Take 2 tablets by mouth daily.   05/30/2024   mupirocin ointment (BACTROBAN) 2 % Apply 1 Application topically 4 (four) times daily. Applying to nose   05/30/2024   oxymetazoline  (AFRIN NASAL SPRAY) 0.05 % nasal spray Place 2 sprays into both nostrils 2 (two) times daily as needed for congestion. As needed for nosebleeds (Patient taking differently: Place 2 sprays into both nostrils daily as needed for congestion. As needed for nosebleeds) 30 mL 0 05/30/2024   polyethylene glycol (MIRALAX ) 17 g packet Take 17 g by mouth daily. 14 each 0 05/30/2024   VITAMIN D  PO  Take 1 capsule by mouth daily.   Past Week   warfarin (COUMADIN ) 5 MG tablet TAKE 1/2 TABLET TO 1 TABLET BY MOUTH DAILY OR AS DIRECTED BY ANTICOAGULATION CLINIC. (Patient taking differently: Take 2.5-5 mg by mouth See admin instructions. Take 2.5 mg by mouth once daily on Monday, Wednesday, and Friday. Take 5 mg by mouth once daily on Tuesday, Thursday, Saturday, and Sunday.) 90 tablet 1 05/29/2024    Assessment: 63 yoM s/p mechanical mitral valve replacement maintained on warfarin, presented with epistaxis since mid Dec. Found to be anemic with supratherapeutic INR 02/02. Pharmacy to dose IV heparin  once INR falls below 2.5.   Baseline INR: 6.0 upon admission, 1.9 today Baseline aPTT: not recorded Prior anticoagulation: asa 81 mg daily, warfarin 2.5 mg MWF, 5 mg all other days   Significant events:  Today, 06/01/2024: CBC: Hgb 5.7 on  admission, trending up to >10 today Plts down to <100  Scr stable @ 0.78 No s/sx of bleeding documented  Goal of Therapy: Heparin  level 0.3-0.7 units/ml Monitor platelets by anticoagulation protocol: Yes  Plan: No heparin  bolus Initiate heparin  950 units/hr IV infusion Check heparin  level 6 hrs after start Daily CBC, daily heparin  level once stable Check daily INR  Monitor for signs of bleeding or thrombosis   Shelda Schirmer, Student Pharm-D 06/01/2024,8:51 AM      [1]  Allergies Allergen Reactions   Clarithromycin Nausea Only

## 2024-06-01 NOTE — Telephone Encounter (Signed)
 Copied from CRM #[id number]. Topic: General - Call Back - No Documentation >> Jun 01, 2024  8:18 AM David [name] wrote: Reason for CRM: pt has had paperwork sent fmla and was checking on the status. Reason for fmla is for so pt can keep his job he has been absent at work

## 2024-06-01 NOTE — Progress Notes (Signed)
 PHARMACY - ANTICOAGULATION CONSULT NOTE  Pharmacy Consult for IV heparin  Indication: mechanical mitral valve replacement   Allergies[1]  Patient Measurements: Height: 5' 10.5 (179.1 cm) Weight: 63.8 kg (140 lb 10.5 oz) IBW/kg (Calculated) : 74.15 HEPARIN  DW (KG): 63.8  Vital Signs: Temp: 97.8 F (36.6 C) (02/03 1350) Temp Source: Oral (02/03 1350) BP: 134/72 (02/03 1350) Pulse Rate: 91 (02/03 1350)  Labs: Recent Labs    05/31/24 0005 05/31/24 0103 05/31/24 0544 05/31/24 0802 05/31/24 1717 06/01/24 0415 06/01/24 1650  HGB 6.4*  --  5.7*  --   --  10.1*  --   HCT 20.6*  --  17.5*  --   --  31.7*  --   PLT 120*  --   --   --   --  89*  --   LABPROT  --  56.1*  --  29.2*  --  23.1*  --   INR  --  6.0*  --  2.6*  --  1.9*  --   HEPARINUNFRC  --   --   --   --   --   --  0.25*  CREATININE 0.74  --   --   --  0.78  --   --     Estimated Creatinine Clearance: 84.2 mL/min (by C-G formula based on SCr of 0.78 mg/dL).   Medical History: Past Medical History:  Diagnosis Date   Acute heart failure with mildly reduced ejection fraction (HFmrEF) (HCC) 05/31/2024   DDD (degenerative disc disease), lumbosacral    Hemolytic anemia 06/29/2014   History of bacterial endocarditis    07/ 2001  enterococcal//  recurrence  11/ 2009  steptococcus viridans   History of chest tube placement    1980's --- R SIDE  SPONTANEOUS  PNEUMOTHORAX   History of Clostridium difficile colitis    2009   History of periodontal disease    Prostate cancer [facility]) urologist--  dr herrick/  oncologist-- dr patrcia   Stage  T1c,  Gleason 3+4,  PSA 3.76,  vol 33.45cc   Pulmonary nodules followed by dr pietro   right upper & lower lobe and left lower lobe--  stable per CT 08-29-2014   S/P redo mitral valve replacement with metallic valve cardiologist--  dr pietro   08-11-2014--- 31 mm Sorin [employer] Optiform bileaflet mechanical prosthesis   Thoracic radiculopathy    T7  (positional)  per PCP  note--  pt doing yoga and swimming state has helpted   Wears glasses    Wears partial dentures    upper    Medications:  Medications Prior to Admission  Medication Sig Dispense Refill Last Dose/Taking   aspirin  EC 81 MG EC tablet Take 1 tablet (81 mg total) by mouth daily.   05/30/2024   carvedilol  (COREG ) 3.125 MG tablet TAKE 1 TABLET BY MOUTH TWICE A DAY WITH FOOD 60 tablet 0 05/30/2024   cephALEXin  (KEFLEX ) 500 MG capsule Take 1 capsule (500 mg total) by mouth 2 (two) times daily. 14 capsule 0 05/30/2024   ferrous sulfate  325 (65 FE) MG EC tablet Take 325 mg by mouth daily.   05/29/2024   Multiple Vitamin (MULTIVITAMIN) tablet Take 2 tablets by mouth daily.   05/30/2024   mupirocin ointment (BACTROBAN) 2 % Apply 1 Application topically 4 (four) times daily. Applying to nose   05/30/2024   oxymetazoline  (AFRIN NASAL SPRAY) 0.05 % nasal spray Place 2 sprays into both nostrils 2 (two) times daily as needed for congestion. As needed for  nosebleeds (Patient taking differently: Place 2 sprays into both nostrils daily as needed for congestion. As needed for nosebleeds) 30 mL 0 05/30/2024   polyethylene glycol (MIRALAX ) 17 g packet Take 17 g by mouth daily. 14 each 0 05/30/2024   VITAMIN D  PO Take 1 capsule by mouth daily.   Past Week   warfarin (COUMADIN ) 5 MG tablet TAKE 1/2 TABLET TO 1 TABLET BY MOUTH DAILY OR AS DIRECTED BY ANTICOAGULATION CLINIC. (Patient taking differently: Take 2.5-5 mg by mouth See admin instructions. Take 2.5 mg by mouth once daily on Monday, Wednesday, and Friday. Take 5 mg by mouth once daily on Tuesday, Thursday, Saturday, and Sunday.) 90 tablet 1 05/29/2024    Assessment: 63 yoM s/p mechanical mitral valve replacement maintained on warfarin, presented with epistaxis since mid Dec. Found to be anemic with supratherapeutic INR 02/02. Pharmacy to dose IV heparin  once INR falls below 2.5.   Baseline INR: 6.0 upon admission, 1.9 today Baseline aPTT: not recorded Prior anticoagulation:  asa 81 mg daily, warfarin 2.5 mg MWF, 5 mg all other days   Significant events:  Today, 06/01/2024: CBC: Hgb 5.7 on admission, trending up to >10 today Plts down to <100  Scr stable @ 0.78 16:50 Heparin  level = 0.25, sub-therapeutic with IV heparin  infusing at 950 units/hr No bleeding or complications of therapy per nurse  Goal of Therapy: Heparin  level 0.3-0.7 units/ml Monitor platelets by anticoagulation protocol: Yes  Plan: No heparin  bolus Increase IV heparin  to 1050 units/hr IV infusion 6 hr heparin  level after rate increase Daily CBC, daily heparin  level once stable Check daily INR  Monitor for signs of bleeding or thrombosis   Thank you for allowing pharmacy to be a part of this patients care.  Eleanor EMERSON Agent, PharmD, BCPS Clinical Pharmacist [place] 06/01/2024 7:09 PM        [1]  Allergies Allergen Reactions   Clarithromycin Nausea Only

## 2024-06-01 NOTE — Plan of Care (Signed)

## 2024-06-02 LAB — PROTIME-INR
INR: 1.8 — ABNORMAL HIGH (ref 0.8–1.2)
Prothrombin Time: 21.5 s — ABNORMAL HIGH (ref 11.4–15.2)

## 2024-06-02 LAB — HEPARIN LEVEL (UNFRACTIONATED)
Heparin Unfractionated: 0.35 [IU]/mL (ref 0.30–0.70)
Heparin Unfractionated: 0.31 [IU]/mL (ref 0.30–0.70)

## 2024-06-02 LAB — CBC
HCT: 29.7 % — ABNORMAL LOW (ref 39.0–52.0)
Hemoglobin: 9.8 g/dL — ABNORMAL LOW (ref 13.0–17.0)
MCH: 29.1 pg (ref 26.0–34.0)
MCHC: 33 g/dL (ref 30.0–36.0)
MCV: 88.1 fL (ref 80.0–100.0)
Platelets: 76 10*3/uL — ABNORMAL LOW (ref 150–400)
RBC: 3.37 MIL/uL — ABNORMAL LOW (ref 4.22–5.81)
RDW: 15.6 % — ABNORMAL HIGH (ref 11.5–15.5)
WBC: 6.3 10*3/uL (ref 4.0–10.5)
nRBC: 8.1 % — ABNORMAL HIGH (ref 0.0–0.2)

## 2024-06-02 MED ORDER — WARFARIN SODIUM 5 MG PO TABS
5.0000 mg | ORAL_TABLET | Freq: Once | ORAL | Status: AC
  Administered 2024-06-02: 5 mg via ORAL
  Filled 2024-06-02: qty 1

## 2024-06-02 NOTE — Progress Notes (Signed)
 "        [facility]                                                                               David [name], is a 64 y.o. male, DOB - 1960/06/26, FMW:[id number] Admit date - 05/30/2024    Outpatient Primary MD for the patient is David Lauraine BRAVO, FNP  LOS - 1  days    Brief summary    64 year old man PMH including bacterial endocarditis, status post mechanical mitral valve replacement maintained on warfarin, presented with chronic epistaxis from nares since mid December. Found to be markedly anemic with supratherapeutic INR. INR treated with FFP and vitamin K , Rhino Rocket placed left nare, EDP spoke with ENT who recommended outpatient follow-up for epistaxis and hemotympanum.    Assessment & Plan    Assessment and Plan:  Severe symptomatic anemia sec to sub acute blood loss Intermittent epistaxis Bilateral hemotympanum. In the setting of chronic long-term anticoagulation and supratherapeutic INR Acute blood loss anemia from the above Patient underwent Rhino Rocket placement, will need to be removed in 3 to 4 days and recommend outpatient follow-up with ENT Hemoglobin dropped to 5.7 on admission and he was given FFP and vitamin K  in the ED and  3 units of PRBC transfusion. Currently hemoglobin around 9.8 and stable Some oozing from the [employer] as per the RN.  Continue to monitor   History of mechanical mitral valve with chronic long-term anticoagulation, supratherapeutic INR on admission. Goal INR is between 2.5-3.5 Started the patient on IV heparin  Continue with Coumadin  Monitor INR daily    Recent thrombocytopenia Monitor   She have dental caries Complete the course of Augmentin        Estimated body mass index is 19.9 kg/m as calculated from the following:   Height as of this encounter: 5' 10.5 (1.791 m).   Weight as of this encounter: 63.8 kg.  Code Status: full code.  DVT Prophylaxis:  SCDs Start: 05/31/24 0558 warfarin (COUMADIN ) tablet 5  mg   Level of Care: Level of care: Progressive Family Communication: Updated patient  Disposition Plan:     Remains inpatient appropriate:  pending.   Procedures:  None.   Consultants:   None   Antimicrobials:   Anti-infectives (From admission, onward)    Start     Dose/Rate Route Frequency Ordered Stop   05/31/24 1800  amoxicillin -clavulanate (AUGMENTIN ) 875-125 MG per tablet 1 tablet        1 tablet Oral Every 12 hours 05/31/24 1647     05/31/24 1200  Ampicillin -Sulbactam (UNASYN ) 3 g in sodium chloride  0.9 % 100 mL IVPB  Status:  Discontinued        3 g 200 mL/hr over 30 Minutes Intravenous Every 6 hours 05/31/24 0605 05/31/24 1647   05/31/24 0430  ampicillin -sulbactam (UNASYN ) 1.5 g in sodium chloride  0.9 % 100 mL IVPB        1.5 g 200 mL/hr over 30 Minutes Intravenous  Once 05/31/24 0428 05/31/24 0605        Medications  Scheduled Meds:  amoxicillin -clavulanate  1 tablet Oral Q12H   carvedilol   3.125 mg Oral BID WC   warfarin  5 mg Oral ONCE-1600   Warfarin - Pharmacist Dosing Inpatient   Does not apply q1600   Continuous Infusions:  heparin  1,100 Units/hr (06/02/24 1140)   PRN Meds:.acetaminophen  **OR** acetaminophen , melatonin, sodium chloride , white petrolatum     Subjective:   Roxas Clymer was seen and examined today.  Some oozing from the rhino racket as per the RN.  Objective:   Vitals:   06/01/24 2138 06/02/24 0532 06/02/24 0855 06/02/24 1307  BP: 119/69 121/61 111/64 (!) 122/57  Pulse: 84 89 83 85  Resp: 20 18  17   Temp: 99.3 F (37.4 C) 98.2 F (36.8 C)  99.8 F (37.7 C)  TempSrc: Oral Oral  Oral  SpO2: 100% 100%  100%  Weight:      Height:        Intake/Output Summary (Last 24 hours) at 06/02/2024 1617 Last data filed at 06/01/2024 1756 Gross per 24 hour  Intake 79.22 ml  Output --  Net 79.22 ml   Filed Weights   05/31/24 0840  Weight: 63.8 kg     Exam General: Alert and oriented x 3, NAD Cardiovascular: S1 S2 auscultated, no  murmurs, RRR Respiratory: Clear to auscultation bilaterally, no wheezing, rales or rhonchi Gastrointestinal: Soft, nontender, nondistended, + bowel sounds Ext: no pedal edema bilaterally Neuro: AAOx3, Cr N's II- XII.   Data Reviewed:  I have personally reviewed following labs and imaging studies   CBC Lab Results  Component Value Date   WBC 6.3 06/02/2024   RBC 3.37 (L) 06/02/2024   HGB 9.8 (L) 06/02/2024   HCT 29.7 (L) 06/02/2024   MCV 88.1 06/02/2024   MCH 29.1 06/02/2024   PLT 76 (L) 06/02/2024   MCHC 33.0 06/02/2024   RDW 15.6 (H) 06/02/2024   LYMPHSABS 1.5 05/31/2024   MONOABS 0.3 05/31/2024   EOSABS 0.1 05/31/2024   BASOSABS 0.1 05/31/2024     Last metabolic panel Lab Results  Component Value Date   NA 139 05/31/2024   K 4.0 05/31/2024   CL 103 05/31/2024   CO2 24 05/31/2024   BUN 18 05/31/2024   CREATININE 0.78 05/31/2024   GLUCOSE 104 (H) 05/31/2024   GFRNONAA >60 05/31/2024   GFRAA >60 09/09/2017   CALCIUM  9.2 05/31/2024   PROT 6.8 05/10/2024   ALBUMIN  4.4 05/10/2024   LABGLOB 2.4 04/26/2024   BILITOT 0.3 05/10/2024   ALKPHOS 752 (H) 05/10/2024   AST 35 05/10/2024   ALT 17 05/10/2024   ANIONGAP 12 05/31/2024    CBG (last 3)  No results for input(s): GLUCAP in the last 72 hours.    Coagulation Profile: Recent Labs  Lab 05/31/24 0103 05/31/24 0802 06/01/24 0415 06/02/24 0243  INR 6.0* 2.6* 1.9* 1.8*     Radiology Studies: No results found.     Elgie Butter M.D. [facility] 06/02/2024, 4:17 PM  Available via Epic secure chat 7am-7pm After 7 pm, please refer to night coverage provider listed on amion.    "

## 2024-06-02 NOTE — Progress Notes (Signed)
 PHARMACY - ANTICOAGULATION CONSULT NOTE  Pharmacy Consult for IV heparin  Indication: mechanical mitral valve replacement   Allergies[1]  Patient Measurements: Height: 5' 10.5 (179.1 cm) Weight: 63.8 kg (140 lb 10.5 oz) IBW/kg (Calculated) : 74.15 HEPARIN  DW (KG): 63.8  Vital Signs: Temp: 98.2 F (36.8 C) (02/04 0532) Temp Source: Oral (02/04 0532) BP: 111/64 (02/04 0855) Pulse Rate: 83 (02/04 0855)  Labs: Recent Labs    05/31/24 0005 05/31/24 0103 05/31/24 0544 05/31/24 0802 05/31/24 1717 06/01/24 0415 06/01/24 1650 06/02/24 0243 06/02/24 0939  HGB 6.4*  --  5.7*  --   --  10.1*  --  9.8*  --   HCT 20.6*  --  17.5*  --   --  31.7*  --  29.7*  --   PLT 120*  --   --   --   --  89*  --  76*  --   LABPROT  --    < >  --  29.2*  --  23.1*  --  21.5*  --   INR  --    < >  --  2.6*  --  1.9*  --  1.8*  --   HEPARINUNFRC  --   --   --   --   --   --  0.25* 0.35 0.31  CREATININE 0.74  --   --   --  0.78  --   --   --   --    < > = values in this interval not displayed.    Estimated Creatinine Clearance: 84.2 mL/min (by C-G formula based on SCr of 0.78 mg/dL).   Medical History: Past Medical History:  Diagnosis Date   Acute heart failure with mildly reduced ejection fraction (HFmrEF) (HCC) 05/31/2024   DDD (degenerative disc disease), lumbosacral    Hemolytic anemia 06/29/2014   History of bacterial endocarditis    07/ 2001  enterococcal//  recurrence  11/ 2009  steptococcus viridans   History of chest tube placement    1980's --- R SIDE  SPONTANEOUS  PNEUMOTHORAX   History of Clostridium difficile colitis    2009   History of periodontal disease    Prostate cancer [facility]) urologist--  dr herrick/  oncologist-- dr patrcia   Stage  T1c,  Gleason 3+4,  PSA 3.76,  vol 33.45cc   Pulmonary nodules followed by dr pietro   right upper & lower lobe and left lower lobe--  stable per CT 08-29-2014   S/P redo mitral valve replacement with metallic valve cardiologist--  dr  pietro   08-11-2014--- 31 mm Sorin [employer] Optiform bileaflet mechanical prosthesis   Thoracic radiculopathy    T7  (positional)  per PCP note--  pt doing yoga and swimming state has helpted   Wears glasses    Wears partial dentures    upper    Medications:  Medications Prior to Admission  Medication Sig Dispense Refill Last Dose/Taking   aspirin  EC 81 MG EC tablet Take 1 tablet (81 mg total) by mouth daily.   05/30/2024   carvedilol  (COREG ) 3.125 MG tablet TAKE 1 TABLET BY MOUTH TWICE A DAY WITH FOOD 60 tablet 0 05/30/2024   cephALEXin  (KEFLEX ) 500 MG capsule Take 1 capsule (500 mg total) by mouth 2 (two) times daily. 14 capsule 0 05/30/2024   ferrous sulfate  325 (65 FE) MG EC tablet Take 325 mg by mouth daily.   05/29/2024   Multiple Vitamin (MULTIVITAMIN) tablet Take 2 tablets by mouth daily.  05/30/2024   mupirocin ointment (BACTROBAN) 2 % Apply 1 Application topically 4 (four) times daily. Applying to nose   05/30/2024   oxymetazoline  (AFRIN NASAL SPRAY) 0.05 % nasal spray Place 2 sprays into both nostrils 2 (two) times daily as needed for congestion. As needed for nosebleeds (Patient taking differently: Place 2 sprays into both nostrils daily as needed for congestion. As needed for nosebleeds) 30 mL 0 05/30/2024   polyethylene glycol (MIRALAX ) 17 g packet Take 17 g by mouth daily. 14 each 0 05/30/2024   VITAMIN D  PO Take 1 capsule by mouth daily.   Past Week   warfarin (COUMADIN ) 5 MG tablet TAKE 1/2 TABLET TO 1 TABLET BY MOUTH DAILY OR AS DIRECTED BY ANTICOAGULATION CLINIC. (Patient taking differently: Take 2.5-5 mg by mouth See admin instructions. Take 2.5 mg by mouth once daily on Monday, Wednesday, and Friday. Take 5 mg by mouth once daily on Tuesday, Thursday, Saturday, and Sunday.) 90 tablet 1 05/29/2024    Assessment: 63 yoM s/p mechanical mitral valve replacement maintained on warfarin, presented with epistaxis since mid Dec. Found to be anemic with supratherapeutic INR 02/02. Pharmacy  to dose IV heparin  once INR falls below 2.5.   Baseline INR: 6.0 upon admission, 1.8 today Baseline aPTT: not recorded Prior anticoagulation: asa 81 mg daily, warfarin 2.5 mg MWF, 5 mg all other days   Significant events: Warfarin resumed 06/01/2024  Today, 06/02/2024: Heparin  level 0.31, borderline therapeutic on 1050 units/hr INR 1.8 Hgb 9.8, plts 76 No bleeding noted per RN PO intake: eating > 50% of meals DDI: none major, but broad spectrum abx can increase warfarin sensitivity   Goal of Therapy: Goal INR 2.5-3.5 Heparin  level 0.3-0.7 units/ml Monitor platelets by anticoagulation protocol: Yes  Plan: 5 mg warfarin x 1 today Increase IV heparin  to 1100 units/hr IV infusion to maintain heparin  level Daily heparin  level Daily CBC Check daily INR  Monitor for signs of bleeding or thrombosis   Thank you for allowing pharmacy to be a part of this patients care.  Laithan Conchas, Student-PharmD 06/02/2024, 11:55 AM          [1]  Allergies Allergen Reactions   Clarithromycin Nausea Only

## 2024-06-02 NOTE — Progress Notes (Addendum)
 PHARMACY - ANTICOAGULATION CONSULT NOTE  Pharmacy Consult for IV heparin  Indication: mechanical mitral valve replacement   Allergies[1]  Patient Measurements: Height: 5' 10.5 (179.1 cm) Weight: 63.8 kg (140 lb 10.5 oz) IBW/kg (Calculated) : 74.15 HEPARIN  DW (KG): 63.8  Vital Signs: Temp: 99.3 F (37.4 C) (02/03 2138) Temp Source: Oral (02/03 2138) BP: 119/69 (02/03 2138) Pulse Rate: 84 (02/03 2138)  Labs: Recent Labs    05/31/24 0005 05/31/24 0103 05/31/24 0544 05/31/24 0802 05/31/24 1717 06/01/24 0415 06/01/24 1650 06/02/24 0243  HGB 6.4*  --  5.7*  --   --  10.1*  --  9.8*  HCT 20.6*  --  17.5*  --   --  31.7*  --  29.7*  PLT 120*  --   --   --   --  89*  --  76*  LABPROT  --    < >  --  29.2*  --  23.1*  --  21.5*  INR  --    < >  --  2.6*  --  1.9*  --  1.8*  HEPARINUNFRC  --   --   --   --   --   --  0.25* 0.35  CREATININE 0.74  --   --   --  0.78  --   --   --    < > = values in this interval not displayed.    Estimated Creatinine Clearance: 84.2 mL/min (by C-G formula based on SCr of 0.78 mg/dL).   Medical History: Past Medical History:  Diagnosis Date   Acute heart failure with mildly reduced ejection fraction (HFmrEF) (HCC) 05/31/2024   DDD (degenerative disc disease), lumbosacral    Hemolytic anemia 06/29/2014   History of bacterial endocarditis    07/ 2001  enterococcal//  recurrence  11/ 2009  steptococcus viridans   History of chest tube placement    1980's --- R SIDE  SPONTANEOUS  PNEUMOTHORAX   History of Clostridium difficile colitis    2009   History of periodontal disease    Prostate cancer [facility]) urologist--  dr herrick/  oncologist-- dr patrcia   Stage  T1c,  Gleason 3+4,  PSA 3.76,  vol 33.45cc   Pulmonary nodules followed by dr pietro   right upper & lower lobe and left lower lobe--  stable per CT 08-29-2014   S/P redo mitral valve replacement with metallic valve cardiologist--  dr pietro   08-11-2014--- 31 mm Sorin [employer]  Optiform bileaflet mechanical prosthesis   Thoracic radiculopathy    T7  (positional)  per PCP note--  pt doing yoga and swimming state has helpted   Wears glasses    Wears partial dentures    upper    Medications:  Medications Prior to Admission  Medication Sig Dispense Refill Last Dose/Taking   aspirin  EC 81 MG EC tablet Take 1 tablet (81 mg total) by mouth daily.   05/30/2024   carvedilol  (COREG ) 3.125 MG tablet TAKE 1 TABLET BY MOUTH TWICE A DAY WITH FOOD 60 tablet 0 05/30/2024   cephALEXin  (KEFLEX ) 500 MG capsule Take 1 capsule (500 mg total) by mouth 2 (two) times daily. 14 capsule 0 05/30/2024   ferrous sulfate  325 (65 FE) MG EC tablet Take 325 mg by mouth daily.   05/29/2024   Multiple Vitamin (MULTIVITAMIN) tablet Take 2 tablets by mouth daily.   05/30/2024   mupirocin ointment (BACTROBAN) 2 % Apply 1 Application topically 4 (four) times daily. Applying to nose  05/30/2024   oxymetazoline  (AFRIN NASAL SPRAY) 0.05 % nasal spray Place 2 sprays into both nostrils 2 (two) times daily as needed for congestion. As needed for nosebleeds (Patient taking differently: Place 2 sprays into both nostrils daily as needed for congestion. As needed for nosebleeds) 30 mL 0 05/30/2024   polyethylene glycol (MIRALAX ) 17 g packet Take 17 g by mouth daily. 14 each 0 05/30/2024   VITAMIN D  PO Take 1 capsule by mouth daily.   Past Week   warfarin (COUMADIN ) 5 MG tablet TAKE 1/2 TABLET TO 1 TABLET BY MOUTH DAILY OR AS DIRECTED BY ANTICOAGULATION CLINIC. (Patient taking differently: Take 2.5-5 mg by mouth See admin instructions. Take 2.5 mg by mouth once daily on Monday, Wednesday, and Friday. Take 5 mg by mouth once daily on Tuesday, Thursday, Saturday, and Sunday.) 90 tablet 1 05/29/2024    Assessment: 63 yoM s/p mechanical mitral valve replacement maintained on warfarin, presented with epistaxis since mid Dec. Found to be anemic with supratherapeutic INR 02/02. Pharmacy to dose IV heparin  once INR falls below 2.5.    Baseline INR: 6.0 upon admission, 1.9 today Baseline aPTT: not recorded Prior anticoagulation: asa 81 mg daily, warfarin 2.5 mg MWF, 5 mg all other days   Significant events:  Today, 06/02/2024: Heparin  level 0.35 therapeutic on 1050 units/hr INR 1.8 Hgb 9.8, plts 76 No bleeding noted per RN  Goal of Therapy: Heparin  level 0.3-0.7 units/ml Monitor platelets by anticoagulation protocol: Yes  Plan: No heparin  bolus continue IV heparin  at 1050 units/hr IV infusion Confirmatory heparin  level in 6 hours Daily CBC, daily heparin  level once stable Check daily INR  Monitor for signs of bleeding or thrombosis   Thank you for allowing pharmacy to be a part of this patients care.  Leeroy Mace RPh 06/02/2024, 3:42 AM         [1]  Allergies Allergen Reactions   Clarithromycin Nausea Only

## 2024-06-03 LAB — CBC
HCT: 28.1 % — ABNORMAL LOW (ref 39.0–52.0)
Hemoglobin: 9.2 g/dL — ABNORMAL LOW (ref 13.0–17.0)
MCH: 28.9 pg (ref 26.0–34.0)
MCHC: 32.7 g/dL (ref 30.0–36.0)
MCV: 88.4 fL (ref 80.0–100.0)
Platelets: 70 10*3/uL — ABNORMAL LOW (ref 150–400)
RBC: 3.18 MIL/uL — ABNORMAL LOW (ref 4.22–5.81)
RDW: 15.4 % (ref 11.5–15.5)
WBC: 5.2 10*3/uL (ref 4.0–10.5)
nRBC: 7.7 % — ABNORMAL HIGH (ref 0.0–0.2)

## 2024-06-03 LAB — IRON AND TIBC
Iron: 31 ug/dL — ABNORMAL LOW (ref 45–182)
Saturation Ratios: 11 % — ABNORMAL LOW (ref 17.9–39.5)
TIBC: 272 ug/dL (ref 250–450)
UIBC: 241 ug/dL

## 2024-06-03 LAB — VITAMIN B12: Vitamin B-12: 659 pg/mL (ref 180–914)

## 2024-06-03 LAB — PROTIME-INR
INR: 2.1 — ABNORMAL HIGH (ref 0.8–1.2)
Prothrombin Time: 24.6 s — ABNORMAL HIGH (ref 11.4–15.2)

## 2024-06-03 LAB — FERRITIN: Ferritin: 814 ng/mL — ABNORMAL HIGH (ref 24–336)

## 2024-06-03 LAB — RETICULOCYTES
Immature Retic Fract: 35 % — ABNORMAL HIGH (ref 2.3–15.9)
RBC.: 3.1 MIL/uL — ABNORMAL LOW (ref 4.22–5.81)
Retic Count, Absolute: 65.1 10*3/uL (ref 19.0–186.0)
Retic Ct Pct: 2.1 % (ref 0.4–3.1)

## 2024-06-03 LAB — HEPARIN LEVEL (UNFRACTIONATED): Heparin Unfractionated: 0.3 [IU]/mL (ref 0.30–0.70)

## 2024-06-03 LAB — FOLATE: Folate: 12.9 ng/mL

## 2024-06-03 MED ORDER — WARFARIN SODIUM 5 MG PO TABS: 5.0000 mg | ORAL_TABLET | Freq: Once | ORAL | Status: DC

## 2024-06-03 MED ORDER — ENSURE PLUS HIGH PROTEIN PO LIQD
237.0000 mL | Freq: Two times a day (BID) | ORAL | Status: AC
  Administered 2024-06-03 – 2024-06-04 (×2): 237 mL via ORAL

## 2024-06-03 NOTE — Progress Notes (Signed)
 PHARMACY - ANTICOAGULATION CONSULT NOTE  Pharmacy Consult for IV heparin /warfarin Indication: mechanical mitral valve replacement   Allergies[1]  Patient Measurements: Height: 5' 10.5 (179.1 cm) Weight: 63.8 kg (140 lb 10.5 oz) IBW/kg (Calculated) : 74.15 HEPARIN  DW (KG): 63.8  Vital Signs: Temp: 97.9 F (36.6 C) (02/05 0439) Temp Source: Oral (02/05 0439) BP: 126/75 (02/05 0439) Pulse Rate: 84 (02/05 0439)  Labs: Recent Labs     0000 05/31/24 1717 06/01/24 0415 06/01/24 1650 06/02/24 0243 06/02/24 0939 06/03/24 0417  HGB   < >  --  10.1*  --  9.8*  --  9.2*  HCT  --   --  31.7*  --  29.7*  --  28.1*  PLT  --   --  89*  --  76*  --  70*  LABPROT  --   --  23.1*  --  21.5*  --  24.6*  INR  --   --  1.9*  --  1.8*  --  2.1*  HEPARINUNFRC  --   --   --    < > 0.35 0.31 0.30  CREATININE  --  0.78  --   --   --   --   --    < > = values in this interval not displayed.    Estimated Creatinine Clearance: 84.2 mL/min (by C-G formula based on SCr of 0.78 mg/dL).   Medical History: Past Medical History:  Diagnosis Date   Acute heart failure with mildly reduced ejection fraction (HFmrEF) (HCC) 05/31/2024   DDD (degenerative disc disease), lumbosacral    Hemolytic anemia 06/29/2014   History of bacterial endocarditis    07/ 2001  enterococcal//  recurrence  11/ 2009  steptococcus viridans   History of chest tube placement    1980's --- R SIDE  SPONTANEOUS  PNEUMOTHORAX   History of Clostridium difficile colitis    2009   History of periodontal disease    Prostate cancer [facility]) urologist--  dr herrick/  oncologist-- dr patrcia   Stage  T1c,  Gleason 3+4,  PSA 3.76,  vol 33.45cc   Pulmonary nodules followed by dr pietro   right upper & lower lobe and left lower lobe--  stable per CT 08-29-2014   S/P redo mitral valve replacement with metallic valve cardiologist--  dr pietro   08-11-2014--- 31 mm Sorin [employer] Optiform bileaflet mechanical prosthesis   Thoracic  radiculopathy    T7  (positional)  per PCP note--  pt doing yoga and swimming state has helpted   Wears glasses    Wears partial dentures    upper    Medications:  Medications Prior to Admission  Medication Sig Dispense Refill Last Dose/Taking   aspirin  EC 81 MG EC tablet Take 1 tablet (81 mg total) by mouth daily.   05/30/2024   carvedilol  (COREG ) 3.125 MG tablet TAKE 1 TABLET BY MOUTH TWICE A DAY WITH FOOD 60 tablet 0 05/30/2024   cephALEXin  (KEFLEX ) 500 MG capsule Take 1 capsule (500 mg total) by mouth 2 (two) times daily. 14 capsule 0 05/30/2024   ferrous sulfate  325 (65 FE) MG EC tablet Take 325 mg by mouth daily.   05/29/2024   Multiple Vitamin (MULTIVITAMIN) tablet Take 2 tablets by mouth daily.   05/30/2024   mupirocin ointment (BACTROBAN) 2 % Apply 1 Application topically 4 (four) times daily. Applying to nose   05/30/2024   oxymetazoline  (AFRIN NASAL SPRAY) 0.05 % nasal spray Place 2 sprays into both nostrils 2 (two)  times daily as needed for congestion. As needed for nosebleeds (Patient taking differently: Place 2 sprays into both nostrils daily as needed for congestion. As needed for nosebleeds) 30 mL 0 05/30/2024   polyethylene glycol (MIRALAX ) 17 g packet Take 17 g by mouth daily. 14 each 0 05/30/2024   VITAMIN D  PO Take 1 capsule by mouth daily.   Past Week   warfarin (COUMADIN ) 5 MG tablet TAKE 1/2 TABLET TO 1 TABLET BY MOUTH DAILY OR AS DIRECTED BY ANTICOAGULATION CLINIC. (Patient taking differently: Take 2.5-5 mg by mouth See admin instructions. Take 2.5 mg by mouth once daily on Monday, Wednesday, and Friday. Take 5 mg by mouth once daily on Tuesday, Thursday, Saturday, and Sunday.) 90 tablet 1 05/29/2024    Assessment: 63 yoM s/p mechanical mitral valve replacement maintained on warfarin, presented with epistaxis since mid Dec. Found to be anemic with supratherapeutic INR 02/02. Pharmacy to dose IV heparin  once INR falls below 2.5.   Baseline INR: 6.0 upon admission Baseline aPTT: not  recorded Prior anticoagulation: asa 81 mg daily, warfarin 2.5 mg MWF, 5 mg all other days   Significant events: Warfarin resumed 06/01/2024  Today, 06/03/2024: Heparin  level 0.30, borderline therapeutic on 1100 units/hr INR 2.1 --> below goal of 2.5, trending up  Hgb 9.2, plts 70 -> both trending down Some bleeding noted per RN around [employer] PO intake poor for past 1 day per RN DDI: none major, but broad spectrum abx can increase warfarin sensitivity   Goal of Therapy: Goal INR 2.5-3.5 Heparin  level 0.3-0.7 units/ml Monitor platelets by anticoagulation protocol: Yes  Plan: 5 mg warfarin x 1 today Increase IV heparin  to 1150 units/hr IV infusion to maintain heparin  level Daily heparin  level Daily CBC Check daily INR  Monitor for signs of bleeding or thrombosis  Thank you for allowing pharmacy to be a part of this patients care.  Hisao Doo, Student-PharmD 06/03/2024, 10:15 AM           [1]  Allergies Allergen Reactions   Clarithromycin Nausea Only

## 2024-06-03 NOTE — Progress Notes (Signed)
 "        [facility]                                                                               David [name], is a 64 y.o. male, DOB - 1961-04-16, FMW:[id number] Admit date - 05/30/2024    Outpatient Primary MD for the patient is David Lauraine BRAVO, FNP  LOS - 2  days    Brief summary    64 year old man PMH including bacterial endocarditis, status post mechanical mitral valve replacement maintained on warfarin, presented with chronic epistaxis from nares since mid December. Found to be markedly anemic with supratherapeutic INR. INR treated with FFP and vitamin K , Rhino Rocket placed left nare, EDP spoke with ENT who recommended outpatient follow-up for epistaxis and hemotympanum.    Assessment & Plan    Assessment and Plan:  Severe symptomatic anemia sec to sub acute blood loss Intermittent epistaxis Bilateral hemotympanum. In the setting of chronic long-term anticoagulation and supratherapeutic INR Acute blood loss anemia from the above Patient underwent Rhino Rocket placement, will need to be removed in 3 to 4 days and recommend outpatient follow-up with ENT Hemoglobin dropped to 5.7 on admission and he was given FFP and vitamin K  in the ED and  3 units of PRBC transfusion. Hemoglobin improved to 10.2 to 9.8 and continues to drop to 9.2 with continuous oozing from the [employer] as per the RN.   Patient was started on heparin  and coumadin  on 2/3 . With the drop in hemoglobin to 9.2 , I've stopped the coumadin .  Requested hematology consult spoke to Dr Gatha to see if the IV heparin  needs to be changed to bivalirudin or argotroban as his platelets continue to drop.  ENT consulted.  Hematology deferred the consult, suggested getting cardiology input regarding anti coagulation.     History of mechanical mitral valve with chronic long-term anticoagulation, supratherapeutic INR on admission. Goal INR is between 2.5-3.5 Started the patient on IV heparin  Monitor INR  daily    Recent thrombocytopenia Possibly from consumption vs HIT Check HIT.     Dental caries Complete the course of Augmentin     Estimated body mass index is 19.9 kg/m as calculated from the following:   Height as of this encounter: 5' 10.5 (1.791 m).   Weight as of this encounter: 63.8 kg.  Code Status: full code.  DVT Prophylaxis:  SCDs Start: 05/31/24 0558   Level of Care: Level of care: Progressive Family Communication: Updated patient  Disposition Plan:     Remains inpatient appropriate:  pending.   Procedures:  None.   Consultants:   Hematology oncology Cardiology ENT   Antimicrobials:   Anti-infectives (From admission, onward)    Start     Dose/Rate Route Frequency Ordered Stop   05/31/24 1800  amoxicillin -clavulanate (AUGMENTIN ) 875-125 MG per tablet 1 tablet        1 tablet Oral Every 12 hours 05/31/24 1647     05/31/24 1200  Ampicillin -Sulbactam (UNASYN ) 3 g in sodium chloride  0.9 % 100 mL IVPB  Status:  Discontinued        3 g 200 mL/hr over 30 Minutes Intravenous Every 6 hours 05/31/24 0605  05/31/24 1647   05/31/24 0430  ampicillin -sulbactam (UNASYN ) 1.5 g in sodium chloride  0.9 % 100 mL IVPB        1.5 g 200 mL/hr over 30 Minutes Intravenous  Once 05/31/24 0428 05/31/24 9394        Medications  Scheduled Meds:  amoxicillin -clavulanate  1 tablet Oral Q12H   carvedilol   3.125 mg Oral BID WC   feeding supplement  237 mL Oral BID BM   Warfarin - Pharmacist Dosing Inpatient   Does not apply q1600   Continuous Infusions:  heparin  1,150 Units/hr (06/03/24 1142)   PRN Meds:.acetaminophen  **OR** acetaminophen , melatonin, sodium chloride , white petrolatum     Subjective:   David [name] was seen and examined today.  Some oozing from the rhino racket as per the RN.   Objective:   Vitals:   06/02/24 1307 06/02/24 2001 06/03/24 0439 06/03/24 1326  BP: (!) 122/57 118/63 126/75 127/78  Pulse: 85 84 84 84  Resp: 17 17 16 17   Temp: 99.8 F  (37.7 C) 98.6 F (37 C) 97.9 F (36.6 C) 98.5 F (36.9 C)  TempSrc: Oral  Oral   SpO2: 100% 99% 100%   Weight:      Height:        Intake/Output Summary (Last 24 hours) at 06/03/2024 1555 Last data filed at 06/03/2024 1400 Gross per 24 hour  Intake 474.54 ml  Output 325 ml  Net 149.54 ml   Filed Weights   05/31/24 0840  Weight: 63.8 kg     Exam  General exam: Appears calm and comfortable  Respiratory system: Clear to auscultation. Respiratory effort normal. Cardiovascular system: S1 & S2 heard, RRR. No JVD, Gastrointestinal system: Abdomen is nondistended, soft and nontender.  Central nervous system: Alert and oriented.  Extremities: Symmetric 5 x 5 power. Skin: No rashes, Psychiatry: Mood & affect appropriate.    Data Reviewed:  I have personally reviewed following labs and imaging studies   CBC Lab Results  Component Value Date   WBC 5.2 06/03/2024   RBC 3.18 (L) 06/03/2024   HGB 9.2 (L) 06/03/2024   HCT 28.1 (L) 06/03/2024   MCV 88.4 06/03/2024   MCH 28.9 06/03/2024   PLT 70 (L) 06/03/2024   MCHC 32.7 06/03/2024   RDW 15.4 06/03/2024   LYMPHSABS 1.5 05/31/2024   MONOABS 0.3 05/31/2024   EOSABS 0.1 05/31/2024   BASOSABS 0.1 05/31/2024     Last metabolic panel Lab Results  Component Value Date   NA 139 05/31/2024   K 4.0 05/31/2024   CL 103 05/31/2024   CO2 24 05/31/2024   BUN 18 05/31/2024   CREATININE 0.78 05/31/2024   GLUCOSE 104 (H) 05/31/2024   GFRNONAA >60 05/31/2024   GFRAA >60 09/09/2017   CALCIUM  9.2 05/31/2024   PROT 6.8 05/10/2024   ALBUMIN  4.4 05/10/2024   LABGLOB 2.4 04/26/2024   BILITOT 0.3 05/10/2024   ALKPHOS 752 (H) 05/10/2024   AST 35 05/10/2024   ALT 17 05/10/2024   ANIONGAP 12 05/31/2024    CBG (last 3)  No results for input(s): GLUCAP in the last 72 hours.    Coagulation Profile: Recent Labs  Lab 05/31/24 0103 05/31/24 0802 06/01/24 0415 06/02/24 0243 06/03/24 0417  INR 6.0* 2.6* 1.9* 1.8* 2.1*      Radiology Studies: No results found.     Elgie Butter M.D. [facility] 06/03/2024, 3:55 PM  Available via Epic secure chat 7am-7pm After 7 pm, please refer to night coverage provider listed on amion.    "

## 2024-06-03 NOTE — Plan of Care (Signed)

## 2024-06-03 NOTE — Plan of Care (Signed)

## 2024-06-03 NOTE — Progress Notes (Signed)
" °   06/03/24 1622  TOC Brief Assessment  Insurance and Status Reviewed  Patient has primary care physician Yes  Home environment has been reviewed home with spouse  Prior level of function: independent  Prior/Current Home Services No current home services  Social Drivers of Health Review SDOH reviewed no interventions necessary  Readmission risk has been reviewed Yes  Transition of care needs no transition of care needs at this time    "

## 2024-06-03 NOTE — Consult Note (Signed)
 "  Cardiology Consultation   Patient ID: David [name] MRN: [id number]; DOB: 11-19-1960  Admit date: 05/30/2024 Date of Consult: 06/03/2024  PCP:  [name], FNP   [facility] Providers Cardiologist:  Redell Shallow, MD        Patient Profile: David [name] is a 64 y.o. male with a hx of Infective Endocarditis s/p MV repair (2009) c/b Recurrent IE s/p mech MVR (2016) on Warfarin, HFmrEF, HLD and recurrent epistaxis who is being seen 06/03/2024 for the evaluation of supratherapeutic INR with recurrent bleeding at the request of Dr. Cherlyn.  History of Present Illness: David [name] reports that since December of last year he has had ongoing issues with recurrent epistaxis.  He has had several instances of supratherapeutic INRs despite reported dietary adherence.  He was first seen by ENT on 05/07/2024 for significant epistaxis and he underwent cauterization at that time.  Presented back to the ED on 05/13/2024 and had nasal packing.  He has followed with ENT since that time; however, has had recurrent epistaxis.  The patient was admitted to the hospital on 05/31/2024 for severe anemia in the setting of recurrent epistaxis.  Hemoglobin was 5.7 requiring multiple blood transfusions.  The patient's INR on admission was 6.  The patient was given IV vitamin K  and FFP.  He is nose was packed by ENT for hemostasis, but he does continue to have some nasal discharge.  Hemoglobins have remained steady.  He is currently being bridged with a heparin  drip.  Given the persistence of his epistaxis, cardiology is consulted for evaluation.  Today the patient states that he feels well apart from having some slight ongoing nasal drippage.  He denies chest pain, SOB, PND, orthopnea, swelling.  Takes a baby aspirin  and warfarin as an outpatient.   Past Medical History:  Diagnosis Date   Acute heart failure with mildly reduced ejection fraction (HFmrEF) (HCC) 05/31/2024   DDD (degenerative disc disease),  lumbosacral    Hemolytic anemia 06/29/2014   History of bacterial endocarditis    07/ 2001  enterococcal//  recurrence  11/ 2009  steptococcus viridans   History of chest tube placement    1980's --- R SIDE  SPONTANEOUS  PNEUMOTHORAX   History of Clostridium difficile colitis    2009   History of periodontal disease    Prostate cancer [facility]) urologist--  dr herrick/  oncologist-- dr patrcia   Stage  T1c,  Gleason 3+4,  PSA 3.76,  vol 33.45cc   Pulmonary nodules followed by dr shallow   right upper & lower lobe and left lower lobe--  stable per CT 08-29-2014   S/P redo mitral valve replacement with metallic valve cardiologist--  dr shallow   08-11-2014--- 31 mm Sorin Carbomedics Optiform bileaflet mechanical prosthesis   Thoracic radiculopathy    T7  (positional)  per PCP note--  pt doing yoga and swimming state has helpted   Wears glasses    Wears partial dentures    upper    Past Surgical History:  Procedure Laterality Date   CARDIAC CATHETERIZATION  03-15-2008  dr morris   no sig. coronary disease/  normal LVF/  moderate pulmonary hypertension w/ elevated V wave   COLONOSCOPY  last one 12/ 2013   ESOPHAGOGASTRODUODENOSCOPY N/A 06/02/2014   Procedure: ESOPHAGOGASTRODUODENOSCOPY (EGD);  Surgeon: Gordy CHRISTELLA Starch, MD;  Location: [facility] ENDOSCOPY;  Service: Endoscopy;  Laterality: N/A;   LEFT AND RIGHT HEART CATHETERIZATION WITH CORONARY ANGIOGRAM N/A 08/10/2014   Procedure: LEFT  AND RIGHT HEART CATHETERIZATION WITH CORONARY ANGIOGRAM;  Surgeon: Victory LELON Sharps, MD;  Location: [facility] CATH LAB;  Service: Cardiovascular;  Laterality: N/A;   EF 55%,  severe MR, severe pulm HTN,  40% LAD   MITRAL VALVE REPAIR  03-22-2008   via  right mini thoracotomy   MITRAL VALVE REPAIR N/A 08/11/2014   Procedure: REDO MITRAL VALVE (MV) REPLACEMENT;  Surgeon: Sudie VEAR Laine, MD;  Location: [facility] OR;  Service: Open Heart Surgery;  Laterality: N/A;  NO NECK LINES ON RIGHT SIDE   MULTIPLE TOOTH EXTRACTIONS  03-16-2008     w/ alveoloplasty   PROSTATE BIOPSY  03/2014   RADIOACTIVE SEED IMPLANT N/A 03/17/2015   Procedure: RADIOACTIVE SEED IMPLANT/BRACHYTHERAPY IMPLANT;  Surgeon: Morene LELON Salines, MD;  Location: [facility];  Service: Urology;  Laterality: N/A;  DR PORTABLE   TEE WITHOUT CARDIOVERSION N/A 07/19/2014   Procedure: TRANSESOPHAGEAL ECHOCARDIOGRAM (TEE);  Surgeon: Redell GORMAN Shallow, MD;  Location: [facility] ENDOSCOPY;  Service: Cardiovascular;  Laterality: N/A;   TEE WITHOUT CARDIOVERSION N/A 08/11/2014   Procedure: TRANSESOPHAGEAL ECHOCARDIOGRAM (TEE);  Surgeon: Sudie VEAR Laine, MD;  Location: [facility] OR;  Service: Open Heart Surgery;  Laterality: N/A;   TRANSTHORACIC ECHOCARDIOGRAM  09-23-2014   dr  shallow   mild focal basal LVH of the septum,  ef 45-50%,  normal wall motion/  mechanical bileaflet MV sits well in mitrial position, no sig. regurg., valve area 1.61 cm^2,  mean grandient 62mmHg/  mild LAE/  RA prominent Chiari malformation , unchanged from prior study, normal size/  mild TR/  mild to moderate pulmonic insuffiency     Home Medications:  Prior to Admission medications  Medication Sig Start Date End Date Taking? Authorizing Provider  aspirin  EC 81 MG EC tablet Take 1 tablet (81 mg total) by mouth daily. 08/17/14  Yes Barrett, Erin R, PA-C  carvedilol  (COREG ) 3.125 MG tablet TAKE 1 TABLET BY MOUTH TWICE A DAY WITH FOOD 03/03/24  Yes Shallow Redell GORMAN, MD  cephALEXin  (KEFLEX ) 500 MG capsule Take 1 capsule (500 mg total) by mouth 2 (two) times daily. 05/24/24  Yes [name], PA-C  ferrous sulfate  325 (65 FE) MG EC tablet Take 325 mg by mouth daily.   Yes [provider]  Multiple Vitamin (MULTIVITAMIN) tablet Take 2 tablets by mouth daily.   Yes [provider]  mupirocin ointment (BACTROBAN) 2 % Apply 1 Application topically 4 (four) times daily. Applying to nose 05/13/24  Yes [provider]  oxymetazoline  (AFRIN NASAL SPRAY) 0.05 % nasal spray Place 2 sprays  into both nostrils 2 (two) times daily as needed for congestion. As needed for nosebleeds Patient taking differently: Place 2 sprays into both nostrils daily as needed for congestion. As needed for nosebleeds 05/04/24  Yes Yolande Charleston BROCKS, MD  polyethylene glycol (MIRALAX ) 17 g packet Take 17 g by mouth daily. 05/04/24  Yes Yolande Charleston BROCKS, MD  VITAMIN D  PO Take 1 capsule by mouth daily.   Yes [provider]  warfarin (COUMADIN ) 5 MG tablet TAKE 1/2 TABLET TO 1 TABLET BY MOUTH DAILY OR AS DIRECTED BY ANTICOAGULATION CLINIC. Patient taking differently: Take 2.5-5 mg by mouth See admin instructions. Take 2.5 mg by mouth once daily on Monday, Wednesday, and Friday. Take 5 mg by mouth once daily on Tuesday, Thursday, Saturday, and Sunday. 04/30/24  Yes Shallow Redell GORMAN, MD    Scheduled Meds:  amoxicillin -clavulanate  1 tablet Oral Q12H   carvedilol   3.125 mg Oral  BID WC   feeding supplement  237 mL Oral BID BM   Warfarin - Pharmacist Dosing Inpatient   Does not apply q1600   Continuous Infusions:  heparin  1,150 Units/hr (06/03/24 1142)   PRN Meds: acetaminophen  **OR** acetaminophen , melatonin, sodium chloride , white petrolatum   Allergies:   Allergies[1]  Social History:   Social History   Socioeconomic History   Marital status: Married    Spouse name: Not on file   Number of children: 3   Years of education: 12   Highest education level: Not on file  Occupational History   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    Employer: [employer]    Comment: for [employer]  Tobacco Use   Smoking status: Former    Current packs/day: 0.00    Average packs/day: 2.0 packs/day for 15.0 years (30.0 ttl pk-yrs)    Types: Cigarettes    Start date: 08/09/1974    Quit date: 08/08/1989    Years since quitting: 34.8   Smokeless tobacco: Never  Vaping Use   Vaping status: Never Used  Substance and Sexual Activity   Alcohol use: No   Drug use: No    Types: Cocaine,  Heroin, Marijuana    Comment: stopped using drugs in ~ 1984   Sexual activity: Not on file  Other Topics Concern   Not on file  Social History Narrative   REGULAR EXERCISE 2X WK TREADMILL,WEIGHTS   Social Drivers of Health   Tobacco Use: Medium Risk (05/30/2024)   Patient History    Smoking Tobacco Use: Former    Smokeless Tobacco Use: Never    Passive Exposure: Not on Actuary Strain: Low Risk (05/10/2024)   Overall Financial Resource Strain (CARDIA)    Difficulty of Paying Living Expenses: Not hard at all  Food Insecurity: No Food Insecurity (05/31/2024)   Epic    Worried About Radiation Protection Practitioner of Food in the Last Year: Never true    Ran Out of Food in the Last Year: Never true  Transportation Needs: No Transportation Needs (05/31/2024)   Epic    Lack of Transportation (Medical): No    Lack of Transportation (Non-Medical): No  Physical Activity: Inactive (05/10/2024)   Exercise Vital Sign    Days of Exercise per Week: 0 days    Minutes of Exercise per Session: 0 min  Stress: No Stress Concern Present (05/10/2024)   [employer] of Occupational Health - Occupational Stress Questionnaire    Feeling of Stress: Not at all  Social Connections: Socially Integrated (05/10/2024)   Social Connection and Isolation Panel    Frequency of Communication with Friends and Family: More than three times a week    Frequency of Social Gatherings with Friends and Family: Once a week    Attends Religious Services: More than 4 times per year    Active Member of Clubs or Organizations: No    Attends Engineer, Structural: More than 4 times per year    Marital Status: Married  Catering Manager Violence: Not At Risk (05/31/2024)   Epic    Fear of Current or Ex-Partner: No    Emotionally Abused: No    Physically Abused: No    Sexually Abused: No  Depression (PHQ2-9): Low Risk (05/10/2024)   Depression (PHQ2-9)    PHQ-2 Score: 0  Alcohol Screen: Low Risk (05/10/2024)   Alcohol  Screen    Last Alcohol Screening Score (AUDIT): 0  Housing: Low Risk (05/31/2024)   Epic  Unable to Pay for Housing in the Last Year: No    Number of Times Moved in the Last Year: 0    Homeless in the Last Year: No  Utilities: Not At Risk (05/31/2024)   Epic    Threatened with loss of utilities: No  Health Literacy: Adequate Health Literacy (05/10/2024)   B1300 Health Literacy    Frequency of need for help with medical instructions: Never    Family History:    Family History  Problem Relation Age of Onset   Ovarian cancer Sister    Alzheimer's disease Father    Diabetes Neg Hx    Heart disease Neg Hx    Stroke Neg Hx      ROS:  Please see the history of present illness.   All other ROS reviewed and negative.     Physical Exam/Data: Vitals:   06/02/24 1307 06/02/24 2001 06/03/24 0439 06/03/24 1326  BP: (!) 122/57 118/63 126/75 127/78  Pulse: 85 84 84 84  Resp: 17 17 16 17   Temp: 99.8 F (37.7 C) 98.6 F (37 C) 97.9 F (36.6 C) 98.5 F (36.9 C)  TempSrc: Oral  Oral   SpO2: 100% 99% 100%   Weight:      Height:        Intake/Output Summary (Last 24 hours) at 06/03/2024 1428 Last data filed at 06/03/2024 0930 Gross per 24 hour  Intake 0 ml  Output 325 ml  Net -325 ml      05/31/2024    8:40 AM 05/23/2024    9:55 PM 05/10/2024    2:27 PM  Last 3 Weights  Weight (lbs) 140 lb 10.5 oz 151 lb 10.8 oz 151 lb 12.8 oz  Weight (kg) 63.8 kg 68.8 kg 68.856 kg     Body mass index is 19.9 kg/m.  General:  Well nourished, well developed, in no acute distress HEENT: Bilateral naris packed, slight red nasal drippage Neck: no JVD Vascular: No carotid bruits; Distal pulses 2+ bilaterally Cardiac: Mechanical heart sounds; RRR; II/VI systolic murmur LLSB, no rubs or gallops Lungs:  clear to auscultation bilaterally, no wheezing, rhonchi or rales  Abd: soft, nontender, no hepatomegaly  Ext: no edema Musculoskeletal:  No deformities, BUE and BLE strength normal and equal Skin:  warm and dry  Neuro:  CNs 2-12 intact, no focal abnormalities noted Psych:  Normal affect   EKG: No ECG Telemetry:  Telemetry was personally reviewed and demonstrates: NSR  Relevant CV Studies:   TTE 05/18/24:  IMPRESSIONS     1. Left ventricular ejection fraction, by estimation, is 40 to 45%. The  left ventricle has mildly decreased function. Wall motion due to LBBB.  There is mild concentric left ventricular hypertrophy. Left ventricular  diastolic function could not be  evaluated.   2. Right ventricular systolic function is mildly reduced. The right  ventricular size is normal. There is normal pulmonary artery systolic  pressure.   3. The mitral valve has been repaired/replaced. No evidence of mitral  valve regurgitation. No evidence of mitral stenosis. The mean mitral valve  gradient is 6.5 mmHg. There is a 31 mm Sorin Carbomedics optiform  bileaflet mechanical valve present in the  mitral position. Procedure Date: 07/2014. Echo findings are consistent with  normal structure and function of the mitral valve prosthesis.   4. The aortic valve is tricuspid. Aortic valve regurgitation is not  visualized. No aortic stenosis is present.   5. The inferior vena cava is normal in size with  greater than 50%  respiratory variability, suggesting right atrial pressure of 3 mmHg.   6. Large mobile structure seen in the RA, not adherent to the TV. Similar  appearance to previous echo, suspect Chiari network.   Laboratory Data: High Sensitivity Troponin:  No results for input(s): TROPONINIHS in the last 720 hours. No results for input(s): TRNPT in the last 720 hours.    Chemistry Recent Labs  Lab 05/31/24 0005 05/31/24 1717  NA 136 139  K 4.4 4.0  CL 102 103  CO2 25 24  GLUCOSE 129* 104*  BUN 21 18  CREATININE 0.74 0.78  CALCIUM  8.9 9.2  GFRNONAA >60 >60  ANIONGAP 9 12    No results for input(s): PROT, ALBUMIN , AST, ALT, ALKPHOS, BILITOT in the last 168  hours. Lipids No results for input(s): CHOL, TRIG, HDL, LABVLDL, LDLCALC, CHOLHDL in the last 168 hours.  Hematology Recent Labs  Lab 06/01/24 0415 06/02/24 0243 06/03/24 0417  WBC 6.8 6.3 5.2  RBC 3.55* 3.37* 3.18*  HGB 10.1* 9.8* 9.2*  HCT 31.7* 29.7* 28.1*  MCV 89.3 88.1 88.4  MCH 28.5 29.1 28.9  MCHC 31.9 33.0 32.7  RDW 15.7* 15.6* 15.4  PLT 89* 76* 70*   Thyroid  No results for input(s): TSH, FREET4 in the last 168 hours.  BNPNo results for input(s): BNP, PROBNP in the last 168 hours.  DDimer No results for input(s): DDIMER in the last 168 hours.  Radiology/Studies:  CT MAXILLOFACIAL W CONTRAST Result Date: 05/31/2024 EXAM: CT Facial Bones with contrast 05/31/2024 03:52:47 AM TECHNIQUE: CT of the facial bones was performed with the administration of 75 mL of iohexol  (OMNIPAQUE ) 300 MG/ML solution. Multiplanar reformatted images are provided for review. Automated exposure control, iterative reconstruction, and/or weight based adjustment of the mA/kV was utilized to reduce the radiation dose to as low as reasonably achievable. COMPARISON: None available CLINICAL HISTORY: recurring epistaxis with hemotympanum. FINDINGS: AERODIGESTIVE TRACT: No mass. No edema. SALIVARY GLANDS: No acute abnormality. LYMPH NODES: No suspicious cervical lymphadenopathy. SOFT TISSUES: No mass or fluid collection. BRAIN, ORBITS AND SINUSES: There is circumferential mucosal disease present within the floor of the left maxillary sinus. There is minimal mucosal thickening within the ethmoid air cells. The middle ear cavities and mastoid air cells are clear. No acute abnormality in the brain or orbits. BONES: The patient is missing several teeth including the medial maxillary incisors bilaterally and the right lateral incisor. There is caries and periapical lucency involving the left first maxillary premolar with associated erosion of the inner table of the left maxillary sinus. No suspicious bone  lesion. IMPRESSION: 1. Circumferential mucosal disease in the floor of the left maxillary sinus and minimal mucosal thickening in the ethmoid air cells. Middle ear cavities and mastoid air cells are clear. 2. Caries and periapical lucency involving the left first maxillary premolar with associated erosion of the inner table of the left maxilla. Electronically signed by: Evalene Coho MD 05/31/2024 04:05 AM EST RP Workstation: [id number]   CT Head W or Wo Contrast Result Date: 05/31/2024 EXAM: CT HEAD WITH AND WITHOUT CONTRAST 05/31/2024 03:52:47 AM TECHNIQUE: CT of the head was performed with and without the administration of intravenous contrast. Automated exposure control, iterative reconstruction, and/or weight based adjustment of the mA/kV was utilized to reduce the radiation dose to as low as reasonably achievable. COMPARISON: None available. CLINICAL HISTORY: Recurring epistaxis with hemotympanum. FINDINGS: BRAIN AND VENTRICLES: No acute intracranial hemorrhage. No mass effect or midline shift. No extra-axial fluid collection. No  evidence of acute infarct. No hydrocephalus. No abnormal enhancement. ORBITS: No acute abnormality. SINUSES AND MASTOIDS: No acute abnormality. SOFT TISSUES AND SKULL: No focal bone lesion. No acute soft tissue abnormality. IMPRESSION: 1. No acute intracranial abnormality. Electronically signed by: Evalene Coho MD 05/31/2024 04:00 AM EST RP Workstation: [id number]     Assessment and Plan:  LEYLAND KENNA is a 64 y.o. male with a hx of Infective Endocarditis s/p MV repair (2009) c/b Recurrent IE s/p mech MVR (2016) on Warfarin, HFmrEF, HLD and recurrent epistaxis who is being seen 06/03/2024 for the evaluation of supratherapeutic INR with recurrent bleeding at the request of Dr. Cherlyn.  #IE s/p MVR (2016) #Severe Recurrent Epistaxis #Acute on Chronic Anemia - Patient admitted with severe anemia in the setting of recurrent epistaxis and a supratherapeutic INR. -  Patient has mechanical mitral valve necessitating long-term anticoagulation with warfarin. - This is a challenging case because mechanical mitral valves are at great risk of thrombosing when INR's are subtherapeutic.  Per the 2020 valve guidelines, patients with a mechanical mitral valve should achieve an INR goal of 3; however, prior indurations guidelines recommended a range of INRs from 2.5-3.5.  His mitral valve was implanted in the area of old guidelines and he seemingly did fine.  I think it is a potentially reasonable option to target a lower INR goal given his recurrent bleeding.  He can also discontinue his baby aspirin  because this is not offering any additional antithrombotic benefit. - I will discuss a lower INR goal with the patient tomorrow and if he is agreeable to having a slightly higher thrombosis risk and lieu of reducing his bleeding risk, then I think this will be a reasonable alteration. - Stopping warfarin altogether is not a safe option. - Lastly, he has a significant murmur by exam so we will get a limited echo to evaluate his mitral valve and get a new baseline for his gradients prior to lowering his INR goal. INR goal per guidelines is 3, but I will discuss with the patient tomorrow about lowering his INR goal to 2.5 given his bleeding risk Stop aspirin  81 mg indefinitely Continue heparin  Continue warfarin per pharmacy Limited TTE to assess mitral valve gradients   Risk Assessment/Risk Scores:           For questions or updates, please contact [facility] Please consult [email or web address] for contact info under      Signed, Georganna Archer, MD  06/03/2024 2:28 PM      [1]  Allergies Allergen Reactions   Clarithromycin Nausea Only   "

## 2024-06-03 NOTE — Consult Note (Signed)
 Reason for Consult: Chronic epistaxis Referring Physician: Cherlyn Labella, MD  David [name] is an 64 y.o. male.  HPI: Patient well-known to me.  Long-term anticoagulation for mechanical valve.  Having chronic epistaxis.  Recently started bleeding from the left side.  He has had problems on both sides over the past couple of months.  He had a small inflatable anterior pack placed on the left but continues to bleed through that from both sides.  Past Medical History:  Diagnosis Date   Acute heart failure with mildly reduced ejection fraction (HFmrEF) (HCC) 05/31/2024   DDD (degenerative disc disease), lumbosacral    Hemolytic anemia 06/29/2014   History of bacterial endocarditis    07/ 2001  enterococcal//  recurrence  11/ 2009  steptococcus viridans   History of chest tube placement    1980's --- R SIDE  SPONTANEOUS  PNEUMOTHORAX   History of Clostridium difficile colitis    2009   History of periodontal disease    Prostate cancer [facility]) urologist--  dr herrick/  oncologist-- dr patrcia   Stage  T1c,  Gleason 3+4,  PSA 3.76,  vol 33.45cc   Pulmonary nodules followed by dr pietro   right upper & lower lobe and left lower lobe--  stable per CT 08-29-2014   S/P redo mitral valve replacement with metallic valve cardiologist--  dr pietro   08-11-2014--- 31 mm Sorin [employer] Optiform bileaflet mechanical prosthesis   Thoracic radiculopathy    T7  (positional)  per PCP note--  pt doing yoga and swimming state has helpted   Wears glasses    Wears partial dentures    upper    Past Surgical History:  Procedure Laterality Date   CARDIAC CATHETERIZATION  03-15-2008  dr morris   no sig. coronary disease/  normal LVF/  moderate pulmonary hypertension w/ elevated V wave   COLONOSCOPY  last one 12/ 2013   ESOPHAGOGASTRODUODENOSCOPY N/A 06/02/2014   Procedure: ESOPHAGOGASTRODUODENOSCOPY (EGD);  Surgeon: Gordy CHRISTELLA Starch, MD;  Location: [facility] ENDOSCOPY;  Service: Endoscopy;  Laterality: N/A;    LEFT AND RIGHT HEART CATHETERIZATION WITH CORONARY ANGIOGRAM N/A 08/10/2014   Procedure: LEFT AND RIGHT HEART CATHETERIZATION WITH CORONARY ANGIOGRAM;  Surgeon: Victory LELON Sharps, MD;  Location: [facility] CATH LAB;  Service: Cardiovascular;  Laterality: N/A;   EF 55%,  severe MR, severe pulm HTN,  40% LAD   MITRAL VALVE REPAIR  03-22-2008   via  right mini thoracotomy   MITRAL VALVE REPAIR N/A 08/11/2014   Procedure: REDO MITRAL VALVE (MV) REPLACEMENT;  Surgeon: Sudie VEAR Laine, MD;  Location: [facility] OR;  Service: Open Heart Surgery;  Laterality: N/A;  NO NECK LINES ON RIGHT SIDE   MULTIPLE TOOTH EXTRACTIONS  03-16-2008    w/ alveoloplasty   PROSTATE BIOPSY  03/2014   RADIOACTIVE SEED IMPLANT N/A 03/17/2015   Procedure: RADIOACTIVE SEED IMPLANT/BRACHYTHERAPY IMPLANT;  Surgeon: Morene LELON Salines, MD;  Location: [facility];  Service: Urology;  Laterality: N/A;  DR PORTABLE   TEE WITHOUT CARDIOVERSION N/A 07/19/2014   Procedure: TRANSESOPHAGEAL ECHOCARDIOGRAM (TEE);  Surgeon: Redell GORMAN [name], MD;  Location: [facility] ENDOSCOPY;  Service: Cardiovascular;  Laterality: N/A;   TEE WITHOUT CARDIOVERSION N/A 08/11/2014   Procedure: TRANSESOPHAGEAL ECHOCARDIOGRAM (TEE);  Surgeon: Sudie VEAR Laine, MD;  Location: [facility] OR;  Service: Open Heart Surgery;  Laterality: N/A;   TRANSTHORACIC ECHOCARDIOGRAM  09-23-2014   dr  pietro   mild focal basal LVH of the septum,  ef 45-50%,  normal wall motion/  mechanical bileaflet MV sits well in mitrial position, no sig. regurg., valve area 1.61 cm^2,  mean grandient 39mmHg/  mild LAE/  RA prominent Chiari malformation , unchanged from prior study, normal size/  mild TR/  mild to moderate pulmonic insuffiency    Family History  Problem Relation Age of Onset   Ovarian cancer Sister    Alzheimer's disease Father    Diabetes Neg Hx    Heart disease Neg Hx    Stroke Neg Hx     Social History:  reports that he quit smoking about 34 years ago. His smoking use included cigarettes.  He started smoking about 49 years ago. He has a 30 pack-year smoking history. He has never used smokeless tobacco. He reports that he does not drink alcohol and does not use drugs.  Allergies: Allergies[1]  Medications: Reviewed  Results for orders placed or performed during the hospital encounter of 05/30/24 (from the past 48 hours)  Heparin  level (unfractionated)     Status: Abnormal   Collection Time: 06/01/24  4:50 PM  Result Value Ref Range   Heparin  Unfractionated 0.25 (L) 0.30 - 0.70 IU/mL    Comment: (NOTE) The clinical reportable range upper limit is being lowered to >1.10 to align with the FDA approved guidance for the current laboratory assay.  If heparin  results are below expected values, and patient dosage has  been confirmed, suggest follow up testing of antithrombin III levels. Performed at [facility], [address] [place], [place] [address]   CBC     Status: Abnormal   Collection Time: 06/02/24  2:43 AM  Result Value Ref Range   WBC 6.3 4.0 - 10.5 K/uL   RBC 3.37 (L) 4.22 - 5.81 MIL/uL   Hemoglobin 9.8 (L) 13.0 - 17.0 g/dL   HCT 70.2 (L) 60.9 - 47.9 %   MCV 88.1 80.0 - 100.0 fL   MCH 29.1 26.0 - 34.0 pg   MCHC 33.0 30.0 - 36.0 g/dL   RDW 84.3 (H) 88.4 - 84.4 %   Platelets 76 (L) 150 - 400 K/uL    Comment: CONSISTENT WITH PREVIOUS RESULT REPEATED TO VERIFY Immature Platelet Fraction may be clinically indicated, consider ordering this additional test [id number]    nRBC 8.1 (H) 0.0 - 0.2 %    Comment: Performed at [facility], [address], [place], [place] [address]  Protime-INR     Status: Abnormal   Collection Time: 06/02/24  2:43 AM  Result Value Ref Range   Prothrombin Time 21.5 (H) 11.4 - 15.2 seconds   INR 1.8 (H) 0.8 - 1.2    Comment: (NOTE) INR goal varies based on device and disease states. Performed at [facility], [address], [place], [place] [address]   Heparin  level  (unfractionated)     Status: None   Collection Time: 06/02/24  2:43 AM  Result Value Ref Range   Heparin  Unfractionated 0.35 0.30 - 0.70 IU/mL    Comment: (NOTE) The clinical reportable range upper limit is being lowered to >1.10 to align with the FDA approved guidance for the current laboratory assay.  If heparin  results are below expected values, and patient dosage has  been confirmed, suggest follow up testing of antithrombin III levels. Performed at [facility], [address], [place], [place] [address]   Heparin  level (unfractionated)     Status: None   Collection Time: 06/02/24  9:39 AM  Result Value Ref Range  Heparin  Unfractionated 0.31 0.30 - 0.70 IU/mL    Comment: (NOTE) The clinical reportable range upper limit is being lowered to >1.10 to align with the FDA approved guidance for the current laboratory assay.  If heparin  results are below expected values, and patient dosage has  been confirmed, suggest follow up testing of antithrombin III levels. Performed at [facility], [address] [place], [place] [address]   CBC     Status: Abnormal   Collection Time: 06/03/24  4:17 AM  Result Value Ref Range   WBC 5.2 4.0 - 10.5 K/uL   RBC 3.18 (L) 4.22 - 5.81 MIL/uL   Hemoglobin 9.2 (L) 13.0 - 17.0 g/dL   HCT 71.8 (L) 60.9 - 47.9 %   MCV 88.4 80.0 - 100.0 fL   MCH 28.9 26.0 - 34.0 pg   MCHC 32.7 30.0 - 36.0 g/dL   RDW 84.5 88.4 - 84.4 %   Platelets 70 (L) 150 - 400 K/uL    Comment: CONSISTENT WITH PREVIOUS RESULT REPEATED TO VERIFY Immature Platelet Fraction may be clinically indicated, consider ordering this additional test [id number]    nRBC 7.7 (H) 0.0 - 0.2 %    Comment: Performed at [facility], [address], [place], [place] [address]  Protime-INR     Status: Abnormal   Collection Time: 06/03/24  4:17 AM  Result Value Ref Range   Prothrombin Time 24.6 (H) 11.4 - 15.2 seconds   INR 2.1 (H) 0.8  - 1.2    Comment: (NOTE) INR goal varies based on device and disease states. Performed at [facility], [address], [place], [place] [address]   Heparin  level (unfractionated)     Status: None   Collection Time: 06/03/24  4:17 AM  Result Value Ref Range   Heparin  Unfractionated 0.30 0.30 - 0.70 IU/mL    Comment: (NOTE) The clinical reportable range upper limit is being lowered to >1.10 to align with the FDA approved guidance for the current laboratory assay.  If heparin  results are below expected values, and patient dosage has  been confirmed, suggest follow up testing of antithrombin III levels. Performed at [facility], [address], [place], [place] [address]     No results found.  MND:Wzhjupcz except as listed in admit H&P  Blood pressure 126/75, pulse 84, temperature 97.9 F (36.6 C), temperature source Oral, resp. rate 16, height 5' 10.5 (1.791 m), weight 63.8 kg, SpO2 100%.  PHYSICAL EXAM: Overall appearance:  Healthy appearing, in no distress Head:  Normocephalic, atraumatic. Ears: External ears appear normal. Nose: External nose is healthy in appearance.  Small inflatable pack removed from the left side.  Topical Xylocaine /Afrin applied in spray form and cotton pledgets.  1% Xylocaine  with epinephrine  was then infiltrated into the septum and the inferior turbinates bilaterally.  Bilateral Merisel packs placed.  Inflated with local anesthetic solution.  No further bleeding.. Oral Cavity/Pharynx:  There are no mucosal lesions or masses identified. Larynx/Hypopharynx: Deferred Neuro:  No identifiable neurologic deficits. Neck: No palpable neck masses.  Studies Reviewed: none  Procedures: Bilateral nasal packing performed.   Assessment/Plan: Chronic epistaxis, chronic anticoagulation therapy.  Recommend keep he has bilateral packs in for 2 weeks and then we will reevaluate.  He may follow-up in our office in about 2 weeks  for packing removal.  If there is any possibility of stopping anticoagulation for couple of weeks that may afford us  the best chance of long-term control.  Unfortunately otherwise this is going to continue to be a problem.  Medical Decision Making: #/Complex Problems: 3  Data Reviewed:1  Management:3 (1-Straightforward, 2-Low, 3-Moderate, 4-High)   Ida Loader 06/03/2024, 12:42 PM         [1]  Allergies Allergen Reactions   Clarithromycin Nausea Only

## 2024-06-04 ENCOUNTER — Inpatient Hospital Stay (HOSPITAL_COMMUNITY)

## 2024-06-04 LAB — CBC
HCT: 28.6 % — ABNORMAL LOW (ref 39.0–52.0)
Hemoglobin: 9.3 g/dL — ABNORMAL LOW (ref 13.0–17.0)
MCH: 28.6 pg (ref 26.0–34.0)
MCHC: 32.5 g/dL (ref 30.0–36.0)
MCV: 88 fL (ref 80.0–100.0)
Platelets: 68 10*3/uL — ABNORMAL LOW (ref 150–400)
RBC: 3.25 MIL/uL — ABNORMAL LOW (ref 4.22–5.81)
RDW: 15 % (ref 11.5–15.5)
WBC: 5.2 10*3/uL (ref 4.0–10.5)
nRBC: 7.1 % — ABNORMAL HIGH (ref 0.0–0.2)

## 2024-06-04 LAB — BASIC METABOLIC PANEL WITH GFR
Anion gap: 12 (ref 5–15)
BUN: 11 mg/dL (ref 8–23)
CO2: 23 mmol/L (ref 22–32)
Calcium: 9 mg/dL (ref 8.9–10.3)
Chloride: 96 mmol/L — ABNORMAL LOW (ref 98–111)
Creatinine, Ser: 0.68 mg/dL (ref 0.61–1.24)
GFR, Estimated: >60 mL/min
Glucose, Bld: 113 mg/dL — ABNORMAL HIGH (ref 70–99)
Potassium: 4.3 mmol/L (ref 3.5–5.1)
Sodium: 131 mmol/L — ABNORMAL LOW (ref 135–145)

## 2024-06-04 LAB — ECHOCARDIOGRAM LIMITED
Area-P 1/2: 2.55 cm2
Height: 70.5 in
MV M vel: 5.97 m/s
MV Peak grad: 142.6 mmHg
S' Lateral: 3.7 cm
Weight: 2250.46 [oz_av]

## 2024-06-04 LAB — PROTIME-INR
INR: 2.3 — ABNORMAL HIGH (ref 0.8–1.2)
Prothrombin Time: 26.6 s — ABNORMAL HIGH (ref 11.4–15.2)

## 2024-06-04 LAB — HEPARIN INDUCED PLATELET AB (HIT ANTIBODY): Heparin Induced Plt Ab: 0.074 {OD_unit} (ref 0.000–0.400)

## 2024-06-04 LAB — HEPARIN LEVEL (UNFRACTIONATED)
Heparin Unfractionated: 0.29 [IU]/mL — ABNORMAL LOW (ref 0.30–0.70)
Heparin Unfractionated: 0.26 [IU]/mL — ABNORMAL LOW (ref 0.30–0.70)

## 2024-06-04 MED ORDER — WARFARIN SODIUM 5 MG PO TABS
5.0000 mg | ORAL_TABLET | Freq: Once | ORAL | Status: AC
  Administered 2024-06-04: 5 mg via ORAL
  Filled 2024-06-04: qty 1

## 2024-06-04 MED ORDER — FERROUS SULFATE 325 (65 FE) MG PO TABS
325.0000 mg | ORAL_TABLET | Freq: Every day | ORAL | Status: AC
  Administered 2024-06-04: 325 mg via ORAL
  Filled 2024-06-04: qty 1

## 2024-06-04 MED ORDER — WARFARIN SODIUM 2.5 MG PO TABS: 2.5000 mg | ORAL_TABLET | Freq: Once | ORAL | Status: DC

## 2024-06-04 MED ORDER — WARFARIN - PHARMACIST DOSING INPATIENT: Freq: Every day | Status: AC

## 2024-06-04 NOTE — Progress Notes (Addendum)
 PHARMACY - ANTICOAGULATION CONSULT NOTE  Pharmacy Consult for IV heparin /warfarin Indication: mechanical mitral valve replacement   Allergies[1]  Patient Measurements: Height: 5' 10.5 (179.1 cm) Weight: 63.8 kg (140 lb 10.5 oz) IBW/kg (Calculated) : 74.15 HEPARIN  DW (KG): 63.8  Vital Signs: Temp: 98.3 F (36.8 C) (02/06 0449) Temp Source: Oral (02/06 0449) BP: 119/65 (02/06 0449) Pulse Rate: 86 (02/06 0449)  Labs: Recent Labs    06/02/24 0243 06/02/24 0939 06/03/24 0417 06/04/24 0417  HGB 9.8*  --  9.2* 9.3*  HCT 29.7*  --  28.1* 28.6*  PLT 76*  --  70* 68*  LABPROT 21.5*  --  24.6* 26.6*  INR 1.8*  --  2.1* 2.3*  HEPARINUNFRC 0.35 0.31 0.30 0.29*  CREATININE  --   --   --  0.68    Estimated Creatinine Clearance: 84.2 mL/min (by C-G formula based on SCr of 0.68 mg/dL).   Medical History: Past Medical History:  Diagnosis Date   Acute heart failure with mildly reduced ejection fraction (HFmrEF) (HCC) 05/31/2024   DDD (degenerative disc disease), lumbosacral    Hemolytic anemia 06/29/2014   History of bacterial endocarditis    07/ 2001  enterococcal//  recurrence  11/ 2009  steptococcus viridans   History of chest tube placement    1980's --- R SIDE  SPONTANEOUS  PNEUMOTHORAX   History of Clostridium difficile colitis    2009   History of periodontal disease    Prostate cancer [facility]) urologist--  dr herrick/  oncologist-- dr patrcia   Stage  T1c,  Gleason 3+4,  PSA 3.76,  vol 33.45cc   Pulmonary nodules followed by dr pietro   right upper & lower lobe and left lower lobe--  stable per CT 08-29-2014   S/P redo mitral valve replacement with metallic valve cardiologist--  dr pietro   08-11-2014--- 31 mm Sorin [employer]omed[employer] Optiform bileaflet mechanical prosthesis   Thoracic radiculopathy    T7  (positional)  per PCP note--  pt doing yoga and swimming state has helpted   Wears glasses    Wears partial dentures    upper    Medications:  Medications Prior to  Admission  Medication Sig Dispense Refill Last Dose/Taking   aspirin  EC 81 MG EC tablet Take 1 tablet (81 mg total) by mouth daily.   05/30/2024   carvedilol  (COREG ) 3.125 MG tablet TAKE 1 TABLET BY MOUTH TWICE A DAY WITH FOOD 60 tablet 0 05/30/2024   cephALEXin  (KEFLEX ) 500 MG capsule Take 1 capsule (500 mg total) by mouth 2 (two) times daily. 14 capsule 0 05/30/2024   ferrous sulfate  325 (65 FE) MG EC tablet Take 325 mg by mouth daily.   05/29/2024   Multiple Vitamin (MULTIVITAMIN) tablet Take 2 tablets by mouth daily.   05/30/2024   mupirocin ointment (BACTROBAN) 2 % Apply 1 Application topically 4 (four) times daily. Applying to nose   05/30/2024   oxymetazoline  (AFRIN NASAL SPRAY) 0.05 % nasal spray Place 2 sprays into both nostrils 2 (two) times daily as needed for congestion. As needed for nosebleeds (Patient taking differently: Place 2 sprays into both nostrils daily as needed for congestion. As needed for nosebleeds) 30 mL 0 05/30/2024   polyethylene glycol (MIRALAX ) 17 g packet Take 17 g by mouth daily. 14 each 0 05/30/2024   VITAMIN D  PO Take 1 capsule by mouth daily.   Past Week   warfarin (COUMADIN ) 5 MG tablet TAKE 1/2 TABLET TO 1 TABLET BY MOUTH DAILY OR AS DIRECTED  BY ANTICOAGULATION CLINIC. (Patient taking differently: Take 2.5-5 mg by mouth See admin instructions. Take 2.5 mg by mouth once daily on Monday, Wednesday, and Friday. Take 5 mg by mouth once daily on Tuesday, Thursday, Saturday, and Sunday.) 90 tablet 1 05/29/2024    Assessment: 63 yoM s/p mechanical mitral valve replacement maintained on warfarin, presented with epistaxis since mid Dec. Found to be anemic with supratherapeutic INR 02/02. Pharmacy to dose IV heparin  once INR falls below 2.5.   Baseline INR: 6.0 upon admission Baseline aPTT: not recorded Prior anticoagulation: asa 81 mg daily, warfarin 2.5 mg MWF, 5 mg all other days   Significant events: Warfarin resumed 06/01/2024 Warfarin paused 06/03/24 AM due to bleeding noted  on [employer], resumed 06/04/24  Today, 06/04/2024: Heparin  level 0.29, subtherapeutic on 1150 units/hr INR 2.3 --> below goal of 2.5, trending up  Hgb stable around 9.3, plts 68 -> trending down New packing for nose, some scant bleeding out of same nostril noted per RN, not as significant as bleeding sx noted 02/05 ENT consulted, states pt remains a bleed risk on long-term anticoagulation Cards consulted, and to prevent bleed risk: -Discussed with patient and family to adjust INR goal due to bleed risk -Recommends d/c asa 81 mg indefinitely PO intake inconsistent for past 2 days DDI: none major, but broad spectrum abx can increase warfarin sensitivity   Goal of Therapy: Goal INR 2.5-3.0 per cards consult Heparin  level 0.3-0.7 units/ml Monitor platelets by anticoagulation protocol: Yes  Plan: 5 mg warfarin x 1 today Increase IV heparin  to 1200 units/hr IV infusion to maintain heparin  level Check heparin  level in 6 hours Daily CBC Check daily INR  Monitor for signs of bleeding or thrombosis  Thank you for allowing pharmacy to be a part of this patients care.  Gudrun Axe, Student-PharmD 06/04/2024, 9:55 AM            [1]  Allergies Allergen Reactions   Clarithromycin Nausea Only

## 2024-06-04 NOTE — Progress Notes (Addendum)
 Pharmacy Brief Note - Evening Anticoagulation Follow Up:  Pt is a 32 yoM currently on heparin  drip for subtherapeutic INR and history of mechanical mitral valve. Pt has had epistaxis during admission. For full history, see note by Bard Jeans, PharmD from earlier today.   Assessment: Heparin  level = 0.26 is subtherapeutic/trending down despite increasing rate of  heparin  infusion to 1200 units/hr Discussed with RN - epistaxis about the same, not worsening.   Goal heparin  level: 0.3 - 0.7 units/mL  Plan: Hold off on bolus given epistaxis Increase rate of heparin  infusion to 1350 units/hr  Check heparin  level in 6-8 hours CBC, heparin  level, INR daily.  Monitor for signs of bleeding, worsening epistaxis.   Ronal CHRISTELLA Rav, PharmD 06/04/24 1:52 PM

## 2024-06-04 NOTE — Progress Notes (Signed)
 "        [facility]                                                                               Sota Hetz, is a 64 y.o. male, DOB - 12-Apr-1961, FMW:[id number] Admit date - 05/30/2024    Outpatient Primary MD for the patient is [name], FNP  LOS - 3  days    Brief summary    64 year old man PMH including bacterial endocarditis, status post mechanical mitral valve replacement maintained on warfarin, presented with chronic epistaxis from nares since mid December. Found to be markedly anemic with supratherapeutic INR. INR treated with FFP and vitamin K , Rhino Rocket placed left nare, EDP spoke with ENT who recommended outpatient follow-up for epistaxis and hemotympanum.    Assessment & Plan    Assessment and Plan:  Severe symptomatic anemia sec to sub acute blood loss Intermittent epistaxis Bilateral hemotympanum. In the setting of chronic long-term anticoagulation and supratherapeutic INR Acute blood loss anemia from the above Patient underwent Rhino Rocket placement, will need to be removed in 3 to 4 days and recommend outpatient follow-up with ENT Hemoglobin dropped to 5.7 on admission and he was given FFP and vitamin K  in the ED and  3 units of PRBC transfusion. Hemoglobin improved to 10.2 to 9.8 and continues to drop to 9.2 with continuous oozing from the [employer] as per the RN.   Patient was started on heparin  and coumadin  on 2/3 , held it on 2/5 when he had recurrent epistaxis .  Requested hematology consult spoke to Dr Gatha to see if the IV heparin  needs to be changed to bivalirudin or argotroban as his platelets continue to drop.  ENT consulted and patient underwent bilateral nasal packing performed.  Hematology deferred the consult, suggested getting cardiology input regarding anti coagulation. Cardiology suggested to reduce the risk of bleeding, and also maintain adequate anti coagulation to reduce hi risk of thrombosis, plan to lower the INR goal to  2.5 to 3.  Holding aspirin  as per cardiology recommendations.  Monitor daily INR.      History of mechanical mitral valve with chronic long-term anticoagulation, supratherapeutic INR on admission. Goal INR is between 2.5-3.5 Started the patient on IV heparin  Monitor INR daily    Recent thrombocytopenia Possibly from consumption vs HIT Check HIT.     Dental caries Complete the course of Augmentin     Estimated body mass index is 19.9 kg/m as calculated from the following:   Height as of this encounter: 5' 10.5 (1.791 m).   Weight as of this encounter: 63.8 kg.  Code Status: full code.  DVT Prophylaxis:  SCDs Start: 05/31/24 0558 warfarin (COUMADIN ) tablet 5 mg   Level of Care: Level of care: Progressive Family Communication: Updated patient  Disposition Plan:     Remains inpatient appropriate:  pending.   Procedures:  None.   Consultants:   Hematology oncology Cardiology ENT   Antimicrobials:   Anti-infectives (From admission, onward)    Start     Dose/Rate Route Frequency Ordered Stop   05/31/24 1800  amoxicillin -clavulanate (AUGMENTIN ) 875-125 MG per tablet 1 tablet  1 tablet Oral Every 12 hours 05/31/24 1647     05/31/24 1200  Ampicillin -Sulbactam (UNASYN ) 3 g in sodium chloride  0.9 % 100 mL IVPB  Status:  Discontinued        3 g 200 mL/hr over 30 Minutes Intravenous Every 6 hours 05/31/24 0605 05/31/24 1647   05/31/24 0430  ampicillin -sulbactam (UNASYN ) 1.5 g in sodium chloride  0.9 % 100 mL IVPB        1.5 g 200 mL/hr over 30 Minutes Intravenous  Once 05/31/24 0428 05/31/24 9394        Medications  Scheduled Meds:  amoxicillin -clavulanate  1 tablet Oral Q12H   carvedilol   3.125 mg Oral BID WC   feeding supplement  237 mL Oral BID BM   ferrous sulfate   325 mg Oral Q breakfast   warfarin  5 mg Oral ONCE-1600   Warfarin - Pharmacist Dosing Inpatient   Does not apply q1600   Continuous Infusions:  heparin  1,200 Units/hr (06/04/24 1056)    PRN Meds:.acetaminophen  **OR** acetaminophen , melatonin, sodium chloride , white petrolatum     Subjective:   Glennis Montenegro was seen and examined today.  No oozing today.   Objective:   Vitals:   06/03/24 1326 06/03/24 2005 06/04/24 0449 06/04/24 1404  BP: 127/78 121/61 119/65 122/71  Pulse: 84 87 86 91  Resp: 17 20 20 19   Temp: 98.5 F (36.9 C) 98.8 F (37.1 C) 98.3 F (36.8 C) 98.9 F (37.2 C)  TempSrc:  Oral Oral Oral  SpO2:  100% 100% 100%  Weight:      Height:        Intake/Output Summary (Last 24 hours) at 06/04/2024 1541 Last data filed at 06/04/2024 1500 Gross per 24 hour  Intake 1129.39 ml  Output 500 ml  Net 629.39 ml   Filed Weights   05/31/24 0840  Weight: 63.8 kg     Exam  General exam: Appears calm and comfortable  Respiratory system: Clear to auscultation. Respiratory effort normal. Cardiovascular system: S1 & S2 heard, RRR. No JVD,  Gastrointestinal system: Abdomen is nondistended, soft and nontender.  Central nervous system: Alert and oriented.  Extremities: no pedal edema.  Skin: No rashes,  Psychiatry: Mood & affect appropriate.     Data Reviewed:  I have personally reviewed following labs and imaging studies   CBC Lab Results  Component Value Date   WBC 5.2 06/04/2024   RBC 3.25 (L) 06/04/2024   HGB 9.3 (L) 06/04/2024   HCT 28.6 (L) 06/04/2024   MCV 88.0 06/04/2024   MCH 28.6 06/04/2024   PLT 68 (L) 06/04/2024   MCHC 32.5 06/04/2024   RDW 15.0 06/04/2024   LYMPHSABS 1.5 05/31/2024   MONOABS 0.3 05/31/2024   EOSABS 0.1 05/31/2024   BASOSABS 0.1 05/31/2024     Last metabolic panel Lab Results  Component Value Date   NA 131 (L) 06/04/2024   K 4.3 06/04/2024   CL 96 (L) 06/04/2024   CO2 23 06/04/2024   BUN 11 06/04/2024   CREATININE 0.68 06/04/2024   GLUCOSE 113 (H) 06/04/2024   GFRNONAA >60 06/04/2024   GFRAA >60 09/09/2017   CALCIUM  9.0 06/04/2024   PROT 6.8 05/10/2024   ALBUMIN  4.4 05/10/2024   LABGLOB 2.4  04/26/2024   BILITOT 0.3 05/10/2024   ALKPHOS 752 (H) 05/10/2024   AST 35 05/10/2024   ALT 17 05/10/2024   ANIONGAP 12 06/04/2024    CBG (last 3)  No results for input(s): GLUCAP in the last 72 hours.  Coagulation Profile: Recent Labs  Lab 05/31/24 0802 06/01/24 0415 06/02/24 0243 06/03/24 0417 06/04/24 0417  INR 2.6* 1.9* 1.8* 2.1* 2.3*     Radiology Studies: ECHOCARDIOGRAM LIMITED Result Date: 06/04/2024    ECHOCARDIOGRAM LIMITED REPORT   Patient Name:   GEAROLD WAINER Date of Exam: 06/04/2024 Medical Rec #:  [id number]       Height:       70.5 in Accession #:    [id number]      Weight:       140.7 lb Date of Birth:  March 15, 1961       BSA:          1.807 m Patient Age:    63 years        BP:           119/65 mmHg Patient Gender: M               HR:           85 bpm. Exam Location:  Inpatient Procedure: Limited Echo, Cardiac Doppler and Color Doppler (Both Spectral and            Color Flow Doppler were utilized during procedure). Indications:    Mitral Valve Disorder 105.9  History:        Patient has prior history of Echocardiogram examinations, most                 recent 05/18/2024. Risk Factors:Dyslipidemia and Former Smoker.                  Mitral Valve: 31 mm [employer] optiform bileaflet                 mechanical valve valve is present in the mitral position.                 Procedure Date: 07/2014.  Sonographer:    Merlynn Argyle Referring Phys: [id number] GEORGANNA ARCHER IMPRESSIONS  1. Left ventricular ejection fraction, by estimation, is 45 to 50%. The left ventricle has mildly decreased function. The left ventricle demonstrates regional wall motion abnormalities. Abnormal (paradoxical) septal motion, consistent with left bundle branch block. There is mild left ventricular hypertrophy. Left ventricular diastolic parameters are indeterminate.  2. Right ventricular systolic function is normal. The right ventricular size is normal. Tricuspid regurgitation signal is inadequate for  assessing PA pressure.  3. The mitral valve has been repaired/replaced. Trivial mitral valve regurgitation. The mean mitral valve gradient is 9.0 mmHg with average heart rate of 83 bpm. There is a 31 mm [employer] optiform bileaflet mechanical valve present in the mitral position. Procedure Date: 07/2014.  4. The inferior vena cava is normal in size with greater than 50% respiratory variability, suggesting right atrial pressure of 3 mmHg. FINDINGS  Left Ventricle: Left ventricular ejection fraction, by estimation, is 45 to 50%. The left ventricle has mildly decreased function. The left ventricle demonstrates regional wall motion abnormalities. The left ventricular internal cavity size was normal in size. There is mild left ventricular hypertrophy. Abnormal (paradoxical) septal motion, consistent with left bundle branch block. Left ventricular diastolic parameters are indeterminate. Right Ventricle: The right ventricular size is normal. No increase in right ventricular wall thickness. Right ventricular systolic function is normal. Tricuspid regurgitation signal is inadequate for assessing PA pressure. Pericardium: There is no evidence of pericardial effusion. Mitral Valve: The mitral valve has been repaired/replaced. Trivial mitral valve regurgitation. There is a 31 mm [employer] optiform bileaflet mechanical valve  present in the mitral position. Procedure Date: 07/2014. MV peak gradient, 16.9 mmHg. The  mean mitral valve gradient is 9.0 mmHg with average heart rate of 83 bpm. Venous: The inferior vena cava is normal in size with greater than 50% respiratory variability, suggesting right atrial pressure of 3 mmHg. LEFT VENTRICLE PLAX 2D LVIDd:         4.60 cm Diastology LVIDs:         3.70 cm LV e' medial:    6.53 cm/s LV PW:         1.20 cm LV E/e' medial:  18.7 LV IVS:        1.20 cm LV e' lateral:   6.64 cm/s                        LV E/e' lateral: 18.4  RIGHT VENTRICLE             IVC RV S prime:      12.70 cm/s  IVC diam: 1.60 cm TAPSE (M-mode): 1.6 cm MITRAL VALVE MV Area (PHT): 2.55 cm MV Peak grad:  16.9 mmHg MV Mean grad:  9.0 mmHg MV Vmax:       2.06 m/s MV Vmean:      138.3 cm/s MV Decel Time: 298 msec MR Peak grad: 142.6 mmHg MR Mean grad: 86.0 mmHg MR Vmax:      597.00 cm/s MR Vmean:     429.0 cm/s MV E velocity: 122.00 cm/s MV A velocity: 190.00 cm/s MV E/A ratio:  0.64 Lonni Nanas MD Electronically signed by Lonni Nanas MD Signature Date/Time: 06/04/2024/11:49:36 AM    Final        Elgie Butter M.D. [facility] 06/04/2024, 3:41 PM  Available via Epic secure chat 7am-7pm After 7 pm, please refer to night coverage provider listed on amion.    "

## 2024-06-04 NOTE — Progress Notes (Signed)
 Echocardiogram 2D Echocardiogram has been performed.  David [name] 06/04/2024, 10:24 AM

## 2024-06-04 NOTE — Progress Notes (Signed)
 "  Rounding Note   Patient Name: David [name] Date of Encounter: 06/04/2024  [facility] Cardiologist: Redell Shallow, MD   Subjective - No acute events overnight - No more bleeding  Scheduled Meds:  amoxicillin -clavulanate  1 tablet Oral Q12H   carvedilol   3.125 mg Oral BID WC   feeding supplement  237 mL Oral BID BM   ferrous sulfate   325 mg Oral Q breakfast   warfarin  2.5 mg Oral ONCE-1600   Warfarin - Pharmacist Dosing Inpatient   Does not apply q1600   Continuous Infusions:  heparin  1,150 Units/hr (06/04/24 0414)   PRN Meds: acetaminophen  **OR** acetaminophen , melatonin, sodium chloride , white petrolatum    Vital Signs  Vitals:   06/03/24 0439 06/03/24 1326 06/03/24 2005 06/04/24 0449  BP: 126/75 127/78 121/61 119/65  Pulse: 84 84 87 86  Resp: 16 17 20 20   Temp: 97.9 F (36.6 C) 98.5 F (36.9 C) 98.8 F (37.1 C) 98.3 F (36.8 C)  TempSrc: Oral  Oral Oral  SpO2: 100%  100% 100%  Weight:      Height:        Intake/Output Summary (Last 24 hours) at 06/04/2024 1004 Last data filed at 06/04/2024 0414 Gross per 24 hour  Intake 1238.2 ml  Output 500 ml  Net 738.2 ml      05/31/2024    8:40 AM 05/23/2024    9:55 PM 05/10/2024    2:27 PM  Last 3 Weights  Weight (lbs) 140 lb 10.5 oz 151 lb 10.8 oz 151 lb 12.8 oz  Weight (kg) 63.8 kg 68.8 kg 68.856 kg      Telemetry Not on telemetry  ECG  No new ECG  Physical Exam  General:  Well nourished, well developed, in no acute distress HEENT: Bilateral naris packed Neck: no JVD Vascular: No carotid bruits; Distal pulses 2+ bilaterally Cardiac: Mechanical heart sounds; RRR; II/VI systolic murmur LLSB, no rubs or gallops Lungs:  clear to auscultation bilaterally, no wheezing, rhonchi or rales  Abd: soft, nontender, no hepatomegaly  Ext: no edema Musculoskeletal:  No deformities, BUE and BLE strength normal and equal Skin: warm and dry  Neuro:  CNs 2-12 intact, no focal abnormalities noted Psych:   Normal affect   Labs High Sensitivity Troponin:  No results for input(s): TROPONINIHS in the last 720 hours. No results for input(s): TRNPT in the last 720 hours.     Chemistry Recent Labs  Lab 05/31/24 0005 05/31/24 1717 06/04/24 0417  NA 136 139 131*  K 4.4 4.0 4.3  CL 102 103 96*  CO2 25 24 23   GLUCOSE 129* 104* 113*  BUN 21 18 11   CREATININE 0.74 0.78 0.68  CALCIUM  8.9 9.2 9.0  GFRNONAA >60 >60 >60  ANIONGAP 9 12 12     Lipids No results for input(s): CHOL, TRIG, HDL, LABVLDL, LDLCALC, CHOLHDL in the last 168 hours.  Hematology Recent Labs  Lab 06/02/24 0243 06/03/24 0417 06/03/24 1622 06/04/24 0417  WBC 6.3 5.2  --  5.2  RBC 3.37* 3.18* 3.10* 3.25*  HGB 9.8* 9.2*  --  9.3*  HCT 29.7* 28.1*  --  28.6*  MCV 88.1 88.4  --  88.0  MCH 29.1 28.9  --  28.6  MCHC 33.0 32.7  --  32.5  RDW 15.6* 15.4  --  15.0  PLT 76* 70*  --  68*   Thyroid  No results for input(s): TSH, FREET4 in the last 168 hours.  BNPNo results for input(s): BNP,  PROBNP in the last 168 hours.  DDimer No results for input(s): DDIMER in the last 168 hours.   Radiology  No results found.  Cardiac Studies TTE pending  Patient Profile   JSHAUN ABERNATHY is a 64 y.o. male with a hx of Infective Endocarditis s/p MV repair (2009) c/b Recurrent IE s/p mech MVR (2016) on Warfarin, HFmrEF, HLD and recurrent epistaxis who is being seen 06/03/2024 for the evaluation of supratherapeutic INR with recurrent bleeding at the request of Dr. Cherlyn.   Assessment & Plan   #IE s/p MVR (2016) #Severe Recurrent Epistaxis #Acute on Chronic Anemia - Patient admitted with severe anemia in the setting of recurrent epistaxis and a supratherapeutic INR. - Patient has mechanical mitral valve necessitating long-term anticoagulation with warfarin. - This is a challenging case because mechanical mitral valves are at great risk of thrombosing when INR's are subtherapeutic.  Per the 2020 valve guidelines,  patients with a mechanical mitral valve should achieve an INR goal of 3; however, prior indurations guidelines recommended a range of INRs from 2.5-3.5.  His mitral valve was implanted in the area of old guidelines and he seemingly did fine. - I had a great conversation with the patient and his wife and we engaged in shared decision making.  Ultimately to reduce his risk of bleeding we will also maintaining adequate anticoagulation to reduce his risk of thrombosis, we will lower his INR goal to 2.5 with an acceptable range being 2.5-3.  Both the patient and I are in agreement that this is probably the best way to reduce his bleeding risk we will also giving him some protection against thrombosis. Reduce INR goal to 2.5-3 Stop aspirin  81 mg indefinitely Continue heparin  until INR is within the new therapeutic range Continue warfarin per pharmacy Limited TTE to assess mitral valve gradients Daily INR     For questions or updates, please contact [facility] Please consult [email or web address] for contact info under       Signed, Georganna Archer, MD  06/04/2024, 10:04 AM    "

## 2024-06-07 ENCOUNTER — Ambulatory Visit

## 2024-06-22 ENCOUNTER — Inpatient Hospital Stay

## 2024-08-09 ENCOUNTER — Encounter (HOSPITAL_BASED_OUTPATIENT_CLINIC_OR_DEPARTMENT_OTHER)
